# Patient Record
Sex: Female | Born: 1935
Health system: Southern US, Community
[De-identification: ages and names within clinical notes are randomized; demographics above are authoritative.]

## PROBLEM LIST (undated history)

## (undated) DIAGNOSIS — M8580 Other specified disorders of bone density and structure, unspecified site: Secondary | ICD-10-CM

## (undated) DIAGNOSIS — F329 Major depressive disorder, single episode, unspecified: Secondary | ICD-10-CM

## (undated) DIAGNOSIS — K56609 Unspecified intestinal obstruction, unspecified as to partial versus complete obstruction: Secondary | ICD-10-CM

## (undated) DIAGNOSIS — N281 Cyst of kidney, acquired: Secondary | ICD-10-CM

## (undated) DIAGNOSIS — R32 Unspecified urinary incontinence: Secondary | ICD-10-CM

## (undated) DIAGNOSIS — R413 Other amnesia: Secondary | ICD-10-CM

## (undated) DIAGNOSIS — K219 Gastro-esophageal reflux disease without esophagitis: Secondary | ICD-10-CM

## (undated) DIAGNOSIS — J449 Chronic obstructive pulmonary disease, unspecified: Secondary | ICD-10-CM

## (undated) DIAGNOSIS — F32A Depression, unspecified: Secondary | ICD-10-CM

## (undated) DIAGNOSIS — H3530 Unspecified macular degeneration: Secondary | ICD-10-CM

## (undated) DIAGNOSIS — G4700 Insomnia, unspecified: Secondary | ICD-10-CM

## (undated) DIAGNOSIS — G500 Trigeminal neuralgia: Secondary | ICD-10-CM

## (undated) DIAGNOSIS — H269 Unspecified cataract: Secondary | ICD-10-CM

## (undated) DIAGNOSIS — M1990 Unspecified osteoarthritis, unspecified site: Secondary | ICD-10-CM

## (undated) DIAGNOSIS — D126 Benign neoplasm of colon, unspecified: Secondary | ICD-10-CM

## (undated) DIAGNOSIS — M719 Bursopathy, unspecified: Secondary | ICD-10-CM

## (undated) DIAGNOSIS — N3090 Cystitis, unspecified without hematuria: Secondary | ICD-10-CM

## (undated) DIAGNOSIS — T7840XA Allergy, unspecified, initial encounter: Secondary | ICD-10-CM

## (undated) DIAGNOSIS — R001 Bradycardia, unspecified: Secondary | ICD-10-CM

## (undated) DIAGNOSIS — K2970 Gastritis, unspecified, without bleeding: Secondary | ICD-10-CM

## (undated) DIAGNOSIS — E162 Hypoglycemia, unspecified: Secondary | ICD-10-CM

## (undated) DIAGNOSIS — R0602 Shortness of breath: Secondary | ICD-10-CM

## (undated) DIAGNOSIS — Z972 Presence of dental prosthetic device (complete) (partial): Secondary | ICD-10-CM

## (undated) HISTORY — DX: Unspecified osteoarthritis, unspecified site: M19.90

## (undated) HISTORY — DX: Cyst of kidney, acquired: N28.1

## (undated) HISTORY — DX: Benign neoplasm of colon, unspecified: D12.6

## (undated) HISTORY — PX: CHOLECYSTECTOMY: SHX55

## (undated) HISTORY — DX: Gastro-esophageal reflux disease without esophagitis: K21.9

## (undated) HISTORY — DX: Trigeminal neuralgia: G50.0

## (undated) HISTORY — PX: TONSILLECTOMY: SHX5217

## (undated) HISTORY — DX: Depression, unspecified: F32.A

## (undated) HISTORY — DX: Unspecified intestinal obstruction, unspecified as to partial versus complete obstruction: K56.609

## (undated) HISTORY — DX: Major depressive disorder, single episode, unspecified: F32.9

## (undated) HISTORY — DX: Chronic obstructive pulmonary disease, unspecified: J44.9

## (undated) HISTORY — PX: CATARACT EXTRACTION: SUR2

## (undated) HISTORY — DX: Insomnia, unspecified: G47.00

## (undated) HISTORY — PX: ABDOMINAL HYSTERECTOMY: SHX81

## (undated) HISTORY — PX: BREAST SURGERY: SHX581

## (undated) HISTORY — PX: DILATION AND CURETTAGE OF UTERUS: SHX78

## (undated) HISTORY — DX: Allergy, unspecified, initial encounter: T78.40XA

## (undated) HISTORY — PX: JOINT REPLACEMENT: SHX530

## (undated) HISTORY — DX: Gastritis, unspecified, without bleeding: K29.70

## (undated) HISTORY — DX: Unspecified urinary incontinence: R32

## (undated) HISTORY — DX: Unspecified macular degeneration: H35.30

## (undated) HISTORY — PX: EYE SURGERY: SHX253

---

## 1990-04-25 HISTORY — PX: ABDOMINAL HYSTERECTOMY: SHX81

## 1992-04-25 HISTORY — PX: CHOLECYSTECTOMY: SHX55

## 1995-04-26 DIAGNOSIS — D126 Benign neoplasm of colon, unspecified: Secondary | ICD-10-CM

## 1995-04-26 HISTORY — DX: Benign neoplasm of colon, unspecified: D12.6

## 1995-04-26 HISTORY — PX: PARTIAL COLECTOMY: SHX5273

## 1995-05-18 ENCOUNTER — Encounter: Payer: Self-pay | Admitting: Internal Medicine

## 1998-05-28 ENCOUNTER — Other Ambulatory Visit: Admission: RE | Admit: 1998-05-28 | Discharge: 1998-05-28 | Payer: Self-pay | Admitting: Internal Medicine

## 1999-03-05 ENCOUNTER — Inpatient Hospital Stay (HOSPITAL_COMMUNITY): Admission: EM | Admit: 1999-03-05 | Discharge: 1999-03-08 | Payer: Self-pay | Admitting: Emergency Medicine

## 1999-03-05 ENCOUNTER — Encounter: Payer: Self-pay | Admitting: Emergency Medicine

## 1999-06-08 ENCOUNTER — Ambulatory Visit: Admission: RE | Admit: 1999-06-08 | Discharge: 1999-06-08 | Payer: Self-pay | Admitting: Family Medicine

## 2000-03-26 ENCOUNTER — Encounter: Payer: Self-pay | Admitting: Internal Medicine

## 2000-03-26 ENCOUNTER — Inpatient Hospital Stay (HOSPITAL_COMMUNITY): Admission: EM | Admit: 2000-03-26 | Discharge: 2000-03-28 | Payer: Self-pay | Admitting: Internal Medicine

## 2000-03-27 ENCOUNTER — Encounter: Payer: Self-pay | Admitting: Internal Medicine

## 2000-05-23 ENCOUNTER — Encounter: Payer: Self-pay | Admitting: Internal Medicine

## 2000-05-25 ENCOUNTER — Encounter: Payer: Self-pay | Admitting: Internal Medicine

## 2000-05-25 ENCOUNTER — Ambulatory Visit (HOSPITAL_COMMUNITY): Admission: RE | Admit: 2000-05-25 | Discharge: 2000-05-25 | Payer: Self-pay | Admitting: Internal Medicine

## 2002-07-02 ENCOUNTER — Encounter (INDEPENDENT_AMBULATORY_CARE_PROVIDER_SITE_OTHER): Payer: Self-pay | Admitting: *Deleted

## 2002-07-02 ENCOUNTER — Encounter: Admission: RE | Admit: 2002-07-02 | Discharge: 2002-07-02 | Payer: Self-pay | Admitting: General Surgery

## 2002-07-02 ENCOUNTER — Encounter: Payer: Self-pay | Admitting: General Surgery

## 2002-07-15 ENCOUNTER — Encounter: Payer: Self-pay | Admitting: General Surgery

## 2002-07-15 ENCOUNTER — Encounter (INDEPENDENT_AMBULATORY_CARE_PROVIDER_SITE_OTHER): Payer: Self-pay | Admitting: Specialist

## 2002-07-15 ENCOUNTER — Encounter: Admission: RE | Admit: 2002-07-15 | Discharge: 2002-07-15 | Payer: Self-pay | Admitting: General Surgery

## 2003-04-26 HISTORY — PX: JOINT REPLACEMENT: SHX530

## 2003-05-20 ENCOUNTER — Encounter: Payer: Self-pay | Admitting: Internal Medicine

## 2003-06-09 ENCOUNTER — Encounter: Admission: RE | Admit: 2003-06-09 | Discharge: 2003-06-09 | Payer: Self-pay | Admitting: Family Medicine

## 2003-07-23 ENCOUNTER — Encounter: Admission: RE | Admit: 2003-07-23 | Discharge: 2003-07-23 | Payer: Self-pay | Admitting: Family Medicine

## 2003-09-03 ENCOUNTER — Inpatient Hospital Stay (HOSPITAL_COMMUNITY): Admission: RE | Admit: 2003-09-03 | Discharge: 2003-09-08 | Payer: Self-pay | Admitting: Orthopedic Surgery

## 2003-09-08 ENCOUNTER — Inpatient Hospital Stay (HOSPITAL_COMMUNITY)
Admission: RE | Admit: 2003-09-08 | Discharge: 2003-09-12 | Payer: Self-pay | Admitting: Physical Medicine & Rehabilitation

## 2004-03-30 ENCOUNTER — Ambulatory Visit: Payer: Self-pay | Admitting: Family Medicine

## 2004-07-05 ENCOUNTER — Ambulatory Visit: Payer: Self-pay | Admitting: Family Medicine

## 2004-07-08 ENCOUNTER — Ambulatory Visit: Payer: Self-pay | Admitting: Family Medicine

## 2004-08-17 ENCOUNTER — Ambulatory Visit (HOSPITAL_COMMUNITY): Admission: RE | Admit: 2004-08-17 | Discharge: 2004-08-17 | Payer: Self-pay | Admitting: Family Medicine

## 2004-12-14 ENCOUNTER — Encounter: Admission: RE | Admit: 2004-12-14 | Discharge: 2004-12-14 | Payer: Self-pay | Admitting: Family Medicine

## 2004-12-14 ENCOUNTER — Ambulatory Visit: Payer: Self-pay | Admitting: Family Medicine

## 2005-05-16 ENCOUNTER — Ambulatory Visit: Payer: Self-pay | Admitting: Internal Medicine

## 2005-05-30 ENCOUNTER — Ambulatory Visit: Payer: Self-pay | Admitting: Internal Medicine

## 2005-05-30 ENCOUNTER — Encounter: Payer: Self-pay | Admitting: Family Medicine

## 2005-08-22 ENCOUNTER — Ambulatory Visit: Payer: Self-pay | Admitting: Family Medicine

## 2005-08-25 ENCOUNTER — Encounter: Admission: RE | Admit: 2005-08-25 | Discharge: 2005-08-25 | Payer: Self-pay | Admitting: Family Medicine

## 2005-08-26 ENCOUNTER — Ambulatory Visit: Payer: Self-pay | Admitting: Family Medicine

## 2005-09-02 ENCOUNTER — Ambulatory Visit: Payer: Self-pay | Admitting: Family Medicine

## 2005-10-11 ENCOUNTER — Encounter: Admission: RE | Admit: 2005-10-11 | Discharge: 2005-10-11 | Payer: Self-pay | Admitting: Family Medicine

## 2005-10-11 ENCOUNTER — Ambulatory Visit: Payer: Self-pay | Admitting: Family Medicine

## 2005-11-16 ENCOUNTER — Ambulatory Visit: Payer: Self-pay | Admitting: Family Medicine

## 2005-11-24 ENCOUNTER — Encounter: Admission: RE | Admit: 2005-11-24 | Discharge: 2005-11-24 | Payer: Self-pay | Admitting: Family Medicine

## 2006-01-12 ENCOUNTER — Ambulatory Visit: Payer: Self-pay | Admitting: Family Medicine

## 2006-01-13 ENCOUNTER — Ambulatory Visit: Payer: Self-pay | Admitting: Family Medicine

## 2006-01-16 ENCOUNTER — Ambulatory Visit: Payer: Self-pay | Admitting: Family Medicine

## 2006-02-16 ENCOUNTER — Ambulatory Visit: Payer: Self-pay | Admitting: Family Medicine

## 2006-04-12 ENCOUNTER — Ambulatory Visit: Payer: Self-pay | Admitting: Internal Medicine

## 2006-04-16 ENCOUNTER — Emergency Department (HOSPITAL_COMMUNITY): Admission: EM | Admit: 2006-04-16 | Discharge: 2006-04-16 | Payer: Self-pay | Admitting: Emergency Medicine

## 2006-09-04 ENCOUNTER — Ambulatory Visit: Payer: Self-pay | Admitting: Family Medicine

## 2006-09-04 LAB — CONVERTED CEMR LAB
ALT: 22 units/L (ref 0–40)
AST: 24 units/L (ref 0–37)
Albumin: 4 g/dL (ref 3.5–5.2)
Alkaline Phosphatase: 64 units/L (ref 39–117)
BUN: 19 mg/dL (ref 6–23)
Basophils Absolute: 0.1 10*3/uL (ref 0.0–0.1)
Basophils Relative: 1 % (ref 0.0–1.0)
Bilirubin, Direct: 0.1 mg/dL (ref 0.0–0.3)
CO2: 28 meq/L (ref 19–32)
Calcium: 9.8 mg/dL (ref 8.4–10.5)
Chloride: 111 meq/L (ref 96–112)
Cholesterol: 275 mg/dL (ref 0–200)
Creatinine, Ser: 1 mg/dL (ref 0.4–1.2)
Direct LDL: 129.2 mg/dL
Eosinophils Absolute: 0.2 10*3/uL (ref 0.0–0.6)
Eosinophils Relative: 2.1 % (ref 0.0–5.0)
GFR calc Af Amer: 69 mL/min
GFR calc non Af Amer: 57 mL/min
Glucose, Bld: 100 mg/dL — ABNORMAL HIGH (ref 70–99)
HCT: 37.4 % (ref 36.0–46.0)
HDL: 79 mg/dL (ref >39.0)
Hemoglobin: 12.8 g/dL (ref 12.0–15.0)
Lymphocytes Relative: 26 % (ref 12.0–46.0)
MCHC: 34.2 g/dL (ref 30.0–36.0)
MCV: 90.8 fL (ref 78.0–100.0)
Monocytes Absolute: 0.5 10*3/uL (ref 0.2–0.7)
Monocytes Relative: 6.5 % (ref 3.0–11.0)
Neutro Abs: 4.8 10*3/uL (ref 1.4–7.7)
Neutrophils Relative %: 64.4 % (ref 43.0–77.0)
Platelets: 396 10*3/uL (ref 150–400)
Potassium: 4 meq/L (ref 3.5–5.1)
RBC: 4.11 M/uL (ref 3.87–5.11)
RDW: 12.4 % (ref 11.5–14.6)
Sodium: 147 meq/L — ABNORMAL HIGH (ref 135–145)
TSH: 2.51 microintl units/mL (ref 0.35–5.50)
Total Bilirubin: 0.7 mg/dL (ref 0.3–1.2)
Total CHOL/HDL Ratio: 3.5
Total Protein: 7.5 g/dL (ref 6.0–8.3)
Triglycerides: 208 mg/dL (ref 0–149)
VLDL: 42 mg/dL — ABNORMAL HIGH (ref 0–40)
WBC: 7.5 10*3/uL (ref 4.5–10.5)

## 2006-10-03 ENCOUNTER — Encounter: Payer: Self-pay | Admitting: Family Medicine

## 2006-10-03 ENCOUNTER — Encounter: Admission: RE | Admit: 2006-10-03 | Discharge: 2006-10-03 | Payer: Self-pay | Admitting: Family Medicine

## 2006-10-09 ENCOUNTER — Ambulatory Visit: Payer: Self-pay | Admitting: Family Medicine

## 2006-12-21 ENCOUNTER — Ambulatory Visit: Payer: Self-pay | Admitting: Family Medicine

## 2007-06-18 ENCOUNTER — Telehealth: Payer: Self-pay | Admitting: Family Medicine

## 2007-06-19 ENCOUNTER — Telehealth: Payer: Self-pay | Admitting: Family Medicine

## 2007-09-03 ENCOUNTER — Ambulatory Visit: Payer: Self-pay | Admitting: Family Medicine

## 2007-09-03 DIAGNOSIS — J449 Chronic obstructive pulmonary disease, unspecified: Secondary | ICD-10-CM | POA: Insufficient documentation

## 2007-09-03 DIAGNOSIS — F329 Major depressive disorder, single episode, unspecified: Secondary | ICD-10-CM

## 2007-09-03 DIAGNOSIS — K219 Gastro-esophageal reflux disease without esophagitis: Secondary | ICD-10-CM | POA: Insufficient documentation

## 2007-09-03 DIAGNOSIS — M1990 Unspecified osteoarthritis, unspecified site: Secondary | ICD-10-CM

## 2007-09-03 DIAGNOSIS — J309 Allergic rhinitis, unspecified: Secondary | ICD-10-CM

## 2007-09-03 LAB — CONVERTED CEMR LAB
AST: 20 units/L (ref 0–37)
Alkaline Phosphatase: 60 units/L (ref 39–117)
Basophils Absolute: 0.1 10*3/uL (ref 0.0–0.1)
Chloride: 110 meq/L (ref 96–112)
Eosinophils Absolute: 0.2 10*3/uL (ref 0.0–0.7)
Eosinophils Relative: 2 % (ref 0.0–5.0)
GFR calc non Af Amer: 51 mL/min
HDL: 84 mg/dL (ref >39.0)
MCHC: 33.7 g/dL (ref 30.0–36.0)
MCV: 92.4 fL (ref 78.0–100.0)
Neutrophils Relative %: 62.7 % (ref 43.0–77.0)
Platelets: 397 10*3/uL (ref 150–400)
Potassium: 4.4 meq/L (ref 3.5–5.1)
RDW: 13.2 % (ref 11.5–14.6)
Sodium: 142 meq/L (ref 135–145)
Total Bilirubin: 0.9 mg/dL (ref 0.3–1.2)
Triglycerides: 138 mg/dL (ref 0–149)
WBC: 8.5 10*3/uL (ref 4.5–10.5)

## 2007-09-24 ENCOUNTER — Telehealth: Payer: Self-pay | Admitting: Family Medicine

## 2007-09-27 ENCOUNTER — Encounter: Payer: Self-pay | Admitting: *Deleted

## 2007-09-27 ENCOUNTER — Telehealth: Payer: Self-pay | Admitting: *Deleted

## 2007-10-31 ENCOUNTER — Encounter: Admission: RE | Admit: 2007-10-31 | Discharge: 2007-10-31 | Payer: Self-pay | Admitting: Orthopaedic Surgery

## 2007-11-12 ENCOUNTER — Telehealth: Payer: Self-pay | Admitting: Family Medicine

## 2007-11-21 ENCOUNTER — Telehealth: Payer: Self-pay | Admitting: Family Medicine

## 2008-01-07 DIAGNOSIS — G500 Trigeminal neuralgia: Secondary | ICD-10-CM | POA: Insufficient documentation

## 2008-01-10 ENCOUNTER — Ambulatory Visit: Payer: Self-pay | Admitting: Family Medicine

## 2008-02-23 ENCOUNTER — Ambulatory Visit: Payer: Self-pay | Admitting: Family Medicine

## 2008-02-23 ENCOUNTER — Telehealth: Payer: Self-pay | Admitting: Family Medicine

## 2008-02-23 DIAGNOSIS — N390 Urinary tract infection, site not specified: Secondary | ICD-10-CM

## 2008-02-23 LAB — CONVERTED CEMR LAB
Bilirubin Urine: NEGATIVE
Glucose, Urine, Semiquant: NEGATIVE
Ketones, urine, test strip: NEGATIVE
Protein, U semiquant: 100
Specific Gravity, Urine: 1.015
Urobilinogen, UA: 0.2

## 2008-02-27 ENCOUNTER — Telehealth: Payer: Self-pay | Admitting: Family Medicine

## 2008-03-10 ENCOUNTER — Telehealth: Payer: Self-pay | Admitting: Internal Medicine

## 2008-03-13 ENCOUNTER — Encounter: Admission: RE | Admit: 2008-03-13 | Discharge: 2008-03-13 | Payer: Self-pay | Admitting: Neurosurgery

## 2008-03-31 ENCOUNTER — Telehealth: Payer: Self-pay | Admitting: *Deleted

## 2008-04-01 ENCOUNTER — Telehealth: Payer: Self-pay | Admitting: Family Medicine

## 2008-05-20 ENCOUNTER — Inpatient Hospital Stay (HOSPITAL_COMMUNITY): Admission: RE | Admit: 2008-05-20 | Discharge: 2008-05-22 | Payer: Self-pay | Admitting: Neurosurgery

## 2008-05-20 HISTORY — PX: BACK SURGERY: SHX140

## 2008-05-29 ENCOUNTER — Emergency Department (HOSPITAL_COMMUNITY): Admission: EM | Admit: 2008-05-29 | Discharge: 2008-05-29 | Payer: Self-pay | Admitting: *Deleted

## 2008-07-09 ENCOUNTER — Encounter: Admission: RE | Admit: 2008-07-09 | Discharge: 2008-07-09 | Payer: Self-pay | Admitting: Neurosurgery

## 2008-07-14 ENCOUNTER — Telehealth: Payer: Self-pay | Admitting: Family Medicine

## 2008-07-17 ENCOUNTER — Encounter (INDEPENDENT_AMBULATORY_CARE_PROVIDER_SITE_OTHER): Payer: Self-pay | Admitting: *Deleted

## 2008-07-17 ENCOUNTER — Emergency Department (HOSPITAL_COMMUNITY): Admission: EM | Admit: 2008-07-17 | Discharge: 2008-07-17 | Payer: Self-pay | Admitting: Emergency Medicine

## 2008-07-20 ENCOUNTER — Emergency Department (HOSPITAL_COMMUNITY): Admission: EM | Admit: 2008-07-20 | Discharge: 2008-07-20 | Payer: Self-pay | Admitting: Emergency Medicine

## 2008-07-20 ENCOUNTER — Encounter (INDEPENDENT_AMBULATORY_CARE_PROVIDER_SITE_OTHER): Payer: Self-pay | Admitting: *Deleted

## 2008-07-21 ENCOUNTER — Telehealth: Payer: Self-pay | Admitting: Internal Medicine

## 2008-07-22 ENCOUNTER — Ambulatory Visit (HOSPITAL_COMMUNITY): Admission: RE | Admit: 2008-07-22 | Discharge: 2008-07-22 | Payer: Self-pay | Admitting: Internal Medicine

## 2008-07-22 DIAGNOSIS — K5900 Constipation, unspecified: Secondary | ICD-10-CM

## 2008-08-07 DIAGNOSIS — H3530 Unspecified macular degeneration: Secondary | ICD-10-CM | POA: Insufficient documentation

## 2008-08-07 DIAGNOSIS — N281 Cyst of kidney, acquired: Secondary | ICD-10-CM | POA: Insufficient documentation

## 2008-08-07 DIAGNOSIS — Z8719 Personal history of other diseases of the digestive system: Secondary | ICD-10-CM

## 2008-08-07 DIAGNOSIS — M129 Arthropathy, unspecified: Secondary | ICD-10-CM | POA: Insufficient documentation

## 2008-08-07 DIAGNOSIS — H409 Unspecified glaucoma: Secondary | ICD-10-CM

## 2008-08-07 DIAGNOSIS — Z85038 Personal history of other malignant neoplasm of large intestine: Secondary | ICD-10-CM | POA: Insufficient documentation

## 2008-08-07 DIAGNOSIS — G4700 Insomnia, unspecified: Secondary | ICD-10-CM | POA: Insufficient documentation

## 2008-08-11 ENCOUNTER — Ambulatory Visit: Payer: Self-pay | Admitting: Internal Medicine

## 2008-10-09 ENCOUNTER — Ambulatory Visit: Payer: Self-pay | Admitting: Family Medicine

## 2008-10-09 LAB — CONVERTED CEMR LAB
ALT: 14 units/L (ref 0–35)
AST: 22 units/L (ref 0–37)
Albumin: 3.8 g/dL (ref 3.5–5.2)
Alkaline Phosphatase: 64 units/L (ref 39–117)
Basophils Relative: 0.9 % (ref 0.0–3.0)
Bilirubin Urine: NEGATIVE
Bilirubin, Direct: 0.1 mg/dL (ref 0.0–0.3)
Blood in Urine, dipstick: NEGATIVE
CO2: 26 meq/L (ref 19–32)
Calcium: 9.6 mg/dL (ref 8.4–10.5)
Creatinine, Ser: 1 mg/dL (ref 0.4–1.2)
Eosinophils Absolute: 0.2 10*3/uL (ref 0.0–0.7)
Eosinophils Relative: 1.6 % (ref 0.0–5.0)
Glucose, Urine, Semiquant: NEGATIVE
Hemoglobin: 12.5 g/dL (ref 12.0–15.0)
Lymphocytes Relative: 23.4 % (ref 12.0–46.0)
MCHC: 34.1 g/dL (ref 30.0–36.0)
Monocytes Relative: 6.7 % (ref 3.0–12.0)
Neutro Abs: 6.7 10*3/uL (ref 1.4–7.7)
Neutrophils Relative %: 67.4 % (ref 43.0–77.0)
RBC: 4.08 M/uL (ref 3.87–5.11)
Sodium: 142 meq/L (ref 135–145)
Specific Gravity, Urine: 1.025
Total Protein: 7.3 g/dL (ref 6.0–8.3)
WBC: 10 10*3/uL (ref 4.5–10.5)
pH: 5.5

## 2008-12-24 HISTORY — PX: SHOULDER SURGERY: SHX246

## 2009-01-22 HISTORY — PX: SHOULDER SURGERY: SHX246

## 2009-01-23 ENCOUNTER — Inpatient Hospital Stay (HOSPITAL_COMMUNITY): Admission: RE | Admit: 2009-01-23 | Discharge: 2009-01-25 | Payer: Self-pay | Admitting: Orthopedic Surgery

## 2009-02-16 ENCOUNTER — Ambulatory Visit: Payer: Self-pay | Admitting: Internal Medicine

## 2009-03-05 ENCOUNTER — Ambulatory Visit: Payer: Self-pay | Admitting: Family Medicine

## 2009-03-06 ENCOUNTER — Telehealth: Payer: Self-pay | Admitting: Gastroenterology

## 2009-03-09 ENCOUNTER — Telehealth: Payer: Self-pay | Admitting: Family Medicine

## 2009-03-09 ENCOUNTER — Ambulatory Visit: Payer: Self-pay | Admitting: Gastroenterology

## 2009-03-09 ENCOUNTER — Encounter: Payer: Self-pay | Admitting: Internal Medicine

## 2009-03-09 DIAGNOSIS — Z860100 Personal history of colon polyps, unspecified: Secondary | ICD-10-CM | POA: Insufficient documentation

## 2009-03-09 DIAGNOSIS — R110 Nausea: Secondary | ICD-10-CM

## 2009-03-09 DIAGNOSIS — R634 Abnormal weight loss: Secondary | ICD-10-CM | POA: Insufficient documentation

## 2009-03-09 LAB — CONVERTED CEMR LAB
AST: 19 units/L (ref 0–37)
Amylase: 85 units/L (ref 27–131)
BUN: 17 mg/dL (ref 6–23)
Basophils Absolute: 0.1 10*3/uL (ref 0.0–0.1)
Bilirubin, Direct: 0 mg/dL (ref 0.0–0.3)
Calcium: 10.1 mg/dL (ref 8.4–10.5)
Creatinine, Ser: 1 mg/dL (ref 0.4–1.2)
Eosinophils Relative: 0.9 % (ref 0.0–5.0)
GFR calc non Af Amer: 56.54 mL/min (ref >60)
Glucose, Bld: 95 mg/dL (ref 70–99)
H Pylori IgG: NEGATIVE
HCT: 38.8 % (ref 36.0–46.0)
Lymphs Abs: 2.2 10*3/uL (ref 0.7–4.0)
Monocytes Absolute: 0.5 10*3/uL (ref 0.1–1.0)
Monocytes Relative: 5.1 % (ref 3.0–12.0)
Neutrophils Relative %: 72 % (ref 43.0–77.0)
Platelets: 364 10*3/uL (ref 150.0–400.0)
Potassium: 4.3 meq/L (ref 3.5–5.1)
RDW: 12.9 % (ref 11.5–14.6)
TSH: 2.3 microintl units/mL (ref 0.35–5.50)
Total Bilirubin: 0.5 mg/dL (ref 0.3–1.2)
WBC: 10.5 10*3/uL (ref 4.5–10.5)

## 2009-03-10 ENCOUNTER — Ambulatory Visit: Payer: Self-pay | Admitting: Internal Medicine

## 2009-03-24 ENCOUNTER — Ambulatory Visit: Payer: Self-pay | Admitting: Internal Medicine

## 2010-02-08 ENCOUNTER — Encounter: Admission: RE | Admit: 2010-02-08 | Discharge: 2010-02-08 | Payer: Self-pay | Admitting: Orthopedic Surgery

## 2010-02-19 ENCOUNTER — Ambulatory Visit: Payer: Self-pay | Admitting: Family Medicine

## 2010-05-25 NOTE — Assessment & Plan Note (Signed)
Summary: flu shot/cjr/PT RESCD//CCM   Allergies: 1)  ! Phenergan 2)  ! Codeine 3)  ! Sulfa 4)  ! * Tizandine 5)  ! * Lyrica  Review of Systems       Flu Vaccine Consent Questions     Do you have a history of severe allergic reactions to this vaccine? no    Any prior history of allergic reactions to egg and/or gelatin? no    Do you have a sensitivity to the preservative Thimersol? no    Do you have a past history of Guillan-Barre Syndrome? no    Do you currently have an acute febrile illness? no    Have you ever had a severe reaction to latex? no    Vaccine information given and explained to patient? yes    Are you currently pregnant? no    Lot Number:[id number]   Exp Date:10/23/2010   Site Given  Left Deltoid IM    Complete Medication List: 1)  Lovastatin 10 Mg Tabs (Lovastatin) .... Once daily 2)  Qvar 80 Mcg/act Aers (Beclomethasone dipropionate) .... Two times a day 3)  Premarin 0.625 Mg/gm Crea (Estrogens, conjugated) .... Apply once daily 4)  Retin-a 0.025 % Crea (Tretinoin) .... Use every 3rd day 5)  Travatan 0.004 % Soln (Travoprost) .... One drop once daily 6)  Azopt 1 % Susp (Brinzolamide) .... One two times a day 7)  Tretinoin 0.05 % Crea (Tretinoin) .... Apply a small amount to affected area at bedtime 8)  Prilosec 20 Mg Cpdr (Omeprazole) .Marland Kitchen.. 1 by mouth once daily 9)  Oxytrol 3.9 Mg/24hr Pttw (Oxybutynin) .Marland Kitchen.. 1 patch every 3rd day 10)  Neurontin 100 Mg Caps (Gabapentin) .Marland Kitchen.. 1 once daily 11)  Lexapro 10 Mg Tabs (Escitalopram oxalate) .... One daily 12)  Ambien 10 Mg Tabs (Zolpidem tartrate) .... Take one tab at bedtime 13)  Gaviscon 80-14.2 Mg Chew (Alum hydroxide-mag trisilicate) .... Chew one by mouth as needed 14)  Megace Oral 40 Mg/ml Susp (Megestrol acetate) .... Take 5 cc (1 teaspoon) by mouth once daily  Other Orders: Flu Vaccine 52yrs + MEDICARE PATIENTS [id number]) Administration Flu vaccine - MCR (G0008)   Orders Added: 1)  Flu Vaccine 60yrs + MEDICARE  PATIENTS [Q2039] 2)  Administration Flu vaccine - MCR [id number]

## 2010-06-09 ENCOUNTER — Encounter: Payer: Self-pay | Admitting: Internal Medicine

## 2010-06-16 NOTE — Letter (Signed)
Summary: Colonoscopy Letter  [facility]  [place], [place] [address]   Phone: [phone]  Fax: [phone]      June 09, 2010 MRN: [id number]   [name], [place]  [id number]   Dear [name],   According to your medical record, it is time for you to schedule a Colonoscopy. The American Cancer Society recommends this procedure as a method to detect early colon cancer. Patients with a family history of colon cancer, or a personal history of colon polyps or inflammatory bowel disease are at increased risk.  This letter has been generated based on the recommendations made at the time of your procedure. If you feel that in your particular situation this may no longer apply, please contact our office.  Please call our office at [phone] to schedule this appointment or to update your records at your earliest convenience.  Thank you for cooperating with [place] to provide you with the very best care possible.   Sincerely,  Lowella Bandy. Olevia Perches, M.D.  [facility] [phone]

## 2010-07-26 ENCOUNTER — Other Ambulatory Visit: Payer: Self-pay | Admitting: Family Medicine

## 2010-07-30 LAB — URINE MICROSCOPIC-ADD ON

## 2010-07-30 LAB — COMPREHENSIVE METABOLIC PANEL
ALT: 18 U/L (ref 0–35)
AST: 24 U/L (ref 0–37)
Albumin: 4 g/dL (ref 3.5–5.2)
Alkaline Phosphatase: 67 U/L (ref 39–117)
CO2: 26 mEq/L (ref 19–32)
Chloride: 105 mEq/L (ref 96–112)
GFR calc Af Amer: >60 mL/min (ref >60)
GFR calc non Af Amer: 54 mL/min — ABNORMAL LOW (ref >60)
Potassium: 4.5 mEq/L (ref 3.5–5.1)
Total Bilirubin: 0.7 mg/dL (ref 0.3–1.2)

## 2010-07-30 LAB — URINALYSIS, ROUTINE W REFLEX MICROSCOPIC
Bilirubin Urine: NEGATIVE
Glucose, UA: NEGATIVE mg/dL
Hgb urine dipstick: NEGATIVE
Nitrite: NEGATIVE
Specific Gravity, Urine: 1.021 (ref 1.005–1.030)
pH: 6 (ref 5.0–8.0)

## 2010-07-30 LAB — CBC
Platelets: 347 10*3/uL (ref 150–400)
RBC: 4.25 MIL/uL (ref 3.87–5.11)
WBC: 8.5 10*3/uL (ref 4.0–10.5)

## 2010-08-05 LAB — CBC
HCT: 40.5 % (ref 36.0–46.0)
HCT: 42.1 % (ref 36.0–46.0)
Hemoglobin: 13.2 g/dL (ref 12.0–15.0)
MCV: 89.8 fL (ref 78.0–100.0)
Platelets: 270 10*3/uL (ref 150–400)
RDW: 14.8 % (ref 11.5–15.5)
RDW: 15.2 % (ref 11.5–15.5)

## 2010-08-05 LAB — DIFFERENTIAL
Basophils Absolute: 0.1 10*3/uL (ref 0.0–0.1)
Basophils Absolute: 0.1 10*3/uL (ref 0.0–0.1)
Basophils Relative: 1 % (ref 0–1)
Basophils Relative: 1 % (ref 0–1)
Eosinophils Absolute: 0 10*3/uL (ref 0.0–0.7)
Eosinophils Relative: 0 % (ref 0–5)
Lymphocytes Relative: 22 % (ref 12–46)
Neutro Abs: 10.2 10*3/uL — ABNORMAL HIGH (ref 1.7–7.7)
Neutrophils Relative %: 74 % (ref 43–77)

## 2010-08-05 LAB — URINALYSIS, ROUTINE W REFLEX MICROSCOPIC
Hgb urine dipstick: NEGATIVE
Nitrite: NEGATIVE
Protein, ur: NEGATIVE mg/dL
Protein, ur: NEGATIVE mg/dL
Urobilinogen, UA: 0.2 mg/dL (ref 0.0–1.0)
Urobilinogen, UA: 0.2 mg/dL (ref 0.0–1.0)

## 2010-08-05 LAB — COMPREHENSIVE METABOLIC PANEL
Alkaline Phosphatase: 58 U/L (ref 39–117)
BUN: 22 mg/dL (ref 6–23)
Chloride: 104 mEq/L (ref 96–112)
Glucose, Bld: 102 mg/dL — ABNORMAL HIGH (ref 70–99)
Potassium: 3.8 mEq/L (ref 3.5–5.1)
Total Bilirubin: 0.7 mg/dL (ref 0.3–1.2)

## 2010-08-05 LAB — BASIC METABOLIC PANEL
BUN: 18 mg/dL (ref 6–23)
GFR calc non Af Amer: 54 mL/min — ABNORMAL LOW (ref >60)
Glucose, Bld: 94 mg/dL (ref 70–99)
Potassium: 4.4 mEq/L (ref 3.5–5.1)

## 2010-08-05 LAB — URINE CULTURE

## 2010-08-05 LAB — URINE MICROSCOPIC-ADD ON

## 2010-08-09 LAB — CBC
Platelets: 374 10*3/uL (ref 150–400)
WBC: 8.3 10*3/uL (ref 4.0–10.5)

## 2010-08-09 LAB — TYPE AND SCREEN: ABO/RH(D): A POS

## 2010-08-09 LAB — ABO/RH: ABO/RH(D): A POS

## 2010-08-10 LAB — URINALYSIS, ROUTINE W REFLEX MICROSCOPIC
Bilirubin Urine: NEGATIVE
Glucose, UA: NEGATIVE mg/dL
Hgb urine dipstick: NEGATIVE
Ketones, ur: 15 mg/dL — AB
Nitrite: NEGATIVE
Protein, ur: NEGATIVE mg/dL
Specific Gravity, Urine: 1.02 (ref 1.005–1.030)
Urobilinogen, UA: 0.2 mg/dL (ref 0.0–1.0)
pH: 7.5 (ref 5.0–8.0)

## 2010-08-10 LAB — DIFFERENTIAL
Basophils Absolute: 0.1 10*3/uL (ref 0.0–0.1)
Basophils Relative: 1 % (ref 0–1)
Eosinophils Relative: 1 % (ref 0–5)
Lymphocytes Relative: 14 % (ref 12–46)
Neutro Abs: 6.8 10*3/uL (ref 1.7–7.7)

## 2010-08-10 LAB — URINE MICROSCOPIC-ADD ON

## 2010-08-10 LAB — POCT I-STAT, CHEM 8
BUN: 15 mg/dL (ref 6–23)
Calcium, Ion: 1.11 mmol/L — ABNORMAL LOW (ref 1.12–1.32)
Chloride: 105 meq/L (ref 96–112)
Creatinine, Ser: 0.9 mg/dL (ref 0.4–1.2)
Glucose, Bld: 115 mg/dL — ABNORMAL HIGH (ref 70–99)
HCT: 36 % (ref 36.0–46.0)
Hemoglobin: 12.2 g/dL (ref 12.0–15.0)
Potassium: 3.8 meq/L (ref 3.5–5.1)
Sodium: 139 meq/L (ref 135–145)
TCO2: 24 mmol/L (ref 0–100)

## 2010-08-10 LAB — URINE CULTURE: Colony Count: NO GROWTH

## 2010-08-10 LAB — CBC
Platelets: 475 10*3/uL — ABNORMAL HIGH (ref 150–400)
RDW: 12.4 % (ref 11.5–15.5)

## 2010-08-18 ENCOUNTER — Encounter: Payer: Self-pay | Admitting: Family Medicine

## 2010-08-18 ENCOUNTER — Ambulatory Visit (INDEPENDENT_AMBULATORY_CARE_PROVIDER_SITE_OTHER): Payer: Medicare Other | Admitting: Family Medicine

## 2010-08-18 VITALS — BP 120/70 | Ht 61.0 in | Wt 122.0 lb

## 2010-08-18 DIAGNOSIS — N390 Urinary tract infection, site not specified: Secondary | ICD-10-CM

## 2010-08-18 DIAGNOSIS — R300 Dysuria: Secondary | ICD-10-CM

## 2010-08-18 DIAGNOSIS — N952 Postmenopausal atrophic vaginitis: Secondary | ICD-10-CM

## 2010-08-18 DIAGNOSIS — F329 Major depressive disorder, single episode, unspecified: Secondary | ICD-10-CM

## 2010-08-18 DIAGNOSIS — N3090 Cystitis, unspecified without hematuria: Secondary | ICD-10-CM

## 2010-08-18 DIAGNOSIS — R52 Pain, unspecified: Secondary | ICD-10-CM

## 2010-08-18 LAB — POCT URINALYSIS DIPSTICK
Bilirubin, UA: NEGATIVE
Glucose, UA: NEGATIVE
Spec Grav, UA: 1.015

## 2010-08-18 MED ORDER — CIPROFLOXACIN HCL 500 MG PO TABS: 500.0000 mg | ORAL_TABLET | Freq: Two times a day (BID) | ORAL | Status: AC

## 2010-08-18 MED ORDER — ESTROGENS CONJUGATED 0.625 MG PO TABS: ORAL_TABLET | ORAL | Status: DC

## 2010-08-18 NOTE — Patient Instructions (Signed)
Begin the Cipro, take one twice daily until the bottle is empty.  Start the Premarin one daily at bedtime tonight.  Call your orthopedist, Dr. Onnie Graham, and have them refer you to the pain clinic.  Return p.r.n..  In the meantime, I would take a half of a Vicodin 3 times daily to try to help control your pain

## 2010-08-18 NOTE — Progress Notes (Signed)
  Subjective:    Patient ID: [name], female    DOB: July 11, 1925, 77 y.o.   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  Four days ago she began having urinary symptoms of dysuria and frequency.  No fever, chills, or back pain.  She's tried drinking lots of water however, the symptoms persist.  Her last UTI was 3 years ago.  She, states she's been seeing Dr. Serafina Royals who has reconstructed.  Her left shoulder, and she needs surgery on her right shoulder.  She's had chronic pain in her shoulder.  She's also been seeing Dr. Vertell Limber for evaluation of her back.  He gave her tramadol, however, she had side effects from that medication.  She's taken Vicodin, one tablet twice daily as needed.  However, her pains, not under good control.  Because of poor pain control.  She is anxious and wants some medicine for anxiety.  Unexplained I would get her pain under good control first before taking any other medication.  Advised her to call Dr. Onnie Graham and have them refer her to the pain clinic.  She also continues to have hot flashes nightly, and can't sleep she wants to go back on her Premarin knowing the potential side effects.  She states she is 77 years old and does not worry about the down side of chronic hormone replacement therapy   Review of Systems    General and neurologic review of systems otherwise negative Objective:   Physical Exam    Well-developed thin, female, in no acute distress.  Examination the abdomen is negative.  UA shows large blood, white cells, and nitrate    Assessment & Plan:  ,Cystitis,,,,,,,, Septra one twice daily for one week, then one at bedtime for two weeks.  Chronic pain,,,,,,,,, asked her to call her orthopedist, and get her set up at the pain clinic.  Hot flashes restart Premarin .625 nightly

## 2010-08-30 ENCOUNTER — Ambulatory Visit (HOSPITAL_COMMUNITY)
Admission: RE | Admit: 2010-08-30 | Discharge: 2010-08-30 | Disposition: A | Discharge status: Home / Self Care | Payer: Medicare Other | Source: Ambulatory Visit | Attending: Ophthalmology | Admitting: Ophthalmology

## 2010-08-30 ENCOUNTER — Ambulatory Visit (HOSPITAL_COMMUNITY): Payer: Medicare Other

## 2010-08-30 DIAGNOSIS — M549 Dorsalgia, unspecified: Secondary | ICD-10-CM | POA: Insufficient documentation

## 2010-08-30 DIAGNOSIS — J449 Chronic obstructive pulmonary disease, unspecified: Secondary | ICD-10-CM | POA: Insufficient documentation

## 2010-08-30 DIAGNOSIS — Z79899 Other long term (current) drug therapy: Secondary | ICD-10-CM | POA: Insufficient documentation

## 2010-08-30 DIAGNOSIS — Z01811 Encounter for preprocedural respiratory examination: Secondary | ICD-10-CM

## 2010-08-30 DIAGNOSIS — Z87891 Personal history of nicotine dependence: Secondary | ICD-10-CM | POA: Insufficient documentation

## 2010-08-30 DIAGNOSIS — H3560 Retinal hemorrhage, unspecified eye: Secondary | ICD-10-CM | POA: Insufficient documentation

## 2010-08-30 DIAGNOSIS — Z01818 Encounter for other preprocedural examination: Secondary | ICD-10-CM | POA: Insufficient documentation

## 2010-08-30 DIAGNOSIS — Z01810 Encounter for preprocedural cardiovascular examination: Secondary | ICD-10-CM | POA: Insufficient documentation

## 2010-08-30 DIAGNOSIS — Z01812 Encounter for preprocedural laboratory examination: Secondary | ICD-10-CM | POA: Insufficient documentation

## 2010-08-30 DIAGNOSIS — H3530 Unspecified macular degeneration: Secondary | ICD-10-CM | POA: Insufficient documentation

## 2010-08-30 DIAGNOSIS — J4489 Other specified chronic obstructive pulmonary disease: Secondary | ICD-10-CM | POA: Insufficient documentation

## 2010-08-30 DIAGNOSIS — G8929 Other chronic pain: Secondary | ICD-10-CM | POA: Insufficient documentation

## 2010-08-30 DIAGNOSIS — K219 Gastro-esophageal reflux disease without esophagitis: Secondary | ICD-10-CM | POA: Insufficient documentation

## 2010-08-30 LAB — CBC
HCT: 35.3 % — ABNORMAL LOW (ref 36.0–46.0)
Hemoglobin: 11.8 g/dL — ABNORMAL LOW (ref 12.0–15.0)
MCV: 88 fL (ref 78.0–100.0)
RBC: 4.01 MIL/uL (ref 3.87–5.11)
WBC: 10.3 10*3/uL (ref 4.0–10.5)

## 2010-08-30 LAB — BASIC METABOLIC PANEL
BUN: 15 mg/dL (ref 6–23)
Chloride: 104 mEq/L (ref 96–112)
Glucose, Bld: 93 mg/dL (ref 70–99)
Potassium: 3.9 mEq/L (ref 3.5–5.1)

## 2010-08-30 LAB — SURGICAL PCR SCREEN: MRSA, PCR: NEGATIVE

## 2010-09-07 NOTE — Op Note (Signed)
[name], [name]              ACCOUNT NO.:  [id number]   MEDICAL RECORD NO.:  [id number]          PATIENT TYPE:  INP   LOCATION:  3041                         FACILITY:  [facility]   PHYSICIAN:  Marchia Meiers. Vertell Limber, M.D.  DATE OF BIRTH:  [birth year]   DATE OF PROCEDURE:  05/20/2008  DATE OF DISCHARGE:                               OPERATIVE REPORT   PREOPERATIVE DIAGNOSES:  1. Lumbar scoliosis, L3-4 with spondylosis.  2. Degenerative disk disease and radiculopathy L3-4 and L4-5 levels.   POSTOPERATIVE DIAGNOSES:  1. Lumbar scoliosis, L3-4 with spondylosis.  2. Degenerative disk disease and radiculopathy L3-4 and L4-5 levels.   PROCEDURES:  Anterolateral decompression and fusion, L3-4 with  diskectomy.  Interbody grafting with Osteocel and PEEK interbody cage with lateral  plating L3 through L4 level with attempted decompression at L4-5.   SURGEON:  Marchia Meiers. Vertell Limber, MD   ASSISTANTS:  1. Verdis Prime, RN  2. Ophelia Charter, MD   ANESTHESIA:  General endotracheal anesthesia.   ESTIMATED BLOOD LOSS:  Minimal.   COMPLICATIONS:  None.   DISPOSITION:  Recovery.   INDICATIONS:  [name] is an 77 year old woman with severe  scoliosis focally at L3-4 with significant spondylosis and disk  degeneration at L3-4 and L4-5 levels.  It was elected to take her to  surgery for anterolateral decompression and fusion at the L3-4 level and  L4-5 levels.   PROCEDURE IN DETAIL:  [name] was brought to the operating room.  Following a satisfactory and uncomplicated induction of general  endotracheal anesthesia plus intravenous lines and Foley catheter, the  patient was placed in a right lateral decubitus position.  An axillary  roll was placed and AP and lateral fluoroscopy were then utilized to  localize on the L3-4 and L4-5 levels.  A tape was placed over her hip  with a padding and also over a chest.  After the patient was positioned  properly, her hip and leg were then bent and  padded and taped  appropriately.  Electrodes were placed in the lower extremity myotomes  bilaterally.  The planned incisions were then marked overlying the L3-4  and L4-5 interspace.  Flank and back were then prepped and draped in  usual sterile fashion.  An incision was made laterally overlying L3-4  level and a posterior finger dissection incision was made between the L3-  4 and L4-5 levels.  Using finger dissection, the retroperitoneal space  was entered.  This was then connected to the L3-4 incision.  The XLIF  retractor system was then deployed after careful neuro monitoring was  performed without evidence of any nerve stimulation.  After sequential  dilators were used, a K-wire was inserted in the interspace and position  was confirmed by AP and lateral fluoroscopy.  It was placed just  slightly posterior to the midline.  The retractor was then deployed and  opened and the neural stimulation was performed along the psoas muscle  posteriorly without evidence of any neural stimulation.  The shim was  then deployed and the thorough diskectomy was performed.  The interspace  was very collapsed  on the right-sided L3-4 level and this was opened up  and endplates were stripped of residual disk material and using  fluoroscopy, the thorough decompression was confirmed and decompression  across through the lateral annulus at L3-4.  After a trial sizing, an 8-  mm PEEK cage was selected, packed with Osteocel inserted and countersunk  appropriately with good restoration vertebral disk height.  The initial  cage fracture disk was then replaced and new cage was placed.  A lateral  plate was then placed with 45 mm x 5.5 mm screws at L3 and L4 and the  locking caps were placed.  Final x-ray demonstrated well-positioned  interbody graft and screws.  Attention was then turned to the L4-5 level  where the dilator was inserted overlying the psoas and on initial neural  mapping, it was found that there  was high stimulation with very low  voltage stimulation posteriorly.  The dilator was then moved anteriorly  to just anterior to the midline and even with this positioning, there  was fairly high stimulation along the nerve indicating proximity of the  neural elements.  After additional efforts were made to reposition the  dilating probe, it was elected to not to perform diskectomy and  interbody graft at the L4-5 level because of unfavorable neural anatomy.  It was therefore elected not to perform that portion of the procedure.  The wounds were irrigated and closed with 0 and 2-0 Vicryls and 3-0  Vicryl subcuticular stitch reapproximated the skin edges.  Wounds were  dressed with Dermabond.  The patient was extubated in the operating room  and taken to the recovery room in stable and satisfactory condition  having tolerated the operation well.  Counts were correct at the end of  the case.      Marchia Meiers. Vertell Limber, M.D.  Electronically Signed     JDS/MEDQ  D:  05/20/2008  T:  05/21/2008  Job:  [id number]

## 2010-09-10 NOTE — H&P (Signed)
[name], [name]                        ACCOUNT NO.:  [id number]   MEDICAL RECORD NO.:  [id number]                   PATIENT TYPE:  INP   LOCATION:  2899                                 FACILITY:  [facility]   PHYSICIAN:  Newt Minion, M.D.                DATE OF BIRTH:  Dec 20, 1925   DATE OF ADMISSION:  09/03/2003  DATE OF DISCHARGE:                                HISTORY & PHYSICAL   HISTORY OF PRESENT ILLNESS:  The patient is a 77 year old woman with  osteoarthritis of her left hip.  The patient has failed conservative care  and wishes to proceed with total hip arthroplasty due to inability to  perform her activities of daily living.   ALLERGIES:  1. PHENERGAN, which causes muscle spasm.  2. She states that she is allergic to TAPE.   MEDICATIONS:  1. Elavil 10 mg q.h.s.  2. Aspirin 81 mg q.h.s.  3. Lovastatin 10 mg q. day.  4. Lexapro 10 mg q. day.  5. Ranitidine 150 mg b.i.d.  6. Advair inhaler disc twice a day.   PAST MEDICAL HISTORY:  1. Significant for hysterectomy in 1992.  2. Cholecystectomy in 1994.  3. Colectomy in 1997.   SOCIAL HISTORY:  Negative for tobacco.  Negative for alcohol.  She is  widowed.   FAMILY HISTORY:  Positive for colon cancer.   REVIEW OF SYSTEMS:  Positive for arthritis, history of GI bleed and cancer.   PHYSICAL EXAMINATION:  VITAL SIGNS:  Temperature 97; pulse 68; respiratory  rate 12; blood pressure 122/60; height 5 foot 1 inch; weight 116 pounds.  GENERAL:  She is in no acute distress.  NECK:  Supple.  No bruits.  LUNGS:  Clear to auscultation.  CARDIOVASCULAR:  Regular rate and rhythm.  EXTREMITIES:  Examination of her left lower extremity shows she does have an  abductor lurch with ambulation.  She has internal rotation to 0 degrees;  external rotation to 45 degrees.  She does have full extension.   Radiographs show osteoarthritis of the left hip.   ASSESSMENT:  Osteoarthritis of the left hip.   PLAN:  The patient is  scheduled for a left total hip arthroplasty at this  time.  The risks and benefits were discussed including infection,  neurovascular injury, persistent pain, dislocation of the hip, DVT,  pulmonary embolus.  The patient states that she understands and wishes to  proceed at this time.  We anticipate that she will need rehabilitation, due  to the fact that she lives alone at home.                                                Newt Minion, M.D.    MVD/MEDQ  D:  09/03/2003  T:  09/03/2003  Job:  [id number] 

## 2010-09-10 NOTE — Discharge Summary (Signed)
NAMEENAYA, WEYLAND                        ACCOUNT NO.:  [id number]   MEDICAL RECORD NO.:  [id number]                   PATIENT TYPE:  IPS   LOCATION:  [id number]                                 FACILITY:  [facility]   PHYSICIAN:  Charlett Blake, M.D.           DATE OF BIRTH:  November 29, 1925   DATE OF ADMISSION:  09/08/2003  DATE OF DISCHARGE:  09/12/2003                                 DISCHARGE SUMMARY   DISCHARGE DIAGNOSES:  1. Left total hip arthroplasty secondary to osteoarthritis.  2. History of chronic obstructive pulmonary disease.  3. History of depression.  4. History of colon cancer.   HISTORY OF PRESENT ILLNESS:  The patient is a 77 year old white female  admitted on 09/10/2003 with chronic longstanding left hip pain.  No  significant improvement with conservative care.  The patient underwent a  left total hip arthroplasty on 09/03/2003 secondary to osteoarthritis by Dr.  Meridee Score.  Coumadin for DVT prophylaxis.  PT reports at this time  indicates the patient is weightbearing as tolerated, ambulating 80 feet with  supervision level with rolling walker, transfers bed mobility supervision  level.  Postoperative course was sedation secondary to narcotics and burning  sensation with urination.  The patient was started on Cipro empirically for  presumed UTI for three days.  The patient was transferred to the [facility] Department for [facility] level therapies on 09/08/2003.   PAST MEDICAL HISTORY:  Past medical history significant for elevated  cholesterol, COPD, depression, colon cancer.  Past surgery history includes  hysterectomy, colectomy, gallbladder.  Primary care Adalynd Donahoe is Dr. Stevie Kern.   REVIEW OF SYSTEMS:  Constipation, stress incontinence, reflux, and joint  pain.   FAMILY HISTORY:  Family history noncontributory.   SOCIAL HISTORY:  The patient lives alone in a two-level home.  Bedroom is on  the first floor.  Local assistance, family can check in as  needed.  She was  independent prior to admission.   HOSPITAL COURSE:  Mrs. Jeraline Doolin was admitted to [place]  Department on 09/08/2003 for comprehensive rehabilitation.  She received  more than three hours of therapy daily.  Overall Mrs. Elizondo progressed  fairly quickly during her short four-day stay in rehab.  She was discharged  on modified independent level.  Hospital course was significant for anemia.  The patient was able to ambulate so well she did not require any assistive  device at the time of discharge.  The patient remained on Coumadin for DVT  prophylaxis without any bleeding complications noted.  Pain has been  controlled fairly well with Darvocet.  The patient complained of  constipation problems.  She requested to have MiraLax as needed and at  specific times.  The patient on 09/11/2003 requested to resume her Senokot  S.  The patient remained on Lexapro 10 mg daily for history of depression as  well as Zocor 40 mg q.h.s. for history  of cholesterolemia.  The patient from  a pulmonary standpoint was stable throughout the stay in rehab.  She  continued to take Advair one puff b.i.d.  There were no other major medical  issues around rehab.  The surgical incision was healing very well  demonstrating no signs of infection.   Latest hemoglobin 9, hematocrit 26.4, white blood cell count 8.8, platelets  496,000, INR 2.4.  Sodium 139, potassium 3.7, chloride 104, CO2 26, glucose  117, BUN 17, creatinine 1, AST 23, ALT 21.  Urine culture:  2000 colonies  _________ growth.  At the time of discharge all vital signs were stable.  PT  report indicated patient able to ambulate greater than 1000 feet, no  assistive device.  Transfers modified independent, bed mobility moderate  independent.  Able to observe for hip precautions very well.  Able to  perform all ADLs modified independent.  The patient was discharged home with  her family.   DISCHARGE MEDICATIONS:  Discharge  medications include Trinsicon one tablet  twice daily, Advair resume home dose, Oxytrol patch resume home dose,  Darvocet 1 to 2 tablets as needed for pain, Zantac 300 mg daily, aspirin do  not take while on Coumadin, Coumadin 1 mg one half tablet until 10/04/2003  then stop.  Resume cholesterol medicines, resume Lexapro.  She also received  Desyrel 1 to 2 tablets as needed for sleep.  Pain managed with Darvocet and  Tylenol.  No driving.  Observe her hip precautions.  No drinking alcohol.  No smoking.  Staples removed next week at Dr. Jess Barters office.  Follow up with  Dr. Stevie Kern to follow her Coumadin on 09/16/2003.  No outpatient  therapy was recommended at this time.  The patient to follow up with Dr.  Meridee Score in two weeks.  The patient is to go to Dr. Dellis Filbert Todd's office  on 09/16/2003 to have Coumadin and INR checked.  She is to follow up with  Dr. Alysia Penna as needed.      Pamella Pert, P.A.                         Charlett Blake, M.D.    LB/MEDQ  D:  09/25/2003  T:  09/26/2003  Job:  [id number]   cc:   Dellis Filbert A. Sherren Mocha, M.D. [facility]   Newt Minion, M.D.  Fax: [phone]

## 2010-09-10 NOTE — H&P (Signed)
[facility]  Patient:    BELLALUNA, ROELLE                       MRN: [id number] Adm. Date:  03/26/00 Attending:  Biagio Borg, M.D. [facility] CC:         Joycelyn Man, M.D. [facility]   History and Physical  CHIEF COMPLAINT:  Abdominal pain.  HISTORY OF PRESENT ILLNESS:  Ms. Sugalski is a 77 year old white female who awoke "feeling off," with a decreased appetite this morning, December 2nd.  Started with nausea and vomiting at 3 p.m., associated with mid-lower abdominal pain, somewhat radiating to the left.  She had a BM with a glycerin suppository because she felt she might be constipated.  The BM was otherwise within normal limits, no bright red blood; however, the pain persisted.  She had six total episodes of vomiting.  She has not tried any food or water because she knows it will just come up.  She denies any GU symptoms, back pain and fever, but did have some chills prior to coming to the ER.  She is status post TAH/BSO in the past.  PAST MEDICAL HISTORY 1. History of small-bowel obstruction, 2000, from either a kink or adhesions. 2. Colon polyp.  Multiple colonoscopies since 1985 per Dr. Lowella Bandy. [place]. 3. History of partial colectomy for suspicious lesion, not clearly colon    cancer, per patient. 4. Anxiety/depression.  PAST SURGICAL HISTORY 1. Status post cholecystectomy. 2. Status post TAH/BSO. 3. Status post T&A.  ALLERGIES:  No known drug allergies.  MEDICATIONS 1. Baby aspirin 81 mg p.o. q.d. 2. Zantac 150 mg p.o. b.i.d. 3. Elavil 10 mg q.h.s. 4. Celexa 10 mg p.o. q.d.  SOCIAL HISTORY:  No tobacco.  No alcohol.  Widowed.  Retired Programmer, applications.  Two children.  FAMILY HISTORY:  Father deceased with colon cancer.  Two brothers and one sister all deceased with strokes.  REVIEW OF SYSTEMS:  Otherwise noncontributory.  PHYSICAL EXAMINATION  GENERAL:  Ms. Riach is a 77 year old white female, pleasant, alert and appropriate.  VITAL SIGNS:   Blood pressure 133/52, pulse 85, respirations 20, temperature 97.7.  EENT:  Sclerae clear.  TMs clear.  Pharynx benign.  NECK:  No lymphadenopathy, JVD or thyromegaly.  CHEST:  No rales or wheezing.  CARDIAC:  Regular rate and rhythm.  ABDOMEN:  Soft.  Positive bowel sounds.  There is moderate left lower quadrant tenderness.  No guarding or rebound.  EXTREMITIES:  No edema.  NEUROLOGIC:  Cranial nerves II-XII are intact; otherwise, nonfocal.  RECTAL:  Deferred.  LABORATORY AND X-RAY FINDINGS:  CMET with potassium of 3.4, glucose 150, LFTs normal, electrolytes otherwise within normal limits.  White blood cell count 14.9, hemoglobin 12.1.  Chest x-ray:  No active disease.  Abdominal series with nonspecific bowel gas pattern.  No obstruction or free air.  ASSESSMENT AND PLAN 1. Moderate left lower quadrant pain with chills, nausea, vomiting and    elevated white blood cell count:  She does not appear toxic or    hyperdynamic, but given the above, she will be admitted with presumed    diverticulitis.  Will check urinalysis, culture and sensitivity, blood    culture x 2, and given pain medications, antiemetics, intravenous    antibiotics with intravenous Cipro, intravenous Flagyl, intravenous    fluids, clear liquids, advance as tolerated, as well as abdominopelvic CT    to confirm the diagnosis. 2. Hyperglycemia, mild:  Check capillary blood  glucoses q.i.d. for 24 hours.    Probably elevated secondary to stress.  Check hemoglobin A1c to assess    overall long-term control. 3. Hypokalemia likely secondary to nausea and vomiting:  Will replace    intravenously.  Recheck labs in the a.m. DD:  03/26/00 TD:  03/27/00 Job: [id number]

## 2010-09-10 NOTE — Discharge Summary (Signed)
[facility]  Patient:    [name], [name]                     MRN: [id number] Adm. Date:  [id number] Disc. Date: 03/28/00 Attending:  Biagio Borg CC:         Joycelyn Man, M.D. [facility]   Discharge Summary  DISCHARGE DIAGNOSES: 1. Abdominal pain, left lower quadrant, presumed diverticulitis. 2. Anemia. 3. Mild glucose intolerance. 4. Mild hypokalemia. 5. History of small bowel obstruction in 2000. 6. History of colon polyp with multiple colonoscopies. 7. History of partial colectomy. 8. History of anxiety/depression. 9. Status post cholecystectomy, total abdominal hysterectomy, bilateral    salpingo-oophorectomy, and tonsillectomy and adenoidectomy.  PROCEDURE:  Abdominopelvic CT, negative.  CONSULTATIONS:  None.  HISTORY AND PHYSICAL:  Please see dictation March 26, 2000.  HOSPITAL COURSE:  [name] is a 77 year old white female who presented with moderate left lower quadrant pain, elevated white blood cell count, low grade temperature, presume diverticulitis.  She was treated with IV Cipro and Flagyl with a very rapid improvement in signs and symptoms, including low grade temperature, decreased white blood cell count to 10, resolution of nausea and vomiting, and complete resolution of discomfort by the second day of hospitalization.  She was noted on admission to have slightly low potassium, resolved with replacement IV.  There was a mildly high glucose related to stress of the illness.  Hemoglobin A1C was 5.5.  Overall, has normal glucose tolerance at home it appears.  There was some mild anemia with hemoglobin initially 12.1, the next day 10.4.  It is not sure if this is lab or delusional.  Guaiac stool negative.  B12, folate, iron studies within normal limits.  On the third day of hospitalization she had no discomfort, no nausea or vomiting, had slightly low appetite, the pain resolved, and temperature max was 99.2.  She was felt to  have gained maximum benefit from this hospitalization, and is to be discharged to home.  DISPOSITION:  Discharged to home in good condition.  DIET:  Regular as per previous.  ACTIVITY:  No restrictions except I did advise her not to leave town for the next week until it is certain that she will do well outside the hospital, as she had previously planned to go to the [place]: 1. Cipro 500 mg p.o. b.i.d. x 8 days. 2. Flagyl 250 mg p.o. q.i.d. x 8 days. 3. Compazine 10 mg q.i.d. p.r.n. 4. Baby aspirin 81 mg p.o. q.d. 5. Pepcid 20 mg b.i.d. 6. Celexa 10 mg p.o. q.d.  FOLLOWUP:  Dr. Stevie Kern in 1 to 2 weeks, or sooner if redevelops any abdominal discomfort, increasing fever, nausea or vomiting. DD:  03/28/00 TD:  03/28/00 Job: [id number]

## 2010-09-10 NOTE — Discharge Summary (Signed)
NAMESHAHINA, CAVALLO                        ACCOUNT NO.:  [id number]   MEDICAL RECORD NO.:  [id number]                   PATIENT TYPE:  INP   LOCATION:  [id number]                                 FACILITY:  [facility]   PHYSICIAN:  Newt Minion, M.D.                DATE OF BIRTH:  1925-12-17   DATE OF ADMISSION:  09/03/2003  DATE OF DISCHARGE:  09/08/2003                                 DISCHARGE SUMMARY   DISCHARGE DIAGNOSIS:  Osteoarthritis left hip.   PROCEDURE:  Left total hip arthroplasty.   CONDITION ON DISCHARGE:  Discharged to rehab in stable condition.  Plan to  follow up in the office in two weeks.   HISTORY OF PRESENT ILLNESS:  The patient is a 77 year old woman with  osteoarthritis of her left hip.  She has failed conservative care and was  unable to perform activities of daily living due to left hip pain and  presents at this time for a left total hip arthroplasty.   HOSPITAL COURSE:  The patient underwent left total hip arthroplasty on Sep 03, 2003, with [employer] components with a 50 mm acetabulum, a #1  Accolade femur, a +0 neck, and a 36 mm head.  She was discharged to PACU in  stable condition.  Postoperatively, her course was unremarkable.  She  received Kefzol for infection prophylaxis for 24 hours and she was started  on Coumadin for DVT prophylaxis.  She was seen by physical therapy,  occupational therapy, and rehab was consulted due to slowness in her rehab.  The patient states she prefers to go to rehab and she states she felt unsafe  going home.  The patient was discharged to rehab on postoperative day #7.  She will follow up in the office in two weeks after discharge.                                                Newt Minion, M.D.    MVD/MEDQ  D:  09/25/2003  T:  09/26/2003  Job:  [id number]

## 2010-09-10 NOTE — Op Note (Signed)
[name], [name]                        ACCOUNT NO.:  [id number]   MEDICAL RECORD NO.:  [id number]                   PATIENT TYPE:  INP   LOCATION:  2899                                 FACILITY:  [facility]   PHYSICIAN:  Newt Minion, M.D.                DATE OF BIRTH:  [birth year]   DATE OF PROCEDURE:  09/03/2003  DATE OF DISCHARGE:                                 OPERATIVE REPORT   PREOPERATIVE DIAGNOSIS:  Osteoarthritis of the left hip.   POSTOPERATIVE DIAGNOSIS:  Osteoarthritis of the left hip.   PROCEDURE:  Left total hip arthroplasty.   SURGEON:  Newt Minion, M.D.   ANESTHESIA:  General.   ESTIMATED BLOOD LOSS:  200 mL.   ANTIBIOTICS:  1 gram of Kefzol.   COMPONENTS:  Acetabulum 50 mm, Osteonics femur #1 Accolade stem; neck +0  head, 36 mm.   DISPOSITION:  To post-anesthesia care unit in stable condition.   INDICATIONS FOR PROCEDURE:  The patient is a 77 year old woman with chronic  osteoarthritis of her left hip.  The patient states that she is unable to  perform activities of daily living due to left hip pain and presents at this  time for left total hip arthroplasty.  The risks and benefits were discussed  including infection, neurovascular injury, persistent pain, the need for  additional surgery, deep venous thrombosis, pulmonary embolus and  dislocation of the hip.  The patient states she understands and wishes to  proceed at this time.   DESCRIPTION OF PROCEDURE:  The patient was brought to OR room five and  underwent a general anesthetic. After an adequate level of anesthesia was  obtained the patient was placed in the right lateral decubitus position with  the left side up and her left lower extremity was prepped using Duraprep and  draped into a sterile field. An Charlie Pitter was used to cover all exposed skin.  A  posterolateral incision was made and this was carried down through the  tensor fascia lata which was split.  A Charnley retractor was placed.   The  sciatic nerve was protected throughout the case.  The pyriformis and the  short external rotators were cut and retracted.  The capsule was T'd, cut  and retracted.  The head was dislocated and the femoral neck cut was made 1  cm proximal to the calcar.  Attention was first focused on the acetabulum.  The acetabulum was reamed to a 50 mm acetabulum. This was tried and a 50 mm  acetabulum was inserted.  The trial poly liner was placed. The hand awl was  then advanced through the femur and the femur was sequentially broached to a  #1 femoral stem.  This was then trialed with a +0 neck, 36 mm head.  This  had full range of motion.  She had flexion of 120 degrees, full adduction  with internal rotation of 70 degrees and  was fully stable.  She had full  extension and external rotation without instability.  The hip was then re-  dislocated.  The centralizer plug was placed in the acetabular component and  the 10 degree polyethylene liner was placed.  Attention was then focused on  the femur.  The femoral Accolade stem was then inserted with the 36 mm head.  The wound was irrigated continuously throughout the case.  The hip was  reduced and again placed through a full range of motion with no instability.  The capsule and the short external rotators were reapproximated.  The tensor  fascia lata  was closed using a running #1 Vicryl subcutaneous and deep fascia were  closed using 2-0 Vicryl.  The skin was closed using approximated staples.  The wound was covered with Adaptic orthopedic sponges and Ioban drape.  The  patient was transferred to the bed, extubated and taken to post-anesthesia  care unit in stable condition.                                               Newt Minion, M.D.    MVD/MEDQ  D:  09/03/2003  T:  09/03/2003  Job:  [id number]

## 2010-09-24 ENCOUNTER — Ambulatory Visit: Payer: Medicare Other | Admitting: Physical Medicine & Rehabilitation

## 2010-09-24 ENCOUNTER — Encounter: Payer: Medicare Other | Attending: Physical Medicine & Rehabilitation

## 2010-09-24 DIAGNOSIS — M19029 Primary osteoarthritis, unspecified elbow: Secondary | ICD-10-CM

## 2010-09-24 DIAGNOSIS — M51379 Other intervertebral disc degeneration, lumbosacral region without mention of lumbar back pain or lower extremity pain: Secondary | ICD-10-CM | POA: Insufficient documentation

## 2010-09-24 DIAGNOSIS — Z96619 Presence of unspecified artificial shoulder joint: Secondary | ICD-10-CM | POA: Insufficient documentation

## 2010-09-24 DIAGNOSIS — M19019 Primary osteoarthritis, unspecified shoulder: Secondary | ICD-10-CM | POA: Insufficient documentation

## 2010-09-24 DIAGNOSIS — M47817 Spondylosis without myelopathy or radiculopathy, lumbosacral region: Secondary | ICD-10-CM | POA: Insufficient documentation

## 2010-09-24 DIAGNOSIS — M961 Postlaminectomy syndrome, not elsewhere classified: Secondary | ICD-10-CM

## 2010-09-24 DIAGNOSIS — Z7982 Long term (current) use of aspirin: Secondary | ICD-10-CM | POA: Insufficient documentation

## 2010-09-24 DIAGNOSIS — M5137 Other intervertebral disc degeneration, lumbosacral region: Secondary | ICD-10-CM | POA: Insufficient documentation

## 2010-09-24 DIAGNOSIS — Z79899 Other long term (current) drug therapy: Secondary | ICD-10-CM | POA: Insufficient documentation

## 2010-09-24 NOTE — Group Therapy Note (Signed)
HISTORY:  This is an 77 year old female with multiple pain complaints. Her chief complaints include her low back as well as her shoulder on the right side.  She has had left shoulder replacement which was helpful for pain and end-stage osteoarthritis.  She has end-stage osteoarthritis on her right shoulder.  Has had ultrasound-guided injection which was helpful for a month or 2, but her pain has come back.  She is reluctant to undergo repeat right-sided total shoulder arthroplasty because she had such problems with recovery from the anesthetic standpoint.  In addition, she has had back pain, has had some type of epidural injections per Dr. Ernestina Patches, she has been evaluated by Dr. Sharol Given, from Orthopedics.  MRI of last performed on February 08, 2010, this was reviewed.  She does have some bilateral facet hypertrophy at L3-4, this is a few segment.  At L4-5 disk degeneration, bilateral facet hypertrophy at L5-S1 on normal disk, mild facet hypertrophy.  Her pain is 4/10 increases with activity to 5.  She is fully functional. She drives.  She is independent, although she now has somebody do her cooking and cleaning.  Pain as well as yard work are secondary pain complaints include hand pain and wrist pain bilaterally.  She has tried multiple medications and she has multiple drug intolerances.  She has tried CODEINE and HYDROCODONE causing nausea. She could not tolerate SULFA causes nausea, PHENERGAN causes muscle spasm, LYRICA causes hallucination, ZANAFLEX causes her mind to be gone, and TRAMADOL caused her to be very angry.  She does note that she has tried some Dilaudid in the hospital which she thought helped her postoperatively.  Current meds include Ambien 10 mg day, MiraLax, Prilosec, and the aspirin was only 81 mg.  Exam, this is a frail elderly female in no acute distress.  Eyes anicteric, noninjected.  Vitals, blood pressure 126/59, pulse 80, respirations 18, and O2 sat 96% on room  air.  Her gait is normal.  No evidence of toe drag or knee instability.  She has limited range of motion in the right upper extremity, gets to about 90 degrees of abduction and forward flexion of the shoulder.  She has normal elbow, wrist, and hand range of motion.  She has squared off CMC joints bilaterally with pain during carpal and metacarpal stress.  She has no pain in the MCPs or PIP or DIPs, no joint deformity.  Her shoulder has no tenderness to palpation.  No redness.  No pain over the [facility] joint area.  Left shoulder has good range of motion, upper extremity strength is normal except for the right deltoid, which is 3- due to pain in range of motion and limitations.  Lower extremity strength is normal in hip flexion, knee extension, ankle dorsiflexors.  Her back has some tenderness to palpation lumbar paraspinals, pain with extension greater than with flexion, hips have no pain with range of motion or to palpation of the greater trochanters.  IMPRESSION: 1. Lumbar pain.  This is likely multifactorial.  She has degenerative     disk L4-5 but also has multilevel facet arthrosis since this is one     of her primary complaints.  We will set her up for lumbar medial     branch blocks, bilateral L5 dorsal ramus in L4 medial branch. 2. Right shoulder pain.  We will need to repeat shoulder ultrasound-     guided injection. 3. Trial Voltaren gel to the shoulder q.i.d. 4. Trial Limbrel 250 b.i.d., she should be able  to tolerate this. 5. Avoid narcotic analgesics due to poor tolerance as well as oral     nonsteroidals.  I discussed with the patient and agrees with plan and did indicate that some her pain will be chronic, I really do not think she will get full relief.     [name], M.D. Electronically Signed    AEK/MedQ D:  09/24/2010 15:20:08  T:  09/24/2010 23:30:24  Job #:  [id number]  cc:   Dellis Filbert [address], [place] [address]  Kevin M.  Supple, M.D. Fax: [phone]

## 2010-09-27 ENCOUNTER — Other Ambulatory Visit: Payer: Self-pay | Admitting: Family Medicine

## 2010-10-18 ENCOUNTER — Ambulatory Visit: Payer: Medicare Other | Admitting: Physical Medicine & Rehabilitation

## 2010-10-18 ENCOUNTER — Encounter: Payer: Medicare Other | Admitting: Physical Medicine & Rehabilitation

## 2010-12-01 NOTE — Op Note (Signed)
[name], [name]              ACCOUNT NO.:  [id number]  MEDICAL RECORD NO.:  [id number]           PATIENT TYPE:  O  LOCATION:  SDSC                         FACILITY:  [facility]  PHYSICIAN:  Dominica Severin A. Emmanuelle Coxe, M.D.   DATE OF BIRTH:  1925/11/07  DATE OF PROCEDURE:  08/30/2010 DATE OF DISCHARGE:  08/30/2010                              OPERATIVE REPORT   PREOPERATIVE DIAGNOSES: 1. Massive subretinal hemorrhage macular region, left eye. 2. Age-related macular degeneration, left eye. 3. History of choroidal neovascular membrane, left eye.  POSTOPERATIVE DIAGNOSES: 1. Massive subretinal hemorrhage macular region, left eye. 2. Age-related macular degeneration, left eye. 3. History of choroidal neovascular membrane, left eye.  PROCEDURES: 1. Posterior vitrectomy with left eye - 25 gauge. 2. Injection of pharmacological agent - TPA - 25 micro liters     subretinal injection with 41 gauge.  SURGEON:  Clent Demark. Viktoria Gruetzmacher, MD.  ANESTHESIA:  Local retrobulbar, monitored anesthesia control.  INDICATIONS FOR PROCEDURE:  The patient is an 77 year old woman who has age-related macular degeneration, undergoing treatments with Avastin for __________ growth, who actually is some 2 weeks post recent injection and suffered massive subretinal hemorrhage approximately 20 days ago in size and covering the entire macular region.  She was given injection of Lucentis last week to hasten the __________ effect, but also today is __________ to go injection of subretinal TPA to facilitate clot lysis and subsequently pneumatic displacement using intravitreal gas injection.  The patient understands the risks of anesthesia including recurrence, death, loss of the eye, including but not limited to hemorrhage, infection, scarring, need for another surgery, no change in vision, loss of vision, and understands progressive disease likely in this case, to try and limit the ULTIMATE size of the  scotoma. __________  PROCEDURE IN DETAIL:  After appropriate signed consent was obtained, the patient was taken to the operating room.  In the operating room, appropriate monitors followed by mild sedation.  Site selection was confirmed with the entire operative room staff of the left eye and then subsequently under mild sedation 2% Xylocaine 5 mL injected retrobulbar with additional 5 mL laterally in fashion of modified Kirk Ruths.  The right periocular region was sterilely prepped and draped in the usual ophthalmic fashion.  Lid speculum applied.  A 25-gauge trocar placed in the infratemporal quadrant.  Superior trocar was applied.  Core vitrectomy was then began.  Posterior hyaloid was removed and then spontaneously detached and vitreous skirt trimmed 360 degrees.  Anterior hyaloid was confirmed to be removed.  Under fluid, a 41-gauge __________ subretinal needle was used to inject TPA 25 microliters to __________ into the subretinal space.  This created nice ballooning effect and localization of the TPA in the subretinal space.  No dramatic holes were formed.  In this fashion, fluid exchange completed.  Air - SF6 15% solution was then exchanged.  Superior trocars were removed.  The infusion was removed.  Intraocular pressure was assessed and found to be adequate.  Subretinal  __________steroids had been applied.  Sterile patch and fox-shield applied.  The patient tolerated the procedure well without complications.  The patient was taken  to the PACU in good and stable condition.     Clent Demark Waylen Depaolo, M.D.     GAR/MEDQ  D:  08/30/2010  T:  08/31/2010  Job:  [id number]  Electronically Signed by Deloria Lair M.D. on 12/01/2010 02:32:07 PM

## 2011-03-28 ENCOUNTER — Encounter: Payer: Self-pay | Admitting: Family Medicine

## 2011-03-28 ENCOUNTER — Ambulatory Visit (INDEPENDENT_AMBULATORY_CARE_PROVIDER_SITE_OTHER): Payer: Medicare Other | Admitting: Family Medicine

## 2011-03-28 VITALS — BP 110/72 | Temp 98.2°F | Wt 124.0 lb

## 2011-03-28 DIAGNOSIS — F329 Major depressive disorder, single episode, unspecified: Secondary | ICD-10-CM

## 2011-03-28 DIAGNOSIS — Z23 Encounter for immunization: Secondary | ICD-10-CM

## 2011-03-28 MED ORDER — ESCITALOPRAM OXALATE 20 MG PO TABS: 20.0000 mg | ORAL_TABLET | Freq: Every day | ORAL | Status: DC

## 2011-03-28 NOTE — Progress Notes (Signed)
  Subjective:    Patient ID: [name], female    DOB: 1925/11/18, 77 y.o.   MRN: [id number]  HPIM. 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  In the, past.  She's been on Lexapro 20 mg daily with good relief from her symptoms.  She would like to restart it.  She is gradually losing her vision despite the best efforts of Dr. Talbert Forest and Dr. Zadie Rhine.  She also has difficulty ambulating, and would like a handicap sticker.  She's had back surgery and now has chronic back pain.  She is able to care for herself with help at home.    Review of Systems In general, and psychiatric review of systems otherwise negative    Objective:   Physical Exam  Thin female, in no acute distress.  Cardiopulmonary exam normal.  Breast exam normal mental status exam normal except for slight decrease in mood, consistent with depression      Assessment & Plan:  Recurrent depression.  Plan restart Lexapro return in two months for follow-up

## 2011-03-28 NOTE — Patient Instructions (Signed)
Restart the Lexapro 20 mg a day at bedtime.  Return in two months for follow-up sooner  if any problems

## 2011-05-04 ENCOUNTER — Other Ambulatory Visit: Payer: Self-pay | Admitting: Family Medicine

## 2011-05-05 ENCOUNTER — Other Ambulatory Visit: Payer: Self-pay | Admitting: *Deleted

## 2011-05-05 MED ORDER — ZOLPIDEM TARTRATE 10 MG PO TABS: 10.0000 mg | ORAL_TABLET | Freq: Every evening | ORAL | Status: DC | PRN

## 2011-05-30 ENCOUNTER — Encounter: Payer: Self-pay | Admitting: Family Medicine

## 2011-05-30 ENCOUNTER — Ambulatory Visit (INDEPENDENT_AMBULATORY_CARE_PROVIDER_SITE_OTHER): Payer: Medicare Other | Admitting: Family Medicine

## 2011-05-30 DIAGNOSIS — G4700 Insomnia, unspecified: Secondary | ICD-10-CM

## 2011-05-30 DIAGNOSIS — R52 Pain, unspecified: Secondary | ICD-10-CM

## 2011-05-30 DIAGNOSIS — F329 Major depressive disorder, single episode, unspecified: Secondary | ICD-10-CM

## 2011-05-30 MED ORDER — ESCITALOPRAM OXALATE 20 MG PO TABS: 20.0000 mg | ORAL_TABLET | Freq: Every day | ORAL | Status: DC

## 2011-05-30 MED ORDER — ZOLPIDEM TARTRATE 5 MG PO TABS: ORAL_TABLET | ORAL | Status: DC

## 2011-05-30 NOTE — Patient Instructions (Signed)
Continue the Lexapro 20 mg daily  Decrease the Ambien only take a half of a 5 mg tablet at bedtime  Return in one year or sooner if any problems

## 2011-05-30 NOTE — Progress Notes (Signed)
  Subjective:    Patient ID: [name], female    DOB: 09/23/1925, 78 y.o.   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  She lives alone but she has hired help who comes in and helps her with cooking cleaning etc. She does have a health care power of attorney and a sister in town in case she needs further help.    Review of Systems General and psychiatric review of systems otherwise negative    Objective:   Physical Exam Thin female in no acute distress       Assessment & Plan:  History of depression continue Lexapro 2020 mg daily  Reflux esophagitis continue Prilosec 20 daily  Insomnia decrease Ambien to one half tablet of the 5 mg  Chronic pain syndrome followup in the pain clinic

## 2011-12-27 ENCOUNTER — Other Ambulatory Visit: Payer: Self-pay | Admitting: *Deleted

## 2011-12-27 DIAGNOSIS — G4700 Insomnia, unspecified: Secondary | ICD-10-CM

## 2011-12-27 MED ORDER — ZOLPIDEM TARTRATE 5 MG PO TABS: ORAL_TABLET | ORAL | Status: DC

## 2012-01-10 ENCOUNTER — Encounter: Payer: Self-pay | Admitting: Internal Medicine

## 2012-02-22 ENCOUNTER — Telehealth: Payer: Self-pay | Admitting: Family Medicine

## 2012-02-22 DIAGNOSIS — G4700 Insomnia, unspecified: Secondary | ICD-10-CM

## 2012-02-22 NOTE — Telephone Encounter (Signed)
Caller: [name]/Patient; Patient Name: [name]; PCP: Stevie Kern [facility]); Best Callback Phone Number: [phone].  Patient calling about change in ambien from 5mg  to 2.5mg  at night.  States it is not working well for her, so she has continued to take 5mg , and her Rx has run out.  Pharmacy tells her it is far to early to fill, and will not refill for 50 more days.  Patient would like new Rx for ambien 5mg .  States has been on this medication for many years with no ill effects.  Declines new triage; info to office for provider review/Rx/callback.  Patient also wants to schedule flu shot; transferred to office for assistance scheduling.  MAY REACH PATIENT AT [phone].

## 2012-02-23 NOTE — Telephone Encounter (Signed)
Refill Ambien 5 mg #100 directions 1 tab each bedtime refills x2

## 2012-02-24 MED ORDER — ZOLPIDEM TARTRATE 5 MG PO TABS: 5.0000 mg | ORAL_TABLET | Freq: Every evening | ORAL | Status: DC | PRN

## 2012-02-24 NOTE — Telephone Encounter (Signed)
Rx called in and Left message on machine for patient. 

## 2012-02-27 ENCOUNTER — Ambulatory Visit (INDEPENDENT_AMBULATORY_CARE_PROVIDER_SITE_OTHER): Payer: Medicare Other | Admitting: Family Medicine

## 2012-02-27 DIAGNOSIS — Z23 Encounter for immunization: Secondary | ICD-10-CM

## 2012-06-18 ENCOUNTER — Other Ambulatory Visit: Payer: Self-pay | Admitting: Physical Medicine and Rehabilitation

## 2012-06-18 DIAGNOSIS — IMO0002 Reserved for concepts with insufficient information to code with codable children: Secondary | ICD-10-CM

## 2012-06-22 ENCOUNTER — Encounter (HOSPITAL_COMMUNITY): Payer: Self-pay

## 2012-06-25 ENCOUNTER — Ambulatory Visit
Admission: RE | Admit: 2012-06-25 | Discharge: 2012-06-25 | Disposition: A | Discharge status: Home / Self Care | Payer: Medicare Other | Source: Ambulatory Visit | Attending: Physical Medicine and Rehabilitation | Admitting: Physical Medicine and Rehabilitation

## 2012-06-25 DIAGNOSIS — IMO0002 Reserved for concepts with insufficient information to code with codable children: Secondary | ICD-10-CM

## 2012-06-25 MED ORDER — GADOBENATE DIMEGLUMINE 529 MG/ML IV SOLN
10.0000 mL | Freq: Once | INTRAVENOUS | Status: AC | PRN
  Administered 2012-06-25: 10 mL via INTRAVENOUS

## 2012-06-28 ENCOUNTER — Encounter (HOSPITAL_COMMUNITY)
Admission: RE | Admit: 2012-06-28 | Discharge: 2012-06-28 | Disposition: A | Discharge status: Home / Self Care | Payer: Medicare Other | Source: Ambulatory Visit | Attending: Orthopedic Surgery | Admitting: Orthopedic Surgery

## 2012-06-28 ENCOUNTER — Encounter (HOSPITAL_COMMUNITY): Payer: Self-pay

## 2012-06-28 HISTORY — DX: Shortness of breath: R06.02

## 2012-06-28 LAB — SURGICAL PCR SCREEN
MRSA, PCR: NEGATIVE
Staphylococcus aureus: NEGATIVE

## 2012-06-28 LAB — BASIC METABOLIC PANEL
GFR calc Af Amer: 67 mL/min — ABNORMAL LOW (ref >90)
GFR calc non Af Amer: 58 mL/min — ABNORMAL LOW (ref >90)
Potassium: 4.2 mEq/L (ref 3.5–5.1)
Sodium: 140 mEq/L (ref 135–145)

## 2012-06-28 LAB — PROTIME-INR
INR: 0.92 (ref 0.00–1.49)
Prothrombin Time: 12.3 seconds (ref 11.6–15.2)

## 2012-06-28 LAB — CBC
Hemoglobin: 12.4 g/dL (ref 12.0–15.0)
MCHC: 33.8 g/dL (ref 30.0–36.0)
RBC: 4.18 MIL/uL (ref 3.87–5.11)

## 2012-06-28 LAB — TYPE AND SCREEN
ABO/RH(D): A POS
Antibody Screen: NEGATIVE

## 2012-06-28 MED ORDER — CHLORHEXIDINE GLUCONATE 4 % EX LIQD: 60.0000 mL | Freq: Once | CUTANEOUS | Status: DC

## 2012-06-28 NOTE — Pre-Procedure Instructions (Signed)
MARIDEL DAVIN  06/28/2012   Your procedure is scheduled on:  Thursday July 05, 2012  Report to [facility] at [place] AM.  Call this number if you have problems the morning of surgery: [phone]   Remember:   Do not eat food or drink liquids after midnight.   Take these medicines the morning of surgery with A SIP OF WATER: Lexapro, Hydrocodone-Acetaminophen if needed, and Prilosec   Do not wear jewelry, make-up or nail polish.  Do not wear lotions, powders, or perfumes. You may wear deodorant.  Do not shave 48 hours prior to surgery.   Do not bring valuables to the hospital.  Contacts, dentures or bridgework may not be worn into surgery.  Leave suitcase in the car. After surgery it may be brought to your room.  For patients admitted to the hospital, checkout time is 11:00 AM the day of  discharge.   Patients discharged the day of surgery will not be allowed to drive  home.    Special Instructions: Incentive Spirometry - Practice and bring it with you on the day of surgery. Shower using CHG 2 nights before surgery and the night before surgery.  If you shower the day of surgery use CHG.  Use special wash - you have one bottle of CHG for all showers.  You should use approximately 1/3 of the bottle for each shower. N/A   Please read over the following fact sheets that you were given: Pain Booklet, Coughing and Deep Breathing, Blood Transfusion Information, MRSA Information and Surgical Site Infection Prevention

## 2012-06-28 NOTE — Progress Notes (Signed)
Patient stated she would like to wear her own Ted hose,not measured.

## 2012-07-04 MED ORDER — CEFAZOLIN SODIUM-DEXTROSE 2-3 GM-% IV SOLR
2.0000 g | INTRAVENOUS | Status: AC
  Administered 2012-07-05: 2 g via INTRAVENOUS
  Filled 2012-07-04: qty 50

## 2012-07-05 ENCOUNTER — Inpatient Hospital Stay (HOSPITAL_COMMUNITY)
Admission: RE | Admit: 2012-07-05 | Discharge: 2012-07-06 | DRG: 484 | Disposition: A | Discharge status: Home Health | Payer: Medicare Other | Source: Ambulatory Visit | Attending: Orthopedic Surgery | Admitting: Orthopedic Surgery

## 2012-07-05 ENCOUNTER — Encounter (HOSPITAL_COMMUNITY): Payer: Self-pay | Admitting: *Deleted

## 2012-07-05 ENCOUNTER — Encounter (HOSPITAL_COMMUNITY): Payer: Self-pay | Admitting: Anesthesiology

## 2012-07-05 ENCOUNTER — Encounter (HOSPITAL_COMMUNITY)
Admission: RE | Disposition: A | Discharge status: Home Health | Payer: Self-pay | Source: Ambulatory Visit | Attending: Orthopedic Surgery

## 2012-07-05 ENCOUNTER — Inpatient Hospital Stay (HOSPITAL_COMMUNITY): Payer: Medicare Other | Admitting: Anesthesiology

## 2012-07-05 DIAGNOSIS — F329 Major depressive disorder, single episode, unspecified: Secondary | ICD-10-CM | POA: Diagnosis present

## 2012-07-05 DIAGNOSIS — H409 Unspecified glaucoma: Secondary | ICD-10-CM | POA: Diagnosis present

## 2012-07-05 DIAGNOSIS — M19019 Primary osteoarthritis, unspecified shoulder: Principal | ICD-10-CM | POA: Diagnosis present

## 2012-07-05 DIAGNOSIS — Z888 Allergy status to other drugs, medicaments and biological substances status: Secondary | ICD-10-CM

## 2012-07-05 DIAGNOSIS — Z87891 Personal history of nicotine dependence: Secondary | ICD-10-CM

## 2012-07-05 DIAGNOSIS — Z8249 Family history of ischemic heart disease and other diseases of the circulatory system: Secondary | ICD-10-CM

## 2012-07-05 DIAGNOSIS — Z833 Family history of diabetes mellitus: Secondary | ICD-10-CM

## 2012-07-05 DIAGNOSIS — F3289 Other specified depressive episodes: Secondary | ICD-10-CM | POA: Diagnosis present

## 2012-07-05 DIAGNOSIS — Z7982 Long term (current) use of aspirin: Secondary | ICD-10-CM

## 2012-07-05 DIAGNOSIS — Z01812 Encounter for preprocedural laboratory examination: Secondary | ICD-10-CM

## 2012-07-05 DIAGNOSIS — J4489 Other specified chronic obstructive pulmonary disease: Secondary | ICD-10-CM | POA: Diagnosis present

## 2012-07-05 DIAGNOSIS — Z79899 Other long term (current) drug therapy: Secondary | ICD-10-CM

## 2012-07-05 DIAGNOSIS — G4700 Insomnia, unspecified: Secondary | ICD-10-CM | POA: Diagnosis present

## 2012-07-05 DIAGNOSIS — Z882 Allergy status to sulfonamides status: Secondary | ICD-10-CM

## 2012-07-05 DIAGNOSIS — H3530 Unspecified macular degeneration: Secondary | ICD-10-CM | POA: Diagnosis present

## 2012-07-05 DIAGNOSIS — J449 Chronic obstructive pulmonary disease, unspecified: Secondary | ICD-10-CM | POA: Diagnosis present

## 2012-07-05 DIAGNOSIS — K219 Gastro-esophageal reflux disease without esophagitis: Secondary | ICD-10-CM | POA: Diagnosis present

## 2012-07-05 HISTORY — PX: TOTAL SHOULDER ARTHROPLASTY: SHX126

## 2012-07-05 SURGERY — ARTHROPLASTY, SHOULDER, TOTAL
Anesthesia: Regional | Site: Shoulder | Laterality: Right | Wound class: Clean

## 2012-07-05 MED ORDER — ONDANSETRON HCL 4 MG PO TABS: 4.0000 mg | ORAL_TABLET | Freq: Four times a day (QID) | ORAL | Status: DC | PRN

## 2012-07-05 MED ORDER — METHOCARBAMOL 100 MG/ML IJ SOLN
500.0000 mg | Freq: Four times a day (QID) | INTRAVENOUS | Status: DC | PRN
  Filled 2012-07-05: qty 5

## 2012-07-05 MED ORDER — NEOSTIGMINE METHYLSULFATE 1 MG/ML IJ SOLN
INTRAMUSCULAR | Status: DC | PRN
  Administered 2012-07-05: 3 mg via INTRAVENOUS

## 2012-07-05 MED ORDER — ACETAMINOPHEN 10 MG/ML IV SOLN
1000.0000 mg | Freq: Once | INTRAVENOUS | Status: AC
  Administered 2012-07-05: 1000 mg via INTRAVENOUS
  Filled 2012-07-05: qty 100

## 2012-07-05 MED ORDER — ESCITALOPRAM OXALATE 20 MG PO TABS
20.0000 mg | ORAL_TABLET | Freq: Every day | ORAL | Status: DC
  Administered 2012-07-05: 20 mg via ORAL
  Filled 2012-07-05 (×2): qty 1

## 2012-07-05 MED ORDER — MENTHOL 3 MG MT LOZG: 1.0000 | LOZENGE | OROMUCOSAL | Status: DC | PRN

## 2012-07-05 MED ORDER — POLYETHYLENE GLYCOL 3350 17 G PO PACK: 17.0000 g | PACK | Freq: Every day | ORAL | Status: DC | PRN

## 2012-07-05 MED ORDER — METOCLOPRAMIDE HCL 10 MG PO TABS: 5.0000 mg | ORAL_TABLET | Freq: Three times a day (TID) | ORAL | Status: DC | PRN

## 2012-07-05 MED ORDER — ACETAMINOPHEN 650 MG RE SUPP: 650.0000 mg | Freq: Four times a day (QID) | RECTAL | Status: DC | PRN

## 2012-07-05 MED ORDER — DOCUSATE SODIUM 100 MG PO CAPS
100.0000 mg | ORAL_CAPSULE | Freq: Two times a day (BID) | ORAL | Status: DC
  Administered 2012-07-05 – 2012-07-06 (×3): 100 mg via ORAL
  Filled 2012-07-05 (×3): qty 1

## 2012-07-05 MED ORDER — HYDROMORPHONE HCL PF 1 MG/ML IJ SOLN: 0.2500 mg | INTRAMUSCULAR | Status: DC | PRN

## 2012-07-05 MED ORDER — HYDROMORPHONE HCL PF 1 MG/ML IJ SOLN
0.2500 mg | INTRAMUSCULAR | Status: DC | PRN
  Administered 2012-07-05: 0.5 mg via INTRAVENOUS
  Administered 2012-07-05: 1 mg via INTRAVENOUS
  Filled 2012-07-05 (×2): qty 1

## 2012-07-05 MED ORDER — LACTATED RINGERS IV SOLN
INTRAVENOUS | Status: DC
Start: 2012-07-05 — End: 2012-07-05

## 2012-07-05 MED ORDER — KETOROLAC TROMETHAMINE 15 MG/ML IJ SOLN
15.0000 mg | Freq: Four times a day (QID) | INTRAMUSCULAR | Status: DC
  Administered 2012-07-05 – 2012-07-06 (×4): 15 mg via INTRAVENOUS
  Filled 2012-07-05 (×8): qty 1

## 2012-07-05 MED ORDER — KETOROLAC TROMETHAMINE 30 MG/ML IJ SOLN
INTRAMUSCULAR | Status: AC
  Filled 2012-07-05: qty 1

## 2012-07-05 MED ORDER — MIDAZOLAM HCL 5 MG/5ML IJ SOLN
INTRAMUSCULAR | Status: DC | PRN
  Administered 2012-07-05: 1 mg via INTRAVENOUS

## 2012-07-05 MED ORDER — METOCLOPRAMIDE HCL 5 MG/ML IJ SOLN: 5.0000 mg | Freq: Three times a day (TID) | INTRAMUSCULAR | Status: DC | PRN

## 2012-07-05 MED ORDER — LACTATED RINGERS IV SOLN
INTRAVENOUS | Status: DC | PRN
  Administered 2012-07-05 (×2): via INTRAVENOUS

## 2012-07-05 MED ORDER — ROPIVACAINE HCL 5 MG/ML IJ SOLN
INTRAMUSCULAR | Status: DC | PRN
  Administered 2012-07-05: 125 mg

## 2012-07-05 MED ORDER — OXYCODONE-ACETAMINOPHEN 5-325 MG PO TABS
1.0000 | ORAL_TABLET | ORAL | Status: DC | PRN
  Administered 2012-07-06: 2 via ORAL
  Filled 2012-07-05: qty 2

## 2012-07-05 MED ORDER — ACETAMINOPHEN 325 MG PO TABS: 650.0000 mg | ORAL_TABLET | Freq: Four times a day (QID) | ORAL | Status: DC | PRN

## 2012-07-05 MED ORDER — ROCURONIUM BROMIDE 100 MG/10ML IV SOLN
INTRAVENOUS | Status: DC | PRN
  Administered 2012-07-05: 40 mg via INTRAVENOUS

## 2012-07-05 MED ORDER — ACETAMINOPHEN 10 MG/ML IV SOLN
INTRAVENOUS | Status: AC
  Filled 2012-07-05: qty 100

## 2012-07-05 MED ORDER — LACTATED RINGERS IV SOLN
INTRAVENOUS | Status: DC
  Administered 2012-07-05: 50 mL/h via INTRAVENOUS

## 2012-07-05 MED ORDER — ESMOLOL HCL 10 MG/ML IV SOLN
INTRAVENOUS | Status: DC | PRN
  Administered 2012-07-05 (×2): 20 mg via INTRAVENOUS

## 2012-07-05 MED ORDER — LACTATED RINGERS IV SOLN
INTRAVENOUS | Status: DC
  Administered 2012-07-06: 06:00:00 via INTRAVENOUS

## 2012-07-05 MED ORDER — PHENOL 1.4 % MT LIQD: 1.0000 | OROMUCOSAL | Status: DC | PRN

## 2012-07-05 MED ORDER — SODIUM CHLORIDE 0.9 % IR SOLN
Status: DC | PRN
  Administered 2012-07-05: 1000 mL

## 2012-07-05 MED ORDER — ASPIRIN EC 81 MG PO TBEC
81.0000 mg | DELAYED_RELEASE_TABLET | Freq: Every day | ORAL | Status: DC
  Administered 2012-07-05 – 2012-07-06 (×2): 81 mg via ORAL
  Filled 2012-07-05 (×3): qty 1

## 2012-07-05 MED ORDER — ONDANSETRON HCL 4 MG/2ML IJ SOLN: 4.0000 mg | Freq: Four times a day (QID) | INTRAMUSCULAR | Status: DC | PRN

## 2012-07-05 MED ORDER — ALUM & MAG HYDROXIDE-SIMETH 200-200-20 MG/5ML PO SUSP: 30.0000 mL | ORAL | Status: DC | PRN

## 2012-07-05 MED ORDER — PROMETHAZINE HCL 25 MG/ML IJ SOLN: 6.2500 mg | INTRAMUSCULAR | Status: DC | PRN

## 2012-07-05 MED ORDER — METHOCARBAMOL 500 MG PO TABS: 500.0000 mg | ORAL_TABLET | Freq: Four times a day (QID) | ORAL | Status: DC | PRN

## 2012-07-05 MED ORDER — ONDANSETRON HCL 4 MG/2ML IJ SOLN
INTRAMUSCULAR | Status: DC | PRN
  Administered 2012-07-05: 4 mg via INTRAVENOUS

## 2012-07-05 MED ORDER — DIPHENHYDRAMINE HCL 12.5 MG/5ML PO ELIX: 12.5000 mg | ORAL_SOLUTION | ORAL | Status: DC | PRN

## 2012-07-05 MED ORDER — DEXAMETHASONE SODIUM PHOSPHATE 4 MG/ML IJ SOLN
INTRAMUSCULAR | Status: DC | PRN
  Administered 2012-07-05: 8 mg

## 2012-07-05 MED ORDER — TRAVOPROST (BAK FREE) 0.004 % OP SOLN
1.0000 [drp] | Freq: Every day | OPHTHALMIC | Status: DC
  Administered 2012-07-05: 1 [drp] via OPHTHALMIC
  Filled 2012-07-05: qty 2.5

## 2012-07-05 MED ORDER — CEFAZOLIN SODIUM 1-5 GM-% IV SOLN
1.0000 g | Freq: Four times a day (QID) | INTRAVENOUS | Status: AC
  Administered 2012-07-05 – 2012-07-06 (×3): 1 g via INTRAVENOUS
  Filled 2012-07-05 (×3): qty 50

## 2012-07-05 MED ORDER — TRAVOPROST 0.004 % OP SOLN
1.0000 [drp] | Freq: Every day | OPHTHALMIC | Status: DC
  Filled 2012-07-05 (×9): qty 0.1

## 2012-07-05 MED ORDER — FLEET ENEMA 7-19 GM/118ML RE ENEM: 1.0000 | ENEMA | Freq: Once | RECTAL | Status: AC | PRN

## 2012-07-05 MED ORDER — PROPOFOL 10 MG/ML IV BOLUS
INTRAVENOUS | Status: DC | PRN
  Administered 2012-07-05: 150 mg via INTRAVENOUS

## 2012-07-05 MED ORDER — ZOLPIDEM TARTRATE 5 MG PO TABS
5.0000 mg | ORAL_TABLET | Freq: Every evening | ORAL | Status: DC | PRN
  Administered 2012-07-05: 5 mg via ORAL
  Filled 2012-07-05: qty 1

## 2012-07-05 MED ORDER — EPHEDRINE SULFATE 50 MG/ML IJ SOLN
INTRAMUSCULAR | Status: DC | PRN
  Administered 2012-07-05: 10 mg via INTRAVENOUS

## 2012-07-05 MED ORDER — GLYCOPYRROLATE 0.2 MG/ML IJ SOLN
INTRAMUSCULAR | Status: DC | PRN
  Administered 2012-07-05: .4 mg via INTRAVENOUS

## 2012-07-05 MED ORDER — BISACODYL 5 MG PO TBEC: 5.0000 mg | DELAYED_RELEASE_TABLET | Freq: Every day | ORAL | Status: DC | PRN

## 2012-07-05 MED ORDER — FENTANYL CITRATE 0.05 MG/ML IJ SOLN
INTRAMUSCULAR | Status: DC | PRN
  Administered 2012-07-05: 50 ug via INTRAVENOUS

## 2012-07-05 MED ORDER — PANTOPRAZOLE SODIUM 40 MG PO TBEC
40.0000 mg | DELAYED_RELEASE_TABLET | Freq: Every day | ORAL | Status: DC
  Administered 2012-07-05: 40 mg via ORAL
  Filled 2012-07-05: qty 1

## 2012-07-05 SURGICAL SUPPLY — 68 items
BLADE SAW SGTL 83.5X18.5 (BLADE) ×2 IMPLANT
CEMENT BONE DEPUY (Cement) ×1 IMPLANT
CLOTH BEACON ORANGE TIMEOUT ST (SAFETY) ×2 IMPLANT
CLSR STERI-STRIP ANTIMIC 1/2X4 (GAUZE/BANDAGES/DRESSINGS) ×1 IMPLANT
COVER SURGICAL LIGHT HANDLE (MISCELLANEOUS) ×2 IMPLANT
DRAPE INCISE IOBAN 66X45 STRL (DRAPES) ×2 IMPLANT
DRAPE SURG 17X11 SM STRL (DRAPES) ×2 IMPLANT
DRAPE SURG 17X23 STRL (DRAPES) ×2 IMPLANT
DRAPE U-SHAPE 47X51 STRL (DRAPES) ×2 IMPLANT
DRILL BIT 7/64X5 (BIT) IMPLANT
DRSG AQUACEL AG ADV 3.5X10 (GAUZE/BANDAGES/DRESSINGS) ×2 IMPLANT
DRSG MEPILEX BORDER 4X8 (GAUZE/BANDAGES/DRESSINGS) ×1 IMPLANT
DURAPREP 26ML APPLICATOR (WOUND CARE) ×4 IMPLANT
ELECT BLADE 4.0 EZ CLEAN MEGAD (MISCELLANEOUS) ×2
ELECT CAUTERY BLADE 6.4 (BLADE) ×2 IMPLANT
ELECT REM PT RETURN 9FT ADLT (ELECTROSURGICAL) ×2
ELECTRODE BLDE 4.0 EZ CLN MEGD (MISCELLANEOUS) ×1 IMPLANT
ELECTRODE REM PT RTRN 9FT ADLT (ELECTROSURGICAL) ×1 IMPLANT
FACESHIELD LNG OPTICON STERILE (SAFETY) ×6 IMPLANT
GLENOID ANCHOR PEG CROSSLK 40 (Orthopedic Implant) ×1 IMPLANT
GLOVE BIO SURGEON STRL SZ7.5 (GLOVE) ×2 IMPLANT
GLOVE BIO SURGEON STRL SZ8 (GLOVE) ×2 IMPLANT
GLOVE BIO SURGEON STRL SZ8.5 (GLOVE) ×1 IMPLANT
GLOVE BIOGEL PI IND STRL 7.0 (GLOVE) IMPLANT
GLOVE BIOGEL PI INDICATOR 7.0 (GLOVE) ×3
GLOVE EUDERMIC 7 POWDERFREE (GLOVE) ×2 IMPLANT
GLOVE SS BIOGEL STRL SZ 7.5 (GLOVE) ×1 IMPLANT
GLOVE SUPERSENSE BIOGEL SZ 7.5 (GLOVE) ×2
GLOVE SURG SS PI 8.5 STRL IVOR (GLOVE) ×1
GLOVE SURG SS PI 8.5 STRL STRW (GLOVE) IMPLANT
GOWN STRL NON-REIN LRG LVL3 (GOWN DISPOSABLE) ×2 IMPLANT
GOWN STRL REIN XL XLG (GOWN DISPOSABLE) ×5 IMPLANT
HEAD HUM ECCENTRIC 44X18 STRL (Trauma) ×1 IMPLANT
HUMERAL STEM 10MM (Trauma) ×2 IMPLANT
KIT BASIN OR (CUSTOM PROCEDURE TRAY) ×2 IMPLANT
KIT ROOM TURNOVER OR (KITS) ×2 IMPLANT
MANIFOLD NEPTUNE II (INSTRUMENTS) ×2 IMPLANT
NDL HYPO 25GX1X1/2 BEV (NEEDLE) IMPLANT
NDL SUT 6 .5 CRC .975X.05 MAYO (NEEDLE) ×1 IMPLANT
NEEDLE HYPO 25GX1X1/2 BEV (NEEDLE) IMPLANT
NEEDLE MAYO TAPER (NEEDLE)
NS IRRIG 1000ML POUR BTL (IV SOLUTION) ×2 IMPLANT
PACK SHOULDER (CUSTOM PROCEDURE TRAY) ×2 IMPLANT
PAD ARMBOARD 7.5X6 YLW CONV (MISCELLANEOUS) ×4 IMPLANT
PASSER SUT SWANSON 36MM LOOP (INSTRUMENTS) IMPLANT
PIN METAGLENE 2.5 (PIN) ×2 IMPLANT
SLING ARM FOAM STRAP LRG (SOFTGOODS) IMPLANT
SLING ARM FOAM STRAP MED (SOFTGOODS) ×1 IMPLANT
SLING ARM FOAM STRAP XLG (SOFTGOODS) ×1 IMPLANT
SMARTMIX MINI TOWER (MISCELLANEOUS) ×2
SPONGE LAP 18X18 X RAY DECT (DISPOSABLE) ×2 IMPLANT
SPONGE LAP 4X18 X RAY DECT (DISPOSABLE) ×2 IMPLANT
STEM HUMERAL 10MM (Trauma) IMPLANT
STRIP CLOSURE SKIN 1/2X4 (GAUZE/BANDAGES/DRESSINGS) ×1 IMPLANT
SUCTION FRAZIER TIP 10 FR DISP (SUCTIONS) ×2 IMPLANT
SUT BONE WAX W31G (SUTURE) IMPLANT
SUT FIBERWIRE #2 38 T-5 BLUE (SUTURE) ×6
SUT MNCRL AB 3-0 PS2 18 (SUTURE) ×2 IMPLANT
SUT VIC AB 1 CT1 27 (SUTURE) ×4
SUT VIC AB 1 CT1 27XBRD ANBCTR (SUTURE) ×3 IMPLANT
SUT VIC AB 2-0 CT1 27 (SUTURE) ×2
SUT VIC AB 2-0 CT1 TAPERPNT 27 (SUTURE) ×2 IMPLANT
SUTURE FIBERWR #2 38 T-5 BLUE (SUTURE) ×2 IMPLANT
SYR CONTROL 10ML LL (SYRINGE) IMPLANT
TOWEL OR 17X24 6PK STRL BLUE (TOWEL DISPOSABLE) ×2 IMPLANT
TOWEL OR 17X26 10 PK STRL BLUE (TOWEL DISPOSABLE) ×2 IMPLANT
TOWER SMARTMIX MINI (MISCELLANEOUS) ×1 IMPLANT
WATER STERILE IRR 1000ML POUR (IV SOLUTION) ×2 IMPLANT

## 2012-07-05 NOTE — Anesthesia Procedure Notes (Addendum)
Anesthesia Regional Block:  Interscalene brachial plexus block  Pre-Anesthetic Checklist: ,, timeout performed, Correct Patient, Correct Site, Correct Laterality, Correct Procedure, Correct Position, site marked, Risks and benefits discussed,  Surgical consent,  Pre-op evaluation,  At surgeon's request and post-op pain management  Laterality: Right  Prep: chloraprep       Needles:  Injection technique: Single-shot  Needle Type: Echogenic Stimulator Needle     Needle Length: 5cm 5 cm Needle Gauge: 22 and 22 G    Additional Needles:  Procedures: ultrasound guided (picture in chart) and nerve stimulator Interscalene brachial plexus block  Nerve Stimulator or Paresthesia:  Response: bicep contraction, 0.45 mA,   Additional Responses:   Narrative:  Start time: 07/05/2012 9:10 AM End time: 07/05/2012 9:20 AM Injection made incrementally with aspirations every 5 mL.  Performed by: Personally  Anesthesiologist: J. Tamela Gammon, MD  Additional Notes: Functioning IV was confirmed and monitors applied.  A 31mm 22ga echogenic arrow stimulator was used. Sterile prep and drape,hand hygiene and sterile gloves were used.Ultrasound guidance: relevant anatomy identified, needle position confirmed, local anesthetic spread visualized around nerve(s)., vascular puncture avoided.  Image printed for medical record.  Negative aspiration and negative test dose prior to incremental administration of local anesthetic. The patient tolerated the procedure well.  Interscalene brachial plexus block Procedure Name: Intubation Date/Time: 07/05/2012 10:38 AM Performed by: Carney Living Pre-anesthesia Checklist: Patient identified, Emergency Drugs available, Suction available, Patient being monitored and Timeout performed Patient Re-evaluated:Patient Re-evaluated prior to inductionOxygen Delivery Method: Circle system utilized Preoxygenation: Pre-oxygenation with 100% oxygen Intubation Type: IV  induction Ventilation: Mask ventilation without difficulty Laryngoscope Size: Mac and 4 Grade View: Grade III Tube type: Oral Tube size: 7.0 mm Number of attempts: 2 Airway Equipment and Method: Stylet Placement Confirmation: positive ETCO2 and breath sounds checked- equal and bilateral Secured at: 20 cm Tube secured with: Tape Dental Injury: Teeth and Oropharynx as per pre-operative assessment

## 2012-07-05 NOTE — Preoperative (Signed)
Beta Blockers   Reason not to administer Beta Blockers:Not Applicable 

## 2012-07-05 NOTE — Anesthesia Preprocedure Evaluation (Addendum)
Anesthesia Evaluation  Patient identified by MRN, date of birth, ID band Patient awake    Reviewed: Allergy & Precautions, H&P , NPO status , Patient's Chart, lab work & pertinent test results  History of Anesthesia Complications Negative for: history of anesthetic complications  Airway Mallampati: II TM Distance: >3 FB Neck ROM: Full    Dental  (+) Teeth Intact and Dental Advisory Given   Pulmonary shortness of breath and with exertion, COPDformer smoker,    Pulmonary exam normal       Cardiovascular negative cardio ROS      Neuro/Psych PSYCHIATRIC DISORDERS Depression Trigeminal neuralgia  Neuromuscular disease    GI/Hepatic Neg liver ROS, GERD-  Medicated and Controlled,  Endo/Other  negative endocrine ROS  Renal/GU      Musculoskeletal  (+) Arthritis -, Osteoarthritis,    Abdominal   Peds  Hematology   Anesthesia Other Findings   Reproductive/Obstetrics                         Anesthesia Physical Anesthesia Plan  ASA: III  Anesthesia Plan: General   Post-op Pain Management:    Induction: Intravenous  Airway Management Planned: Oral ETT  Additional Equipment:   Intra-op Plan:   Post-operative Plan: Extubation in OR  Informed Consent: I have reviewed the patients History and Physical, chart, labs and discussed the procedure including the risks, benefits and alternatives for the proposed anesthesia with the patient or authorized representative who has indicated his/her understanding and acceptance.   Dental advisory given  Plan Discussed with: CRNA, Anesthesiologist and Surgeon  Anesthesia Plan Comments:        Anesthesia Quick Evaluation

## 2012-07-05 NOTE — Progress Notes (Signed)
UR COMPLETED  

## 2012-07-05 NOTE — Op Note (Signed)
07/05/2012  12:12 PM  PATIENT:   [name]  79 y.o. female  PRE-OPERATIVE DIAGNOSIS:  OA RIGHT SHOULDER   POST-OPERATIVE DIAGNOSIS:  same  PROCEDURE:  R TSA 10 stem, 44X18 eccentric head, 44 glenoid  SURGEON:  Supple, Metta Clines M.D.  ASSISTANTS: Shuford pac   ANESTHESIA:   GET + ISB  EBL: 200  SPECIMEN:  none  Drains: none   PATIENT DISPOSITION:  PACU - hemodynamically stable.    PLAN OF CARE: Admit to inpatient   Dictation# [id number]

## 2012-07-05 NOTE — Transfer of Care (Signed)
Immediate Anesthesia Transfer of Care Note  Patient: [name]  Procedure(s) Performed: Procedure(s) with comments: RIGHT TOTAL SHOULDER ARTHROPLASTY (Right) - Right total shoulder arthroplasty  Patient Location: PACU  Anesthesia Type:GA combined with regional for post-op pain  Level of Consciousness: awake, alert , oriented and patient cooperative  Airway & Oxygen Therapy: Patient Spontanous Breathing and Patient connected to nasal cannula oxygen  Post-op Assessment: Report given to PACU RN, Post -op Vital signs reviewed and stable and Patient moving all extremities X 4  Post vital signs: Reviewed and stable  Complications: No apparent anesthesia complications

## 2012-07-05 NOTE — H&P (Signed)
[name]    Chief Complaint: OA RIGHT SHOULDER  HPI: The patient is a 79 y.o. female with end stage right shoulder OA  Past Medical History  Diagnosis Date  . Lack of bladder control   . Renal cyst     right  . Macular degeneration   . Glaucoma(365)   . Arthritis   . Insomnia   . Small bowel obstruction   . Gastritis   . Degenerative joint disease   . Constipation   . Trigeminal neuralgia   . OA (osteoarthritis)   . GERD (gastroesophageal reflux disease)   . Depression   . COPD, moderate   . Allergy   . Adenomatous colon polyp 04/26/95    tubulovillous  . Shortness of breath     Past Surgical History  Procedure Laterality Date  . Tonsillectomy    . Dilation and curettage of uterus    . Abdominal hysterectomy      nonmalignant reasons  . Joint replacement      right hip  . Cholecystectomy    . Partial colectomy  04/26/95    tubulovillous adenoma  . Back surgery  05/20/08  . Cataract extraction      left  . Shoulder surgery  12/24/08    left    Family History  Problem Relation Age of Onset  . Cancer Father     colon  . Heart disease Sister   . Colon polyps Brother   . Diabetes Brother     Social History:  reports that she quit smoking about 7 years ago. Her smoking use included Cigarettes. She has a 50 pack-year smoking history. She does not have any smokeless tobacco history on file. She reports that she does not drink alcohol or use illicit drugs.  Allergies:  Allergies  Allergen Reactions  . Codeine Itching and Nausea Only    Small amounts okay  . Pregabalin Other (See Comments)    Confusion and hallucination  . Promethazine Hcl Other (See Comments)    Muscle cramps  . Sulfonamide Derivatives Nausea Only    Medications Prior to Admission  Medication Sig Dispense Refill  . aspirin EC 81 MG tablet Take 81 mg by mouth daily.      Marland Kitchen BIOTIN PO Take 1 tablet by mouth daily.      Marland Kitchen escitalopram (LEXAPRO) 20 MG tablet Take 20 mg by mouth at bedtime.       Marland Kitchen HYDROcodone-acetaminophen (NORCO) 10-325 MG per tablet Take 0.5-1 tablets by mouth 2 (two) times daily as needed for pain.      Marland Kitchen omeprazole (PRILOSEC) 20 MG capsule Take 20 mg by mouth daily.      Marland Kitchen OVER THE COUNTER MEDICATION Take 1 tablet by mouth 2 (two) times daily. preservision vitamin for eye care      . travoprost, benzalkonium, (TRAVATAN) 0.004 % ophthalmic solution Place 1 drop into both eyes at bedtime.       Marland Kitchen zolpidem (AMBIEN) 5 MG tablet Take 1 tablet (5 mg total) by mouth at bedtime as needed for sleep.  90 tablet  1     Physical Exam: right shoulder with painful and restricted motion as noted at recent office visit  Vitals  Temp:  [98.1 F (36.7 C)] 98.1 F (36.7 C) (03/13 0817) Pulse Rate:  [59-69] 69 (03/13 0923) Resp:  [17-19] 19 (03/13 0923) BP: (149)/(81) 149/81 mmHg (03/13 0817) SpO2:  [96 %-98 %] 98 % (03/13 0923)  Assessment/Plan  Impression: OA RIGHT  SHOULDER   Plan of Action: Procedure(s): RIGHT TOTAL SHOULDER ARTHROPLASTY  Migel Hannis M 07/05/2012, 9:50 AM

## 2012-07-05 NOTE — Anesthesia Postprocedure Evaluation (Signed)
Anesthesia Post Note  Patient: [name]  Procedure(s) Performed: Procedure(s) (LRB): RIGHT TOTAL SHOULDER ARTHROPLASTY (Right)  Anesthesia type: general  Patient location: PACU  Post pain: Pain level controlled  Post assessment: Patient's Cardiovascular Status Stable  Last Vitals:  Filed Vitals:   07/05/12 1347  BP:   Pulse:   Temp: 36.4 C  Resp:     Post vital signs: Reviewed and stable  Level of consciousness: sedated  Complications: No apparent anesthesia complications

## 2012-07-06 MED ORDER — ZOLPIDEM TARTRATE 5 MG PO TABS: 5.0000 mg | ORAL_TABLET | Freq: Every evening | ORAL | Status: DC | PRN

## 2012-07-06 MED ORDER — HYDROCODONE-ACETAMINOPHEN 10-325 MG PO TABS: 0.5000 | ORAL_TABLET | ORAL | Status: DC | PRN

## 2012-07-06 NOTE — Progress Notes (Signed)
CARE MANAGEMENT NOTE 07/06/2012  Patient:  [name], [name]   Account Number:  [id number]  Date Initiated:  07/06/2012  Documentation initiated by:  Ricki Miller  Subjective/Objective Assessment:   79 yr old female s/p right total shoulder arthroplasty.     Action/Plan:   CM spoke with patient concerning home health and DME needs at discharge. Patient states her daughter will help and she has friends. Preoperatively setup with [facility], no changes.   Anticipated DC Date:  07/06/2012   Anticipated DC Plan:  [place]  CM consult      [facility] Choice  HOME HEALTH   Choice offered to / List presented to:  C-1 Patient        [place] arranged  [place]   Status of service:  Completed, signed off Medicare Important Message given?   (If response is "NO", the following Medicare IM given date fields will be blank) Date Medicare IM given:   Date Additional Medicare IM given:    Discharge Disposition:  [place]  Per UR Regulation:    If discussed at Long Length of Stay Meetings, dates discussed:    Comments:

## 2012-07-06 NOTE — Progress Notes (Signed)
Occupational Therapy Evaluation Patient Details Name: [name] MRN: [id number] DOB: November 03, 1925 Today's Date: 07/06/2012 Time: [id number] OT Time Calculation (min): 78 min  OT Assessment / Plan / Recommendation Clinical Impression  Pt. ed. to use cane for all mobility initially. Pt. to have 24 hours S initially until balance improves. Pt. ed. on SHLD protocol and given handout. Pt. has had previous SHLD sx and understands protocol. Pt. having diffuculty performing pendulums secondary to hip and back pain.  (Pt. with unsteady gait and instructed pt. to use cane.)    OT Assessment  Progress rehab of shoulder as ordered by MD at follow-up appointment    Follow Up Recommendations  Home health OT    Barriers to Discharge      Equipment Recommendations       Recommendations for Other Services    Frequency       Precautions / Restrictions Precautions Precautions: Shoulder Type of Shoulder Precautions:  (Dr. supple ) Precaution Comments:  (SHLD protocol Dr. supple) Restrictions Other Position/Activity Restrictions:  (R SHLD NWB)   Pertinent Vitals/Pain     ADL  Eating/Feeding: Simulated;Modified independent Where Assessed - Eating/Feeding: Edge of bed Grooming: Performed;Wash/dry hands;Wash/dry face;Supervision/safety Where Assessed - Grooming: Unsupported standing Upper Body Bathing: Simulated;Supervision/safety Where Assessed - Upper Body Bathing: Unsupported sitting Lower Body Bathing: Simulated;Set up Where Assessed - Lower Body Bathing: Unsupported sitting;Unsupported standing Upper Body Dressing: Performed;Minimal assistance Where Assessed - Upper Body Dressing: Unsupported sitting Lower Body Dressing: Performed;Modified independent;Set up Where Assessed - Lower Body Dressing: Unsupported sitting;Unsupported standing Toilet Transfer: Chartered loss adjuster Method: Sit to Loss adjuster, chartered: Comfort height toilet Toileting -  Water quality scientist and Hygiene: Performed;Supervision/safety Where Assessed - Best boy and Hygiene: Sit to stand from 3-in-1 or toilet Transfers/Ambulation Related to ADLs:  (Pt. Min  Guard assist with AMB in room with unsteady gait.) ADL Comments:  (Instructed pt. to use cane with all AMB in home.)    OT Diagnosis:    OT Problem List:   OT Treatment Interventions:     OT Goals    Visit Information  Last OT Received On: 07/06/12 Assistance Needed: +1    Subjective Data  Subjective:  (Pt. agreeable to OT) Patient Stated Goal:  (go home)   Prior [place] Lives With: Alone Available Help at Discharge: Family Type of Home: House Home Access: Stairs to enter [employer] of Steps:  (2 steps) Home Layout: Two level;Able to live on main level with bedroom/bathroom Bathroom Shower/Tub: Chiropodist: Standard Bathroom Accessibility: Yes Home Adaptive Equipment: Grab bars in shower;Shower chair without back;Straight cane Prior Function Level of Independence: Independent with assistive device(s) Able to Take Stairs?: Yes Driving: Yes Vocation: Retired Corporate investment banker: No difficulties Dominant Hand: Right         Vision/Perception Vision - History Baseline Vision: Wears glasses all the time Visual History: Glaucoma;Macular degeneration;Other (comment) Patient Visual Report:  (Pt. has had eye sx secondary to hemmorage in eye.)   Cognition  Cognition Overall Cognitive Status: Appears within functional limits for tasks assessed/performed Arousal/Alertness: Awake/alert Orientation Level: Appears intact for tasks assessed Behavior During Session: [facility] for tasks performed    Extremity/Trunk Assessment Right Upper Extremity Assessment RUE ROM/Strength/Tone: Deficits RUE ROM/Strength/Tone Deficits:  (Pt. not allowed AROM. Pt. performed pendulums) Left Upper Extremity Assessment LUE  ROM/Strength/Tone: Within functional levels     Mobility Bed Mobility Bed Mobility: Left Sidelying to Sit;Sit to Sidelying Left Transfers Transfers: Sit to  Stand;Stand to Sit Sit to Stand: 6: Modified independent (Device/Increase time) Stand to Sit: 6: Modified independent (Device/Increase time)     Exercise Other Exercises Other Exercises:  (elbow, wrist, hand AROM, Pendulum for SHLD)   Balance     End of Session OT - End of Session Equipment Utilized During Treatment: Gait belt Activity Tolerance: Patient tolerated treatment well Patient left: in bed Nurse Communication: Mobility status  GO     [name] 07/06/2012, 9:21 AM

## 2012-07-06 NOTE — Discharge Summary (Signed)
PATIENT ID:      [name]  MRN:     [id number] DOB/AGE:    [birth year] / 79 y.o.     DISCHARGE SUMMARY  ADMISSION DATE:    07/05/2012 DISCHARGE DATE:  07/06/12  ADMISSION DIAGNOSIS: OA RIGHT SHOULDER  Past Medical History  Diagnosis Date  . Lack of bladder control   . Renal cyst     right  . Macular degeneration   . Glaucoma(365)   . Arthritis   . Insomnia   . Small bowel obstruction   . Gastritis   . Degenerative joint disease   . Constipation   . Trigeminal neuralgia   . OA (osteoarthritis)   . GERD (gastroesophageal reflux disease)   . Depression   . COPD, moderate   . Allergy   . Adenomatous colon polyp 04/26/95    tubulovillous  . Shortness of breath     DISCHARGE DIAGNOSIS:   Active Problems:   * No active hospital problems. *   PROCEDURE: Procedure(s): RIGHT TOTAL SHOULDER ARTHROPLASTY on 07/05/2012  CONSULTS:   none  HISTORY:  See H&P in chart.  HOSPITAL COURSE:  [name] is a 79 y.o. admitted on 07/05/2012 with a chief complaint of Right shoulder pain, and found to have a diagnosis of OA RIGHT SHOULDER .  They were brought to the operating room on 07/05/2012 and underwent Procedure(s): RIGHT TOTAL SHOULDER ARTHROPLASTY.    They were given perioperative antibiotics: Anti-infectives   Start     Dose/Rate Route Frequency Ordered Stop   07/05/12 1630  ceFAZolin (ANCEF) IVPB 1 g/50 mL premix     1 g 100 mL/hr over 30 Minutes Intravenous Every 6 hours 07/05/12 1429 07/06/12 0602   07/05/12 0600  ceFAZolin (ANCEF) IVPB 2 g/50 mL premix     2 g 100 mL/hr over 30 Minutes Intravenous On call to O.R. 07/04/12 1419 07/05/12 1040    .  Patient underwent the above named procedure and tolerated it well. The following day they were hemodynamically stable and pain was controlled on oral analgesics. They were neurovascularly intact to the operative extremity. OT was ordered and worked with patient per protocol. They were medically and orthopaedically stable  for discharge on .   Home health arrangements were made  DIAGNOSTIC STUDIES:  RECENT RADIOGRAPHIC STUDIES :  Dg Chest 2 View  06/28/2012  *RADIOLOGY REPORT*  Clinical Data: Right shoulder arthroplasty.  CHEST - 2 VIEW  Comparison: 08/30/2010.  Findings: Trachea is midline.  Heart size normal.  Biapical pleural thickening.  Lungs are hyperinflated.  Minimal linear scarring at the lung bases.  Lungs are otherwise clear.  No pleural fluid. Left shoulder arthroplasties incidentally noted.  IMPRESSION: COPD without acute finding.   Original Report Authenticated By: Lorin Picket, M.D.    Mr Lumbar Spine W Wo Contrast  06/25/2012  **ADDENDUM** CREATED: 06/25/2012 13:32:54  BUN and creatinine were obtained on site at [facility] at [address] Results:  BUN 18 mg/dL,  Creatinine 1.0 mg/dL.  **END ADDENDUM** SIGNED BY: Elta Guadeloupe E. Maree Erie, M.D.   06/25/2012  *RADIOLOGY REPORT*  Clinical Data: Thoracic and lumbosacral neuritis.  Left flank and leg pain and numbness.  MRI LUMBAR SPINE WITHOUT AND WITH CONTRAST  Technique:  Multiplanar and multiecho pulse sequences of the lumbar spine were obtained without and with intravenous contrast.  Contrast: 59mL MULTIHANCE GADOBENATE DIMEGLUMINE 529 MG/ML IV SOLN  Comparison: 02/08/2010  Findings: There is curvature convex to the left with the apex  at L3.  There is no significant finding at L1-2 or above.  L2-3:  Desiccation and mild bulging of the disc.  No canal or foraminal stenosis.  No facet edema.  L3-4:  Previous discectomy and fusion from a lateral approach. Wide patency of the canal and foramina.  L4-5:  Facet arthropathy with 2 mm of anterolisthesis.  Disc degeneration with loss of height.  Endplate osteophytes and circumferential protrusion of disc material, most prominent in the right extraforaminal region.  Mild stenosis of the lateral recesses, left more than right, without definite neural compression in that location.  There be potential to focally  irritate the right L4 nerve root.  L5-S1:  Mild bulging of the disc.  Mild facet degeneration.  No stenosis.  Since the previous exam, there has been progressive degeneration of the disc at the L4-5 level.  There would be slightly more potential for nerve root compression in the right foraminal to extraforaminal region and in the left lateral recess.  IMPRESSION: Continued good appearance at the fusion level of L3-4.  No change in a mild adjacent segment disc bulge at L2-3.  Further disc degeneration at L4-5.  Facet arthropathy with 2 mm of anterolisthesis.  Slight worsening of the left lateral recess stenosis with potential for neural compression in this location. Right foraminal to extra foraminal osteophyte and disc material could effect the right L4 nerve root.   Original Report Authenticated By: Nelson Chimes, M.D.     RECENT VITAL SIGNS:  Patient Vitals for the past 24 hrs:  BP Temp Temp src Pulse Resp SpO2  07/06/12 0613 110/51 mmHg 98.1 F (36.7 C) Oral 68 19 97 %  07/05/12 2155 104/44 mmHg 98.6 F (37 C) Oral 74 20 96 %  07/05/12 1828 - - - - - 95 %  07/05/12 1425 121/58 mmHg 98.3 F (36.8 C) - 66 - 99 %  07/05/12 1347 - 97.6 F (36.4 C) - - - -  07/05/12 1340 - - - 66 23 98 %  07/05/12 1338 125/51 mmHg - - 63 24 98 %  07/05/12 1330 - - - 59 18 98 %  07/05/12 1323 136/49 mmHg - - 59 18 98 %  07/05/12 1315 - - - 98 25 98 %  07/05/12 1307 137/54 mmHg - - 67 18 97 %  07/05/12 1300 - - - 68 20 97 %  07/05/12 1255 136/51 mmHg - - 75 26 96 %  07/05/12 1245 - - - 78 21 96 %  07/05/12 1238 115/40 mmHg - - 81 19 95 %  07/05/12 1236 - 98 F (36.7 C) - 76 - 97 %  07/05/12 0923 - - - 69 19 98 %  07/05/12 0900 - - - 60 17 98 %  07/05/12 0817 149/81 mmHg 98.1 F (36.7 C) - 59 18 96 %  .  RECENT EKG RESULTS:    Orders placed during the hospital encounter of 06/28/12  . EKG 12-LEAD  . EKG 12-LEAD    DISCHARGE INSTRUCTIONS:    DISCHARGE MEDICATIONS:     Medication List    TAKE  these medications       aspirin EC 81 MG tablet  Take 81 mg by mouth daily.     BIOTIN PO  Take 1 tablet by mouth daily.     escitalopram 20 MG tablet  Commonly known as:  LEXAPRO  Take 20 mg by mouth at bedtime.     HYDROcodone-acetaminophen 10-325 MG per tablet  Commonly  known as:  NORCO  Take 0.5-1 tablets by mouth every 4 (four) hours as needed for pain.     omeprazole 20 MG capsule  Commonly known as:  PRILOSEC  Take 20 mg by mouth daily.     OVER THE COUNTER MEDICATION  Take 1 tablet by mouth 2 (two) times daily. preservision vitamin for eye care     travoprost (benzalkonium) 0.004 % ophthalmic solution  Commonly known as:  TRAVATAN  Place 1 drop into both eyes at bedtime.     zolpidem 5 MG tablet  Commonly known as:  AMBIEN  Take 1 tablet (5 mg total) by mouth at bedtime as needed for sleep.     zolpidem 5 MG tablet  Commonly known as:  AMBIEN  Take 1 tablet (5 mg total) by mouth at bedtime as needed for sleep.        FOLLOW UP VISIT:       Follow-up Information   Follow up with SUPPLE,KEVIN M, MD. (call to be seen in 10-14 days)    Contact information:   [address], [address] [place], [place] [address] [place] [address] [phone]       DISCHARGE TO: home  DISPOSITION: good  DISCHARGE CONDITION:  Good   SHUFORD,TRACY for Dr. Lennette Bihari Supple 07/06/2012, 8:16 AM

## 2012-07-06 NOTE — Progress Notes (Signed)
D/C instructions and scripts given. Pts daughter at bedside to take pt home.

## 2012-07-06 NOTE — Op Note (Signed)
[name], [name]              ACCOUNT NO.:  [id number]  MEDICAL RECORD NO.:  [id number]  LOCATION:  5N01C                        FACILITY:  [facility]  PHYSICIAN:  Metta Clines. Supple, M.D.  DATE OF BIRTH:  Dec 09, 1925  DATE OF PROCEDURE:  07/05/2012 DATE OF DISCHARGE:                              OPERATIVE REPORT   PREOPERATIVE DIAGNOSIS:  End-stage right shoulder osteoarthritis.  POSTOPERATIVE DIAGNOSIS:  End-stage right shoulder osteoarthritis.  PROCEDURE:  Right total shoulder arthroplasty utilizing a press-fit size 10 DePuy global stem, 44 x 18 eccentric humeral head, and a cemented pegged 40 glenoid.  SURGEON:  Metta Clines. Supple, M.D.  Terrence DupontOlivia Mackie A. Shuford, PA-C.  ANESTHESIA:  General endotracheal as well as an interscalene block.  ESTIMATED BLOOD LOSS:  200 mL.  DRAINS:  None.  HISTORY:  [name] is an 79 year old female with chronic and progressive increasing right shoulder pain.  She has had a previous left shoulder arthroplasty for end-stage arthrosis and done very well from a clinical standpoint.  She is brought now to the operating room for planned right total shoulder arthroplasty.  Preoperatively counseled [name] on treatment options as well as risks versus benefits thereof.  Possible surgical complications were reviewed including the potential for bleeding, infection, neurovascular injury, persistent pain, loss of motion, anesthetic complication, failure of the implant and possible need for additional surgery.  She understands and accepts and agrees with our planned procedure.  PROCEDURE IN DETAIL:  After undergoing routine preop evaluation, the patient received prophylactic antibiotics and an interscalene block was established in the holding area by the Anesthesia Department.  Placed supine on the operating table, underwent smooth induction of general endotracheal anesthesia.  Placed in beach-chair position and appropriately padded and protected.   The right shoulder girdle region was then sterilely prepped and draped in standard fashion.  Time-out was called.  An anterior deltopectoral approach to the right shoulder was made through a approximate 15 cm incision.  Skin flaps were elevated and electrocautery was used for hemostasis.  The cephalic vein and deltopectoral interval were identified.  An interval was developed bluntly with the cephalic vein taken laterally.  The upper centimeter of the pec major was tenotomized to enhance visualization.  Adhesions were divided in the subacromial/subdeltoid bursal region and the conjoined tendon was identified, mobilized, and retracted medially.  Self- retaining retractors were placed.  Biceps tendon was then unroofed tenotomized for later tenodesis.  The rotator interval was then split to the base of the coracoid and then the subscapular was divided from the lesser tuberosity leaving a lateral 1.5 cm cuff of tissue for later repair.  The free margin tagged with #2 FiberWires.  We confirmed the subscap had good mobility for which she indeed did.  We then divided the capsule from the anterior and inferior aspects of the humeral neck and deliver the humeral head through the wound showing marked eburnation of bone with complete loss of cartilage.  Extramedullary guide was then used to outline our proposed humeral head resection, which was completed with an oscillating saw.  We then used hand reamers to open the medullary canal with size 10, and then a size 10 broach was used  maintaining 30-degrees retroversion and then we broached up to size 10 with good fit.  We used a rongeur to remove osteophytes on the anterior and inferior aspects of the humeral neck.  At this point, we then placed a metal cap over the cut proximal humeral surface and then exposed the glenoid with combination of Fukuda, pitchfork, and snake tongue retractors.  Performed a circumferential labral resection,  gaining visualization the entirety of the glenoid.  A guidepin was placed to the center of the glenoid and then reamed with the 40 mm reamer to a smooth subchondral bone base.  We then placed a central drill hole in the femoral PEG holes and the trial glenoid showed excellent fit.  The wound was then irrigated, dried, and cement was mixed and the cement was then introduced into the peripheral peg holes and then final 40 glenoid was introduced and impacted with good fit and fixation.  Once the cement had hardened, we then returned our attention to the proximal humerus where we impacted the size 10 stem to the appropriate depth using bone graft from the resected humeral head.  The overall fit and fixation was much to our satisfaction.  We then performed a series of trial reductions and ultimately the 48 x 18 eccentric had the best soft tissue balance with coverage of the proximal humerus.  The final implant was impacted after the [facility] taper was meticulously cleaned and dried.  We then repaired the subscapularis back to the lesser tuberosity with #2 FiberWire.  The interval was also closed with repair of figure-of-eight and #2 FiberWire sutures.  The biceps tendon was then tenodesed at suprapectoral level with #2 FiberWire.  The wound was then irrigated.  Hemostasis was obtained.  The deltopectoral interval was then reapproximated with a figure-of-eight 0 Vicryl sutures, 2-0 Vicryl used for the subcu layer and intracuticular, 3-0 Monocryl for the skin followed by Steri-Strips. Dry dressing applied.  Jenetta Loges, PA-C was used as an Environmental consultant throughout this case essential for help with positioning extremity, management of the retractors, tissue manipulation, exposure, wound closure, and intraoperative decision making.  Right arm was placed in a sling.  The patient was awakened, extubated, and taken to recovery room in stable condition.     Metta Clines. Supple, M.D.     KMS/MEDQ  D:   07/05/2012  T:  07/06/2012  Job:  [id number]

## 2012-07-07 ENCOUNTER — Encounter (HOSPITAL_COMMUNITY): Payer: Self-pay | Admitting: Orthopedic Surgery

## 2012-08-06 ENCOUNTER — Other Ambulatory Visit: Payer: Self-pay | Admitting: Family Medicine

## 2013-01-02 ENCOUNTER — Encounter: Payer: Self-pay | Admitting: Family

## 2013-01-02 ENCOUNTER — Ambulatory Visit (INDEPENDENT_AMBULATORY_CARE_PROVIDER_SITE_OTHER): Payer: Medicare Other | Admitting: Family

## 2013-01-02 VITALS — BP 130/66 | HR 66 | Temp 97.7°F | Wt 131.0 lb

## 2013-01-02 DIAGNOSIS — G4700 Insomnia, unspecified: Secondary | ICD-10-CM

## 2013-01-02 DIAGNOSIS — N390 Urinary tract infection, site not specified: Secondary | ICD-10-CM

## 2013-01-02 DIAGNOSIS — F329 Major depressive disorder, single episode, unspecified: Secondary | ICD-10-CM

## 2013-01-02 LAB — POCT URINALYSIS DIPSTICK
Bilirubin, UA: NEGATIVE
Glucose, UA: NEGATIVE
Ketones, UA: NEGATIVE
Spec Grav, UA: 1.02

## 2013-01-02 MED ORDER — ZOLPIDEM TARTRATE 10 MG PO TABS: 5.0000 mg | ORAL_TABLET | Freq: Every evening | ORAL | Status: DC | PRN

## 2013-01-02 MED ORDER — ESCITALOPRAM OXALATE 20 MG PO TABS: 20.0000 mg | ORAL_TABLET | Freq: Every day | ORAL | Status: DC

## 2013-01-02 MED ORDER — CIPROFLOXACIN HCL 250 MG PO TABS: 250.0000 mg | ORAL_TABLET | Freq: Two times a day (BID) | ORAL | Status: DC

## 2013-01-02 NOTE — Patient Instructions (Signed)
Urinary Tract Infection  Urinary tract infections (UTIs) can develop anywhere along your urinary tract. Your urinary tract is your body's drainage system for removing wastes and extra water. Your urinary tract includes two kidneys, two ureters, a bladder, and a urethra. Your kidneys are a pair of bean-shaped organs. Each kidney is about the size of your fist. They are located below your ribs, one on each side of your spine.  CAUSES  Infections are caused by microbes, which are microscopic organisms, including fungi, viruses, and bacteria. These organisms are so small that they can only be seen through a microscope. Bacteria are the microbes that most commonly cause UTIs.  SYMPTOMS   Symptoms of UTIs may vary by age and gender of the patient and by the location of the infection. Symptoms in young women typically include a frequent and intense urge to urinate and a painful, burning feeling in the bladder or urethra during urination. Older women and men are more likely to be tired, shaky, and weak and have muscle aches and abdominal pain. A fever may mean the infection is in your kidneys. Other symptoms of a kidney infection include pain in your back or sides below the ribs, nausea, and vomiting.  DIAGNOSIS  To diagnose a UTI, your caregiver will ask you about your symptoms. Your caregiver also will ask to provide a urine sample. The urine sample will be tested for bacteria and white blood cells. White blood cells are made by your body to help fight infection.  TREATMENT   Typically, UTIs can be treated with medication. Because most UTIs are caused by a bacterial infection, they usually can be treated with the use of antibiotics. The choice of antibiotic and length of treatment depend on your symptoms and the type of bacteria causing your infection.  HOME CARE INSTRUCTIONS   If you were prescribed antibiotics, take them exactly as your caregiver instructs you. Finish the medication even if you feel better after you  have only taken some of the medication.   Drink enough water and fluids to keep your urine clear or pale yellow.   Avoid caffeine, tea, and carbonated beverages. They tend to irritate your bladder.   Empty your bladder often. Avoid holding urine for long periods of time.   Empty your bladder before and after sexual intercourse.   After a bowel movement, women should cleanse from front to back. Use each tissue only once.  SEEK MEDICAL CARE IF:    You have back pain.   You develop a fever.   Your symptoms do not begin to resolve within 3 days.  SEEK IMMEDIATE MEDICAL CARE IF:    You have severe back pain or lower abdominal pain.   You develop chills.   You have nausea or vomiting.   You have continued burning or discomfort with urination.  MAKE SURE YOU:    Understand these instructions.   Will watch your condition.   Will get help right away if you are not doing well or get worse.  Document Released: 01/19/2005 Document Revised: 10/11/2011 Document Reviewed: 05/20/2011  [employer] Patient Information 2014 [employer].

## 2013-01-02 NOTE — Progress Notes (Signed)
Subjective:    Patient ID: [name], female    DOB: Mar 05, 1926, 79 y.o.   MRN: [id number]  HPI 79 year old female nonsmoker, patient of Dr. Sherren Mocha, is in today with complaints of urinary frequency, urgency and burning, x 1 week. She has a history of UTIs in the past. Also reports going off of her Lexapro 20 mg and feels like she needs to be back on the medication. Has feelings of helplessness and hopelessness. Denies any thoughts of death or dying grandson to commit suicide. She is also having difficulty sleeping and would like maybe a prescription renewed.   Review of Systems  Constitutional: Negative.   Respiratory: Negative.   Cardiovascular: Negative.   Gastrointestinal: Negative.   Genitourinary: Positive for urgency and frequency.  Musculoskeletal: Negative.   Neurological: Negative.   Hematological: Negative.   Psychiatric/Behavioral: Positive for sleep disturbance. The patient is nervous/anxious.    Past Medical History  Diagnosis Date  . Lack of bladder control   . Renal cyst     right  . Macular degeneration   . Glaucoma   . Arthritis   . Insomnia   . Small bowel obstruction   . Gastritis   . Degenerative joint disease   . Constipation   . Trigeminal neuralgia   . OA (osteoarthritis)   . GERD (gastroesophageal reflux disease)   . Depression   . COPD, moderate   . Allergy   . Adenomatous colon polyp 04/26/95    tubulovillous  . Shortness of breath     History   Social History  . Marital Status: Widowed    Spouse Name: N/A    Number of Children: N/A  . Years of Education: N/A   Occupational History  . Not on file.   Social History Main Topics  . Smoking status: Former Smoker -- 1.00 packs/day for 50 years    Types: Cigarettes    Quit date: 06/28/2005  . Smokeless tobacco: Not on file  . Alcohol Use: No  . Drug Use: No  . Sexual Activity: No   Other Topics Concern  . Not on file   Social History Narrative   Childbirth x 2   Retired Therapist, sports         Past Surgical History  Procedure Laterality Date  . Tonsillectomy    . Dilation and curettage of uterus    . Abdominal hysterectomy      nonmalignant reasons  . Joint replacement      right hip  . Cholecystectomy    . Partial colectomy  04/26/95    tubulovillous adenoma  . Back surgery  05/20/08  . Cataract extraction      left  . Shoulder surgery  12/24/08    left  . Total shoulder arthroplasty Right 07/05/2012    Procedure: RIGHT TOTAL SHOULDER ARTHROPLASTY;  Surgeon: Marin Shutter, MD;  Location: [facility];  Service: Orthopedics;  Laterality: Right;  Right total shoulder arthroplasty    Family History  Problem Relation Age of Onset  . Cancer Father     colon  . Heart disease Sister   . Colon polyps Brother   . Diabetes Brother     Allergies  Allergen Reactions  . Codeine Itching and Nausea Only    Small amounts okay  . Pregabalin Other (See Comments)    Confusion and hallucination  . Promethazine Hcl Other (See Comments)    Muscle cramps  . Sulfonamide Derivatives Nausea Only    Current Outpatient Prescriptions on  File Prior to Visit  Medication Sig Dispense Refill  . aspirin EC 81 MG tablet Take 81 mg by mouth daily.      Marland Kitchen BIOTIN PO Take 1 tablet by mouth daily.      Marland Kitchen HYDROcodone-acetaminophen (NORCO) 10-325 MG per tablet Take 0.5-1 tablets by mouth every 4 (four) hours as needed for pain.  60 tablet  1  . omeprazole (PRILOSEC) 20 MG capsule Take 20 mg by mouth daily.      Marland Kitchen OVER THE COUNTER MEDICATION Take 1 tablet by mouth 2 (two) times daily. preservision vitamin for eye care      . travoprost, benzalkonium, (TRAVATAN) 0.004 % ophthalmic solution Place 1 drop into both eyes at bedtime.        No current facility-administered medications on file prior to visit.    BP 130/66  Pulse 66  Temp(Src) 97.7 F (36.5 C) (Oral)  Wt 131 lb (59.421 kg)  BMI 23.95 kg/m2chart     Objective:   Physical Exam  Constitutional: She is oriented to person, place, and  time. She appears well-developed and well-nourished.  Neck: Normal range of motion. Neck supple.  Cardiovascular: Normal rate, regular rhythm and normal heart sounds.   Pulmonary/Chest: Effort normal and breath sounds normal.  Musculoskeletal: Normal range of motion.  Neurological: She is alert and oriented to person, place, and time.  Skin: Skin is warm and dry.  Psychiatric: She has a normal mood and affect.          Assessment & Plan:  Assessment: 1. urinary tract infection 2. Dysuria 3. Insomnia 4. Depression  Plan: Cipro 250 mg one tablet twice a day x5 days. Increased intake of water. Avoid caffeine. Resume Lexapro at 10 mg x1 week then increase to 20 mg daily. Ambien 10 mg one half tablet as needed, not to be used nightly. Patient request dispense as written prescriptions therefore brand-name only was written. Recheck with Dr. Sherren Mocha in 4 weeks and sooner as needed.

## 2013-02-08 ENCOUNTER — Ambulatory Visit (INDEPENDENT_AMBULATORY_CARE_PROVIDER_SITE_OTHER): Payer: Medicare Other

## 2013-02-08 DIAGNOSIS — Z23 Encounter for immunization: Secondary | ICD-10-CM

## 2013-04-15 ENCOUNTER — Encounter: Payer: Self-pay | Admitting: Family

## 2013-04-15 ENCOUNTER — Ambulatory Visit (INDEPENDENT_AMBULATORY_CARE_PROVIDER_SITE_OTHER): Payer: Medicare Other | Admitting: Family

## 2013-04-15 VITALS — BP 118/62 | HR 70 | Wt 130.0 lb

## 2013-04-15 DIAGNOSIS — R300 Dysuria: Secondary | ICD-10-CM

## 2013-04-15 DIAGNOSIS — N390 Urinary tract infection, site not specified: Secondary | ICD-10-CM

## 2013-04-15 LAB — POCT URINALYSIS DIPSTICK
Bilirubin, UA: NEGATIVE
Glucose, UA: NEGATIVE
Nitrite, UA: POSITIVE

## 2013-04-15 MED ORDER — CIPROFLOXACIN HCL 250 MG PO TABS: 250.0000 mg | ORAL_TABLET | Freq: Two times a day (BID) | ORAL | Status: DC

## 2013-04-15 NOTE — Progress Notes (Signed)
Subjective:    Patient ID: [name], female    DOB: Apr 22, 1926, 79 y.o.   MRN: [id number]  HPI 79 year old white female, patient of Dr. Sherren Mocha is in today with complaints of urinary frequency, urgency, burning with urination x2 days and worsening. She has not taken any medication over-the-counter. She has a history of UTIs in the past. Reports occasionally when came in on its due to bladder leakage.  Review of Systems  Constitutional: Negative.   Respiratory: Negative.   Cardiovascular: Negative.   Gastrointestinal: Negative.   Endocrine: Negative.   Genitourinary: Positive for dysuria and urgency.  Musculoskeletal: Negative.   Skin: Negative.   Neurological: Negative.   Psychiatric/Behavioral: Negative.    Past Medical History  Diagnosis Date  . Lack of bladder control   . Renal cyst     right  . Macular degeneration   . Glaucoma   . Arthritis   . Insomnia   . Small bowel obstruction   . Gastritis   . Degenerative joint disease   . Constipation   . Trigeminal neuralgia   . OA (osteoarthritis)   . GERD (gastroesophageal reflux disease)   . Depression   . COPD, moderate   . Allergy   . Adenomatous colon polyp 04/26/95    tubulovillous  . Shortness of breath     History   Social History  . Marital Status: Widowed    Spouse Name: N/A    Number of Children: N/A  . Years of Education: N/A   Occupational History  . Not on file.   Social History Main Topics  . Smoking status: Former Smoker -- 1.00 packs/day for 50 years    Types: Cigarettes    Quit date: 06/28/2005  . Smokeless tobacco: Not on file  . Alcohol Use: No  . Drug Use: No  . Sexual Activity: No   Other Topics Concern  . Not on file   Social History Narrative   Childbirth x 2   Retired Therapist, sports          Past Surgical History  Procedure Laterality Date  . Tonsillectomy    . Dilation and curettage of uterus    . Abdominal hysterectomy      nonmalignant reasons  . Joint replacement     right hip  . Cholecystectomy    . Partial colectomy  04/26/95    tubulovillous adenoma  . Back surgery  05/20/08  . Cataract extraction      left  . Shoulder surgery  12/24/08    left  . Total shoulder arthroplasty Right 07/05/2012    Procedure: RIGHT TOTAL SHOULDER ARTHROPLASTY;  Surgeon: Marin Shutter, MD;  Location: [facility];  Service: Orthopedics;  Laterality: Right;  Right total shoulder arthroplasty    Family History  Problem Relation Age of Onset  . Cancer Father     colon  . Heart disease Sister   . Colon polyps Brother   . Diabetes Brother     Allergies  Allergen Reactions  . Codeine Itching and Nausea Only    Small amounts okay  . Pregabalin Other (See Comments)    Confusion and hallucination  . Promethazine Hcl Other (See Comments)    Muscle cramps  . Sulfonamide Derivatives Nausea Only    Current Outpatient Prescriptions on File Prior to Visit  Medication Sig Dispense Refill  . aspirin EC 81 MG tablet Take 81 mg by mouth daily.      Marland Kitchen BIOTIN PO Take  1 tablet by mouth daily.      Marland Kitchen escitalopram (LEXAPRO) 20 MG tablet Take 1 tablet (20 mg total) by mouth daily. DAW  30 tablet  1  . HYDROcodone-acetaminophen (NORCO) 10-325 MG per tablet Take 0.5-1 tablets by mouth every 4 (four) hours as needed for pain.  60 tablet  1  . omeprazole (PRILOSEC) 20 MG capsule Take 20 mg by mouth daily.      Marland Kitchen OVER THE COUNTER MEDICATION Take 1 tablet by mouth 2 (two) times daily. preservision vitamin for eye care      . travoprost, benzalkonium, (TRAVATAN) 0.004 % ophthalmic solution Place 1 drop into both eyes at bedtime.        No current facility-administered medications on file prior to visit.    BP 118/62  Pulse 70  Wt 130 lb (58.968 kg)chart     Objective:   Physical Exam  Constitutional: She is oriented to person, place, and time. She appears well-developed and well-nourished.  Neck: Normal range of motion. Neck supple.  Cardiovascular: Normal rate, regular rhythm and  normal heart sounds.   Pulmonary/Chest: Effort normal and breath sounds normal.  Abdominal: Soft. Bowel sounds are normal. There is no tenderness. There is no rebound.  Musculoskeletal: Normal range of motion.  Neurological: She is alert and oriented to person, place, and time.  Skin: Skin is warm and dry.  Psychiatric: She has a normal mood and affect.          Assessment & Plan:  Assessment: 1. Urinary tract infection 2. Dysuria  Plan: Cipro 250 mg one tablet twice a day x7 days. Drink plenty of water. Avoid caffeine. Patient to call the office if symptoms worsen or persist. Recheck as scheduled, and as needed.

## 2013-04-15 NOTE — Patient Instructions (Signed)
Urinary Tract Infection  Urinary tract infections (UTIs) can develop anywhere along your urinary tract. Your urinary tract is your body's drainage system for removing wastes and extra water. Your urinary tract includes two kidneys, two ureters, a bladder, and a urethra. Your kidneys are a pair of bean-shaped organs. Each kidney is about the size of your fist. They are located below your ribs, one on each side of your spine.  CAUSES  Infections are caused by microbes, which are microscopic organisms, including fungi, viruses, and bacteria. These organisms are so small that they can only be seen through a microscope. Bacteria are the microbes that most commonly cause UTIs.  SYMPTOMS   Symptoms of UTIs may vary by age and gender of the patient and by the location of the infection. Symptoms in young women typically include a frequent and intense urge to urinate and a painful, burning feeling in the bladder or urethra during urination. Older women and men are more likely to be tired, shaky, and weak and have muscle aches and abdominal pain. A fever may mean the infection is in your kidneys. Other symptoms of a kidney infection include pain in your back or sides below the ribs, nausea, and vomiting.  DIAGNOSIS  To diagnose a UTI, your caregiver will ask you about your symptoms. Your caregiver also will ask to provide a urine sample. The urine sample will be tested for bacteria and white blood cells. White blood cells are made by your body to help fight infection.  TREATMENT   Typically, UTIs can be treated with medication. Because most UTIs are caused by a bacterial infection, they usually can be treated with the use of antibiotics. The choice of antibiotic and length of treatment depend on your symptoms and the type of bacteria causing your infection.  HOME CARE INSTRUCTIONS   If you were prescribed antibiotics, take them exactly as your caregiver instructs you. Finish the medication even if you feel better after you  have only taken some of the medication.   Drink enough water and fluids to keep your urine clear or pale yellow.   Avoid caffeine, tea, and carbonated beverages. They tend to irritate your bladder.   Empty your bladder often. Avoid holding urine for long periods of time.   Empty your bladder before and after sexual intercourse.   After a bowel movement, women should cleanse from front to back. Use each tissue only once.  SEEK MEDICAL CARE IF:    You have back pain.   You develop a fever.   Your symptoms do not begin to resolve within 3 days.  SEEK IMMEDIATE MEDICAL CARE IF:    You have severe back pain or lower abdominal pain.   You develop chills.   You have nausea or vomiting.   You have continued burning or discomfort with urination.  MAKE SURE YOU:    Understand these instructions.   Will watch your condition.   Will get help right away if you are not doing well or get worse.  Document Released: 01/19/2005 Document Revised: 10/11/2011 Document Reviewed: 05/20/2011  [employer] Patient Information 2014 [employer].

## 2013-04-29 DIAGNOSIS — M545 Low back pain, unspecified: Secondary | ICD-10-CM | POA: Diagnosis not present

## 2013-05-02 DIAGNOSIS — H353290 Exudative age-related macular degeneration, unspecified eye, stage unspecified: Secondary | ICD-10-CM | POA: Diagnosis not present

## 2013-05-02 DIAGNOSIS — H35059 Retinal neovascularization, unspecified, unspecified eye: Secondary | ICD-10-CM | POA: Diagnosis not present

## 2013-05-02 DIAGNOSIS — H35359 Cystoid macular degeneration, unspecified eye: Secondary | ICD-10-CM | POA: Diagnosis not present

## 2013-05-06 DIAGNOSIS — M545 Low back pain, unspecified: Secondary | ICD-10-CM | POA: Diagnosis not present

## 2013-05-13 DIAGNOSIS — M545 Low back pain, unspecified: Secondary | ICD-10-CM | POA: Diagnosis not present

## 2013-06-10 DIAGNOSIS — Z79899 Other long term (current) drug therapy: Secondary | ICD-10-CM | POA: Diagnosis not present

## 2013-06-10 DIAGNOSIS — M533 Sacrococcygeal disorders, not elsewhere classified: Secondary | ICD-10-CM | POA: Diagnosis not present

## 2013-06-10 DIAGNOSIS — G894 Chronic pain syndrome: Secondary | ICD-10-CM | POA: Diagnosis not present

## 2013-06-10 DIAGNOSIS — M47817 Spondylosis without myelopathy or radiculopathy, lumbosacral region: Secondary | ICD-10-CM | POA: Diagnosis not present

## 2013-06-10 DIAGNOSIS — M5137 Other intervertebral disc degeneration, lumbosacral region: Secondary | ICD-10-CM | POA: Diagnosis not present

## 2013-06-27 DIAGNOSIS — H35059 Retinal neovascularization, unspecified, unspecified eye: Secondary | ICD-10-CM | POA: Diagnosis not present

## 2013-06-27 DIAGNOSIS — H353290 Exudative age-related macular degeneration, unspecified eye, stage unspecified: Secondary | ICD-10-CM | POA: Diagnosis not present

## 2013-08-05 DIAGNOSIS — M5137 Other intervertebral disc degeneration, lumbosacral region: Secondary | ICD-10-CM | POA: Diagnosis not present

## 2013-08-05 DIAGNOSIS — M47817 Spondylosis without myelopathy or radiculopathy, lumbosacral region: Secondary | ICD-10-CM | POA: Diagnosis not present

## 2013-08-05 DIAGNOSIS — G894 Chronic pain syndrome: Secondary | ICD-10-CM | POA: Diagnosis not present

## 2013-08-12 DIAGNOSIS — M47817 Spondylosis without myelopathy or radiculopathy, lumbosacral region: Secondary | ICD-10-CM | POA: Diagnosis not present

## 2013-08-20 DIAGNOSIS — H409 Unspecified glaucoma: Secondary | ICD-10-CM | POA: Diagnosis not present

## 2013-08-20 DIAGNOSIS — H4010X0 Unspecified open-angle glaucoma, stage unspecified: Secondary | ICD-10-CM | POA: Diagnosis not present

## 2013-08-22 DIAGNOSIS — H35059 Retinal neovascularization, unspecified, unspecified eye: Secondary | ICD-10-CM | POA: Diagnosis not present

## 2013-08-22 DIAGNOSIS — H353290 Exudative age-related macular degeneration, unspecified eye, stage unspecified: Secondary | ICD-10-CM | POA: Diagnosis not present

## 2013-08-26 DIAGNOSIS — M47817 Spondylosis without myelopathy or radiculopathy, lumbosacral region: Secondary | ICD-10-CM | POA: Diagnosis not present

## 2013-08-26 DIAGNOSIS — M5137 Other intervertebral disc degeneration, lumbosacral region: Secondary | ICD-10-CM | POA: Diagnosis not present

## 2013-08-26 DIAGNOSIS — IMO0002 Reserved for concepts with insufficient information to code with codable children: Secondary | ICD-10-CM | POA: Diagnosis not present

## 2013-08-26 DIAGNOSIS — G894 Chronic pain syndrome: Secondary | ICD-10-CM | POA: Diagnosis not present

## 2013-09-02 ENCOUNTER — Telehealth: Payer: Self-pay | Admitting: Family Medicine

## 2013-09-02 NOTE — Telephone Encounter (Signed)
[facility], [address] Is requesting re-fill on        AMBIEN 10 MG tablet

## 2013-09-03 NOTE — Telephone Encounter (Signed)
Rx denied

## 2013-10-14 DIAGNOSIS — G894 Chronic pain syndrome: Secondary | ICD-10-CM | POA: Diagnosis not present

## 2013-10-14 DIAGNOSIS — IMO0002 Reserved for concepts with insufficient information to code with codable children: Secondary | ICD-10-CM | POA: Diagnosis not present

## 2013-10-14 DIAGNOSIS — M76899 Other specified enthesopathies of unspecified lower limb, excluding foot: Secondary | ICD-10-CM | POA: Diagnosis not present

## 2013-10-14 DIAGNOSIS — M5137 Other intervertebral disc degeneration, lumbosacral region: Secondary | ICD-10-CM | POA: Diagnosis not present

## 2013-10-14 DIAGNOSIS — M47817 Spondylosis without myelopathy or radiculopathy, lumbosacral region: Secondary | ICD-10-CM | POA: Diagnosis not present

## 2013-11-11 DIAGNOSIS — M533 Sacrococcygeal disorders, not elsewhere classified: Secondary | ICD-10-CM | POA: Diagnosis not present

## 2013-11-11 DIAGNOSIS — M25559 Pain in unspecified hip: Secondary | ICD-10-CM | POA: Diagnosis not present

## 2013-11-11 DIAGNOSIS — M47817 Spondylosis without myelopathy or radiculopathy, lumbosacral region: Secondary | ICD-10-CM | POA: Diagnosis not present

## 2013-11-11 DIAGNOSIS — M169 Osteoarthritis of hip, unspecified: Secondary | ICD-10-CM | POA: Diagnosis not present

## 2013-11-11 DIAGNOSIS — G894 Chronic pain syndrome: Secondary | ICD-10-CM | POA: Diagnosis not present

## 2013-11-11 DIAGNOSIS — M1610 Unilateral primary osteoarthritis, unspecified hip: Secondary | ICD-10-CM | POA: Diagnosis not present

## 2013-11-11 DIAGNOSIS — M25859 Other specified joint disorders, unspecified hip: Secondary | ICD-10-CM | POA: Diagnosis not present

## 2013-11-11 DIAGNOSIS — M5137 Other intervertebral disc degeneration, lumbosacral region: Secondary | ICD-10-CM | POA: Diagnosis not present

## 2013-11-21 DIAGNOSIS — H35059 Retinal neovascularization, unspecified, unspecified eye: Secondary | ICD-10-CM | POA: Diagnosis not present

## 2013-11-21 DIAGNOSIS — H353290 Exudative age-related macular degeneration, unspecified eye, stage unspecified: Secondary | ICD-10-CM | POA: Diagnosis not present

## 2013-11-21 DIAGNOSIS — H35359 Cystoid macular degeneration, unspecified eye: Secondary | ICD-10-CM | POA: Diagnosis not present

## 2013-12-25 DIAGNOSIS — L570 Actinic keratosis: Secondary | ICD-10-CM | POA: Diagnosis not present

## 2014-02-11 ENCOUNTER — Ambulatory Visit (INDEPENDENT_AMBULATORY_CARE_PROVIDER_SITE_OTHER): Payer: Medicare Other

## 2014-02-11 DIAGNOSIS — Z23 Encounter for immunization: Secondary | ICD-10-CM | POA: Diagnosis not present

## 2014-02-18 DIAGNOSIS — H3532 Exudative age-related macular degeneration: Secondary | ICD-10-CM | POA: Diagnosis not present

## 2014-02-18 DIAGNOSIS — H35051 Retinal neovascularization, unspecified, right eye: Secondary | ICD-10-CM | POA: Diagnosis not present

## 2014-02-21 DIAGNOSIS — G894 Chronic pain syndrome: Secondary | ICD-10-CM | POA: Diagnosis not present

## 2014-02-21 DIAGNOSIS — Z79891 Long term (current) use of opiate analgesic: Secondary | ICD-10-CM | POA: Diagnosis not present

## 2014-02-21 DIAGNOSIS — M47816 Spondylosis without myelopathy or radiculopathy, lumbar region: Secondary | ICD-10-CM | POA: Diagnosis not present

## 2014-02-21 DIAGNOSIS — M7062 Trochanteric bursitis, left hip: Secondary | ICD-10-CM | POA: Diagnosis not present

## 2014-02-21 DIAGNOSIS — M545 Low back pain: Secondary | ICD-10-CM | POA: Diagnosis not present

## 2014-02-21 DIAGNOSIS — M5136 Other intervertebral disc degeneration, lumbar region: Secondary | ICD-10-CM | POA: Diagnosis not present

## 2014-03-05 DIAGNOSIS — H3531 Nonexudative age-related macular degeneration: Secondary | ICD-10-CM | POA: Diagnosis not present

## 2014-03-05 DIAGNOSIS — H409 Unspecified glaucoma: Secondary | ICD-10-CM | POA: Diagnosis not present

## 2014-05-09 DIAGNOSIS — G4700 Insomnia, unspecified: Secondary | ICD-10-CM | POA: Diagnosis not present

## 2014-05-09 DIAGNOSIS — M47816 Spondylosis without myelopathy or radiculopathy, lumbar region: Secondary | ICD-10-CM | POA: Diagnosis not present

## 2014-05-09 DIAGNOSIS — M461 Sacroiliitis, not elsewhere classified: Secondary | ICD-10-CM | POA: Diagnosis not present

## 2014-05-09 DIAGNOSIS — Z79891 Long term (current) use of opiate analgesic: Secondary | ICD-10-CM | POA: Diagnosis not present

## 2014-05-09 DIAGNOSIS — M5136 Other intervertebral disc degeneration, lumbar region: Secondary | ICD-10-CM | POA: Diagnosis not present

## 2014-05-09 DIAGNOSIS — G894 Chronic pain syndrome: Secondary | ICD-10-CM | POA: Diagnosis not present

## 2014-05-14 DIAGNOSIS — M461 Sacroiliitis, not elsewhere classified: Secondary | ICD-10-CM | POA: Diagnosis not present

## 2014-05-14 DIAGNOSIS — M545 Low back pain: Secondary | ICD-10-CM | POA: Diagnosis not present

## 2014-05-20 DIAGNOSIS — H3532 Exudative age-related macular degeneration: Secondary | ICD-10-CM | POA: Diagnosis not present

## 2014-05-20 DIAGNOSIS — H3551 Vitreoretinal dystrophy: Secondary | ICD-10-CM | POA: Diagnosis not present

## 2014-06-04 DIAGNOSIS — M7062 Trochanteric bursitis, left hip: Secondary | ICD-10-CM | POA: Diagnosis not present

## 2014-06-04 DIAGNOSIS — M5116 Intervertebral disc disorders with radiculopathy, lumbar region: Secondary | ICD-10-CM | POA: Diagnosis not present

## 2014-06-04 DIAGNOSIS — G894 Chronic pain syndrome: Secondary | ICD-10-CM | POA: Diagnosis not present

## 2014-06-04 DIAGNOSIS — M461 Sacroiliitis, not elsewhere classified: Secondary | ICD-10-CM | POA: Diagnosis not present

## 2014-06-30 DIAGNOSIS — H04123 Dry eye syndrome of bilateral lacrimal glands: Secondary | ICD-10-CM | POA: Diagnosis not present

## 2014-06-30 DIAGNOSIS — Z961 Presence of intraocular lens: Secondary | ICD-10-CM | POA: Diagnosis not present

## 2014-06-30 DIAGNOSIS — H3531 Nonexudative age-related macular degeneration: Secondary | ICD-10-CM | POA: Diagnosis not present

## 2014-06-30 DIAGNOSIS — H43811 Vitreous degeneration, right eye: Secondary | ICD-10-CM | POA: Diagnosis not present

## 2014-07-02 DIAGNOSIS — M5116 Intervertebral disc disorders with radiculopathy, lumbar region: Secondary | ICD-10-CM | POA: Diagnosis not present

## 2014-07-02 DIAGNOSIS — M461 Sacroiliitis, not elsewhere classified: Secondary | ICD-10-CM | POA: Diagnosis not present

## 2014-07-02 DIAGNOSIS — M7062 Trochanteric bursitis, left hip: Secondary | ICD-10-CM | POA: Diagnosis not present

## 2014-07-02 DIAGNOSIS — G894 Chronic pain syndrome: Secondary | ICD-10-CM | POA: Diagnosis not present

## 2014-07-07 DIAGNOSIS — M545 Low back pain: Secondary | ICD-10-CM | POA: Diagnosis not present

## 2014-07-15 DIAGNOSIS — M545 Low back pain: Secondary | ICD-10-CM | POA: Diagnosis not present

## 2014-08-20 DIAGNOSIS — M5136 Other intervertebral disc degeneration, lumbar region: Secondary | ICD-10-CM | POA: Diagnosis not present

## 2014-08-20 DIAGNOSIS — I70209 Unspecified atherosclerosis of native arteries of extremities, unspecified extremity: Secondary | ICD-10-CM | POA: Diagnosis not present

## 2014-08-20 DIAGNOSIS — M961 Postlaminectomy syndrome, not elsewhere classified: Secondary | ICD-10-CM | POA: Diagnosis not present

## 2014-08-25 DIAGNOSIS — H3532 Exudative age-related macular degeneration: Secondary | ICD-10-CM | POA: Diagnosis not present

## 2014-09-03 ENCOUNTER — Telehealth: Payer: Self-pay | Admitting: Internal Medicine

## 2014-09-03 NOTE — Telephone Encounter (Signed)
Dr. Sherren Mocha patient requesting to transfer to Dr. Ronnald Ramp.

## 2014-09-07 NOTE — Telephone Encounter (Signed)
yes

## 2014-09-08 NOTE — Telephone Encounter (Signed)
Got scheduled  °

## 2014-09-17 DIAGNOSIS — M545 Low back pain: Secondary | ICD-10-CM | POA: Diagnosis not present

## 2014-09-17 DIAGNOSIS — M961 Postlaminectomy syndrome, not elsewhere classified: Secondary | ICD-10-CM | POA: Diagnosis not present

## 2014-09-17 DIAGNOSIS — G894 Chronic pain syndrome: Secondary | ICD-10-CM | POA: Diagnosis not present

## 2014-09-17 DIAGNOSIS — Z01812 Encounter for preprocedural laboratory examination: Secondary | ICD-10-CM | POA: Diagnosis not present

## 2014-09-24 ENCOUNTER — Other Ambulatory Visit (INDEPENDENT_AMBULATORY_CARE_PROVIDER_SITE_OTHER): Payer: Medicare Other

## 2014-09-24 ENCOUNTER — Encounter: Payer: Self-pay | Admitting: Internal Medicine

## 2014-09-24 ENCOUNTER — Ambulatory Visit (INDEPENDENT_AMBULATORY_CARE_PROVIDER_SITE_OTHER): Payer: Medicare Other | Admitting: Internal Medicine

## 2014-09-24 VITALS — BP 120/64 | HR 60 | Temp 98.6°F | Resp 16 | Ht 62.0 in | Wt 127.0 lb

## 2014-09-24 DIAGNOSIS — K5909 Other constipation: Secondary | ICD-10-CM

## 2014-09-24 DIAGNOSIS — M150 Primary generalized (osteo)arthritis: Secondary | ICD-10-CM | POA: Diagnosis not present

## 2014-09-24 DIAGNOSIS — M159 Polyosteoarthritis, unspecified: Secondary | ICD-10-CM

## 2014-09-24 DIAGNOSIS — Z0000 Encounter for general adult medical examination without abnormal findings: Secondary | ICD-10-CM | POA: Diagnosis not present

## 2014-09-24 DIAGNOSIS — K219 Gastro-esophageal reflux disease without esophagitis: Secondary | ICD-10-CM

## 2014-09-24 DIAGNOSIS — E785 Hyperlipidemia, unspecified: Secondary | ICD-10-CM

## 2014-09-24 DIAGNOSIS — Z23 Encounter for immunization: Secondary | ICD-10-CM

## 2014-09-24 LAB — CBC WITH DIFFERENTIAL/PLATELET
BASOS ABS: 0.1 10*3/uL (ref 0.0–0.1)
BASOS PCT: 1 % (ref 0.0–3.0)
Eosinophils Absolute: 0.2 10*3/uL (ref 0.0–0.7)
Eosinophils Relative: 2.7 % (ref 0.0–5.0)
HCT: 38.4 % (ref 36.0–46.0)
HEMOGLOBIN: 12.5 g/dL (ref 12.0–15.0)
LYMPHS ABS: 2.7 10*3/uL (ref 0.7–4.0)
LYMPHS PCT: 32.7 % (ref 12.0–46.0)
MCHC: 32.7 g/dL (ref 30.0–36.0)
MCV: 90.2 fl (ref 78.0–100.0)
MONO ABS: 0.5 10*3/uL (ref 0.1–1.0)
Monocytes Relative: 6.4 % (ref 3.0–12.0)
NEUTROS PCT: 57.2 % (ref 43.0–77.0)
Neutro Abs: 4.7 10*3/uL (ref 1.4–7.7)
PLATELETS: 360 10*3/uL (ref 150.0–400.0)
RBC: 4.25 Mil/uL (ref 3.87–5.11)
RDW: 13.6 % (ref 11.5–15.5)
WBC: 8.3 10*3/uL (ref 4.0–10.5)

## 2014-09-24 LAB — URINALYSIS, ROUTINE W REFLEX MICROSCOPIC
Bilirubin Urine: NEGATIVE
Hgb urine dipstick: NEGATIVE
Ketones, ur: NEGATIVE
Nitrite: POSITIVE — AB
Specific Gravity, Urine: 1.025 (ref 1.000–1.030)
Total Protein, Urine: NEGATIVE
URINE GLUCOSE: NEGATIVE
Urobilinogen, UA: 0.2 (ref 0.0–1.0)
pH: 5.5 (ref 5.0–8.0)

## 2014-09-24 LAB — COMPREHENSIVE METABOLIC PANEL
ALBUMIN: 4.2 g/dL (ref 3.5–5.2)
ALT: 19 U/L (ref 0–35)
AST: 20 U/L (ref 0–37)
Alkaline Phosphatase: 51 U/L (ref 39–117)
BILIRUBIN TOTAL: 0.4 mg/dL (ref 0.2–1.2)
BUN: 14 mg/dL (ref 6–23)
CALCIUM: 9.7 mg/dL (ref 8.4–10.5)
CO2: 27 mEq/L (ref 19–32)
Chloride: 106 mEq/L (ref 96–112)
Creatinine, Ser: 0.99 mg/dL (ref 0.40–1.20)
GFR: 56.43 mL/min — AB (ref >60.00)
GLUCOSE: 91 mg/dL (ref 70–99)
POTASSIUM: 4.3 meq/L (ref 3.5–5.1)
SODIUM: 138 meq/L (ref 135–145)
TOTAL PROTEIN: 7.6 g/dL (ref 6.0–8.3)

## 2014-09-24 LAB — LIPID PANEL
CHOL/HDL RATIO: 4
Cholesterol: 258 mg/dL — ABNORMAL HIGH (ref 0–200)
HDL: 73.3 mg/dL (ref >39.00)
LDL CALC: 150 mg/dL — AB (ref 0–99)
NONHDL: 184.7
Triglycerides: 176 mg/dL — ABNORMAL HIGH (ref 0.0–149.0)
VLDL: 35.2 mg/dL (ref 0.0–40.0)

## 2014-09-24 LAB — TSH: TSH: 3.65 u[IU]/mL (ref 0.35–4.50)

## 2014-09-24 NOTE — Patient Instructions (Signed)
Preventive Care for Adults A healthy lifestyle and preventive care can promote health and wellness. Preventive health guidelines for women include the following key practices.  A routine yearly physical is a good way to check with your health care provider about your health and preventive screening. It is a chance to share any concerns and updates on your health and to receive a thorough exam.  Visit your dentist for a routine exam and preventive care every 6 months. Brush your teeth twice a day and floss once a day. Good oral hygiene prevents tooth decay and gum disease.  The frequency of eye exams is based on your age, health, family medical history, use of contact lenses, and other factors. Follow your health care provider's recommendations for frequency of eye exams.  Eat a healthy diet. Foods like vegetables, fruits, whole grains, low-fat dairy products, and lean protein foods contain the nutrients you need without too many calories. Decrease your intake of foods high in solid fats, added sugars, and salt. Eat the right amount of calories for you.Get information about a proper diet from your health care provider, if necessary.  Regular physical exercise is one of the most important things you can do for your health. Most adults should get at least 150 minutes of moderate-intensity exercise (any activity that increases your heart rate and causes you to sweat) each week. In addition, most adults need muscle-strengthening exercises on 2 or more days a week.  Maintain a healthy weight. The body mass index (BMI) is a screening tool to identify possible weight problems. It provides an estimate of body fat based on height and weight. Your health care provider can find your BMI and can help you achieve or maintain a healthy weight.For adults 20 years and older:  A BMI below 18.5 is considered underweight.  A BMI of 18.5 to 24.9 is normal.  A BMI of 25 to 29.9 is considered overweight.  A BMI of  30 and above is considered obese.  Maintain normal blood lipids and cholesterol levels by exercising and minimizing your intake of saturated fat. Eat a balanced diet with plenty of fruit and vegetables. Blood tests for lipids and cholesterol should begin at age 76 and be repeated every 5 years. If your lipid or cholesterol levels are high, you are over 50, or you are at high risk for heart disease, you may need your cholesterol levels checked more frequently.Ongoing high lipid and cholesterol levels should be treated with medicines if diet and exercise are not working.  If you smoke, find out from your health care provider how to quit. If you do not use tobacco, do not start.  Lung cancer screening is recommended for adults aged 22-80 years who are at high risk for developing lung cancer because of a history of smoking. A yearly low-dose CT scan of the lungs is recommended for people who have at least a 30-pack-year history of smoking and are a current smoker or have quit within the past 15 years. A pack year of smoking is smoking an average of 1 pack of cigarettes a day for 1 year (for example: 1 pack a day for 30 years or 2 packs a day for 15 years). Yearly screening should continue until the smoker has stopped smoking for at least 15 years. Yearly screening should be stopped for people who develop a health problem that would prevent them from having lung cancer treatment.  If you are pregnant, do not drink alcohol. If you are breastfeeding,  be very cautious about drinking alcohol. If you are not pregnant and choose to drink alcohol, do not have more than 1 drink per day. One drink is considered to be 12 ounces (355 mL) of beer, 5 ounces (148 mL) of wine, or 1.5 ounces (44 mL) of liquor.  Avoid use of street drugs. Do not share needles with anyone. Ask for help if you need support or instructions about stopping the use of drugs.  High blood pressure causes heart disease and increases the risk of  stroke. Your blood pressure should be checked at least every 1 to 2 years. Ongoing high blood pressure should be treated with medicines if weight loss and exercise do not work.  If you are 75-52 years old, ask your health care provider if you should take aspirin to prevent strokes.  Diabetes screening involves taking a blood sample to check your fasting blood sugar level. This should be done once every 3 years, after age 15, if you are within normal weight and without risk factors for diabetes. Testing should be considered at a younger age or be carried out more frequently if you are overweight and have at least 1 risk factor for diabetes.  Breast cancer screening is essential preventive care for women. You should practice "breast self-awareness." This means understanding the normal appearance and feel of your breasts and may include breast self-examination. Any changes detected, no matter how small, should be reported to a health care provider. Women in their 58s and 30s should have a clinical breast exam (CBE) by a health care provider as part of a regular health exam every 1 to 3 years. After age 16, women should have a CBE every year. Starting at age 53, women should consider having a mammogram (breast X-ray test) every year. Women who have a family history of breast cancer should talk to their health care provider about genetic screening. Women at a high risk of breast cancer should talk to their health care providers about having an MRI and a mammogram every year.  Breast cancer gene (BRCA)-related cancer risk assessment is recommended for women who have family members with BRCA-related cancers. BRCA-related cancers include breast, ovarian, tubal, and peritoneal cancers. Having family members with these cancers may be associated with an increased risk for harmful changes (mutations) in the breast cancer genes BRCA1 and BRCA2. Results of the assessment will determine the need for genetic counseling and  BRCA1 and BRCA2 testing.  Routine pelvic exams to screen for cancer are no longer recommended for nonpregnant women who are considered low risk for cancer of the pelvic organs (ovaries, uterus, and vagina) and who do not have symptoms. Ask your health care provider if a screening pelvic exam is right for you.  If you have had past treatment for cervical cancer or a condition that could lead to cancer, you need Pap tests and screening for cancer for at least 20 years after your treatment. If Pap tests have been discontinued, your risk factors (such as having a new sexual partner) need to be reassessed to determine if screening should be resumed. Some women have medical problems that increase the chance of getting cervical cancer. In these cases, your health care provider may recommend more frequent screening and Pap tests.  The HPV test is an additional test that may be used for cervical cancer screening. The HPV test looks for the virus that can cause the cell changes on the cervix. The cells collected during the Pap test can be  tested for HPV. The HPV test could be used to screen women aged 58 years and older, and should be used in women of any age who have unclear Pap test results. After the age of 62, women should have HPV testing at the same frequency as a Pap test.  Colorectal cancer can be detected and often prevented. Most routine colorectal cancer screening begins at the age of 26 years and continues through age 47 years. However, your health care provider may recommend screening at an earlier age if you have risk factors for colon cancer. On a yearly basis, your health care provider may provide home test kits to check for hidden blood in the stool. Use of a small camera at the end of a tube, to directly examine the colon (sigmoidoscopy or colonoscopy), can detect the earliest forms of colorectal cancer. Talk to your health care provider about this at age 55, when routine screening begins. Direct  exam of the colon should be repeated every 5-10 years through age 50 years, unless early forms of pre-cancerous polyps or small growths are found.  People who are at an increased risk for hepatitis B should be screened for this virus. You are considered at high risk for hepatitis B if:  You were born in a country where hepatitis B occurs often. Talk with your health care provider about which countries are considered high risk.  Your parents were born in a high-risk country and you have not received a shot to protect against hepatitis B (hepatitis B vaccine).  You have HIV or AIDS.  You use needles to inject street drugs.  You live with, or have sex with, someone who has hepatitis B.  You get hemodialysis treatment.  You take certain medicines for conditions like cancer, organ transplantation, and autoimmune conditions.  Hepatitis C blood testing is recommended for all people born from [birth year] through 1965 and any individual with known risks for hepatitis C.  Practice safe sex. Use condoms and avoid high-risk sexual practices to reduce the spread of sexually transmitted infections (STIs). STIs include gonorrhea, chlamydia, syphilis, trichomonas, herpes, HPV, and human immunodeficiency virus (HIV). Herpes, HIV, and HPV are viral illnesses that have no cure. They can result in disability, cancer, and death.  You should be screened for sexually transmitted illnesses (STIs) including gonorrhea and chlamydia if:  You are sexually active and are younger than 24 years.  You are older than 24 years and your health care provider tells you that you are at risk for this type of infection.  Your sexual activity has changed since you were last screened and you are at an increased risk for chlamydia or gonorrhea. Ask your health care provider if you are at risk.  If you are at risk of being infected with HIV, it is recommended that you take a prescription medicine daily to prevent HIV infection. This is  called preexposure prophylaxis (PrEP). You are considered at risk if:  You are a heterosexual woman, are sexually active, and are at increased risk for HIV infection.  You take drugs by injection.  You are sexually active with a partner who has HIV.  Talk with your health care provider about whether you are at high risk of being infected with HIV. If you choose to begin PrEP, you should first be tested for HIV. You should then be tested every 3 months for as long as you are taking PrEP.  Osteoporosis is a disease in which the bones lose minerals and strength  with aging. This can result in serious bone fractures or breaks. The risk of osteoporosis can be identified using a bone density scan. Women ages 65 years and over and women at risk for fractures or osteoporosis should discuss screening with their health care providers. Ask your health care provider whether you should take a calcium supplement or vitamin D to reduce the rate of osteoporosis.  Menopause can be associated with physical symptoms and risks. Hormone replacement therapy is available to decrease symptoms and risks. You should talk to your health care provider about whether hormone replacement therapy is right for you.  Use sunscreen. Apply sunscreen liberally and repeatedly throughout the day. You should seek shade when your shadow is shorter than you. Protect yourself by wearing long sleeves, pants, a wide-brimmed hat, and sunglasses year round, whenever you are outdoors.  Once a month, do a whole body skin exam, using a mirror to look at the skin on your back. Tell your health care provider of new moles, moles that have irregular borders, moles that are larger than a pencil eraser, or moles that have changed in shape or color.  Stay current with required vaccines (immunizations).  Influenza vaccine. All adults should be immunized every year.  Tetanus, diphtheria, and acellular pertussis (Td, Tdap) vaccine. Pregnant women should  receive 1 dose of Tdap vaccine during each pregnancy. The dose should be obtained regardless of the length of time since the last dose. Immunization is preferred during the 27th-36th week of gestation. An adult who has not previously received Tdap or who does not know her vaccine status should receive 1 dose of Tdap. This initial dose should be followed by tetanus and diphtheria toxoids (Td) booster doses every 10 years. Adults with an unknown or incomplete history of completing a 3-dose immunization series with Td-containing vaccines should begin or complete a primary immunization series including a Tdap dose. Adults should receive a Td booster every 10 years.  Varicella vaccine. An adult without evidence of immunity to varicella should receive 2 doses or a second dose if she has previously received 1 dose. Pregnant females who do not have evidence of immunity should receive the first dose after pregnancy. This first dose should be obtained before leaving the health care facility. The second dose should be obtained 4-8 weeks after the first dose.  Human papillomavirus (HPV) vaccine. Females aged 13-26 years who have not received the vaccine previously should obtain the 3-dose series. The vaccine is not recommended for use in pregnant females. However, pregnancy testing is not needed before receiving a dose. If a female is found to be pregnant after receiving a dose, no treatment is needed. In that case, the remaining doses should be delayed until after the pregnancy. Immunization is recommended for any person with an immunocompromised condition through the age of 26 years if she did not get any or all doses earlier. During the 3-dose series, the second dose should be obtained 4-8 weeks after the first dose. The third dose should be obtained 24 weeks after the first dose and 16 weeks after the second dose.  Zoster vaccine. One dose is recommended for adults aged 60 years or older unless certain conditions are  present.  Measles, mumps, and rubella (MMR) vaccine. Adults born before 1957 generally are considered immune to measles and mumps. Adults born in 1957 or later should have 1 or more doses of MMR vaccine unless there is a contraindication to the vaccine or there is laboratory evidence of immunity to   each of the three diseases. A routine second dose of MMR vaccine should be obtained at least 28 days after the first dose for students attending postsecondary schools, health care workers, or international travelers. People who received inactivated measles vaccine or an unknown type of measles vaccine during 1963-1967 should receive 2 doses of MMR vaccine. People who received inactivated mumps vaccine or an unknown type of mumps vaccine before 1979 and are at high risk for mumps infection should consider immunization with 2 doses of MMR vaccine. For females of childbearing age, rubella immunity should be determined. If there is no evidence of immunity, females who are not pregnant should be vaccinated. If there is no evidence of immunity, females who are pregnant should delay immunization until after pregnancy. Unvaccinated health care workers born before [birth year] who lack laboratory evidence of measles, mumps, or rubella immunity or laboratory confirmation of disease should consider measles and mumps immunization with 2 doses of MMR vaccine or rubella immunization with 1 dose of MMR vaccine.  Pneumococcal 13-valent conjugate (PCV13) vaccine. When indicated, a person who is uncertain of her immunization history and has no record of immunization should receive the PCV13 vaccine. An adult aged 87 years or older who has certain medical conditions and has not been previously immunized should receive 1 dose of PCV13 vaccine. This PCV13 should be followed with a dose of pneumococcal polysaccharide (PPSV23) vaccine. The PPSV23 vaccine dose should be obtained at least 8 weeks after the dose of PCV13 vaccine. An adult aged 70  years or older who has certain medical conditions and previously received 1 or more doses of PPSV23 vaccine should receive 1 dose of PCV13. The PCV13 vaccine dose should be obtained 1 or more years after the last PPSV23 vaccine dose.  Pneumococcal polysaccharide (PPSV23) vaccine. When PCV13 is also indicated, PCV13 should be obtained first. All adults aged 3 years and older should be immunized. An adult younger than age 36 years who has certain medical conditions should be immunized. Any person who resides in a nursing home or long-term care facility should be immunized. An adult smoker should be immunized. People with an immunocompromised condition and certain other conditions should receive both PCV13 and PPSV23 vaccines. People with human immunodeficiency virus (HIV) infection should be immunized as soon as possible after diagnosis. Immunization during chemotherapy or radiation therapy should be avoided. Routine use of PPSV23 vaccine is not recommended for American Indians, [place]atives, or people younger than 65 years unless there are medical conditions that require PPSV23 vaccine. When indicated, people who have unknown immunization and have no record of immunization should receive PPSV23 vaccine. One-time revaccination 5 years after the first dose of PPSV23 is recommended for people aged 19-64 years who have chronic kidney failure, nephrotic syndrome, asplenia, or immunocompromised conditions. People who received 1-2 doses of PPSV23 before age 70 years should receive another dose of PPSV23 vaccine at age 20 years or later if at least 5 years have passed since the previous dose. Doses of PPSV23 are not needed for people immunized with PPSV23 at or after age 22 years.  Meningococcal vaccine. Adults with asplenia or persistent complement component deficiencies should receive 2 doses of quadrivalent meningococcal conjugate (MenACWY-D) vaccine. The doses should be obtained at least 2 months apart.  Microbiologists working with certain meningococcal bacteria, [place] recruits, people at risk during an outbreak, and people who travel to or live in countries with a high rate of meningitis should be immunized. A first-year college student up through age  21 years who is living in a residence hall should receive a dose if she did not receive a dose on or after her 16th birthday. Adults who have certain high-risk conditions should receive one or more doses of vaccine.  Hepatitis A vaccine. Adults who wish to be protected from this disease, have certain high-risk conditions, work with hepatitis A-infected animals, work in hepatitis A research labs, or travel to or work in countries with a high rate of hepatitis A should be immunized. Adults who were previously unvaccinated and who anticipate close contact with an international adoptee during the first 60 days after arrival in the [place] from a country with a high rate of hepatitis A should be immunized.  Hepatitis B vaccine. Adults who wish to be protected from this disease, have certain high-risk conditions, may be exposed to blood or other infectious body fluids, are household contacts or sex partners of hepatitis B positive people, are clients or workers in certain care facilities, or travel to or work in countries with a high rate of hepatitis B should be immunized.  Haemophilus influenzae type b (Hib) vaccine. A previously unvaccinated person with asplenia or sickle cell disease or having a scheduled splenectomy should receive 1 dose of Hib vaccine. Regardless of previous immunization, a recipient of a hematopoietic stem cell transplant should receive a 3-dose series 6-12 months after her successful transplant. Hib vaccine is not recommended for adults with HIV infection. Preventive Services / Frequency Ages 64 to 68 years  Blood pressure check.** / Every 1 to 2 years.  Lipid and cholesterol check.** / Every 5 years beginning at age  22.  Clinical breast exam.** / Every 3 years for women in their 88s and 53s.  BRCA-related cancer risk assessment.** / For women who have family members with a BRCA-related cancer (breast, ovarian, tubal, or peritoneal cancers).  Pap test.** / Every 2 years from ages 90 through 51. Every 3 years starting at age 21 through age 56 or 3 with a history of 3 consecutive normal Pap tests.  HPV screening.** / Every 3 years from ages 24 through ages 1 to 46 with a history of 3 consecutive normal Pap tests.  Hepatitis C blood test.** / For any individual with known risks for hepatitis C.  Skin self-exam. / Monthly.  Influenza vaccine. / Every year.  Tetanus, diphtheria, and acellular pertussis (Tdap, Td) vaccine.** / Consult your health care provider. Pregnant women should receive 1 dose of Tdap vaccine during each pregnancy. 1 dose of Td every 10 years.  Varicella vaccine.** / Consult your health care provider. Pregnant females who do not have evidence of immunity should receive the first dose after pregnancy.  HPV vaccine. / 3 doses over 6 months, if 72 and younger. The vaccine is not recommended for use in pregnant females. However, pregnancy testing is not needed before receiving a dose.  Measles, mumps, rubella (MMR) vaccine.** / You need at least 1 dose of MMR if you were born in 1957 or later. You may also need a 2nd dose. For females of childbearing age, rubella immunity should be determined. If there is no evidence of immunity, females who are not pregnant should be vaccinated. If there is no evidence of immunity, females who are pregnant should delay immunization until after pregnancy.  Pneumococcal 13-valent conjugate (PCV13) vaccine.** / Consult your health care provider.  Pneumococcal polysaccharide (PPSV23) vaccine.** / 1 to 2 doses if you smoke cigarettes or if you have certain conditions.  Meningococcal vaccine.** /  1 dose if you are age 19 to 21 years and a first-year college  student living in a residence hall, or have one of several medical conditions, you need to get vaccinated against meningococcal disease. You may also need additional booster doses.  Hepatitis A vaccine.** / Consult your health care provider.  Hepatitis B vaccine.** / Consult your health care provider.  Haemophilus influenzae type b (Hib) vaccine.** / Consult your health care provider. Ages 40 to 64 years  Blood pressure check.** / Every 1 to 2 years.  Lipid and cholesterol check.** / Every 5 years beginning at age 20 years.  Lung cancer screening. / Every year if you are aged 55-80 years and have a 30-pack-year history of smoking and currently smoke or have quit within the past 15 years. Yearly screening is stopped once you have quit smoking for at least 15 years or develop a health problem that would prevent you from having lung cancer treatment.  Clinical breast exam.** / Every year after age 40 years.  BRCA-related cancer risk assessment.** / For women who have family members with a BRCA-related cancer (breast, ovarian, tubal, or peritoneal cancers).  Mammogram.** / Every year beginning at age 40 years and continuing for as long as you are in good health. Consult with your health care provider.  Pap test.** / Every 3 years starting at age 30 years through age 65 or 70 years with a history of 3 consecutive normal Pap tests.  HPV screening.** / Every 3 years from ages 30 years through ages 65 to 70 years with a history of 3 consecutive normal Pap tests.  Fecal occult blood test (FOBT) of stool. / Every year beginning at age 50 years and continuing until age 75 years. You may not need to do this test if you get a colonoscopy every 10 years.  Flexible sigmoidoscopy or colonoscopy.** / Every 5 years for a flexible sigmoidoscopy or every 10 years for a colonoscopy beginning at age 50 years and continuing until age 75 years.  Hepatitis C blood test.** / For all people born from 1945 through  1965 and any individual with known risks for hepatitis C.  Skin self-exam. / Monthly.  Influenza vaccine. / Every year.  Tetanus, diphtheria, and acellular pertussis (Tdap/Td) vaccine.** / Consult your health care provider. Pregnant women should receive 1 dose of Tdap vaccine during each pregnancy. 1 dose of Td every 10 years.  Varicella vaccine.** / Consult your health care provider. Pregnant females who do not have evidence of immunity should receive the first dose after pregnancy.  Zoster vaccine.** / 1 dose for adults aged 60 years or older.  Measles, mumps, rubella (MMR) vaccine.** / You need at least 1 dose of MMR if you were born in 1957 or later. You may also need a 2nd dose. For females of childbearing age, rubella immunity should be determined. If there is no evidence of immunity, females who are not pregnant should be vaccinated. If there is no evidence of immunity, females who are pregnant should delay immunization until after pregnancy.  Pneumococcal 13-valent conjugate (PCV13) vaccine.** / Consult your health care provider.  Pneumococcal polysaccharide (PPSV23) vaccine.** / 1 to 2 doses if you smoke cigarettes or if you have certain conditions.  Meningococcal vaccine.** / Consult your health care provider.  Hepatitis A vaccine.** / Consult your health care provider.  Hepatitis B vaccine.** / Consult your health care provider.  Haemophilus influenzae type b (Hib) vaccine.** / Consult your health care provider. Ages 65   years and over  Blood pressure check.** / Every 1 to 2 years.  Lipid and cholesterol check.** / Every 5 years beginning at age 22 years.  Lung cancer screening. / Every year if you are aged 73-80 years and have a 30-pack-year history of smoking and currently smoke or have quit within the past 15 years. Yearly screening is stopped once you have quit smoking for at least 15 years or develop a health problem that would prevent you from having lung cancer  treatment.  Clinical breast exam.** / Every year after age 4 years.  BRCA-related cancer risk assessment.** / For women who have family members with a BRCA-related cancer (breast, ovarian, tubal, or peritoneal cancers).  Mammogram.** / Every year beginning at age 40 years and continuing for as long as you are in good health. Consult with your health care provider.  Pap test.** / Every 3 years starting at age 9 years through age 34 or 91 years with 3 consecutive normal Pap tests. Testing can be stopped between 65 and 70 years with 3 consecutive normal Pap tests and no abnormal Pap or HPV tests in the past 10 years.  HPV screening.** / Every 3 years from ages 57 years through ages 64 or 45 years with a history of 3 consecutive normal Pap tests. Testing can be stopped between 65 and 70 years with 3 consecutive normal Pap tests and no abnormal Pap or HPV tests in the past 10 years.  Fecal occult blood test (FOBT) of stool. / Every year beginning at age 15 years and continuing until age 17 years. You may not need to do this test if you get a colonoscopy every 10 years.  Flexible sigmoidoscopy or colonoscopy.** / Every 5 years for a flexible sigmoidoscopy or every 10 years for a colonoscopy beginning at age 86 years and continuing until age 71 years.  Hepatitis C blood test.** / For all people born from [birth year] through 1963 and any individual with known risks for hepatitis C.  Osteoporosis screening.** / A one-time screening for women ages 83 years and over and women at risk for fractures or osteoporosis.  Skin self-exam. / Monthly.  Influenza vaccine. / Every year.  Tetanus, diphtheria, and acellular pertussis (Tdap/Td) vaccine.** / 1 dose of Td every 10 years.  Varicella vaccine.** / Consult your health care provider.  Zoster vaccine.** / 1 dose for adults aged 61 years or older.  Pneumococcal 13-valent conjugate (PCV13) vaccine.** / Consult your health care provider.  Pneumococcal  polysaccharide (PPSV23) vaccine.** / 1 dose for all adults aged 28 years and older.  Meningococcal vaccine.** / Consult your health care provider.  Hepatitis A vaccine.** / Consult your health care provider.  Hepatitis B vaccine.** / Consult your health care provider.  Haemophilus influenzae type b (Hib) vaccine.** / Consult your health care provider. ** Family history and personal history of risk and conditions may change your health care provider's recommendations. Document Released: 06/07/2001 Document Revised: 08/26/2013 Document Reviewed: 09/06/2010 [facility] Patient Information 2015 [place], [place]. This information is not intended to replace advice given to you by your health care provider. Make sure you discuss any questions you have with your health care provider.

## 2014-09-24 NOTE — Progress Notes (Signed)
Subjective:  Patient ID: [name], female    DOB: 1925/05/25  Age: 81 y.o. MRN: [id number]  CC: Osteoarthritis; Annual Exam; and Gastrophageal Reflux   HPI [name] presents for a CPX - she complains of joint pains and chronic constipation but otherwise offers no complaints.  Outpatient Prescriptions Prior to Visit  Medication Sig Dispense Refill  . aspirin EC 81 MG tablet Take 81 mg by mouth daily.    Marland Kitchen HYDROcodone-acetaminophen (NORCO) 10-325 MG per tablet Take 0.5-1 tablets by mouth every 4 (four) hours as needed for pain. 60 tablet 1  . omeprazole (PRILOSEC) 20 MG capsule Take 20 mg by mouth daily.    Marland Kitchen OVER THE COUNTER MEDICATION Take 1 tablet by mouth 2 (two) times daily. preservision vitamin for eye care    . travoprost, benzalkonium, (TRAVATAN) 0.004 % ophthalmic solution Place 1 drop into both eyes at bedtime.     Marland Kitchen BIOTIN PO Take 1 tablet by mouth daily.    . ciprofloxacin (CIPRO) 250 MG tablet Take 1 tablet (250 mg total) by mouth 2 (two) times daily. (Patient not taking: Reported on 09/24/2014) 14 tablet 0  . escitalopram (LEXAPRO) 20 MG tablet Take 1 tablet (20 mg total) by mouth daily. DAW (Patient not taking: Reported on 09/24/2014) 30 tablet 1   No facility-administered medications prior to visit.    ROS Review of Systems  Constitutional: Negative.  Negative for fever, chills, diaphoresis, appetite change and fatigue.  HENT: Negative.  Negative for trouble swallowing and voice change.   Eyes: Negative.   Respiratory: Negative.  Negative for cough, choking, chest tightness, shortness of breath and stridor.   Cardiovascular: Negative.  Negative for chest pain, palpitations and leg swelling.  Gastrointestinal: Positive for constipation. Negative for nausea, vomiting, abdominal pain, diarrhea, blood in stool, abdominal distention, anal bleeding and rectal pain.  Endocrine: Negative.   Genitourinary: Negative.   Musculoskeletal: Positive for arthralgias.  Negative for myalgias, back pain, joint swelling, gait problem, neck pain and neck stiffness.  Skin: Negative.  Negative for rash.  Allergic/Immunologic: Negative.   Neurological: Negative.   Hematological: Negative.  Negative for adenopathy. Does not bruise/bleed easily.  Psychiatric/Behavioral: Negative.     Objective:  BP 120/64 mmHg  Pulse 60  Temp(Src) 98.6 F (37 C) (Oral)  Resp 16  Ht 5\' 2"  (1.575 m)  Wt 127 lb (57.607 kg)  BMI 23.22 kg/m2  SpO2 98%  BP Readings from Last 3 Encounters:  09/24/14 120/64  04/15/13 118/62  01/02/13 130/66    Wt Readings from Last 3 Encounters:  09/24/14 127 lb (57.607 kg)  04/15/13 130 lb (58.968 kg)  01/02/13 131 lb (59.421 kg)    Physical Exam  Constitutional: She is oriented to person, place, and time. She appears well-developed and well-nourished. No distress.  HENT:  Head: Normocephalic and atraumatic.  Mouth/Throat: Oropharynx is clear and moist. No oropharyngeal exudate.  Eyes: Conjunctivae are normal. Right eye exhibits no discharge. Left eye exhibits no discharge. No scleral icterus.  Neck: Normal range of motion. Neck supple. No JVD present. No tracheal deviation present. No thyromegaly present.  Cardiovascular: Normal rate, regular rhythm, normal heart sounds and intact distal pulses.  Exam reveals no gallop and no friction rub.   No murmur heard. Pulmonary/Chest: Effort normal and breath sounds normal. No stridor. No respiratory distress. She has no wheezes. She has no rales. She exhibits no tenderness.  Abdominal: Soft. Bowel sounds are normal. She exhibits no distension and no mass.  There is no tenderness. There is no rebound and no guarding.  Musculoskeletal: Normal range of motion. She exhibits no edema or tenderness.  Lymphadenopathy:    She has no cervical adenopathy.  Neurological: She is oriented to person, place, and time.  Skin: Skin is warm and dry. No rash noted. She is not diaphoretic. No erythema. No pallor.   Psychiatric: She has a normal mood and affect. Her behavior is normal. Judgment and thought content normal.  Vitals reviewed.   Lab Results  Component Value Date   WBC 7.9 06/28/2012   HGB 12.4 06/28/2012   HCT 36.7 06/28/2012   PLT 351 06/28/2012   GLUCOSE 94 06/28/2012   CHOL 226* 09/03/2007   TRIG 138 09/03/2007   HDL 84.0 09/03/2007   LDLDIRECT 106.0 09/03/2007   ALT 14 03/05/2009   AST 19 03/05/2009   NA 140 06/28/2012   K 4.2 06/28/2012   CL 104 06/28/2012   CREATININE 0.89 06/28/2012   BUN 18 06/28/2012   CO2 23 06/28/2012   TSH 2.30 03/05/2009   INR 0.92 06/28/2012    Dg Chest 2 View  06/28/2012   *RADIOLOGY REPORT*  Clinical Data: Right shoulder arthroplasty.  CHEST - 2 VIEW  Comparison: 08/30/2010.  Findings: Trachea is midline.  Heart size normal.  Biapical pleural thickening.  Lungs are hyperinflated.  Minimal linear scarring at the lung bases.  Lungs are otherwise clear.  No pleural fluid. Left shoulder arthroplasties incidentally noted.  IMPRESSION: COPD without acute finding.   Original Report Authenticated By: Lorin Picket, M.D.    Assessment & Plan:   [name] was seen today for osteoarthritis, annual exam and gastrophageal reflux.  Diagnoses and all orders for this visit:  Primary osteoarthritis involving multiple joints - this is managed by pain mngt, she currently tales oxycodone for pain Orders: -     Comprehensive metabolic panel; Future -     TSH; Future -     Urinalysis, Routine w reflex microscopic (not at [facility]); Future  Other constipation - will check her labs to screen for secondary causes, she does not want to treat this at this time Orders: -     Urinalysis, Routine w reflex microscopic (not at [facility]); Future  Gastroesophageal reflux disease without esophagitis - doing well on PPI Orders: -     CBC with Differential/Platelet; Future -     Urinalysis, Routine w reflex microscopic (not at [facility]); Future  Hyperlipidemia with target LDL  less than 160 - she does not want to start a statin Orders: -     Lipid panel; Future -     Comprehensive metabolic panel; Future -     TSH; Future -     Urinalysis, Routine w reflex microscopic (not at [facility]); Future   I have discontinued Ms. Tormey's BIOTIN PO, escitalopram, and ciprofloxacin. I am also having her maintain her travoprost (benzalkonium), OVER THE COUNTER MEDICATION, aspirin EC, omeprazole, HYDROcodone-acetaminophen, and beta carotene w/minerals.  Meds ordered this encounter  Medications  . beta carotene w/minerals (OCUVITE) tablet    Sig: Take 1 tablet by mouth 2 (two) times daily.   See AVS for instructions about healthy living and anticipatory guidance.  Follow-up: No Follow-up on file.  Scarlette Calico, MD

## 2014-09-24 NOTE — Progress Notes (Signed)
Pre visit review using our clinic review tool, if applicable. No additional management support is needed unless otherwise documented below in the visit note. 

## 2014-09-24 NOTE — Assessment & Plan Note (Signed)

## 2014-09-25 ENCOUNTER — Encounter: Payer: Self-pay | Admitting: Internal Medicine

## 2014-09-30 DIAGNOSIS — M545 Low back pain: Secondary | ICD-10-CM | POA: Diagnosis not present

## 2014-10-10 DIAGNOSIS — M5136 Other intervertebral disc degeneration, lumbar region: Secondary | ICD-10-CM | POA: Diagnosis not present

## 2014-10-10 DIAGNOSIS — M533 Sacrococcygeal disorders, not elsewhere classified: Secondary | ICD-10-CM | POA: Diagnosis not present

## 2014-10-10 DIAGNOSIS — M961 Postlaminectomy syndrome, not elsewhere classified: Secondary | ICD-10-CM | POA: Diagnosis not present

## 2014-10-10 DIAGNOSIS — G894 Chronic pain syndrome: Secondary | ICD-10-CM | POA: Diagnosis not present

## 2014-10-28 DIAGNOSIS — M533 Sacrococcygeal disorders, not elsewhere classified: Secondary | ICD-10-CM | POA: Diagnosis not present

## 2014-10-28 DIAGNOSIS — M961 Postlaminectomy syndrome, not elsewhere classified: Secondary | ICD-10-CM | POA: Diagnosis not present

## 2014-10-28 DIAGNOSIS — M5136 Other intervertebral disc degeneration, lumbar region: Secondary | ICD-10-CM | POA: Diagnosis not present

## 2014-10-28 DIAGNOSIS — G894 Chronic pain syndrome: Secondary | ICD-10-CM | POA: Diagnosis not present

## 2014-11-03 DIAGNOSIS — Z961 Presence of intraocular lens: Secondary | ICD-10-CM | POA: Diagnosis not present

## 2014-11-03 DIAGNOSIS — H04123 Dry eye syndrome of bilateral lacrimal glands: Secondary | ICD-10-CM | POA: Diagnosis not present

## 2014-11-03 DIAGNOSIS — H3531 Nonexudative age-related macular degeneration: Secondary | ICD-10-CM | POA: Diagnosis not present

## 2014-11-03 DIAGNOSIS — H43811 Vitreous degeneration, right eye: Secondary | ICD-10-CM | POA: Diagnosis not present

## 2014-11-14 ENCOUNTER — Telehealth: Payer: Self-pay | Admitting: Internal Medicine

## 2014-11-14 NOTE — Telephone Encounter (Signed)
Received records from [facility] forwarded to Dr. Scarlette Calico 11/14/14 fbg.

## 2014-12-02 DIAGNOSIS — M533 Sacrococcygeal disorders, not elsewhere classified: Secondary | ICD-10-CM | POA: Diagnosis not present

## 2014-12-23 DIAGNOSIS — G894 Chronic pain syndrome: Secondary | ICD-10-CM | POA: Diagnosis not present

## 2014-12-23 DIAGNOSIS — M5136 Other intervertebral disc degeneration, lumbar region: Secondary | ICD-10-CM | POA: Diagnosis not present

## 2014-12-23 DIAGNOSIS — M961 Postlaminectomy syndrome, not elsewhere classified: Secondary | ICD-10-CM | POA: Diagnosis not present

## 2015-01-21 DIAGNOSIS — Z23 Encounter for immunization: Secondary | ICD-10-CM | POA: Diagnosis not present

## 2015-01-26 DIAGNOSIS — H353211 Exudative age-related macular degeneration, right eye, with active choroidal neovascularization: Secondary | ICD-10-CM | POA: Diagnosis not present

## 2015-02-16 DIAGNOSIS — G894 Chronic pain syndrome: Secondary | ICD-10-CM | POA: Diagnosis not present

## 2015-02-16 DIAGNOSIS — M961 Postlaminectomy syndrome, not elsewhere classified: Secondary | ICD-10-CM | POA: Diagnosis not present

## 2015-02-16 DIAGNOSIS — M5136 Other intervertebral disc degeneration, lumbar region: Secondary | ICD-10-CM | POA: Diagnosis not present

## 2015-03-02 DIAGNOSIS — G894 Chronic pain syndrome: Secondary | ICD-10-CM | POA: Diagnosis not present

## 2015-03-02 DIAGNOSIS — M961 Postlaminectomy syndrome, not elsewhere classified: Secondary | ICD-10-CM | POA: Diagnosis not present

## 2015-03-02 DIAGNOSIS — M5136 Other intervertebral disc degeneration, lumbar region: Secondary | ICD-10-CM | POA: Diagnosis not present

## 2015-03-30 DIAGNOSIS — H04123 Dry eye syndrome of bilateral lacrimal glands: Secondary | ICD-10-CM | POA: Diagnosis not present

## 2015-03-30 DIAGNOSIS — H409 Unspecified glaucoma: Secondary | ICD-10-CM | POA: Diagnosis not present

## 2015-03-30 DIAGNOSIS — Z961 Presence of intraocular lens: Secondary | ICD-10-CM | POA: Diagnosis not present

## 2015-04-02 DIAGNOSIS — L570 Actinic keratosis: Secondary | ICD-10-CM | POA: Diagnosis not present

## 2015-04-02 DIAGNOSIS — L820 Inflamed seborrheic keratosis: Secondary | ICD-10-CM | POA: Diagnosis not present

## 2015-05-28 DIAGNOSIS — M5136 Other intervertebral disc degeneration, lumbar region: Secondary | ICD-10-CM | POA: Diagnosis not present

## 2015-05-28 DIAGNOSIS — Z79891 Long term (current) use of opiate analgesic: Secondary | ICD-10-CM | POA: Diagnosis not present

## 2015-05-28 DIAGNOSIS — G894 Chronic pain syndrome: Secondary | ICD-10-CM | POA: Diagnosis not present

## 2015-05-28 DIAGNOSIS — M961 Postlaminectomy syndrome, not elsewhere classified: Secondary | ICD-10-CM | POA: Diagnosis not present

## 2015-06-01 DIAGNOSIS — H35352 Cystoid macular degeneration, left eye: Secondary | ICD-10-CM | POA: Diagnosis not present

## 2015-06-01 DIAGNOSIS — H35351 Cystoid macular degeneration, right eye: Secondary | ICD-10-CM | POA: Diagnosis not present

## 2015-06-01 DIAGNOSIS — H353211 Exudative age-related macular degeneration, right eye, with active choroidal neovascularization: Secondary | ICD-10-CM | POA: Diagnosis not present

## 2015-06-01 DIAGNOSIS — H353134 Nonexudative age-related macular degeneration, bilateral, advanced atrophic with subfoveal involvement: Secondary | ICD-10-CM | POA: Diagnosis not present

## 2015-06-03 ENCOUNTER — Encounter: Payer: Self-pay | Admitting: Nurse Practitioner

## 2015-06-03 ENCOUNTER — Other Ambulatory Visit: Payer: Medicare Other

## 2015-06-03 ENCOUNTER — Ambulatory Visit (INDEPENDENT_AMBULATORY_CARE_PROVIDER_SITE_OTHER): Payer: Medicare Other | Admitting: Nurse Practitioner

## 2015-06-03 VITALS — BP 124/70 | HR 71 | Temp 98.3°F | Resp 12 | Ht 62.0 in | Wt 121.1 lb

## 2015-06-03 DIAGNOSIS — R300 Dysuria: Secondary | ICD-10-CM

## 2015-06-03 DIAGNOSIS — J309 Allergic rhinitis, unspecified: Secondary | ICD-10-CM | POA: Diagnosis not present

## 2015-06-03 DIAGNOSIS — N390 Urinary tract infection, site not specified: Secondary | ICD-10-CM | POA: Insufficient documentation

## 2015-06-03 LAB — POCT URINALYSIS DIPSTICK
BILIRUBIN UA: NEGATIVE
GLUCOSE UA: NEGATIVE
KETONES UA: NEGATIVE
PH UA: 6
Protein, UA: NEGATIVE
RBC UA: NEGATIVE
Spec Grav, UA: >=1.03
Urobilinogen, UA: NEGATIVE

## 2015-06-03 MED ORDER — NITROFURANTOIN MONOHYD MACRO 100 MG PO CAPS: 100.0000 mg | ORAL_CAPSULE | Freq: Two times a day (BID) | ORAL | Status: DC

## 2015-06-03 NOTE — Progress Notes (Signed)
Pre visit review using our clinic review tool, if applicable. No additional management support is needed unless otherwise documented below in the visit note. 

## 2015-06-03 NOTE — Progress Notes (Signed)
Patient ID: [name], female    DOB: Nov 19, 1925  Age: 82 y.o. MRN: [id number]  CC: Acute Visit   HPI [name] presents for CC of dysuria x 2 days.   1) Pain and burning with urination  Started yesterday  Denies swimming or sexual activity   Treatment- drinking lots of water Denies other treatments   Last UTI treated on 09/24/14  History [name] has a past medical history of Lack of bladder control; Renal cyst; Macular degeneration; Glaucoma; Arthritis; Insomnia; Small bowel obstruction ([place]); Gastritis; Degenerative joint disease; Constipation; Trigeminal neuralgia; OA (osteoarthritis); GERD (gastroesophageal reflux disease); Depression; COPD, moderate ([place]); Allergy; Adenomatous colon polyp (04/26/95); and Shortness of breath.   She has past surgical history that includes Tonsillectomy; Dilation and curettage of uterus; Abdominal hysterectomy; Joint replacement; Cholecystectomy; Partial colectomy (04/26/95); Back surgery (05/20/08); Cataract extraction; Shoulder surgery (12/24/08); and Total shoulder arthroplasty (Right, 07/05/2012).   Her family history includes Cancer in her father; Colon polyps in her brother; Diabetes in her brother; Heart disease in her sister.She reports that she quit smoking about 9 years ago. Her smoking use included Cigarettes. She has a 50 pack-year smoking history. She does not have any smokeless tobacco history on file. She reports that she does not drink alcohol or use illicit drugs.  Outpatient Prescriptions Prior to Visit  Medication Sig Dispense Refill  . aspirin EC 81 MG tablet Take 81 mg by mouth daily.    . beta carotene w/minerals (OCUVITE) tablet Take 1 tablet by mouth 2 (two) times daily.    Marland Kitchen omeprazole (PRILOSEC) 20 MG capsule Take 20 mg by mouth daily.    Marland Kitchen OVER THE COUNTER MEDICATION Take 1 tablet by mouth 2 (two) times daily. preservision vitamin for eye care    . travoprost, benzalkonium, (TRAVATAN) 0.004 % ophthalmic solution Place 1  drop into both eyes at bedtime.      No facility-administered medications prior to visit.    ROS Review of Systems  Constitutional: Negative for fever, chills, diaphoresis and fatigue.  Genitourinary: Positive for dysuria, urgency and frequency. Negative for hematuria, flank pain, decreased urine volume, difficulty urinating and pelvic pain.  Skin: Negative for rash.    Objective:  BP 124/70 mmHg  Pulse 71  Temp(Src) 98.3 F (36.8 C) (Oral)  Resp 12  Ht 5\' 2"  (1.575 m)  Wt 121 lb 1.9 oz (54.94 kg)  BMI 22.15 kg/m2  SpO2 96%  Physical Exam  Constitutional: She is oriented to person, place, and time. She appears well-developed and well-nourished. No distress.  HENT:  Head: Normocephalic and atraumatic.  Right Ear: External ear normal.  Left Ear: External ear normal.  Eyes: Right eye exhibits no discharge. Left eye exhibits no discharge. No scleral icterus.  Abdominal: There is no CVA tenderness.  Neurological: She is alert and oriented to person, place, and time.  Skin: Skin is warm and dry. No rash noted. She is not diaphoretic.  Psychiatric: She has a normal mood and affect. Her behavior is normal. Judgment and thought content normal.   Assessment & Plan:   [name] was seen today for acute visit.  Diagnoses and all orders for this visit:  Dysuria -     POCT urinalysis dipstick -     CULTURE, URINE COMPREHENSIVE; Future  Allergic rhinitis, unspecified allergic rhinitis type  Other orders -     nitrofurantoin, macrocrystal-monohydrate, (MACROBID) 100 MG capsule; Take 1 capsule (100 mg total) by mouth 2 (two) times daily.   I am  having [name] start on nitrofurantoin (macrocrystal-monohydrate). I am also having her maintain her travoprost (benzalkonium), OVER THE COUNTER MEDICATION, aspirin EC, omeprazole, and beta carotene w/minerals.  Meds ordered this encounter  Medications  . nitrofurantoin, macrocrystal-monohydrate, (MACROBID) 100 MG capsule    Sig: Take 1  capsule (100 mg total) by mouth 2 (two) times daily.    Dispense:  10 capsule    Refill:  0    Order Specific Question:  Supervising Provider    Answer:  Crecencio Mc [[id number]]     Follow-up: Return if symptoms worsen or fail to improve.

## 2015-06-03 NOTE — Patient Instructions (Addendum)
Azo over the counter- Azo cranberry may help keep infection away by allowing the bacteria to not stick to the lining of your urethra.   Allegra and Zyrtec are the next two options to try besides Claritin. Saline nasal spray helps, too.

## 2015-06-05 DIAGNOSIS — R300 Dysuria: Secondary | ICD-10-CM | POA: Insufficient documentation

## 2015-06-05 LAB — CULTURE, URINE COMPREHENSIVE: Colony Count: >=100000

## 2015-06-05 NOTE — Assessment & Plan Note (Signed)
Pt does not feel claritin is helpful now Willing to try Allegra or Zyrtec OTC Advised saline nasal spray as well.

## 2015-06-05 NOTE — Assessment & Plan Note (Signed)
New problem to me Worsening since yesterday POCT urine probable for infection Will start nitrofurantoin Will await culture

## 2015-07-15 DIAGNOSIS — H52203 Unspecified astigmatism, bilateral: Secondary | ICD-10-CM | POA: Diagnosis not present

## 2015-07-15 DIAGNOSIS — H04123 Dry eye syndrome of bilateral lacrimal glands: Secondary | ICD-10-CM | POA: Diagnosis not present

## 2015-07-15 DIAGNOSIS — H401123 Primary open-angle glaucoma, left eye, severe stage: Secondary | ICD-10-CM | POA: Diagnosis not present

## 2015-07-15 DIAGNOSIS — H401113 Primary open-angle glaucoma, right eye, severe stage: Secondary | ICD-10-CM | POA: Diagnosis not present

## 2015-08-25 DIAGNOSIS — G894 Chronic pain syndrome: Secondary | ICD-10-CM | POA: Diagnosis not present

## 2015-08-25 DIAGNOSIS — M5136 Other intervertebral disc degeneration, lumbar region: Secondary | ICD-10-CM | POA: Diagnosis not present

## 2015-08-25 DIAGNOSIS — M961 Postlaminectomy syndrome, not elsewhere classified: Secondary | ICD-10-CM | POA: Diagnosis not present

## 2015-08-25 DIAGNOSIS — Z79891 Long term (current) use of opiate analgesic: Secondary | ICD-10-CM | POA: Diagnosis not present

## 2015-09-02 ENCOUNTER — Encounter: Payer: Self-pay | Admitting: Adult Health

## 2015-09-02 ENCOUNTER — Ambulatory Visit (INDEPENDENT_AMBULATORY_CARE_PROVIDER_SITE_OTHER): Payer: Medicare Other | Admitting: Adult Health

## 2015-09-02 VITALS — BP 118/60 | Temp 98.4°F | Wt 117.9 lb

## 2015-09-02 DIAGNOSIS — R300 Dysuria: Secondary | ICD-10-CM | POA: Diagnosis not present

## 2015-09-02 DIAGNOSIS — N3000 Acute cystitis without hematuria: Secondary | ICD-10-CM

## 2015-09-02 LAB — POCT URINALYSIS DIPSTICK
Bilirubin, UA: NEGATIVE
GLUCOSE UA: NEGATIVE
Ketones, UA: NEGATIVE
NITRITE UA: NEGATIVE
Protein, UA: NEGATIVE
RBC UA: NEGATIVE
Spec Grav, UA: >=1.03
UROBILINOGEN UA: 0.2
pH, UA: 5.5

## 2015-09-02 MED ORDER — CIPROFLOXACIN HCL 500 MG PO TABS: 500.0000 mg | ORAL_TABLET | Freq: Two times a day (BID) | ORAL | Status: DC

## 2015-09-02 NOTE — Progress Notes (Signed)
  [id number] Ronnald Ramp, MD No chief complaint on file.   Current Issues:  Presents with 3 days of dysuria Associated symptoms include:  cloudy urine, dysuria, urinary frequency, urinary hesitancy, urinary incontinence, urinary retention and urinary urgency  There is a previous history of of similar symptoms.   Prior to Admission medications   Medication Sig Start Date End Date Taking? Authorizing Provider  aspirin EC 81 MG tablet Take 81 mg by mouth daily.   Yes Historical Provider, MD  beta carotene w/minerals (OCUVITE) tablet Take 1 tablet by mouth 2 (two) times daily.   Yes Historical Provider, MD  omeprazole (PRILOSEC) 20 MG capsule Take 20 mg by mouth daily.   Yes Historical Provider, MD  OVER THE COUNTER MEDICATION Take 1 tablet by mouth 2 (two) times daily. preservision vitamin for eye care   Yes Historical Provider, MD  travoprost, benzalkonium, (TRAVATAN) 0.004 % ophthalmic solution Place 1 drop into both eyes at bedtime.    Yes Historical Provider, MD    Review of Systems:   PE:  BP 118/60 mmHg  Temp(Src) 98.4 F (36.9 C) (Oral)  Wt 117 lb 14.4 oz (53.479 kg) Constitutional: Alert and oriented, does not appear in any acute distress Heart: Normal rate and normal rhythm Lungs: Clear to auscultation  Back: No CVA tednerness Abdomen: Discomfort with palpation to suprapubic area  Results for orders placed or performed in visit on 09/02/15  POC Urinalysis Dipstick  Result Value Ref Range   Color, UA Yellow    Clarity, UA Clear    Glucose, UA Neg    Bilirubin, UA Neg    Ketones, UA Neg    Spec Grav, UA >=1.030    Blood, UA Neg    pH, UA 5.5    Protein, UA Neg    Urobilinogen, UA 0.2    Nitrite, UA Neg    Leukocytes, UA moderate (2+) (A) Negative   1. Dysuria - POC Urinalysis Dipstick - Urine culture  2. Acute cystitis without hematuria [N30.00] - POC Urinalysis Dipstick - Urine culture - ciprofloxacin (CIPRO) 500 MG tablet; Take 1 tablet (500 mg total) by mouth 2  (two) times daily.  Dispense: 10 tablet; Refill: 0 - Follow up with PCP with continued symptoms  Dorothyann Peng, NP

## 2015-09-02 NOTE — Patient Instructions (Signed)

## 2015-09-05 LAB — URINE CULTURE: Colony Count: >=100000

## 2015-10-05 DIAGNOSIS — H353211 Exudative age-related macular degeneration, right eye, with active choroidal neovascularization: Secondary | ICD-10-CM | POA: Diagnosis not present

## 2015-11-18 DIAGNOSIS — H01001 Unspecified blepharitis right upper eyelid: Secondary | ICD-10-CM | POA: Diagnosis not present

## 2015-11-18 DIAGNOSIS — H04123 Dry eye syndrome of bilateral lacrimal glands: Secondary | ICD-10-CM | POA: Diagnosis not present

## 2015-11-18 DIAGNOSIS — H401133 Primary open-angle glaucoma, bilateral, severe stage: Secondary | ICD-10-CM | POA: Diagnosis not present

## 2015-11-18 DIAGNOSIS — H01004 Unspecified blepharitis left upper eyelid: Secondary | ICD-10-CM | POA: Diagnosis not present

## 2015-11-25 DIAGNOSIS — G894 Chronic pain syndrome: Secondary | ICD-10-CM | POA: Diagnosis not present

## 2015-11-25 DIAGNOSIS — M5136 Other intervertebral disc degeneration, lumbar region: Secondary | ICD-10-CM | POA: Diagnosis not present

## 2015-11-25 DIAGNOSIS — Z79891 Long term (current) use of opiate analgesic: Secondary | ICD-10-CM | POA: Diagnosis not present

## 2015-11-25 DIAGNOSIS — M961 Postlaminectomy syndrome, not elsewhere classified: Secondary | ICD-10-CM | POA: Diagnosis not present

## 2016-01-01 ENCOUNTER — Telehealth: Payer: Self-pay

## 2016-01-01 NOTE — Telephone Encounter (Signed)
Pt came in and dropped a form off to be filled out. LOV was 09/2015.  LVM for pt to call back as soon as possible.  RE: Pt needs at least a 30 minute appt to fill out the form.

## 2016-01-01 NOTE — Telephone Encounter (Signed)
Forwarding as FYI to PCP

## 2016-01-06 ENCOUNTER — Ambulatory Visit (INDEPENDENT_AMBULATORY_CARE_PROVIDER_SITE_OTHER): Payer: Medicare Other | Admitting: Internal Medicine

## 2016-01-06 ENCOUNTER — Encounter: Payer: Self-pay | Admitting: Internal Medicine

## 2016-01-06 ENCOUNTER — Other Ambulatory Visit (INDEPENDENT_AMBULATORY_CARE_PROVIDER_SITE_OTHER): Payer: Medicare Other

## 2016-01-06 VITALS — BP 118/76 | HR 82 | Temp 98.3°F | Ht 62.0 in | Wt 116.1 lb

## 2016-01-06 DIAGNOSIS — Z0000 Encounter for general adult medical examination without abnormal findings: Secondary | ICD-10-CM

## 2016-01-06 DIAGNOSIS — K219 Gastro-esophageal reflux disease without esophagitis: Secondary | ICD-10-CM | POA: Diagnosis not present

## 2016-01-06 DIAGNOSIS — E785 Hyperlipidemia, unspecified: Secondary | ICD-10-CM | POA: Diagnosis not present

## 2016-01-06 DIAGNOSIS — Z23 Encounter for immunization: Secondary | ICD-10-CM

## 2016-01-06 LAB — LIPID PANEL
Cholesterol: 223 mg/dL — ABNORMAL HIGH (ref 0–200)
HDL: 70.4 mg/dL (ref >39.00)
LDL Cholesterol: 129 mg/dL — ABNORMAL HIGH (ref 0–99)
NONHDL: 152.92
Total CHOL/HDL Ratio: 3
Triglycerides: 122 mg/dL (ref 0.0–149.0)
VLDL: 24.4 mg/dL (ref 0.0–40.0)

## 2016-01-06 LAB — COMPREHENSIVE METABOLIC PANEL
ALK PHOS: 59 U/L (ref 39–117)
ALT: 13 U/L (ref 0–35)
AST: 17 U/L (ref 0–37)
Albumin: 3.9 g/dL (ref 3.5–5.2)
BILIRUBIN TOTAL: 0.3 mg/dL (ref 0.2–1.2)
BUN: 21 mg/dL (ref 6–23)
CO2: 31 meq/L (ref 19–32)
CREATININE: 0.94 mg/dL (ref 0.40–1.20)
Calcium: 9.5 mg/dL (ref 8.4–10.5)
Chloride: 104 mEq/L (ref 96–112)
GFR: 59.73 mL/min — AB (ref >60.00)
GLUCOSE: 89 mg/dL (ref 70–99)
Potassium: 4.4 mEq/L (ref 3.5–5.1)
SODIUM: 140 meq/L (ref 135–145)
TOTAL PROTEIN: 7.4 g/dL (ref 6.0–8.3)

## 2016-01-06 LAB — CBC WITH DIFFERENTIAL/PLATELET
BASOS ABS: 0.1 10*3/uL (ref 0.0–0.1)
Basophils Relative: 1.1 % (ref 0.0–3.0)
Eosinophils Absolute: 0.3 10*3/uL (ref 0.0–0.7)
Eosinophils Relative: 3.5 % (ref 0.0–5.0)
HCT: 38.6 % (ref 36.0–46.0)
Hemoglobin: 12.9 g/dL (ref 12.0–15.0)
LYMPHS ABS: 3.1 10*3/uL (ref 0.7–4.0)
Lymphocytes Relative: 36.4 % (ref 12.0–46.0)
MCHC: 33.4 g/dL (ref 30.0–36.0)
MCV: 87.5 fl (ref 78.0–100.0)
MONO ABS: 0.8 10*3/uL (ref 0.1–1.0)
Monocytes Relative: 9 % (ref 3.0–12.0)
NEUTROS ABS: 4.3 10*3/uL (ref 1.4–7.7)
NEUTROS PCT: 50 % (ref 43.0–77.0)
PLATELETS: 304 10*3/uL (ref 150.0–400.0)
RBC: 4.42 Mil/uL (ref 3.87–5.11)
RDW: 14.4 % (ref 11.5–15.5)
WBC: 8.5 10*3/uL (ref 4.0–10.5)

## 2016-01-06 LAB — TSH: TSH: 5.16 u[IU]/mL — AB (ref 0.35–4.50)

## 2016-01-06 NOTE — Progress Notes (Signed)
Pre visit review using our clinic review tool, if applicable. No additional management support is needed unless otherwise documented below in the visit note. 

## 2016-01-06 NOTE — Progress Notes (Signed)
Subjective:  Patient ID: [name], female    DOB: Dec 05, 1925  Age: 82 y.o. MRN: [id number]  CC: Annual Exam; Gastroesophageal Reflux; and Hyperlipidemia   HPI [name] presents for a CPX.  She complains of arthritis pain and low back pain and fatigue but otherwise feels well. She sees a pain specialist and takes morphine several times a day for pain control. She offers no other complaints.  Outpatient Medications Prior to Visit  Medication Sig Dispense Refill  . aspirin EC 81 MG tablet Take 81 mg by mouth daily.    . beta carotene w/minerals (OCUVITE) tablet Take 1 tablet by mouth 2 (two) times daily.    Marland Kitchen omeprazole (PRILOSEC) 20 MG capsule Take 20 mg by mouth daily.    Marland Kitchen OVER THE COUNTER MEDICATION Take 1 tablet by mouth 2 (two) times daily. preservision vitamin for eye care    . travoprost, benzalkonium, (TRAVATAN) 0.004 % ophthalmic solution Place 1 drop into both eyes at bedtime.     . ciprofloxacin (CIPRO) 500 MG tablet Take 1 tablet (500 mg total) by mouth 2 (two) times daily. 10 tablet 0   No facility-administered medications prior to visit.     ROS Review of Systems  Constitutional: Negative.  Negative for activity change, appetite change, diaphoresis, fatigue and unexpected weight change.  HENT: Negative.  Negative for sore throat, trouble swallowing and voice change.   Eyes: Negative.  Negative for visual disturbance.  Respiratory: Negative.  Negative for cough, choking, chest tightness, shortness of breath and stridor.   Cardiovascular: Negative.  Negative for chest pain, palpitations and leg swelling.  Gastrointestinal: Negative for abdominal pain, blood in stool, constipation, diarrhea, nausea and vomiting.  Endocrine: Negative.   Genitourinary: Negative.  Negative for difficulty urinating, dysuria, hematuria, menstrual problem, pelvic pain and urgency.  Musculoskeletal: Negative.  Negative for arthralgias, back pain, joint swelling, myalgias and neck  pain.  Skin: Negative.  Negative for color change and rash.  Allergic/Immunologic: Negative.   Neurological: Negative.  Negative for dizziness, tremors, syncope, speech difficulty, light-headedness, numbness and headaches.  Hematological: Negative.  Negative for adenopathy. Does not bruise/bleed easily.  Psychiatric/Behavioral: Negative.     Objective:  BP 118/76 (BP Location: Left Arm, Patient Position: Sitting, Cuff Size: Normal)   Pulse 82   Temp 98.3 F (36.8 C) (Oral)   Ht 5\' 2"  (1.575 m)   Wt 116 lb 1.3 oz (52.7 kg)   SpO2 96%   BMI 21.23 kg/m   BP Readings from Last 3 Encounters:  01/06/16 118/76  09/02/15 118/60  06/03/15 124/70    Wt Readings from Last 3 Encounters:  01/06/16 116 lb 1.3 oz (52.7 kg)  09/02/15 117 lb 14.4 oz (53.5 kg)  06/03/15 121 lb 1.9 oz (54.9 kg)    Physical Exam  Constitutional: She is oriented to person, place, and time. No distress.  HENT:  Mouth/Throat: Oropharynx is clear and moist. No oropharyngeal exudate.  Eyes: Conjunctivae are normal. Right eye exhibits no discharge. Left eye exhibits no discharge. No scleral icterus.  Neck: Normal range of motion. Neck supple. No JVD present. No tracheal deviation present. No thyromegaly present.  Cardiovascular: Normal rate, regular rhythm, normal heart sounds and intact distal pulses.  Exam reveals no gallop and no friction rub.   No murmur heard. Pulmonary/Chest: Effort normal and breath sounds normal. No stridor. No respiratory distress. She has no wheezes. She has no rales. She exhibits no tenderness.  Abdominal: Soft. Bowel sounds are normal.  She exhibits no distension and no mass. There is no tenderness. There is no rebound and no guarding.  Musculoskeletal: Normal range of motion. She exhibits no edema or tenderness.  Lymphadenopathy:    She has no cervical adenopathy.  Neurological: She is oriented to person, place, and time.  Skin: Skin is warm and dry. No rash noted. She is not  diaphoretic. No erythema. No pallor.  Psychiatric: She has a normal mood and affect. Her behavior is normal. Judgment and thought content normal.  Vitals reviewed.   Lab Results  Component Value Date   WBC 8.5 01/06/2016   HGB 12.9 01/06/2016   HCT 38.6 01/06/2016   PLT 304.0 01/06/2016   GLUCOSE 89 01/06/2016   CHOL 223 (H) 01/06/2016   TRIG 122.0 01/06/2016   HDL 70.40 01/06/2016   LDLDIRECT 106.0 09/03/2007   LDLCALC 129 (H) 01/06/2016   ALT 13 01/06/2016   AST 17 01/06/2016   NA 140 01/06/2016   K 4.4 01/06/2016   CL 104 01/06/2016   CREATININE 0.94 01/06/2016   BUN 21 01/06/2016   CO2 31 01/06/2016   TSH 5.16 (H) 01/06/2016   INR 0.92 06/28/2012    Dg Chest 2 View  Result Date: 06/28/2012 *RADIOLOGY REPORT* Clinical Data: Right shoulder arthroplasty. CHEST - 2 VIEW Comparison: 08/30/2010. Findings: Trachea is midline.  Heart size normal.  Biapical pleural thickening.  Lungs are hyperinflated.  Minimal linear scarring at the lung bases.  Lungs are otherwise clear.  No pleural fluid. Left shoulder arthroplasties incidentally noted. IMPRESSION: COPD without acute finding. Original Report Authenticated By: Lorin Picket, M.D.    Assessment & Plan:   [name] was seen today for annual exam, gastroesophageal reflux and hyperlipidemia.  Diagnoses and all orders for this visit:  Routine general medical examination at a health care facility  Hyperlipidemia with target LDL less than 160- She has achieved her LDL goal and is doing well on the statin. -     Lipid panel; Future -     Comprehensive metabolic panel; Future -     TSH; Future  Gastroesophageal reflux disease without esophagitis- her symptoms are well controlled with a PPI, she has no alarm symptoms or concerns, will continue. -     CBC with Differential/Platelet; Future  Need for prophylactic vaccination and inoculation against influenza -     Flu vaccine HIGH DOSE PF  Need for prophylactic vaccination  against Streptococcus pneumoniae (pneumococcus) -     Pneumococcal polysaccharide vaccine 23-valent greater than or equal to 80yo subcutaneous/IM   I have discontinued Ms. Meyn's ciprofloxacin. I am also having her maintain her travoprost (benzalkonium), OVER THE COUNTER MEDICATION, aspirin EC, omeprazole, beta carotene w/minerals, and timolol.  Meds ordered this encounter  Medications  . timolol (BETIMOL) 0.5 % ophthalmic solution    Sig: 1 drop 2 (two) times daily.   See AVS for instructions about healthy living and anticipatory guidance.  Follow-up: Return if symptoms worsen or fail to improve.  Scarlette Calico, MD

## 2016-01-06 NOTE — Patient Instructions (Signed)
Preventive Care for Adults, Female A healthy lifestyle and preventive care can promote health and wellness. Preventive health guidelines for women include the following key practices.  A routine yearly physical is a good way to check with your health care provider about your health and preventive screening. It is a chance to share any concerns and updates on your health and to receive a thorough exam.  Visit your dentist for a routine exam and preventive care every 6 months. Brush your teeth twice a day and floss once a day. Good oral hygiene prevents tooth decay and gum disease.  The frequency of eye exams is based on your age, health, family medical history, use of contact lenses, and other factors. Follow your health care provider's recommendations for frequency of eye exams.  Eat a healthy diet. Foods like vegetables, fruits, whole grains, low-fat dairy products, and lean protein foods contain the nutrients you need without too many calories. Decrease your intake of foods high in solid fats, added sugars, and salt. Eat the right amount of calories for you.Get information about a proper diet from your health care provider, if necessary.  Regular physical exercise is one of the most important things you can do for your health. Most adults should get at least 150 minutes of moderate-intensity exercise (any activity that increases your heart rate and causes you to sweat) each week. In addition, most adults need muscle-strengthening exercises on 2 or more days a week.  Maintain a healthy weight. The body mass index (BMI) is a screening tool to identify possible weight problems. It provides an estimate of body fat based on height and weight. Your health care provider can find your BMI and can help you achieve or maintain a healthy weight.For adults 20 years and older:  A BMI below 18.5 is considered underweight.  A BMI of 18.5 to 24.9 is normal.  A BMI of 25 to 29.9 is considered overweight.  A  BMI of 30 and above is considered obese.  Maintain normal blood lipids and cholesterol levels by exercising and minimizing your intake of saturated fat. Eat a balanced diet with plenty of fruit and vegetables. Blood tests for lipids and cholesterol should begin at age 45 and be repeated every 5 years. If your lipid or cholesterol levels are high, you are over 50, or you are at high risk for heart disease, you may need your cholesterol levels checked more frequently.Ongoing high lipid and cholesterol levels should be treated with medicines if diet and exercise are not working.  If you smoke, find out from your health care provider how to quit. If you do not use tobacco, do not start.  Lung cancer screening is recommended for adults aged 45-80 years who are at high risk for developing lung cancer because of a history of smoking. A yearly low-dose CT scan of the lungs is recommended for people who have at least a 30-pack-year history of smoking and are a current smoker or have quit within the past 15 years. A pack year of smoking is smoking an average of 1 pack of cigarettes a day for 1 year (for example: 1 pack a day for 30 years or 2 packs a day for 15 years). Yearly screening should continue until the smoker has stopped smoking for at least 15 years. Yearly screening should be stopped for people who develop a health problem that would prevent them from having lung cancer treatment.  If you are pregnant, do not drink alcohol. If you are  breastfeeding, be very cautious about drinking alcohol. If you are not pregnant and choose to drink alcohol, do not have more than 1 drink per day. One drink is considered to be 12 ounces (355 mL) of beer, 5 ounces (148 mL) of wine, or 1.5 ounces (44 mL) of liquor.  Avoid use of street drugs. Do not share needles with anyone. Ask for help if you need support or instructions about stopping the use of drugs.  High blood pressure causes heart disease and increases the risk  of stroke. Your blood pressure should be checked at least every 1 to 2 years. Ongoing high blood pressure should be treated with medicines if weight loss and exercise do not work.  If you are 55-79 years old, ask your health care provider if you should take aspirin to prevent strokes.  Diabetes screening is done by taking a blood sample to check your blood glucose level after you have not eaten for a certain period of time (fasting). If you are not overweight and you do not have risk factors for diabetes, you should be screened once every 3 years starting at age 45. If you are overweight or obese and you are 40-70 years of age, you should be screened for diabetes every year as part of your cardiovascular risk assessment.  Breast cancer screening is essential preventive care for women. You should practice "breast self-awareness." This means understanding the normal appearance and feel of your breasts and may include breast self-examination. Any changes detected, no matter how small, should be reported to a health care provider. Women in their 20s and 30s should have a clinical breast exam (CBE) by a health care provider as part of a regular health exam every 1 to 3 years. After age 40, women should have a CBE every year. Starting at age 40, women should consider having a mammogram (breast X-ray test) every year. Women who have a family history of breast cancer should talk to their health care provider about genetic screening. Women at a high risk of breast cancer should talk to their health care providers about having an MRI and a mammogram every year.  Breast cancer gene (BRCA)-related cancer risk assessment is recommended for women who have family members with BRCA-related cancers. BRCA-related cancers include breast, ovarian, tubal, and peritoneal cancers. Having family members with these cancers may be associated with an increased risk for harmful changes (mutations) in the breast cancer genes BRCA1 and  BRCA2. Results of the assessment will determine the need for genetic counseling and BRCA1 and BRCA2 testing.  Your health care provider may recommend that you be screened regularly for cancer of the pelvic organs (ovaries, uterus, and vagina). This screening involves a pelvic examination, including checking for microscopic changes to the surface of your cervix (Pap test). You may be encouraged to have this screening done every 3 years, beginning at age 21.  For women ages 30-65, health care providers may recommend pelvic exams and Pap testing every 3 years, or they may recommend the Pap and pelvic exam, combined with testing for human papilloma virus (HPV), every 5 years. Some types of HPV increase your risk of cervical cancer. Testing for HPV may also be done on women of any age with unclear Pap test results.  Other health care providers may not recommend any screening for nonpregnant women who are considered low risk for pelvic cancer and who do not have symptoms. Ask your health care provider if a screening pelvic exam is right for   you.  If you have had past treatment for cervical cancer or a condition that could lead to cancer, you need Pap tests and screening for cancer for at least 20 years after your treatment. If Pap tests have been discontinued, your risk factors (such as having a new sexual partner) need to be reassessed to determine if screening should resume. Some women have medical problems that increase the chance of getting cervical cancer. In these cases, your health care provider may recommend more frequent screening and Pap tests.  Colorectal cancer can be detected and often prevented. Most routine colorectal cancer screening begins at the age of 50 years and continues through age 75 years. However, your health care provider may recommend screening at an earlier age if you have risk factors for colon cancer. On a yearly basis, your health care provider may provide home test kits to check  for hidden blood in the stool. Use of a small camera at the end of a tube, to directly examine the colon (sigmoidoscopy or colonoscopy), can detect the earliest forms of colorectal cancer. Talk to your health care provider about this at age 50, when routine screening begins. Direct exam of the colon should be repeated every 5-10 years through age 75 years, unless early forms of precancerous polyps or small growths are found.  People who are at an increased risk for hepatitis B should be screened for this virus. You are considered at high risk for hepatitis B if:  You were born in a country where hepatitis B occurs often. Talk with your health care provider about which countries are considered high risk.  Your parents were born in a high-risk country and you have not received a shot to protect against hepatitis B (hepatitis B vaccine).  You have HIV or AIDS.  You use needles to inject street drugs.  You live with, or have sex with, someone who has hepatitis B.  You get hemodialysis treatment.  You take certain medicines for conditions like cancer, organ transplantation, and autoimmune conditions.  Hepatitis C blood testing is recommended for all people born from 1945 through 1965 and any individual with known risks for hepatitis C.  Practice safe sex. Use condoms and avoid high-risk sexual practices to reduce the spread of sexually transmitted infections (STIs). STIs include gonorrhea, chlamydia, syphilis, trichomonas, herpes, HPV, and human immunodeficiency virus (HIV). Herpes, HIV, and HPV are viral illnesses that have no cure. They can result in disability, cancer, and death.  You should be screened for sexually transmitted illnesses (STIs) including gonorrhea and chlamydia if:  You are sexually active and are younger than 24 years.  You are older than 24 years and your health care provider tells you that you are at risk for this type of infection.  Your sexual activity has changed  since you were last screened and you are at an increased risk for chlamydia or gonorrhea. Ask your health care provider if you are at risk.  If you are at risk of being infected with HIV, it is recommended that you take a prescription medicine daily to prevent HIV infection. This is called preexposure prophylaxis (PrEP). You are considered at risk if:  You are sexually active and do not regularly use condoms or know the HIV status of your partner(s).  You take drugs by injection.  You are sexually active with a partner who has HIV.  Talk with your health care provider about whether you are at high risk of being infected with HIV. If   you choose to begin PrEP, you should first be tested for HIV. You should then be tested every 3 months for as long as you are taking PrEP.  Osteoporosis is a disease in which the bones lose minerals and strength with aging. This can result in serious bone fractures or breaks. The risk of osteoporosis can be identified using a bone density scan. Women ages 67 years and over and women at risk for fractures or osteoporosis should discuss screening with their health care providers. Ask your health care provider whether you should take a calcium supplement or vitamin D to reduce the rate of osteoporosis.  Menopause can be associated with physical symptoms and risks. Hormone replacement therapy is available to decrease symptoms and risks. You should talk to your health care provider about whether hormone replacement therapy is right for you.  Use sunscreen. Apply sunscreen liberally and repeatedly throughout the day. You should seek shade when your shadow is shorter than you. Protect yourself by wearing long sleeves, pants, a wide-brimmed hat, and sunglasses year round, whenever you are outdoors.  Once a month, do a whole body skin exam, using a mirror to look at the skin on your back. Tell your health care provider of new moles, moles that have irregular borders, moles that  are larger than a pencil eraser, or moles that have changed in shape or color.  Stay current with required vaccines (immunizations).  Influenza vaccine. All adults should be immunized every year.  Tetanus, diphtheria, and acellular pertussis (Td, Tdap) vaccine. Pregnant women should receive 1 dose of Tdap vaccine during each pregnancy. The dose should be obtained regardless of the length of time since the last dose. Immunization is preferred during the 27th-36th week of gestation. An adult who has not previously received Tdap or who does not know her vaccine status should receive 1 dose of Tdap. This initial dose should be followed by tetanus and diphtheria toxoids (Td) booster doses every 10 years. Adults with an unknown or incomplete history of completing a 3-dose immunization series with Td-containing vaccines should begin or complete a primary immunization series including a Tdap dose. Adults should receive a Td booster every 10 years.  Varicella vaccine. An adult without evidence of immunity to varicella should receive 2 doses or a second dose if she has previously received 1 dose. Pregnant females who do not have evidence of immunity should receive the first dose after pregnancy. This first dose should be obtained before leaving the health care facility. The second dose should be obtained 4-8 weeks after the first dose.  Human papillomavirus (HPV) vaccine. Females aged 13-26 years who have not received the vaccine previously should obtain the 3-dose series. The vaccine is not recommended for use in pregnant females. However, pregnancy testing is not needed before receiving a dose. If a female is found to be pregnant after receiving a dose, no treatment is needed. In that case, the remaining doses should be delayed until after the pregnancy. Immunization is recommended for any person with an immunocompromised condition through the age of 61 years if she did not get any or all doses earlier. During the  3-dose series, the second dose should be obtained 4-8 weeks after the first dose. The third dose should be obtained 24 weeks after the first dose and 16 weeks after the second dose.  Zoster vaccine. One dose is recommended for adults aged 30 years or older unless certain conditions are present.  Measles, mumps, and rubella (MMR) vaccine. Adults born  before 1957 generally are considered immune to measles and mumps. Adults born in 1957 or later should have 1 or more doses of MMR vaccine unless there is a contraindication to the vaccine or there is laboratory evidence of immunity to each of the three diseases. A routine second dose of MMR vaccine should be obtained at least 28 days after the first dose for students attending postsecondary schools, health care workers, or international travelers. People who received inactivated measles vaccine or an unknown type of measles vaccine during 1963-1967 should receive 2 doses of MMR vaccine. People who received inactivated mumps vaccine or an unknown type of mumps vaccine before 1979 and are at high risk for mumps infection should consider immunization with 2 doses of MMR vaccine. For females of childbearing age, rubella immunity should be determined. If there is no evidence of immunity, females who are not pregnant should be vaccinated. If there is no evidence of immunity, females who are pregnant should delay immunization until after pregnancy. Unvaccinated health care workers born before 1957 who lack laboratory evidence of measles, mumps, or rubella immunity or laboratory confirmation of disease should consider measles and mumps immunization with 2 doses of MMR vaccine or rubella immunization with 1 dose of MMR vaccine.  Pneumococcal 13-valent conjugate (PCV13) vaccine. When indicated, a person who is uncertain of his immunization history and has no record of immunization should receive the PCV13 vaccine. All adults 65 years of age and older should receive this  vaccine. An adult aged 19 years or older who has certain medical conditions and has not been previously immunized should receive 1 dose of PCV13 vaccine. This PCV13 should be followed with a dose of pneumococcal polysaccharide (PPSV23) vaccine. Adults who are at high risk for pneumococcal disease should obtain the PPSV23 vaccine at least 8 weeks after the dose of PCV13 vaccine. Adults older than 80 years of age who have normal immune system function should obtain the PPSV23 vaccine dose at least 1 year after the dose of PCV13 vaccine.  Pneumococcal polysaccharide (PPSV23) vaccine. When PCV13 is also indicated, PCV13 should be obtained first. All adults aged 65 years and older should be immunized. An adult younger than age 65 years who has certain medical conditions should be immunized. Any person who resides in a nursing home or long-term care facility should be immunized. An adult smoker should be immunized. People with an immunocompromised condition and certain other conditions should receive both PCV13 and PPSV23 vaccines. People with human immunodeficiency virus (HIV) infection should be immunized as soon as possible after diagnosis. Immunization during chemotherapy or radiation therapy should be avoided. Routine use of PPSV23 vaccine is not recommended for American Indians, [place] Natives, or people younger than 65 years unless there are medical conditions that require PPSV23 vaccine. When indicated, people who have unknown immunization and have no record of immunization should receive PPSV23 vaccine. One-time revaccination 5 years after the first dose of PPSV23 is recommended for people aged 19-64 years who have chronic kidney failure, nephrotic syndrome, asplenia, or immunocompromised conditions. People who received 1-2 doses of PPSV23 before age 65 years should receive another dose of PPSV23 vaccine at age 65 years or later if at least 5 years have passed since the previous dose. Doses of PPSV23 are not  needed for people immunized with PPSV23 at or after age 65 years.  Meningococcal vaccine. Adults with asplenia or persistent complement component deficiencies should receive 2 doses of quadrivalent meningococcal conjugate (MenACWY-D) vaccine. The doses should be obtained   at least 2 months apart. Microbiologists working with certain meningococcal bacteria, [place] recruits, people at risk during an outbreak, and people who travel to or live in countries with a high rate of meningitis should be immunized. A first-year college student up through age 34 years who is living in a residence hall should receive a dose if she did not receive a dose on or after her 16th birthday. Adults who have certain high-risk conditions should receive one or more doses of vaccine.  Hepatitis A vaccine. Adults who wish to be protected from this disease, have certain high-risk conditions, work with hepatitis A-infected animals, work in hepatitis A research labs, or travel to or work in countries with a high rate of hepatitis A should be immunized. Adults who were previously unvaccinated and who anticipate close contact with an international adoptee during the first 60 days after arrival in the [place] from a country with a high rate of hepatitis A should be immunized.  Hepatitis B vaccine. Adults who wish to be protected from this disease, have certain high-risk conditions, may be exposed to blood or other infectious body fluids, are household contacts or sex partners of hepatitis B positive people, are clients or workers in certain care facilities, or travel to or work in countries with a high rate of hepatitis B should be immunized.  Haemophilus influenzae type b (Hib) vaccine. A previously unvaccinated person with asplenia or sickle cell disease or having a scheduled splenectomy should receive 1 dose of Hib vaccine. Regardless of previous immunization, a recipient of a hematopoietic stem cell transplant should receive a  3-dose series 6-12 months after her successful transplant. Hib vaccine is not recommended for adults with HIV infection. Preventive Services / Frequency Ages 35 to 4 years  Blood pressure check.** / Every 3-5 years.  Lipid and cholesterol check.** / Every 5 years beginning at age 60.  Clinical breast exam.** / Every 3 years for women in their 71s and 10s.  BRCA-related cancer risk assessment.** / For women who have family members with a BRCA-related cancer (breast, ovarian, tubal, or peritoneal cancers).  Pap test.** / Every 2 years from ages 76 through 26. Every 3 years starting at age 61 through age 76 or 93 with a history of 3 consecutive normal Pap tests.  HPV screening.** / Every 3 years from ages 37 through ages 60 to 51 with a history of 3 consecutive normal Pap tests.  Hepatitis C blood test.** / For any individual with known risks for hepatitis C.  Skin self-exam. / Monthly.  Influenza vaccine. / Every year.  Tetanus, diphtheria, and acellular pertussis (Tdap, Td) vaccine.** / Consult your health care provider. Pregnant women should receive 1 dose of Tdap vaccine during each pregnancy. 1 dose of Td every 10 years.  Varicella vaccine.** / Consult your health care provider. Pregnant females who do not have evidence of immunity should receive the first dose after pregnancy.  HPV vaccine. / 3 doses over 6 months, if 93 and younger. The vaccine is not recommended for use in pregnant females. However, pregnancy testing is not needed before receiving a dose.  Measles, mumps, rubella (MMR) vaccine.** / You need at least 1 dose of MMR if you were born in 1957 or later. You may also need a 2nd dose. For females of childbearing age, rubella immunity should be determined. If there is no evidence of immunity, females who are not pregnant should be vaccinated. If there is no evidence of immunity, females who are  pregnant should delay immunization until after pregnancy.  Pneumococcal  13-valent conjugate (PCV13) vaccine.** / Consult your health care provider.  Pneumococcal polysaccharide (PPSV23) vaccine.** / 1 to 2 doses if you smoke cigarettes or if you have certain conditions.  Meningococcal vaccine.** / 1 dose if you are age 68 to 8 years and a Market researcher living in a residence hall, or have one of several medical conditions, you need to get vaccinated against meningococcal disease. You may also need additional booster doses.  Hepatitis A vaccine.** / Consult your health care provider.  Hepatitis B vaccine.** / Consult your health care provider.  Haemophilus influenzae type b (Hib) vaccine.** / Consult your health care provider. Ages 7 to 53 years  Blood pressure check.** / Every year.  Lipid and cholesterol check.** / Every 5 years beginning at age 25 years.  Lung cancer screening. / Every year if you are aged 11-80 years and have a 30-pack-year history of smoking and currently smoke or have quit within the past 15 years. Yearly screening is stopped once you have quit smoking for at least 15 years or develop a health problem that would prevent you from having lung cancer treatment.  Clinical breast exam.** / Every year after age 48 years.  BRCA-related cancer risk assessment.** / For women who have family members with a BRCA-related cancer (breast, ovarian, tubal, or peritoneal cancers).  Mammogram.** / Every year beginning at age 41 years and continuing for as long as you are in good health. Consult with your health care provider.  Pap test.** / Every 3 years starting at age 65 years through age 37 or 70 years with a history of 3 consecutive normal Pap tests.  HPV screening.** / Every 3 years from ages 72 years through ages 60 to 40 years with a history of 3 consecutive normal Pap tests.  Fecal occult blood test (FOBT) of stool. / Every year beginning at age 21 years and continuing until age 5 years. You may not need to do this test if you get  a colonoscopy every 10 years.  Flexible sigmoidoscopy or colonoscopy.** / Every 5 years for a flexible sigmoidoscopy or every 10 years for a colonoscopy beginning at age 35 years and continuing until age 48 years.  Hepatitis C blood test.** / For all people born from [birth year] through 1965 and any individual with known risks for hepatitis C.  Skin self-exam. / Monthly.  Influenza vaccine. / Every year.  Tetanus, diphtheria, and acellular pertussis (Tdap/Td) vaccine.** / Consult your health care provider. Pregnant women should receive 1 dose of Tdap vaccine during each pregnancy. 1 dose of Td every 10 years.  Varicella vaccine.** / Consult your health care provider. Pregnant females who do not have evidence of immunity should receive the first dose after pregnancy.  Zoster vaccine.** / 1 dose for adults aged 30 years or older.  Measles, mumps, rubella (MMR) vaccine.** / You need at least 1 dose of MMR if you were born in 1957 or later. You may also need a second dose. For females of childbearing age, rubella immunity should be determined. If there is no evidence of immunity, females who are not pregnant should be vaccinated. If there is no evidence of immunity, females who are pregnant should delay immunization until after pregnancy.  Pneumococcal 13-valent conjugate (PCV13) vaccine.** / Consult your health care provider.  Pneumococcal polysaccharide (PPSV23) vaccine.** / 1 to 2 doses if you smoke cigarettes or if you have certain conditions.  Meningococcal vaccine.** /  Consult your health care provider.  Hepatitis A vaccine.** / Consult your health care provider.  Hepatitis B vaccine.** / Consult your health care provider.  Haemophilus influenzae type b (Hib) vaccine.** / Consult your health care provider. Ages 64 years and over  Blood pressure check.** / Every year.  Lipid and cholesterol check.** / Every 5 years beginning at age 23 years.  Lung cancer screening. / Every year if you  are aged 16-80 years and have a 30-pack-year history of smoking and currently smoke or have quit within the past 15 years. Yearly screening is stopped once you have quit smoking for at least 15 years or develop a health problem that would prevent you from having lung cancer treatment.  Clinical breast exam.** / Every year after age 74 years.  BRCA-related cancer risk assessment.** / For women who have family members with a BRCA-related cancer (breast, ovarian, tubal, or peritoneal cancers).  Mammogram.** / Every year beginning at age 44 years and continuing for as long as you are in good health. Consult with your health care provider.  Pap test.** / Every 3 years starting at age 58 years through age 22 or 39 years with 3 consecutive normal Pap tests. Testing can be stopped between 65 and 70 years with 3 consecutive normal Pap tests and no abnormal Pap or HPV tests in the past 10 years.  HPV screening.** / Every 3 years from ages 64 years through ages 70 or 61 years with a history of 3 consecutive normal Pap tests. Testing can be stopped between 65 and 70 years with 3 consecutive normal Pap tests and no abnormal Pap or HPV tests in the past 10 years.  Fecal occult blood test (FOBT) of stool. / Every year beginning at age 40 years and continuing until age 27 years. You may not need to do this test if you get a colonoscopy every 10 years.  Flexible sigmoidoscopy or colonoscopy.** / Every 5 years for a flexible sigmoidoscopy or every 10 years for a colonoscopy beginning at age 7 years and continuing until age 32 years.  Hepatitis C blood test.** / For all people born from [birth year] through 1963 and any individual with known risks for hepatitis C.  Osteoporosis screening.** / A one-time screening for women ages 30 years and over and women at risk for fractures or osteoporosis.  Skin self-exam. / Monthly.  Influenza vaccine. / Every year.  Tetanus, diphtheria, and acellular pertussis (Tdap/Td)  vaccine.** / 1 dose of Td every 10 years.  Varicella vaccine.** / Consult your health care provider.  Zoster vaccine.** / 1 dose for adults aged 35 years or older.  Pneumococcal 13-valent conjugate (PCV13) vaccine.** / Consult your health care provider.  Pneumococcal polysaccharide (PPSV23) vaccine.** / 1 dose for all adults aged 46 years and older.  Meningococcal vaccine.** / Consult your health care provider.  Hepatitis A vaccine.** / Consult your health care provider.  Hepatitis B vaccine.** / Consult your health care provider.  Haemophilus influenzae type b (Hib) vaccine.** / Consult your health care provider. ** Family history and personal history of risk and conditions may change your health care provider's recommendations.   This information is not intended to replace advice given to you by your health care provider. Make sure you discuss any questions you have with your health care provider.   Document Released: 06/07/2001 Document Revised: 05/02/2014 Document Reviewed: 09/06/2010 Elsevier Interactive Patient Education [employer].

## 2016-01-07 NOTE — Assessment & Plan Note (Signed)

## 2016-01-09 ENCOUNTER — Telehealth: Payer: Self-pay

## 2016-01-09 NOTE — Telephone Encounter (Signed)
Pt requested a form to be filled out. Pt had recent visit. Form is completed, pt informed and form has been mailed. Copy to chart.

## 2016-01-11 DIAGNOSIS — H401123 Primary open-angle glaucoma, left eye, severe stage: Secondary | ICD-10-CM | POA: Diagnosis not present

## 2016-01-11 DIAGNOSIS — H40013 Open angle with borderline findings, low risk, bilateral: Secondary | ICD-10-CM | POA: Diagnosis not present

## 2016-01-27 DIAGNOSIS — H401123 Primary open-angle glaucoma, left eye, severe stage: Secondary | ICD-10-CM | POA: Diagnosis not present

## 2016-02-05 DIAGNOSIS — H401113 Primary open-angle glaucoma, right eye, severe stage: Secondary | ICD-10-CM | POA: Diagnosis not present

## 2016-02-05 DIAGNOSIS — H04123 Dry eye syndrome of bilateral lacrimal glands: Secondary | ICD-10-CM | POA: Diagnosis not present

## 2016-02-05 DIAGNOSIS — H401123 Primary open-angle glaucoma, left eye, severe stage: Secondary | ICD-10-CM | POA: Diagnosis not present

## 2016-02-06 ENCOUNTER — Inpatient Hospital Stay (HOSPITAL_COMMUNITY)
Admission: EM | Admit: 2016-02-06 | Discharge: 2016-02-10 | DRG: 090 | Disposition: A | Discharge status: Skilled Nursing Facility | Payer: Medicare Other | Attending: Internal Medicine | Admitting: Internal Medicine

## 2016-02-06 ENCOUNTER — Encounter (HOSPITAL_COMMUNITY): Payer: Self-pay | Admitting: Emergency Medicine

## 2016-02-06 ENCOUNTER — Emergency Department (HOSPITAL_COMMUNITY): Payer: Medicare Other

## 2016-02-06 DIAGNOSIS — S060X0A Concussion without loss of consciousness, initial encounter: Secondary | ICD-10-CM | POA: Diagnosis not present

## 2016-02-06 DIAGNOSIS — M150 Primary generalized (osteo)arthritis: Secondary | ICD-10-CM | POA: Diagnosis not present

## 2016-02-06 DIAGNOSIS — Z66 Do not resuscitate: Secondary | ICD-10-CM | POA: Diagnosis present

## 2016-02-06 DIAGNOSIS — D72829 Elevated white blood cell count, unspecified: Secondary | ICD-10-CM | POA: Diagnosis not present

## 2016-02-06 DIAGNOSIS — G8929 Other chronic pain: Secondary | ICD-10-CM | POA: Diagnosis present

## 2016-02-06 DIAGNOSIS — S199XXA Unspecified injury of neck, initial encounter: Secondary | ICD-10-CM | POA: Diagnosis not present

## 2016-02-06 DIAGNOSIS — S7002XA Contusion of left hip, initial encounter: Secondary | ICD-10-CM | POA: Diagnosis not present

## 2016-02-06 DIAGNOSIS — Z87891 Personal history of nicotine dependence: Secondary | ICD-10-CM

## 2016-02-06 DIAGNOSIS — S060XAA Concussion with loss of consciousness status unknown, initial encounter: Secondary | ICD-10-CM | POA: Diagnosis present

## 2016-02-06 DIAGNOSIS — M419 Scoliosis, unspecified: Secondary | ICD-10-CM | POA: Diagnosis present

## 2016-02-06 DIAGNOSIS — S0990XA Unspecified injury of head, initial encounter: Secondary | ICD-10-CM

## 2016-02-06 DIAGNOSIS — M1990 Unspecified osteoarthritis, unspecified site: Secondary | ICD-10-CM | POA: Diagnosis present

## 2016-02-06 DIAGNOSIS — W19XXXA Unspecified fall, initial encounter: Secondary | ICD-10-CM

## 2016-02-06 DIAGNOSIS — S0003XA Contusion of scalp, initial encounter: Secondary | ICD-10-CM | POA: Diagnosis not present

## 2016-02-06 DIAGNOSIS — Y92008 Other place in unspecified non-institutional (private) residence as the place of occurrence of the external cause: Secondary | ICD-10-CM

## 2016-02-06 DIAGNOSIS — W108XXA Fall (on) (from) other stairs and steps, initial encounter: Secondary | ICD-10-CM | POA: Diagnosis present

## 2016-02-06 DIAGNOSIS — J449 Chronic obstructive pulmonary disease, unspecified: Secondary | ICD-10-CM | POA: Diagnosis not present

## 2016-02-06 DIAGNOSIS — R262 Difficulty in walking, not elsewhere classified: Secondary | ICD-10-CM

## 2016-02-06 DIAGNOSIS — G4489 Other headache syndrome: Secondary | ICD-10-CM | POA: Diagnosis not present

## 2016-02-06 DIAGNOSIS — Z90710 Acquired absence of both cervix and uterus: Secondary | ICD-10-CM

## 2016-02-06 DIAGNOSIS — M549 Dorsalgia, unspecified: Secondary | ICD-10-CM | POA: Diagnosis present

## 2016-02-06 DIAGNOSIS — Z79899 Other long term (current) drug therapy: Secondary | ICD-10-CM

## 2016-02-06 DIAGNOSIS — R42 Dizziness and giddiness: Secondary | ICD-10-CM | POA: Diagnosis present

## 2016-02-06 DIAGNOSIS — T07XXXA Unspecified multiple injuries, initial encounter: Secondary | ICD-10-CM

## 2016-02-06 DIAGNOSIS — R030 Elevated blood-pressure reading, without diagnosis of hypertension: Secondary | ICD-10-CM | POA: Diagnosis present

## 2016-02-06 DIAGNOSIS — M542 Cervicalgia: Secondary | ICD-10-CM | POA: Diagnosis not present

## 2016-02-06 DIAGNOSIS — F439 Reaction to severe stress, unspecified: Secondary | ICD-10-CM | POA: Diagnosis present

## 2016-02-06 DIAGNOSIS — Z96619 Presence of unspecified artificial shoulder joint: Secondary | ICD-10-CM | POA: Diagnosis present

## 2016-02-06 DIAGNOSIS — S060X9A Concussion with loss of consciousness of unspecified duration, initial encounter: Secondary | ICD-10-CM | POA: Diagnosis present

## 2016-02-06 DIAGNOSIS — S098XXA Other specified injuries of head, initial encounter: Secondary | ICD-10-CM | POA: Diagnosis not present

## 2016-02-06 DIAGNOSIS — Z7982 Long term (current) use of aspirin: Secondary | ICD-10-CM

## 2016-02-06 DIAGNOSIS — Z9049 Acquired absence of other specified parts of digestive tract: Secondary | ICD-10-CM

## 2016-02-06 DIAGNOSIS — Z96641 Presence of right artificial hip joint: Secondary | ICD-10-CM | POA: Diagnosis present

## 2016-02-06 LAB — CBC WITH DIFFERENTIAL/PLATELET
Basophils Absolute: 0.1 10*3/uL (ref 0.0–0.1)
Basophils Relative: 0 %
EOS ABS: 0.1 10*3/uL (ref 0.0–0.7)
Eosinophils Relative: 1 %
HEMATOCRIT: 38.4 % (ref 36.0–46.0)
HEMOGLOBIN: 12.5 g/dL (ref 12.0–15.0)
LYMPHS ABS: 1.7 10*3/uL (ref 0.7–4.0)
LYMPHS PCT: 8 %
MCH: 28.9 pg (ref 26.0–34.0)
MCHC: 32.6 g/dL (ref 30.0–36.0)
MCV: 88.7 fL (ref 78.0–100.0)
MONOS PCT: 5 %
Monocytes Absolute: 1.1 10*3/uL — ABNORMAL HIGH (ref 0.1–1.0)
NEUTROS ABS: 17.2 10*3/uL — AB (ref 1.7–7.7)
NEUTROS PCT: 86 %
Platelets: 286 10*3/uL (ref 150–400)
RBC: 4.33 MIL/uL (ref 3.87–5.11)
RDW: 13.7 % (ref 11.5–15.5)
WBC: 20.2 10*3/uL — AB (ref 4.0–10.5)

## 2016-02-06 LAB — URINALYSIS, ROUTINE W REFLEX MICROSCOPIC
BILIRUBIN URINE: NEGATIVE
GLUCOSE, UA: NEGATIVE mg/dL
Hgb urine dipstick: NEGATIVE
KETONES UR: NEGATIVE mg/dL
LEUKOCYTES UA: NEGATIVE
Nitrite: NEGATIVE
PH: 6.5 (ref 5.0–8.0)
PROTEIN: NEGATIVE mg/dL
Specific Gravity, Urine: 1.02 (ref 1.005–1.030)

## 2016-02-06 LAB — COMPREHENSIVE METABOLIC PANEL
ALK PHOS: 57 U/L (ref 38–126)
ALT: 18 U/L (ref 14–54)
ANION GAP: 8 (ref 5–15)
AST: 27 U/L (ref 15–41)
Albumin: 3.8 g/dL (ref 3.5–5.0)
BILIRUBIN TOTAL: 0.5 mg/dL (ref 0.3–1.2)
BUN: 16 mg/dL (ref 6–20)
CALCIUM: 9.7 mg/dL (ref 8.9–10.3)
CO2: 22 mmol/L (ref 22–32)
CREATININE: 0.85 mg/dL (ref 0.44–1.00)
Chloride: 109 mmol/L (ref 101–111)
GFR calc non Af Amer: 59 mL/min — ABNORMAL LOW (ref >60)
GLUCOSE: 102 mg/dL — AB (ref 65–99)
Potassium: 3.5 mmol/L (ref 3.5–5.1)
SODIUM: 139 mmol/L (ref 135–145)
TOTAL PROTEIN: 7.2 g/dL (ref 6.5–8.1)

## 2016-02-06 LAB — CBG MONITORING, ED: Glucose-Capillary: 113 mg/dL — ABNORMAL HIGH (ref 65–99)

## 2016-02-06 MED ORDER — MORPHINE SULFATE (PF) 2 MG/ML IV SOLN
2.0000 mg | INTRAVENOUS | Status: DC | PRN
  Administered 2016-02-06: 4 mg via INTRAVENOUS
  Administered 2016-02-07: 2 mg via INTRAVENOUS
  Administered 2016-02-08 (×2): 4 mg via INTRAVENOUS
  Filled 2016-02-06 (×3): qty 2
  Filled 2016-02-06: qty 1

## 2016-02-06 MED ORDER — SODIUM CHLORIDE 0.9 % IV BOLUS (SEPSIS)
500.0000 mL | Freq: Once | INTRAVENOUS | Status: AC
  Administered 2016-02-06: 500 mL via INTRAVENOUS

## 2016-02-06 MED ORDER — FENTANYL CITRATE (PF) 100 MCG/2ML IJ SOLN
50.0000 ug | Freq: Once | INTRAMUSCULAR | Status: AC
  Administered 2016-02-06: 50 ug via NASAL
  Filled 2016-02-06: qty 2

## 2016-02-06 MED ORDER — SODIUM CHLORIDE 0.9 % IV SOLN
INTRAVENOUS | Status: DC
  Administered 2016-02-06: 125 mL/h via INTRAVENOUS

## 2016-02-06 MED ORDER — PANTOPRAZOLE SODIUM 40 MG PO TBEC
40.0000 mg | DELAYED_RELEASE_TABLET | Freq: Every day | ORAL | Status: DC
  Administered 2016-02-06 – 2016-02-10 (×5): 40 mg via ORAL
  Filled 2016-02-06 (×6): qty 1

## 2016-02-06 MED ORDER — ONDANSETRON HCL 4 MG/2ML IJ SOLN
4.0000 mg | Freq: Once | INTRAMUSCULAR | Status: AC
  Administered 2016-02-06: 4 mg via INTRAVENOUS
  Filled 2016-02-06: qty 2

## 2016-02-06 MED ORDER — ONDANSETRON 8 MG PO TBDP
8.0000 mg | ORAL_TABLET | Freq: Once | ORAL | Status: AC
  Administered 2016-02-06: 8 mg via ORAL
  Filled 2016-02-06: qty 1

## 2016-02-06 NOTE — ED Notes (Signed)
Patient reports she does not remember how she fell, but does remember falling backwards and hitting her head on the concrete floor of basement.

## 2016-02-06 NOTE — ED Notes (Signed)
Patient aware that we need a urine sample, and has been put on the bedpan but could not urinate.  Also, she is very dizzy and nauseated with movement.

## 2016-02-06 NOTE — ED Notes (Signed)
Pt was able to eat and drink with no difficulty. When the Pt attempted to ambulate she was at a sitting position on bed and complained of being dizzy and nauseous and would not attempt to stand.

## 2016-02-06 NOTE — ED Notes (Signed)
Unable to collect labs at this time staff working with patient

## 2016-02-06 NOTE — ED Provider Notes (Signed)
[facility] DEPT Provider Note   CSN: [id number] Arrival date & time: 02/06/16  1350     History   Chief Complaint Chief Complaint  Patient presents with  . Fall    HPI [name] is a 82 y.o. female.  Patient was walking up steps, from her basement carrying a box when she fell backwards striking her head. She feels like she may been 6 steps up, when she fell. She injured her left hip, and the back of her head in the fall. She does not believe that she lost consciousness. She was unable to get up from the floor secondary to pain and dizziness, but only had to wait about 10 minutes for help. Her caregiver arrived and called an ambulance. She ate breakfast this morning, but has not had lunch yet. She denies recent fever, chills, nausea, vomiting, cough, chest pain, or other episodes of dizziness. There are no other known modifying factors.  HPI  Past Medical History:  Diagnosis Date  . Adenomatous colon polyp 04/26/95   tubulovillous  . Allergy   . Arthritis   . Constipation   . COPD, moderate ([place])   . Degenerative joint disease   . Depression   . Gastritis   . GERD (gastroesophageal reflux disease)   . Glaucoma   . Insomnia   . Lack of bladder control   . Macular degeneration   . OA (osteoarthritis)   . Renal cyst    right  . Shortness of breath   . Small bowel obstruction   . Trigeminal neuralgia     Patient Active Problem List   Diagnosis Date Noted  . Hyperlipidemia with target LDL less than 160 09/24/2014  . Routine general medical examination at a health care facility 09/24/2014  . MACULAR DEGENERATION 08/07/2008  . GLAUCOMA 08/07/2008  . INSOMNIA 08/07/2008  . Constipation 07/22/2008  . NEURALGIA, TRIGEMINAL 01/07/2008  . Allergic rhinitis 09/03/2007  . COPD 09/03/2007  . GERD 09/03/2007  . Osteoarthritis 09/03/2007    Past Surgical History:  Procedure Laterality Date  . ABDOMINAL HYSTERECTOMY     nonmalignant reasons  . BACK SURGERY   05/20/08  . CATARACT EXTRACTION     left  . CHOLECYSTECTOMY    . DILATION AND CURETTAGE OF UTERUS    . JOINT REPLACEMENT     right hip  . PARTIAL COLECTOMY  04/26/95   tubulovillous adenoma  . SHOULDER SURGERY  12/24/08   left  . TONSILLECTOMY    . TOTAL SHOULDER ARTHROPLASTY Right 07/05/2012   Procedure: RIGHT TOTAL SHOULDER ARTHROPLASTY;  Surgeon: Marin Shutter, MD;  Location: [facility];  Service: Orthopedics;  Laterality: Right;  Right total shoulder arthroplasty    OB History    No data available       Home Medications    Prior to Admission medications   Medication Sig Start Date End Date Taking? Authorizing Provider  aspirin EC 81 MG tablet Take 81 mg by mouth daily.    Historical Provider, MD  beta carotene w/minerals (OCUVITE) tablet Take 1 tablet by mouth 2 (two) times daily.    Historical Provider, MD  ketorolac (ACULAR) 0.5 % ophthalmic solution Place 1 drop into both eyes 4 (four) times daily.  01/27/16   Historical Provider, MD  morphine (MSIR) 15 MG tablet Take 15 mg by mouth every 6 (six) hours as needed for moderate pain or severe pain.  01/13/16   Historical Provider, MD  omeprazole (PRILOSEC) 20 MG capsule Take 20 mg  by mouth daily.    Historical Provider, MD  timolol (BETIMOL) 0.5 % ophthalmic solution Place 1 drop into both eyes 2 (two) times daily.     Historical Provider, MD  travoprost, benzalkonium, (TRAVATAN) 0.004 % ophthalmic solution Place 1 drop into both eyes at bedtime.     Historical Provider, MD    Family History Family History  Problem Relation Age of Onset  . Cancer Father     colon  . Heart disease Sister   . Colon polyps Brother   . Diabetes Brother     Social History Social History  Substance Use Topics  . Smoking status: Former Smoker    Packs/day: 1.00    Years: 50.00    Types: Cigarettes    Quit date: 06/28/2005  . Smokeless tobacco: Not on file  . Alcohol use No     Allergies   Codeine; Pregabalin; Promethazine hcl; and  Sulfonamide derivatives   Review of Systems Review of Systems  All other systems reviewed and are negative.    Physical Exam Updated Vital Signs BP (!) 135/46 (BP Location: Right Arm)   Pulse 67   Temp 98.3 F (36.8 C) (Oral)   Resp 18   SpO2 100%   Physical Exam  Constitutional: She is oriented to person, place, and time. She appears well-developed. She appears distressed (Uncomfortable).  Elderly, frail  HENT:  Head: Normocephalic.  Large right occipital parietal contusion with small overlying abrasion. No associated crepitation.  Eyes: Conjunctivae and EOM are normal. Pupils are equal, round, and reactive to light.  Neck: Normal range of motion and phonation normal. Neck supple.  Cardiovascular: Normal rate and regular rhythm.   Pulmonary/Chest: Effort normal and breath sounds normal. No respiratory distress. She exhibits no tenderness.  Abdominal: Soft. She exhibits no distension. There is no tenderness. There is no guarding.  Musculoskeletal:  No tenderness to palpation of the cervical, thoracic or lumbar spine. Mild paralumbar tenderness bilaterally. No contusion of the back. Normal passive range of motion of the hips bilaterally. No knee, ankle, foot deformity.  Neurological: She is alert and oriented to person, place, and time. She exhibits normal muscle tone.  Skin: Skin is warm and dry.  Psychiatric: She has a normal mood and affect. Her behavior is normal.  Nursing note and vitals reviewed.    ED Treatments / Results  Labs (all labs ordered are listed, but only abnormal results are displayed) Labs Reviewed  CBC WITH DIFFERENTIAL/PLATELET - Abnormal; Notable for the following:       Result Value   WBC 20.2 (*)    Neutro Abs 17.2 (*)    Monocytes Absolute 1.1 (*)    All other components within normal limits  COMPREHENSIVE METABOLIC PANEL - Abnormal; Notable for the following:    Glucose, Bld 102 (*)    GFR calc non Af Amer 59 (*)    All other components  within normal limits  CBG MONITORING, ED - Abnormal; Notable for the following:    Glucose-Capillary 113 (*)    All other components within normal limits  URINALYSIS, ROUTINE W REFLEX MICROSCOPIC (NOT AT [facility])  POCT CBG (FASTING - GLUCOSE)-MANUAL ENTRY    EKG  EKG Interpretation  Date/Time:  Saturday February 06 2016 15:30:09 EDT Ventricular Rate:  68 PR Interval:    QRS Duration: 106 QT Interval:  441 QTC Calculation: 469 R Axis:   82 Text Interpretation:  Sinus rhythm Anteroseptal infarct, age indeterminate since last tracing no significant change Confirmed by  Ryker.Hammer  MD, Vira Agar [phone]) on 02/06/2016 3:38:27 PM       Radiology Ct Head Wo Contrast  Result Date: 02/06/2016 CLINICAL DATA:  82 year old female with headache and neck pain following fall and head injury today. EXAM: CT HEAD WITHOUT CONTRAST CT CERVICAL SPINE WITHOUT CONTRAST TECHNIQUE: Multidetector CT imaging of the head and cervical spine was performed following the standard protocol without intravenous contrast. Multiplanar CT image reconstructions of the cervical spine were also generated. COMPARISON:  10/11/2005 cervical spine radiographs FINDINGS: CT HEAD FINDINGS Brain: No evidence of acute infarction, hemorrhage, hydrocephalus, extra-axial collection or mass lesion/mass effect. Atrophy and chronic small-vessel white matter ischemic changes noted. Vascular: Intracranial vascular calcifications noted. Skull: Normal. Negative for fracture or focal lesion. Sinuses/Orbits: No acute finding. Other: A posterior scalp hematoma is noted. CT CERVICAL SPINE FINDINGS No acute fracture, acute subluxation or prevertebral soft tissue swelling identified. Moderate multilevel degenerative disc disease and spondylosis noted. No suspicious focal bony lesions are identified. No soft tissue abnormalities are identified. The lung apices are clear. IMPRESSION: No evidence of acute intracranial abnormality. Atrophy and chronic small-vessel  white matter ischemic changes. Posterior scalp hematoma without underlying fracture. No static evidence of acute injury to the cervical spine. Moderate multilevel degenerative changes. Electronically Signed   By: Margarette Canada M.D.   On: 02/06/2016 16:34   Ct Cervical Spine Wo Contrast  Result Date: 02/06/2016 CLINICAL DATA:  82 year old female with headache and neck pain following fall and head injury today. EXAM: CT HEAD WITHOUT CONTRAST CT CERVICAL SPINE WITHOUT CONTRAST TECHNIQUE: Multidetector CT imaging of the head and cervical spine was performed following the standard protocol without intravenous contrast. Multiplanar CT image reconstructions of the cervical spine were also generated. COMPARISON:  10/11/2005 cervical spine radiographs FINDINGS: CT HEAD FINDINGS Brain: No evidence of acute infarction, hemorrhage, hydrocephalus, extra-axial collection or mass lesion/mass effect. Atrophy and chronic small-vessel white matter ischemic changes noted. Vascular: Intracranial vascular calcifications noted. Skull: Normal. Negative for fracture or focal lesion. Sinuses/Orbits: No acute finding. Other: A posterior scalp hematoma is noted. CT CERVICAL SPINE FINDINGS No acute fracture, acute subluxation or prevertebral soft tissue swelling identified. Moderate multilevel degenerative disc disease and spondylosis noted. No suspicious focal bony lesions are identified. No soft tissue abnormalities are identified. The lung apices are clear. IMPRESSION: No evidence of acute intracranial abnormality. Atrophy and chronic small-vessel white matter ischemic changes. Posterior scalp hematoma without underlying fracture. No static evidence of acute injury to the cervical spine. Moderate multilevel degenerative changes. Electronically Signed   By: Margarette Canada M.D.   On: 02/06/2016 16:34    Procedures Procedures (including critical care time)  Medications Ordered in ED Medications  sodium chloride 0.9 % bolus 500 mL (not  administered)  ondansetron (ZOFRAN) injection 4 mg (not administered)  0.9 %  sodium chloride infusion (not administered)  ondansetron (ZOFRAN-ODT) disintegrating tablet 8 mg (8 mg Oral Given 02/06/16 1555)  fentaNYL (SUBLIMAZE) injection 50 mcg (50 mcg Nasal Given 02/06/16 1556)     Initial Impression / Assessment and Plan / ED Course  I have reviewed the triage vital signs and the nursing notes.  Pertinent labs & imaging results that were available during my care of the patient were reviewed by me and considered in my medical decision making (see chart for details).  Clinical Course  Value Comment By Time   She complains of mild dizziness and is willing to try ambulating. Daleen Bo, MD 10/14 1902   Ambulation trial, per nursing- during the attempt  to sit up, the patient began to have extreme nausea and had to lie down. Following that her head started hurting again. She has been unable to tolerate food or fluids, without nausea during the stay here. Daleen Bo, MD 10/14 2148  Sodium: 139 (Reviewed) Daleen Bo, MD 10/14 2153    Medications  sodium chloride 0.9 % bolus 500 mL (not administered)  ondansetron (ZOFRAN) injection 4 mg (not administered)  0.9 %  sodium chloride infusion (not administered)  ondansetron (ZOFRAN-ODT) disintegrating tablet 8 mg (8 mg Oral Given 02/06/16 1555)  fentaNYL (SUBLIMAZE) injection 50 mcg (50 mcg Nasal Given 02/06/16 1556)    Patient Vitals for the past 24 hrs:  BP Temp Temp src Pulse Resp SpO2  02/06/16 2102 (!) 135/46 98.3 F (36.8 C) Oral 67 18 100 %  02/06/16 1837 142/63 - - 67 16 92 %  02/06/16 1700 (!) 142/49 - - - 16 -  02/06/16 1630 130/68 - - 62 16 100 %  02/06/16 1500 159/67 - - 61 - 99 %  02/06/16 1402 162/64 97.8 F (36.6 C) Oral (!) 58 18 98 %    9:50 PM Reevaluation with update and discussion. After initial assessment and treatment, an updated evaluation reveals Patient remains very uncomfortable, is not tolerating  liquids, and is unable to ambulate. Findings discussed with patient and family members and all questions were answered. Shravya Wickwire L   21:52- requested a call from neuro hospitalist, to assist with care for concussion. Case was discussed with neuro hospitalist, Dr. Shon Hale, who recommends symptomatic treatment, and reimage brain. If the patient becomes obtunded. He feels that the patient can be managed at this facility, by the hospitalist service.  9:59 PM-Consult complete with Hospitalist. Patient case explained and discussed. He agrees to admit patient for further evaluation and treatment. Call ended at 2225   Final Clinical Impressions(s) / ED Diagnoses   Final diagnoses:  Fall, initial encounter  Injury of head, initial encounter  Concussion without loss of consciousness, initial encounter  Abrasion, multiple sites  Contusion of left hip, initial encounter    Accidental fall, from height approximately 5 feet injury head and left hip. No loss of consciousness appreciated. On arrival, she was symptomatic with headache and nausea which have both persisted. Minor skin abrasions, not requiring closure. The fall, was likely mechanical, while she was walking, carrying a box. Nonspecific elevation of white blood cell count without evidence for acute bacterial infection. Metabolic instability or impending vascular collapse. Patient is elderly, lives alone, and is unable to ambulate. She will therefore require hospitalization for observation and treatment.   Nursing Notes Reviewed/ Care Coordinated Applicable Imaging Reviewed Interpretation of Laboratory Data incorporated into ED treatment  New Prescriptions New Prescriptions   No medications on file     Daleen Bo, MD 02/06/16 2232

## 2016-02-06 NOTE — ED Triage Notes (Signed)
Patient from home.  She tripped and fell going up the stairs hitting the back of her head.  Large hematoma on back of head.  Patient reports pain is a 10/10. Also c/o right hip pain.  Alert and oriented x4 but is c/o dizziness and nausea with movement.

## 2016-02-06 NOTE — ED Notes (Signed)
Bed: PQ98 Expected date: 02/06/16 Expected time: 1:50 PM Means of arrival: Ambulance Comments: fall

## 2016-02-06 NOTE — H&P (Signed)
History and Physical  Patient Name: [name]     ZOX:[id number]    DOB: May 07, 1925    DOA: 02/06/2016 PCP: Scarlette Calico, MD   Patient coming from: Home  Chief Complaint: Fall, head injury  HPI: [name] is a 82 y.o. female with a past medical history significant for COPD, chronic pain from arthritis and scoliosis on 65 OMEs daily who presents with fall and head injury.  The patient was in her usual state of health until this afternoon, she was in the middle of cleaning her basement because she is moving into a retirement home, when she was coming up the stairs from the basement without bucket full of her class for tears in her hand when she fell down 4 or 5 stairs landing on her back striking her head on the concrete basement floor.  She had no prodrome, no chest pain, no shortness of breath, no dizziness, no wooziness before she fell. There was no loss of consciousness. She also does not think that this was a mechanical fall, "I just don't know what happened". After the fall, she had severe headache, dizziness, and nausea and so she was transported to the ER.  ED course: -Afebrile, heart rate 50s, respirations and pulse oximetry normal, blood pressure 162/64 -Na 139, K 3.5, Cr 0.85, WBC 20.2K, Hgb12.5, urinalysis without pyuria or hematuria -CT head and C-spine without intracranial bleeding or fracture -ECG showed sinus rhythm, rate 68, QTC normal, RSR prime pattern in lead V1, unchanged from previous -She was given fentanyl for pain and ondansetron for nausea without improvement -She was given crackers to eat, and although this is documented in nursing notes that she was able to eat without difficulty, she and family tell me that she ate them and then started to have dry heaves and couldn't finish them. -When nursing attempted to walk the patient she was unable to stand           ROS: Review of Systems  Gastrointestinal: Positive for nausea.  Neurological:  Positive for dizziness and headaches.  All other systems reviewed and are negative.         Past Medical History:  Diagnosis Date  . Adenomatous colon polyp 04/26/95   tubulovillous  . Allergy   . Arthritis   . Constipation   . COPD, moderate ([place])   . Degenerative joint disease   . Depression   . Gastritis   . GERD (gastroesophageal reflux disease)   . Glaucoma   . Insomnia   . Lack of bladder control   . Macular degeneration   . OA (osteoarthritis)   . Renal cyst    right  . Shortness of breath   . Small bowel obstruction   . Trigeminal neuralgia     Past Surgical History:  Procedure Laterality Date  . ABDOMINAL HYSTERECTOMY     nonmalignant reasons  . BACK SURGERY  05/20/08  . CATARACT EXTRACTION     left  . CHOLECYSTECTOMY    . DILATION AND CURETTAGE OF UTERUS    . JOINT REPLACEMENT     right hip  . PARTIAL COLECTOMY  04/26/95   tubulovillous adenoma  . SHOULDER SURGERY  12/24/08   left  . TONSILLECTOMY    . TOTAL SHOULDER ARTHROPLASTY Right 07/05/2012   Procedure: RIGHT TOTAL SHOULDER ARTHROPLASTY;  Surgeon: Marin Shutter, MD;  Location: [facility];  Service: Orthopedics;  Laterality: Right;  Right total shoulder arthroplasty    Social History: Patient lives alone,  but is moving into a retirement facility.  The patient walks with a cane.  She is a former smoker, quit 10 years ago.  She  Is from [facility].  She was an Therapist, sports at [employer].    Allergies  Allergen Reactions  . Codeine Itching and Nausea Only    Small amounts okay  . Pregabalin Other (See Comments)    Confusion and hallucination  . Promethazine Hcl Other (See Comments)    Muscle cramps  . Sulfonamide Derivatives Nausea Only    Family history: family history includes Cancer in her father; Colon polyps in her brother; Diabetes in her brother; Heart disease in her sister.  Prior to Admission medications   Medication Sig Start Date End Date Taking? Authorizing Provider  aspirin EC 81 MG tablet Take  81 mg by mouth daily.    Historical Provider, MD  beta carotene w/minerals (OCUVITE) tablet Take 1 tablet by mouth 2 (two) times daily.    Historical Provider, MD  ketorolac (ACULAR) 0.5 % ophthalmic solution Place 1 drop into both eyes 4 (four) times daily.  01/27/16   Historical Provider, MD  morphine (MSIR) 15 MG tablet Take 15 mg by mouth every 6 (six) hours as needed for moderate pain or severe pain.  01/13/16   Historical Provider, MD  omeprazole (PRILOSEC) 20 MG capsule Take 20 mg by mouth daily.    Historical Provider, MD  timolol (BETIMOL) 0.5 % ophthalmic solution Place 1 drop into both eyes 2 (two) times daily.     Historical Provider, MD  travoprost, benzalkonium, (TRAVATAN) 0.004 % ophthalmic solution Place 1 drop into both eyes at bedtime.     Historical Provider, MD       Physical Exam: BP (!) 135/46 (BP Location: Right Arm)   Pulse 67   Temp 98.3 F (36.8 C) (Oral)   Resp 18   SpO2 100%  General appearance: Frail elderly female, alert and in moderate distress from nausea and headache, lying curled in a ball, crying.   Eyes: Anicteric, conjunctiva pink, lids and lashes normal. PERRL.    ENT: No nasal deformity, discharge, epistaxis.  Hearing normal. OP tacky dry without lesions.   Neck: No neck masses.  Trachea midline.  No thyromegaly/tenderness. Lymph: No cervical or supraclavicular lymphadenopathy. Skin: Warm and dry.  No jaundice.  Bruise and abrasion to right forearm.  Scratches, hemostatic, on left hand. Cardiac: RRR, nl S1-S2, no murmurs appreciated.  Capillary refill is brisk.  No LE edema.  Radial pulses 2+ and symmetric. Respiratory: Normal respiratory rate and rhythm.  CTAB without rales or wheezes. MSK: No deformities or effusions.  No cyanosis or clubbing. Neuro: Cranial nerves grossly normal.  Visual field defects unchanged from baseline.  Sensation intact to light touch. Speech is fluent.  Muscle strength unwilling to test.   Unable to sit up or stand.  Nausea  with any movement of head. Psych: Sensorium intact and responding to questions, oriented to person, place time and situation, attention normal.  Behavior appropriate.  Affect: in pain.         Labs on Admission:  I have personally reviewed following labs and imaging studies: CBC:  Recent Labs Lab 02/06/16 1620  WBC 20.2*  NEUTROABS 17.2*  HGB 12.5  HCT 38.4  MCV 88.7  PLT 841   Basic Metabolic Panel:  Recent Labs Lab 02/06/16 1620  NA 139  K 3.5  CL 109  CO2 22  GLUCOSE 102*  BUN 16  CREATININE 0.85  CALCIUM 9.7   GFR: CrCl cannot be calculated (Unknown ideal weight.).  Liver Function Tests:  Recent Labs Lab 02/06/16 1620  AST 27  ALT 18  ALKPHOS 57  BILITOT 0.5  PROT 7.2  ALBUMIN 3.8   No results for input(s): LIPASE, AMYLASE in the last 168 hours. No results for input(s): AMMONIA in the last 168 hours. Coagulation Profile: No results for input(s): INR, PROTIME in the last 168 hours. Cardiac Enzymes: No results for input(s): CKTOTAL, CKMB, CKMBINDEX, TROPONINI in the last 168 hours. BNP (last 3 results) No results for input(s): PROBNP in the last 8760 hours. HbA1C: No results for input(s): HGBA1C in the last 72 hours. CBG:  Recent Labs Lab 02/06/16 1539  GLUCAP 113*   Lipid Profile: No results for input(s): CHOL, HDL, LDLCALC, TRIG, CHOLHDL, LDLDIRECT in the last 72 hours. Thyroid Function Tests: No results for input(s): TSH, T4TOTAL, FREET4, T3FREE, THYROIDAB in the last 72 hours. Anemia Panel: No results for input(s): VITAMINB12, FOLATE, FERRITIN, TIBC, IRON, RETICCTPCT in the last 72 hours. Sepsis Labs: Invalid input(s): PROCALCITONIN, LACTICIDVEN No results found for this or any previous visit (from the past 240 hour(s)).       Radiological Exams on Admission: Personally reviewed following reports: Ct Head Wo Contrast  Result Date: 02/06/2016 CLINICAL DATA:  82 year old female with headache and neck pain following fall and head  injury today. EXAM: CT HEAD WITHOUT CONTRAST CT CERVICAL SPINE WITHOUT CONTRAST TECHNIQUE: Multidetector CT imaging of the head and cervical spine was performed following the standard protocol without intravenous contrast. Multiplanar CT image reconstructions of the cervical spine were also generated. COMPARISON:  10/11/2005 cervical spine radiographs FINDINGS: CT HEAD FINDINGS Brain: No evidence of acute infarction, hemorrhage, hydrocephalus, extra-axial collection or mass lesion/mass effect. Atrophy and chronic small-vessel white matter ischemic changes noted. Vascular: Intracranial vascular calcifications noted. Skull: Normal. Negative for fracture or focal lesion. Sinuses/Orbits: No acute finding. Other: A posterior scalp hematoma is noted. CT CERVICAL SPINE FINDINGS No acute fracture, acute subluxation or prevertebral soft tissue swelling identified. Moderate multilevel degenerative disc disease and spondylosis noted. No suspicious focal bony lesions are identified. No soft tissue abnormalities are identified. The lung apices are clear. IMPRESSION: No evidence of acute intracranial abnormality. Atrophy and chronic small-vessel white matter ischemic changes. Posterior scalp hematoma without underlying fracture. No static evidence of acute injury to the cervical spine. Moderate multilevel degenerative changes. Electronically Signed   By: Margarette Canada M.D.   On: 02/06/2016 16:34   Ct Cervical Spine Wo Contrast  Result Date: 02/06/2016 CLINICAL DATA:  82 year old female with headache and neck pain following fall and head injury today. EXAM: CT HEAD WITHOUT CONTRAST CT CERVICAL SPINE WITHOUT CONTRAST TECHNIQUE: Multidetector CT imaging of the head and cervical spine was performed following the standard protocol without intravenous contrast. Multiplanar CT image reconstructions of the cervical spine were also generated. COMPARISON:  10/11/2005 cervical spine radiographs FINDINGS: CT HEAD FINDINGS Brain: No  evidence of acute infarction, hemorrhage, hydrocephalus, extra-axial collection or mass lesion/mass effect. Atrophy and chronic small-vessel white matter ischemic changes noted. Vascular: Intracranial vascular calcifications noted. Skull: Normal. Negative for fracture or focal lesion. Sinuses/Orbits: No acute finding. Other: A posterior scalp hematoma is noted. CT CERVICAL SPINE FINDINGS No acute fracture, acute subluxation or prevertebral soft tissue swelling identified. Moderate multilevel degenerative disc disease and spondylosis noted. No suspicious focal bony lesions are identified. No soft tissue abnormalities are identified. The lung apices are clear. IMPRESSION: No evidence of acute intracranial abnormality. Atrophy and  chronic small-vessel white matter ischemic changes. Posterior scalp hematoma without underlying fracture. No static evidence of acute injury to the cervical spine. Moderate multilevel degenerative changes. Electronically Signed   By: Margarette Canada M.D.   On: 02/06/2016 16:34    EKG: Independently reviewed. Rate 68, QTc 469, RSR' pattern old, no ST segment changes.            Assessment/Plan  1. Concussion:  No bleeding on CT head.  No focal neurological signs.  Dizziness, nausea and headache worse with motion, after head strike on concrete today.  No prodrome to fall and no LOC at fall, no syncope reported. -MIVF -ondansetron or compazine for nausea -Acetaminophen scheduled -Ibuprofen or morphine IV for breakthrough pain -CM consult for home health if needed -PT eval      2. Leukocytosis:  Suspect reactive.  No evidence for infection/UTI.  3. Chronic pain from OA and chronic back pain:  -Continue morphine IR 15 mg every 6 hours PRN in addition to breakthrough pain medicine above  4. Scrapes to hands:  From fall.  Hemostatic. -WOC consult  5. Other medications:  -Hold aspirin while in OBS status -Hold eye drops while in OBS status -Continue PPI per  patient request -Continue trazodone PRN for sleep       DVT prophylaxis: SCDs  Code Status: DO NOT RESUSCITATE Family Communication: Son and sister at bedside.  POA not present.  Disposition Plan: Anticipate IV fluids and pain control overnight, re-evaluate in morning.  PT eval and CM for home ehalth PT if needed, or if not able to take PO tomorrow, may need inpatient care for further pain control with IV narcotics and IVF. Consults called: None overnight Admission status: OBS, med surg At the point of initial evaluation, it is my clinical opinion that admission for OBSERVATION is reasonable and necessary because the patient's presenting complaints in the context of their chronic conditions represent sufficient risk of deterioration or significant morbidity to constitute reasonable grounds for close observation in the hospital setting, but that the patient may be medically stable for discharge from the hospital within 24 to 48 hours.    Medical decision making: Patient seen at 10:15 PM on 02/06/2016.  The patient was discussed with Dr. Eulis Foster.  What exists of the patient's chart was reviewed in depth and summarized above.  Clinical condition: stable.        Edwin Dada [facility] Pager [phone]

## 2016-02-06 NOTE — ED Notes (Signed)
Patient transported to CT 

## 2016-02-07 DIAGNOSIS — Z90710 Acquired absence of both cervix and uterus: Secondary | ICD-10-CM | POA: Diagnosis not present

## 2016-02-07 DIAGNOSIS — S7002XD Contusion of left hip, subsequent encounter: Secondary | ICD-10-CM | POA: Diagnosis not present

## 2016-02-07 DIAGNOSIS — Z79899 Other long term (current) drug therapy: Secondary | ICD-10-CM | POA: Diagnosis not present

## 2016-02-07 DIAGNOSIS — S060X0A Concussion without loss of consciousness, initial encounter: Secondary | ICD-10-CM | POA: Diagnosis present

## 2016-02-07 DIAGNOSIS — Z66 Do not resuscitate: Secondary | ICD-10-CM | POA: Diagnosis present

## 2016-02-07 DIAGNOSIS — M549 Dorsalgia, unspecified: Secondary | ICD-10-CM | POA: Diagnosis present

## 2016-02-07 DIAGNOSIS — R2689 Other abnormalities of gait and mobility: Secondary | ICD-10-CM | POA: Diagnosis not present

## 2016-02-07 DIAGNOSIS — Z7982 Long term (current) use of aspirin: Secondary | ICD-10-CM | POA: Diagnosis not present

## 2016-02-07 DIAGNOSIS — H401123 Primary open-angle glaucoma, left eye, severe stage: Secondary | ICD-10-CM | POA: Diagnosis not present

## 2016-02-07 DIAGNOSIS — Z96641 Presence of right artificial hip joint: Secondary | ICD-10-CM | POA: Diagnosis present

## 2016-02-07 DIAGNOSIS — W108XXA Fall (on) (from) other stairs and steps, initial encounter: Secondary | ICD-10-CM | POA: Diagnosis present

## 2016-02-07 DIAGNOSIS — S060X0D Concussion without loss of consciousness, subsequent encounter: Secondary | ICD-10-CM | POA: Diagnosis not present

## 2016-02-07 DIAGNOSIS — F439 Reaction to severe stress, unspecified: Secondary | ICD-10-CM | POA: Diagnosis present

## 2016-02-07 DIAGNOSIS — Z96619 Presence of unspecified artificial shoulder joint: Secondary | ICD-10-CM | POA: Diagnosis present

## 2016-02-07 DIAGNOSIS — Y92008 Other place in unspecified non-institutional (private) residence as the place of occurrence of the external cause: Secondary | ICD-10-CM | POA: Diagnosis not present

## 2016-02-07 DIAGNOSIS — R030 Elevated blood-pressure reading, without diagnosis of hypertension: Secondary | ICD-10-CM | POA: Diagnosis present

## 2016-02-07 DIAGNOSIS — G8929 Other chronic pain: Secondary | ICD-10-CM | POA: Diagnosis present

## 2016-02-07 DIAGNOSIS — Z9181 History of falling: Secondary | ICD-10-CM | POA: Diagnosis not present

## 2016-02-07 DIAGNOSIS — S7002XA Contusion of left hip, initial encounter: Secondary | ICD-10-CM | POA: Diagnosis present

## 2016-02-07 DIAGNOSIS — R41841 Cognitive communication deficit: Secondary | ICD-10-CM | POA: Diagnosis not present

## 2016-02-07 DIAGNOSIS — Z87891 Personal history of nicotine dependence: Secondary | ICD-10-CM | POA: Diagnosis not present

## 2016-02-07 DIAGNOSIS — S060X9A Concussion with loss of consciousness of unspecified duration, initial encounter: Secondary | ICD-10-CM | POA: Diagnosis not present

## 2016-02-07 DIAGNOSIS — H401113 Primary open-angle glaucoma, right eye, severe stage: Secondary | ICD-10-CM | POA: Diagnosis not present

## 2016-02-07 DIAGNOSIS — D72829 Elevated white blood cell count, unspecified: Secondary | ICD-10-CM | POA: Diagnosis present

## 2016-02-07 DIAGNOSIS — M419 Scoliosis, unspecified: Secondary | ICD-10-CM | POA: Diagnosis present

## 2016-02-07 DIAGNOSIS — R42 Dizziness and giddiness: Secondary | ICD-10-CM | POA: Diagnosis present

## 2016-02-07 DIAGNOSIS — R259 Unspecified abnormal involuntary movements: Secondary | ICD-10-CM | POA: Diagnosis not present

## 2016-02-07 DIAGNOSIS — R278 Other lack of coordination: Secondary | ICD-10-CM | POA: Diagnosis not present

## 2016-02-07 DIAGNOSIS — H04123 Dry eye syndrome of bilateral lacrimal glands: Secondary | ICD-10-CM | POA: Diagnosis not present

## 2016-02-07 DIAGNOSIS — R1312 Dysphagia, oropharyngeal phase: Secondary | ICD-10-CM | POA: Diagnosis not present

## 2016-02-07 DIAGNOSIS — S060X9S Concussion with loss of consciousness of unspecified duration, sequela: Secondary | ICD-10-CM | POA: Diagnosis not present

## 2016-02-07 DIAGNOSIS — M1990 Unspecified osteoarthritis, unspecified site: Secondary | ICD-10-CM | POA: Diagnosis present

## 2016-02-07 DIAGNOSIS — J449 Chronic obstructive pulmonary disease, unspecified: Secondary | ICD-10-CM | POA: Diagnosis present

## 2016-02-07 DIAGNOSIS — Z9049 Acquired absence of other specified parts of digestive tract: Secondary | ICD-10-CM | POA: Diagnosis not present

## 2016-02-07 LAB — CBC
HCT: 31 % — ABNORMAL LOW (ref 36.0–46.0)
Hemoglobin: 10.4 g/dL — ABNORMAL LOW (ref 12.0–15.0)
MCH: 28.9 pg (ref 26.0–34.0)
MCHC: 33.5 g/dL (ref 30.0–36.0)
MCV: 86.1 fL (ref 78.0–100.0)
PLATELETS: 229 10*3/uL (ref 150–400)
RBC: 3.6 MIL/uL — ABNORMAL LOW (ref 3.87–5.11)
RDW: 13.8 % (ref 11.5–15.5)
WBC: 8.5 10*3/uL (ref 4.0–10.5)

## 2016-02-07 LAB — BASIC METABOLIC PANEL
Anion gap: 7 (ref 5–15)
BUN: 12 mg/dL (ref 6–20)
CO2: 18 mmol/L — ABNORMAL LOW (ref 22–32)
CREATININE: 0.82 mg/dL (ref 0.44–1.00)
Calcium: 8.5 mg/dL — ABNORMAL LOW (ref 8.9–10.3)
Chloride: 113 mmol/L — ABNORMAL HIGH (ref 101–111)
GFR calc Af Amer: >60 mL/min (ref >60)
GLUCOSE: 105 mg/dL — AB (ref 65–99)
Potassium: 3.4 mmol/L — ABNORMAL LOW (ref 3.5–5.1)
SODIUM: 138 mmol/L (ref 135–145)

## 2016-02-07 MED ORDER — PROCHLORPERAZINE EDISYLATE 5 MG/ML IJ SOLN
5.0000 mg | Freq: Four times a day (QID) | INTRAMUSCULAR | Status: DC | PRN
  Administered 2016-02-07: 5 mg via INTRAVENOUS
  Filled 2016-02-07: qty 2

## 2016-02-07 MED ORDER — ONDANSETRON HCL 4 MG PO TABS: 4.0000 mg | ORAL_TABLET | Freq: Four times a day (QID) | ORAL | Status: DC | PRN

## 2016-02-07 MED ORDER — BISACODYL 10 MG RE SUPP: 10.0000 mg | Freq: Every day | RECTAL | Status: DC | PRN

## 2016-02-07 MED ORDER — SODIUM CHLORIDE 0.9 % IV SOLN
INTRAVENOUS | Status: DC
  Administered 2016-02-07 – 2016-02-09 (×4): via INTRAVENOUS

## 2016-02-07 MED ORDER — MECLIZINE HCL 12.5 MG PO TABS
12.5000 mg | ORAL_TABLET | Freq: Three times a day (TID) | ORAL | Status: DC
  Administered 2016-02-07 – 2016-02-10 (×9): 12.5 mg via ORAL
  Filled 2016-02-07 (×11): qty 1

## 2016-02-07 MED ORDER — IBUPROFEN 200 MG PO TABS: 400.0000 mg | ORAL_TABLET | Freq: Four times a day (QID) | ORAL | Status: DC | PRN

## 2016-02-07 MED ORDER — ONDANSETRON HCL 4 MG/2ML IJ SOLN
4.0000 mg | Freq: Four times a day (QID) | INTRAMUSCULAR | Status: DC | PRN
  Administered 2016-02-07 (×2): 4 mg via INTRAVENOUS
  Filled 2016-02-07 (×2): qty 2

## 2016-02-07 MED ORDER — ACETAMINOPHEN 500 MG PO TABS
1000.0000 mg | ORAL_TABLET | Freq: Three times a day (TID) | ORAL | Status: DC
  Administered 2016-02-07 – 2016-02-10 (×10): 1000 mg via ORAL
  Filled 2016-02-07 (×10): qty 2

## 2016-02-07 MED ORDER — TRAZODONE HCL 50 MG PO TABS
50.0000 mg | ORAL_TABLET | Freq: Every evening | ORAL | Status: DC | PRN
  Administered 2016-02-07 (×2): 50 mg via ORAL
  Filled 2016-02-07 (×2): qty 1

## 2016-02-07 MED ORDER — SENNOSIDES-DOCUSATE SODIUM 8.6-50 MG PO TABS: 1.0000 | ORAL_TABLET | Freq: Every evening | ORAL | Status: DC | PRN

## 2016-02-07 MED ORDER — MORPHINE SULFATE 15 MG PO TABS
15.0000 mg | ORAL_TABLET | Freq: Four times a day (QID) | ORAL | Status: DC | PRN
  Administered 2016-02-07 – 2016-02-08 (×2): 15 mg via ORAL
  Filled 2016-02-07 (×2): qty 1

## 2016-02-07 NOTE — Evaluation (Signed)
Physical Therapy Evaluation Patient Details Name: [name] MRN: [id number] DOB: 1925-12-29 Today's Date: 02/07/2016   History of Present Illness  82 yo female admitted with concussion, posterior scalp hematoma after falling at home. Hx of COPD, OA, chronic pain, macular degeneration, scoliosis  Clinical Impression  On eval, pt required Min assist +2 for mobility. She was able to walk ~75 feet with a RW, but not without significant nausea and dizziness (pt reports spinning) with head movements and positional changes. Observed vertical nystagmus during transition from sit to supine. Do not feel pt is safe to d/c home alone at this time. Recommend SNF. Will plan to have a therapist return on tomorrow to perform vestibular evaluation.     Follow Up Recommendations SNF    Equipment Recommendations  Rolling walker with 5" wheels    Recommendations for Other Services       Precautions / Restrictions Precautions Precautions: Fall Restrictions Weight Bearing Restrictions: No      Mobility  Bed Mobility Overal bed mobility: Needs Assistance Bed Mobility: Rolling;Sidelying to Sit;Sit to Supine Rolling: Min assist Sidelying to sit: Min assist;+2 for physical assistance;+2 for safety/equipment   Sit to supine: Min assist   General bed mobility comments: Pt c/o dizziness with supine to sidelying and with sit to supine. Vertical nystagmus observed once pt returned to supine.  Transfers Overall transfer level: Needs assistance Equipment used: Rolling walker (2 wheeled) Transfers: Sit to/from Stand Sit to Stand: Min assist;+2 physical assistance;+2 safety/equipment         General transfer comment: Assist to rise, stabilize, control descent. VCs safety, hand placement. Cues for pt to focus gaze to see if dizziness subsided.   Ambulation/Gait Ambulation/Gait assistance: Min assist;+2 physical assistance;+2 safety/equipment Ambulation Distance (Feet): 75 Feet Assistive  device: Rolling walker (2 wheeled) Gait Pattern/deviations: Step-through pattern;Decreased stride length     General Gait Details: Assist to stabilize pt and maneuver with RW. Pt was very unsteady.  Stairs            Wheelchair Mobility    Modified Rankin (Stroke Patients Only)       Balance Overall balance assessment: Needs assistance;History of Falls           Standing balance-Leahy Scale: Poor                               Pertinent Vitals/Pain Pain Assessment: No/denies pain    Home Living Family/patient expects to be discharged to:: Unsure Living Arrangements: Alone   Type of Home: House Home Access: Stairs to enter Entrance Stairs-Rails: None Entrance Stairs-Number of Steps: 2 Home Layout: Two level;Able to live on main level with bedroom/bathroom Home Equipment: Gilford Rile - 2 wheels      Prior Function Level of Independence: Needs assistance      ADL's / Homemaking Assistance Needed: sometimes gets help with meals  Comments: uses RW outside. No assistive devices     Hand Dominance        Extremity/Trunk Assessment   Upper Extremity Assessment: Generalized weakness           Lower Extremity Assessment: Generalized weakness      Cervical / Trunk Assessment: Normal  Communication   Communication: No difficulties  Cognition Arousal/Alertness: Awake/alert Behavior During Therapy: WFL for tasks assessed/performed Overall Cognitive Status: Within Functional Limits for tasks assessed  General Comments      Exercises     Assessment/Plan    PT Assessment Patient needs continued PT services  PT Problem List Decreased strength;Decreased mobility;Decreased activity tolerance;Decreased balance;Decreased knowledge of use of DME          PT Treatment Interventions DME instruction;Gait training;Therapeutic activities;Therapeutic exercise;Patient/family education;Functional mobility  training;Balance training    PT Goals (Current goals can be found in the Care Plan section)  Acute Rehab PT Goals Patient Stated Goal: less dizzy.  PT Goal Formulation: With patient Time For Goal Achievement: 02/21/16 Potential to Achieve Goals: Good    Frequency Min 3X/week   Barriers to discharge        Co-evaluation               End of Session Equipment Utilized During Treatment: Gait belt Activity Tolerance:  (Limited by dizziness, nausea) Patient left: in bed;with call bell/phone within reach;with bed alarm set      Functional Assessment Tool Used: clinical judgement Functional Limitation: Mobility: Walking and moving around Mobility: Walking and Moving Around Current Status ([id number]): At least 20 percent but less than 40 percent impaired, limited or restricted Mobility: Walking and Moving Around Goal Status [phone]): At least 1 percent but less than 20 percent impaired, limited or restricted    Time: 1411-1435 PT Time Calculation (min) (ACUTE ONLY): 24 min   Charges:   PT Evaluation $PT Eval Low Complexity: 1 Procedure PT Treatments $Gait Training: 8-22 mins   PT G Codes:   PT G-Codes **NOT FOR INPATIENT CLASS** Functional Assessment Tool Used: clinical judgement Functional Limitation: Mobility: Walking and moving around Mobility: Walking and Moving Around Current Status (U9811): At least 20 percent but less than 40 percent impaired, limited or restricted Mobility: Walking and Moving Around Goal Status [phone]): At least 1 percent but less than 20 percent impaired, limited or restricted    Weston Anna, MPT Pager: [phone]

## 2016-02-07 NOTE — Progress Notes (Signed)
Pt hadn't voided since I&O cath at 0830. Bladder scanned at 1500 with only 141 mls showing in bladder. Pt tried to void but only emptied 50 mls from bladder. Encouraged patient to increase fluid intake. Patient is agreeable. Paged Dr. Wendee Beavers who was agreeable with plan. Will continue to monitor patient.

## 2016-02-07 NOTE — Progress Notes (Signed)
PROGRESS NOTE    LUSERO NORDLUND  YHC:[id number] DOB: 1925/07/30 DOA: 02/06/2016 PCP: Scarlette Calico, MD    Brief Narrative:  82 y.o. female with a past medical history significant for COPD, chronic pain from arthritis and scoliosis on 21 OMEs daily who presents with fall and head injury.  Assessment & Plan:   Principal Problem:   Concussion - CT of head reports no evidence of acute intracranial abnormality. CT of cervical spine reports no static evidence of acute injury to the cervical spine.  Active Problems:   Osteoarthritis - continue supportive therapy  Dizziness - try trial of meclizine - Physical therapy    Leukocytosis - Will reassess cbc next am. Suspect secondary to stress reaction from fall. - urinalysis results reviewed and negative. No complaints of chest congestion, cough, or productive sputum  DVT prophylaxis: SCD Code Status: DNR Family Communication: Disposition Plan: pending evaluation by PT and trial of meclizine. Pt still c/o dizziness and nausea  Consultants:   None   Procedures: None   Antimicrobials: None   Subjective: Pt complaining of dizziness with movement.   Objective: Vitals:   02/06/16 2358 02/07/16 0021 02/07/16 0503 02/07/16 1330  BP: 134/75 (!) 159/53 (!) 141/48 (!) 114/39  Pulse: 61 62 73 62  Resp: 13 16 15 16   Temp: 98.3 F (36.8 C) 98.2 F (36.8 C) 98.4 F (36.9 C) 98.3 F (36.8 C)  TempSrc: Oral Oral Oral Oral  SpO2: 97% 99% 100% 94%  Weight: 50.8 kg (112 lb) 52.9 kg (116 lb 10 oz)    Height: 5\' 2"  (1.575 m) 5\' 2"  (1.575 m)      Intake/Output Summary (Last 24 hours) at 02/07/16 1505 Last data filed at 02/07/16 1500  Gross per 24 hour  Intake           986.67 ml  Output              600 ml  Net           386.67 ml   Filed Weights   02/06/16 2358 02/07/16 0021  Weight: 50.8 kg (112 lb) 52.9 kg (116 lb 10 oz)    Examination:  General exam: Appears calm and comfortable, in nad. Respiratory system: Clear to  auscultation. Respiratory effort normal. Cardiovascular system: S1 & S2 heard, RRR. No JVD, murmurs, rubs, gallops or clicks. No pedal edema. Gastrointestinal system: Abdomen is nondistended, soft and nontender. No organomegaly or masses felt. Normal bowel sounds heard. Central nervous system: Alert and oriented. No focal neurological deficits. +horizontal nystagmus  Extremities: Symmetric 5 x 5 power. Skin: No rashes or ulcers on limited exam. Psychiatry: Judgement and insight appear normal. Mood & affect appropriate.     Data Reviewed: I have personally reviewed following labs and imaging studies  CBC:  Recent Labs Lab 02/06/16 1620  WBC 20.2*  NEUTROABS 17.2*  HGB 12.5  HCT 38.4  MCV 88.7  PLT 517   Basic Metabolic Panel:  Recent Labs Lab 02/06/16 1620  NA 139  K 3.5  CL 109  CO2 22  GLUCOSE 102*  BUN 16  CREATININE 0.85  CALCIUM 9.7   GFR: Estimated Creatinine Clearance: 36.2 mL/min (by C-G formula based on SCr of 0.85 mg/dL). Liver Function Tests:  Recent Labs Lab 02/06/16 1620  AST 27  ALT 18  ALKPHOS 57  BILITOT 0.5  PROT 7.2  ALBUMIN 3.8   No results for input(s): LIPASE, AMYLASE in the last 168 hours. No results for input(s): AMMONIA in  the last 168 hours. Coagulation Profile: No results for input(s): INR, PROTIME in the last 168 hours. Cardiac Enzymes: No results for input(s): CKTOTAL, CKMB, CKMBINDEX, TROPONINI in the last 168 hours. BNP (last 3 results) No results for input(s): PROBNP in the last 8760 hours. HbA1C: No results for input(s): HGBA1C in the last 72 hours. CBG:  Recent Labs Lab 02/06/16 1539  GLUCAP 113*   Lipid Profile: No results for input(s): CHOL, HDL, LDLCALC, TRIG, CHOLHDL, LDLDIRECT in the last 72 hours. Thyroid Function Tests: No results for input(s): TSH, T4TOTAL, FREET4, T3FREE, THYROIDAB in the last 72 hours. Anemia Panel: No results for input(s): VITAMINB12, FOLATE, FERRITIN, TIBC, IRON, RETICCTPCT in the  last 72 hours. Sepsis Labs: No results for input(s): PROCALCITON, LATICACIDVEN in the last 168 hours.  No results found for this or any previous visit (from the past 240 hour(s)).       Radiology Studies: Ct Head Wo Contrast  Result Date: 02/06/2016 CLINICAL DATA:  82 year old female with headache and neck pain following fall and head injury today. EXAM: CT HEAD WITHOUT CONTRAST CT CERVICAL SPINE WITHOUT CONTRAST TECHNIQUE: Multidetector CT imaging of the head and cervical spine was performed following the standard protocol without intravenous contrast. Multiplanar CT image reconstructions of the cervical spine were also generated. COMPARISON:  10/11/2005 cervical spine radiographs FINDINGS: CT HEAD FINDINGS Brain: No evidence of acute infarction, hemorrhage, hydrocephalus, extra-axial collection or mass lesion/mass effect. Atrophy and chronic small-vessel white matter ischemic changes noted. Vascular: Intracranial vascular calcifications noted. Skull: Normal. Negative for fracture or focal lesion. Sinuses/Orbits: No acute finding. Other: A posterior scalp hematoma is noted. CT CERVICAL SPINE FINDINGS No acute fracture, acute subluxation or prevertebral soft tissue swelling identified. Moderate multilevel degenerative disc disease and spondylosis noted. No suspicious focal bony lesions are identified. No soft tissue abnormalities are identified. The lung apices are clear. IMPRESSION: No evidence of acute intracranial abnormality. Atrophy and chronic small-vessel white matter ischemic changes. Posterior scalp hematoma without underlying fracture. No static evidence of acute injury to the cervical spine. Moderate multilevel degenerative changes. Electronically Signed   By: Margarette Canada M.D.   On: 02/06/2016 16:34   Ct Cervical Spine Wo Contrast  Result Date: 02/06/2016 CLINICAL DATA:  82 year old female with headache and neck pain following fall and head injury today. EXAM: CT HEAD WITHOUT CONTRAST  CT CERVICAL SPINE WITHOUT CONTRAST TECHNIQUE: Multidetector CT imaging of the head and cervical spine was performed following the standard protocol without intravenous contrast. Multiplanar CT image reconstructions of the cervical spine were also generated. COMPARISON:  10/11/2005 cervical spine radiographs FINDINGS: CT HEAD FINDINGS Brain: No evidence of acute infarction, hemorrhage, hydrocephalus, extra-axial collection or mass lesion/mass effect. Atrophy and chronic small-vessel white matter ischemic changes noted. Vascular: Intracranial vascular calcifications noted. Skull: Normal. Negative for fracture or focal lesion. Sinuses/Orbits: No acute finding. Other: A posterior scalp hematoma is noted. CT CERVICAL SPINE FINDINGS No acute fracture, acute subluxation or prevertebral soft tissue swelling identified. Moderate multilevel degenerative disc disease and spondylosis noted. No suspicious focal bony lesions are identified. No soft tissue abnormalities are identified. The lung apices are clear. IMPRESSION: No evidence of acute intracranial abnormality. Atrophy and chronic small-vessel white matter ischemic changes. Posterior scalp hematoma without underlying fracture. No static evidence of acute injury to the cervical spine. Moderate multilevel degenerative changes. Electronically Signed   By: Margarette Canada M.D.   On: 02/06/2016 16:34    Scheduled Meds: . acetaminophen  1,000 mg Oral TID  . meclizine  12.5  mg Oral TID  . pantoprazole  40 mg Oral Daily   Continuous Infusions: . sodium chloride 100 mL/hr at 02/07/16 0053     LOS: 0 days    Time spent: > 35 minutes  Velvet Bathe, MD [facility] Pager [phone]  If 7PM-7AM, please contact night-coverage [email or web address] Password [email or web address] 02/07/2016, 3:05 PM

## 2016-02-08 DIAGNOSIS — S060X9S Concussion with loss of consciousness of unspecified duration, sequela: Secondary | ICD-10-CM

## 2016-02-08 DIAGNOSIS — H04123 Dry eye syndrome of bilateral lacrimal glands: Secondary | ICD-10-CM | POA: Diagnosis not present

## 2016-02-08 DIAGNOSIS — H401123 Primary open-angle glaucoma, left eye, severe stage: Secondary | ICD-10-CM | POA: Diagnosis not present

## 2016-02-08 DIAGNOSIS — H401113 Primary open-angle glaucoma, right eye, severe stage: Secondary | ICD-10-CM | POA: Diagnosis not present

## 2016-02-08 MED ORDER — TIMOLOL MALEATE 0.5 % OP SOLN
1.0000 [drp] | Freq: Two times a day (BID) | OPHTHALMIC | Status: DC
  Administered 2016-02-08 – 2016-02-10 (×5): 1 [drp] via OPHTHALMIC
  Filled 2016-02-08: qty 5

## 2016-02-08 MED ORDER — LATANOPROST 0.005 % OP SOLN
1.0000 [drp] | Freq: Every day | OPHTHALMIC | Status: DC
  Administered 2016-02-08 – 2016-02-09 (×2): 1 [drp] via OPHTHALMIC
  Filled 2016-02-08: qty 2.5

## 2016-02-08 MED ORDER — MORPHINE SULFATE 15 MG PO TABS
15.0000 mg | ORAL_TABLET | Freq: Four times a day (QID) | ORAL | Status: DC
  Administered 2016-02-08 – 2016-02-10 (×10): 15 mg via ORAL
  Filled 2016-02-08 (×10): qty 1

## 2016-02-08 NOTE — Progress Notes (Signed)
Physical Therapy Treatment Patient Details Name: [name] MRN: [id number] DOB: 01-29-1926 Today's Date: 02/08/2016    History of Present Illness 82 yo female admitted with concussion, posterior scalp hematoma after falling at home. Hx of COPD, OA, chronic pain, macular degeneration, scoliosis    PT Comments    Pt with rotational nystagmus upon rolling into supine position with PT entering room so performed vestibular examination.  Pt reports fall and hitting posterior head prior to admission.  Pt reports dizziness/spinning sensation began after fall.  Spinning worse with head movement, positional changes and short duration.  Pt with no spontaneous nystagmus, no gaze holding nystagmus, normal smooth pursuits, slow saccades however no noticeable discrepancies.  Attempted VOR head thrust however pt with difficulty allowing therapist to control head movement.  Horizontal testing negative.  Pt positive for upbeating rotational nystagmus bilaterally (both lasting approx 30 sec each side) with sidelying test indicating possibly of posterior canal BPPV bilaterally.  Pt states neither side worse so started with performing Liberatory maneuver for right PC BPPV.  Will check back with pt tomorrow to reassess and treat if needed.   Follow Up Recommendations  SNF ((recommend PT familiar with vestibular) )     Equipment Recommendations  Rolling walker with 5" wheels    Recommendations for Other Services       Precautions / Restrictions Precautions Precautions: Fall    Mobility  Bed Mobility Overal bed mobility: Needs Assistance Bed Mobility: Supine to Sit;Sit to Supine     Supine to sit: Min assist Sit to supine: Min assist   General bed mobility comments: assist for trunk upright, slow pace in attempts to not stir dizziness  Transfers Overall transfer level: Needs assistance Equipment used: Rolling walker (2 wheeled) Transfers: Sit to/from Stand Sit to Stand: Min assist;+2  safety/equipment         General transfer comment: assist to stabilize upon rise, verbal cues for hand placement  Ambulation/Gait Ambulation/Gait assistance: Min assist;+2 safety/equipment Ambulation Distance (Feet): 75 Feet Assistive device: Rolling walker (2 wheeled) Gait Pattern/deviations: Step-through pattern;Decreased stride length     General Gait Details: pt reports 7/10 pain R hip initially however states improved with distance, dizziness only 2/10, educated to focus on stationary object if/when dizzy   Stairs            Wheelchair Mobility    Modified Rankin (Stroke Patients Only)       Balance                                    Cognition Arousal/Alertness: Awake/alert Behavior During Therapy: WFL for tasks assessed/performed Overall Cognitive Status: Within Functional Limits for tasks assessed                      Exercises      General Comments        Pertinent Vitals/Pain Pain Assessment: 0-10 Pain Score: 7  Pain Location: R hip Pain Descriptors / Indicators: Sore Pain Intervention(s): Limited activity within patient's tolerance;Monitored during session;Repositioned (reports pain decreased with mobility)    Home Living                      Prior Function            PT Goals (current goals can now be found in the care plan section) Progress towards PT goals: Progressing toward goals  Frequency    Min 3X/week      PT Plan Current plan remains appropriate    Co-evaluation             End of Session Equipment Utilized During Treatment: Gait belt Activity Tolerance: Patient tolerated treatment well Patient left: in bed;with call bell/phone within reach;with bed alarm set;with family/visitor present     Time: 1351-1433 PT Time Calculation (min) (ACUTE ONLY): 42 min  Charges:  $Canalith Rep Proc: 8-22 mins $Physical Performance Test: 8-22 mins                    G Codes:       Savahanna Almendariz,KATHrine E 02/08/2016, 4:09 PM Carmelia Bake, PT, DPT 02/08/2016 Pager: [phone]

## 2016-02-08 NOTE — Clinical Social Work Note (Signed)
MSW met with patient at bedside to complete psychosocial assessment. Full psychosocial assessment to follow.   MSW remains available as needed.   Glendon Axe, MSW [phone] 02/08/2016 12:00 PM

## 2016-02-08 NOTE — Progress Notes (Signed)
PROGRESS NOTE    [name]  CZY:[id number] DOB: 08-01-1925 DOA: 02/06/2016 PCP: Scarlette Calico, MD    Brief Narrative:  82 y.o. female with a past medical history significant for COPD, chronic pain from arthritis and scoliosis on 54 OMEs daily who presents with fall and head injury.  Assessment & Plan:   Principal Problem:   Concussion - CT of head reports no evidence of acute intracranial abnormality. CT of cervical spine reports no static evidence of acute injury to the cervical spine.  Active Problems:   Osteoarthritis - continue supportive therapy  Dizziness - improving on trial of meclizine - Physical therapy to perform vestibular evaluation, awaiting results    Leukocytosis - resolved.  DVT prophylaxis: SCD Code Status: DNR Family Communication: Disposition Plan: pending evaluation by PT and trial of meclizine. Pt still c/o dizziness and nausea but improving.  Consultants:   None   Procedures: None   Antimicrobials: None   Subjective: Pt has no new complaints.   Objective: Vitals:   02/07/16 1330 02/07/16 2115 02/08/16 0637 02/08/16 1400  BP: (!) 114/39 (!) 149/45 (!) 138/57 (!) 116/40  Pulse: 62 (!) 55 68 65  Resp: 16 16 18 17   Temp: 98.3 F (36.8 C) 98.2 F (36.8 C) 98.4 F (36.9 C) 98 F (36.7 C)  TempSrc: Oral Oral Oral Oral  SpO2: 94% 95% 95% 95%  Weight:      Height:        Intake/Output Summary (Last 24 hours) at 02/08/16 1611 Last data filed at 02/08/16 1400  Gross per 24 hour  Intake             2360 ml  Output             1600 ml  Net              760 ml   Filed Weights   02/06/16 2358 02/07/16 0021  Weight: 50.8 kg (112 lb) 52.9 kg (116 lb 10 oz)    Examination:  General exam: Appears calm and comfortable, in nad. Respiratory system: Clear to auscultation. Respiratory effort normal. Cardiovascular system: S1 & S2 heard, RRR. No JVD, murmurs, rubs, gallops or clicks. No pedal edema. Gastrointestinal system: Abdomen  is nondistended, soft and nontender. No organomegaly or masses felt. Normal bowel sounds heard. Central nervous system: Alert and oriented. No focal neurological deficits. +horizontal nystagmus  Extremities: Symmetric 5 x 5 power. Skin: No rashes or ulcers on limited exam. Psychiatry: Judgement and insight appear normal. Mood & affect appropriate.   Data Reviewed: I have personally reviewed following labs and imaging studies  CBC:  Recent Labs Lab 02/06/16 1620 02/07/16 2305  WBC 20.2* 8.5  NEUTROABS 17.2*  --   HGB 12.5 10.4*  HCT 38.4 31.0*  MCV 88.7 86.1  PLT 286 093   Basic Metabolic Panel:  Recent Labs Lab 02/06/16 1620 02/07/16 2305  NA 139 138  K 3.5 3.4*  CL 109 113*  CO2 22 18*  GLUCOSE 102* 105*  BUN 16 12  CREATININE 0.85 0.82  CALCIUM 9.7 8.5*   GFR: Estimated Creatinine Clearance: 37.5 mL/min (by C-G formula based on SCr of 0.82 mg/dL). Liver Function Tests:  Recent Labs Lab 02/06/16 1620  AST 27  ALT 18  ALKPHOS 57  BILITOT 0.5  PROT 7.2  ALBUMIN 3.8   No results for input(s): LIPASE, AMYLASE in the last 168 hours. No results for input(s): AMMONIA in the last 168 hours. Coagulation Profile: No results  for input(s): INR, PROTIME in the last 168 hours. Cardiac Enzymes: No results for input(s): CKTOTAL, CKMB, CKMBINDEX, TROPONINI in the last 168 hours. BNP (last 3 results) No results for input(s): PROBNP in the last 8760 hours. HbA1C: No results for input(s): HGBA1C in the last 72 hours. CBG:  Recent Labs Lab 02/06/16 1539  GLUCAP 113*   Lipid Profile: No results for input(s): CHOL, HDL, LDLCALC, TRIG, CHOLHDL, LDLDIRECT in the last 72 hours. Thyroid Function Tests: No results for input(s): TSH, T4TOTAL, FREET4, T3FREE, THYROIDAB in the last 72 hours. Anemia Panel: No results for input(s): VITAMINB12, FOLATE, FERRITIN, TIBC, IRON, RETICCTPCT in the last 72 hours. Sepsis Labs: No results for input(s): PROCALCITON, LATICACIDVEN in the  last 168 hours.  No results found for this or any previous visit (from the past 240 hour(s)).     Radiology Studies: Ct Head Wo Contrast  Result Date: 02/06/2016 CLINICAL DATA:  82 year old female with headache and neck pain following fall and head injury today. EXAM: CT HEAD WITHOUT CONTRAST CT CERVICAL SPINE WITHOUT CONTRAST TECHNIQUE: Multidetector CT imaging of the head and cervical spine was performed following the standard protocol without intravenous contrast. Multiplanar CT image reconstructions of the cervical spine were also generated. COMPARISON:  10/11/2005 cervical spine radiographs FINDINGS: CT HEAD FINDINGS Brain: No evidence of acute infarction, hemorrhage, hydrocephalus, extra-axial collection or mass lesion/mass effect. Atrophy and chronic small-vessel white matter ischemic changes noted. Vascular: Intracranial vascular calcifications noted. Skull: Normal. Negative for fracture or focal lesion. Sinuses/Orbits: No acute finding. Other: A posterior scalp hematoma is noted. CT CERVICAL SPINE FINDINGS No acute fracture, acute subluxation or prevertebral soft tissue swelling identified. Moderate multilevel degenerative disc disease and spondylosis noted. No suspicious focal bony lesions are identified. No soft tissue abnormalities are identified. The lung apices are clear. IMPRESSION: No evidence of acute intracranial abnormality. Atrophy and chronic small-vessel white matter ischemic changes. Posterior scalp hematoma without underlying fracture. No static evidence of acute injury to the cervical spine. Moderate multilevel degenerative changes. Electronically Signed   By: Margarette Canada M.D.   On: 02/06/2016 16:34   Ct Cervical Spine Wo Contrast  Result Date: 02/06/2016 CLINICAL DATA:  82 year old female with headache and neck pain following fall and head injury today. EXAM: CT HEAD WITHOUT CONTRAST CT CERVICAL SPINE WITHOUT CONTRAST TECHNIQUE: Multidetector CT imaging of the head and  cervical spine was performed following the standard protocol without intravenous contrast. Multiplanar CT image reconstructions of the cervical spine were also generated. COMPARISON:  10/11/2005 cervical spine radiographs FINDINGS: CT HEAD FINDINGS Brain: No evidence of acute infarction, hemorrhage, hydrocephalus, extra-axial collection or mass lesion/mass effect. Atrophy and chronic small-vessel white matter ischemic changes noted. Vascular: Intracranial vascular calcifications noted. Skull: Normal. Negative for fracture or focal lesion. Sinuses/Orbits: No acute finding. Other: A posterior scalp hematoma is noted. CT CERVICAL SPINE FINDINGS No acute fracture, acute subluxation or prevertebral soft tissue swelling identified. Moderate multilevel degenerative disc disease and spondylosis noted. No suspicious focal bony lesions are identified. No soft tissue abnormalities are identified. The lung apices are clear. IMPRESSION: No evidence of acute intracranial abnormality. Atrophy and chronic small-vessel white matter ischemic changes. Posterior scalp hematoma without underlying fracture. No static evidence of acute injury to the cervical spine. Moderate multilevel degenerative changes. Electronically Signed   By: Margarette Canada M.D.   On: 02/06/2016 16:34    Scheduled Meds: . acetaminophen  1,000 mg Oral TID  . latanoprost  1 drop Both Eyes QHS  . meclizine  12.5 mg  Oral TID  . morphine  15 mg Oral Q6H  . pantoprazole  40 mg Oral Daily  . timolol  1 drop Both Eyes BID   Continuous Infusions: . sodium chloride 100 mL/hr at 02/07/16 1752     LOS: 1 day    Time spent: > 35 minutes  Velvet Bathe, MD [facility] Pager [phone]  If 7PM-7AM, please contact night-coverage [email or web address] Password [email or web address] 02/08/2016, 4:11 PM

## 2016-02-08 NOTE — Progress Notes (Signed)
Pt began showing signs of withdrawal earlier this shift.  Questioned pt about medications and etoh use at home.  Pt states she takes MSIR 15mg  QID everyday for at least 3 years. Pt's PTA med list states she has an order for [facility] scheduled and she takes it scheduled.  Gave a dose of MSIR 15mg  po and MS 4mg  IV after speaking with the NP on call.  MSIR is now changed to scheduled and pt has had some relief from the Morphine given earlier.  Johanan Skorupski P Veida Spira

## 2016-02-09 MED ORDER — ASPIRIN EC 81 MG PO TBEC
81.0000 mg | DELAYED_RELEASE_TABLET | Freq: Every evening | ORAL | Status: DC
  Administered 2016-02-09: 81 mg via ORAL
  Filled 2016-02-09: qty 1

## 2016-02-09 NOTE — Clinical Social Work Placement (Signed)
   CLINICAL SOCIAL WORK PLACEMENT  NOTE  Date:  02/09/2016  Patient Details  Name: [name] MRN: [id number] Date of Birth: 08-Feb-1926  Clinical Social Work is seeking post-discharge placement for this patient at the [place] level of care (*CSW will initial, date and re-position this form in  chart as items are completed):  Yes   Patient/family provided with [facility]'s list of facilities offering this level of care within the geographic area requested by the patient (or if unable, by the patient's family).  Yes   Patient/family informed of their freedom to choose among providers that offer the needed level of care, that participate in Medicare, Medicaid or managed care program needed by the patient, have an available bed and are willing to accept the patient.  Yes   Patient/family informed of [facility]'s ownership interest in [facility] and [facility], as well as of the fact that they are under no obligation to receive care at these facilities.  PASRR submitted to EDS on 02/09/16     PASRR number received on       Existing PASRR number confirmed on 02/09/16     FL2 transmitted to all facilities in geographic area requested by pt/family on 02/09/16     FL2 transmitted to all facilities within larger geographic area on       Patient informed that his/her managed care company has contracts with or will negotiate with certain facilities, including the following:            Patient/family informed of bed offers received.  Patient chooses bed at       Physician recommends and patient chooses bed at      Patient to be transferred to   on  .  Patient to be transferred to facility by       Patient family notified on   of transfer.  Name of family member notified:        PHYSICIAN Please sign FL2     Additional Comment:    _______________________________________________ Glendon Axe A 02/09/2016, 10:43  AM

## 2016-02-09 NOTE — Progress Notes (Signed)
PROGRESS NOTE    [name]  BJY:[id number] DOB: February 02, 1926 DOA: 02/06/2016 PCP: Scarlette Calico, MD    Brief Narrative:  82 y.o. female with a past medical history significant for COPD, chronic pain from arthritis and scoliosis on 6 OMEs daily who presents with fall and head injury.  Assessment & Plan:   Principal Problem:   Concussion - CT of head reports no evidence of acute intracranial abnormality. CT of cervical spine reports no static evidence of acute injury to the cervical spine. - PT on board  Active Problems:   Osteoarthritis - continue supportive therapy  Dizziness - improving on trial of meclizine - Physical therapy to perform vestibular evaluation, awaiting results    Leukocytosis - resolved.  DVT prophylaxis: SCD Code Status: DNR Family Communication: Disposition Plan: pending evaluation by PT and trial of meclizine. Pt still c/o dizziness and nausea but improving. Most likely d/c next am.  Consultants:   None   Procedures: None   Antimicrobials: None   Subjective: Pt has no new complaints. Pt would like to start her aspirin again.  Objective: Vitals:   02/08/16 1400 02/08/16 2217 02/08/16 2324 02/09/16 0600  BP: (!) 116/40 (!) 170/57 (!) 152/58 (!) 159/57  Pulse: 65 (!) 55  (!) 56  Resp: 17 18  18   Temp: 98 F (36.7 C) 97.6 F (36.4 C)  98 F (36.7 C)  TempSrc: Oral Oral  Oral  SpO2: 95% 97%  98%  Weight:      Height:        Intake/Output Summary (Last 24 hours) at 02/09/16 1449 Last data filed at 02/09/16 1000  Gross per 24 hour  Intake             2480 ml  Output              400 ml  Net             2080 ml   Filed Weights   02/06/16 2358 02/07/16 0021  Weight: 50.8 kg (112 lb) 52.9 kg (116 lb 10 oz)    Examination:  General exam: Appears calm and comfortable, in nad. Respiratory system: Clear to auscultation. Respiratory effort normal. Cardiovascular system: S1 & S2 heard, RRR. No JVD, murmurs, rubs, gallops or  clicks. No pedal edema. Gastrointestinal system: Abdomen is nondistended, soft and nontender. No organomegaly or masses felt. Normal bowel sounds heard. Central nervous system: Alert and oriented. No focal neurological deficits. +horizontal nystagmus  Extremities: Symmetric 5 x 5 power. Skin: No rashes or ulcers on limited exam. Psychiatry: Judgement and insight appear normal. Mood & affect appropriate.   Data Reviewed: I have personally reviewed following labs and imaging studies  CBC:  Recent Labs Lab 02/06/16 1620 02/07/16 2305  WBC 20.2* 8.5  NEUTROABS 17.2*  --   HGB 12.5 10.4*  HCT 38.4 31.0*  MCV 88.7 86.1  PLT 286 086   Basic Metabolic Panel:  Recent Labs Lab 02/06/16 1620 02/07/16 2305  NA 139 138  K 3.5 3.4*  CL 109 113*  CO2 22 18*  GLUCOSE 102* 105*  BUN 16 12  CREATININE 0.85 0.82  CALCIUM 9.7 8.5*   GFR: Estimated Creatinine Clearance: 37.5 mL/min (by C-G formula based on SCr of 0.82 mg/dL). Liver Function Tests:  Recent Labs Lab 02/06/16 1620  AST 27  ALT 18  ALKPHOS 57  BILITOT 0.5  PROT 7.2  ALBUMIN 3.8   No results for input(s): LIPASE, AMYLASE in the last 168 hours.  No results for input(s): AMMONIA in the last 168 hours. Coagulation Profile: No results for input(s): INR, PROTIME in the last 168 hours. Cardiac Enzymes: No results for input(s): CKTOTAL, CKMB, CKMBINDEX, TROPONINI in the last 168 hours. BNP (last 3 results) No results for input(s): PROBNP in the last 8760 hours. HbA1C: No results for input(s): HGBA1C in the last 72 hours. CBG:  Recent Labs Lab 02/06/16 1539  GLUCAP 113*   Lipid Profile: No results for input(s): CHOL, HDL, LDLCALC, TRIG, CHOLHDL, LDLDIRECT in the last 72 hours. Thyroid Function Tests: No results for input(s): TSH, T4TOTAL, FREET4, T3FREE, THYROIDAB in the last 72 hours. Anemia Panel: No results for input(s): VITAMINB12, FOLATE, FERRITIN, TIBC, IRON, RETICCTPCT in the last 72 hours. Sepsis  Labs: No results for input(s): PROCALCITON, LATICACIDVEN in the last 168 hours.  No results found for this or any previous visit (from the past 240 hour(s)).     Radiology Studies: No results found.  Scheduled Meds: . acetaminophen  1,000 mg Oral TID  . latanoprost  1 drop Both Eyes QHS  . meclizine  12.5 mg Oral TID  . morphine  15 mg Oral Q6H  . pantoprazole  40 mg Oral Daily  . timolol  1 drop Both Eyes BID   Continuous Infusions: . sodium chloride 100 mL/hr at 02/09/16 0227     LOS: 2 days    Time spent: > 35 minutes  Velvet Bathe, MD [facility] Pager [phone]  If 7PM-7AM, please contact night-coverage [email or web address] Password [email or web address] 02/09/2016, 2:49 PM

## 2016-02-09 NOTE — NC FL2 (Signed)
[place] LEVEL OF CARE SCREENING TOOL     IDENTIFICATION  Patient Name: [name] Birthdate: 1925-06-16 Sex: female Admission Date (Current Location): 02/06/2016  [facility] and [place] Number:  Herbalist and Address:  [facility],  [place] [address] [place]      Provider Number: [id number]  Attending Physician Name and Address:  Velvet Bathe, MD  Relative Name and Phone Number:       Current Level of Care: Hospital Recommended Level of Care: [place] Prior Approval Number:    Date Approved/Denied:   PASRR Number:   [id number]   Discharge Plan: SNF    Current Diagnoses: Patient Active Problem List   Diagnosis Date Noted  . Concussion 02/06/2016  . Leukocytosis 02/06/2016  . Hyperlipidemia with target LDL less than 160 09/24/2014  . Routine general medical examination at a health care facility 09/24/2014  . MACULAR DEGENERATION 08/07/2008  . GLAUCOMA 08/07/2008  . INSOMNIA 08/07/2008  . Constipation 07/22/2008  . NEURALGIA, TRIGEMINAL 01/07/2008  . Allergic rhinitis 09/03/2007  . COPD 09/03/2007  . GERD 09/03/2007  . Osteoarthritis 09/03/2007    Orientation RESPIRATION BLADDER Height & Weight     Self, Time, Situation, Place  Normal Continent Weight: 116 lb 10 oz (52.9 kg) Height:  5\' 2"  (157.5 cm)  BEHAVIORAL SYMPTOMS/MOOD NEUROLOGICAL BOWEL NUTRITION STATUS   (NONE )  ( NONE ) Continent Diet (REGULAR )  AMBULATORY STATUS COMMUNICATION OF NEEDS Skin   Extensive Assist Verbally Normal                       Personal Care Assistance Level of Assistance  Bathing, Dressing, Feeding Bathing Assistance: Limited assistance Feeding assistance: Independent Dressing Assistance: Limited assistance     Functional Limitations Info  Speech, Hearing, Sight Sight Info: Adequate Hearing Info: Adequate Speech Info: Adequate    SPECIAL CARE FACTORS FREQUENCY  PT (By licensed PT)     PT  Frequency: 3              Contractures      Additional Factors Info  Code Status, Allergies Code Status Info: DNR  Allergies Info: Codeine, Pregabalin, Promethazine Hcl, Sulfonamide Derivatives           Current Medications (02/09/2016):  This is the current hospital active medication list Current Facility-Administered Medications  Medication Dose Route Frequency Provider Last Rate Last Dose  . 0.9 %  sodium chloride infusion   Intravenous Continuous Edwin Dada, MD 100 mL/hr at 02/09/16 0227    . acetaminophen (TYLENOL) tablet 1,000 mg  1,000 mg Oral TID Edwin Dada, MD   1,000 mg at 02/09/16 1041  . bisacodyl (DULCOLAX) suppository 10 mg  10 mg Rectal Daily PRN Edwin Dada, MD      . ibuprofen (ADVIL,MOTRIN) tablet 400 mg  400 mg Oral Q6H PRN Edwin Dada, MD      . latanoprost (XALATAN) 0.005 % ophthalmic solution 1 drop  1 drop Both Eyes QHS Velvet Bathe, MD   1 drop at 02/08/16 2235  . meclizine (ANTIVERT) tablet 12.5 mg  12.5 mg Oral TID Velvet Bathe, MD   12.5 mg at 02/09/16 1041  . morphine (MSIR) tablet 15 mg  15 mg Oral Q6H Rhetta Mura Schorr, NP   15 mg at 02/09/16 2992  . morphine 2 MG/ML injection 2-4 mg  2-4 mg Intravenous Q4H PRN Edwin Dada, MD   4 mg  at 02/08/16 0527  . ondansetron (ZOFRAN) tablet 4 mg  4 mg Oral Q6H PRN Edwin Dada, MD       Or  . ondansetron (ZOFRAN) injection 4 mg  4 mg Intravenous Q6H PRN Edwin Dada, MD   4 mg at 02/07/16 2150  . pantoprazole (PROTONIX) EC tablet 40 mg  40 mg Oral Daily Edwin Dada, MD   40 mg at 02/09/16 1041  . prochlorperazine (COMPAZINE) injection 5 mg  5 mg Intravenous Q6H PRN Edwin Dada, MD   5 mg at 02/07/16 0045  . senna-docusate (Senokot-S) tablet 1 tablet  1 tablet Oral QHS PRN Edwin Dada, MD      . timolol (TIMOPTIC) 0.5 % ophthalmic solution 1 drop  1 drop Both Eyes BID Velvet Bathe, MD   1 drop at 02/09/16 1042   . traZODone (DESYREL) tablet 50 mg  50 mg Oral QHS PRN Edwin Dada, MD   50 mg at 02/07/16 2150     Discharge Medications: Please see discharge summary for a list of discharge medications.  Relevant Imaging Results:  Relevant Lab Results:   Additional Information SSN [id number]   Glendon Axe, MSW [phone] 02/09/2016 10:48 AM

## 2016-02-09 NOTE — Progress Notes (Signed)
Physical Therapy Treatment Patient Details Name: [name] MRN: [id number] DOB: 11/07/1925 Today's Date: 02/09/2016    History of Present Illness 82 yo female admitted with concussion, posterior scalp hematoma after falling at home. Hx of COPD, OA, chronic pain, macular degeneration, scoliosis    PT Comments    Pt re-assessed using Dix-Hallpike and continues to remain positive for bilateral upbeating rotational nystagmus.   However pt reports spinning less severe and nystagmus only 10 seconds bilaterally today.  Performed CRT maneuver for involvement of right posterior canal.  Pt then able to ambulate in hallway and reports no dizziness with mobility after maneuver however nystagmus still observed upon returning to supine.  Pt will continue to need vestibular rehab for assessment and treatment.  Plan is for SNF upon d/c however also left pt with outpatient vestibular PT referral handout in case symptoms persist.   Follow Up Recommendations  SNF (recommend PT familiar with vestibular)     Equipment Recommendations  Rolling walker with 5" wheels    Recommendations for Other Services       Precautions / Restrictions Precautions Precautions: Fall    Mobility  Bed Mobility Overal bed mobility: Needs Assistance Bed Mobility: Rolling;Sidelying to Sit;Supine to Sit;Sit to Supine;Sit to Sidelying Rolling: Modified independent (Device/Increase time) Sidelying to sit: Mod assist Supine to sit: Mod assist Sit to supine: Mod assist Sit to sidelying: Mod assist General bed mobility comments: requiring more assist for trunk today, reports some back pain (states chronic however more aggravated after fall) so also more assist provided for less stress to spine  Transfers Overall transfer level: Needs assistance Equipment used: Rolling walker (2 wheeled) Transfers: Sit to/from Stand Sit to Stand: Min guard         General transfer comment: verbal cues for hand  placement  Ambulation/Gait Ambulation/Gait assistance: Min guard Ambulation Distance (Feet): 120 Feet Assistive device: Rolling walker (2 wheeled) Gait Pattern/deviations: Step-through pattern     General Gait Details: verbal cues for RW positioning, cues for focusing on distant stationary object at eye level, reports no dizziness today with gait   Stairs            Wheelchair Mobility    Modified Rankin (Stroke Patients Only)       Balance                                    Cognition Arousal/Alertness: Awake/alert Behavior During Therapy: WFL for tasks assessed/performed Overall Cognitive Status: Within Functional Limits for tasks assessed                      Exercises      General Comments        Pertinent Vitals/Pain Pain Assessment: 0-10 Pain Score: 7  Pain Location: R hip when laying on R side per pt (better with ambulating) Pain Descriptors / Indicators: Sore Pain Intervention(s): Limited activity within patient's tolerance;Monitored during session;Repositioned    Home Living                      Prior Function            PT Goals (current goals can now be found in the care plan section) Progress towards PT goals: Progressing toward goals    Frequency    Min 3X/week      PT Plan Current plan remains appropriate    Co-evaluation  End of Session Equipment Utilized During Treatment: Gait belt Activity Tolerance: Patient tolerated treatment well Patient left: in bed;with call bell/phone within reach;with family/visitor present;with bed alarm set     Time: 1110-1142 PT Time Calculation (min) (ACUTE ONLY): 32 min  Charges:  $Gait Training: 8-22 mins $Canalith Rep Proc: 8-22 mins                    G Codes:      Lexandra Rettke,KATHrine E 2016/02/16, 12:58 PM Carmelia Bake, PT, DPT 2016-02-16 Pager: [phone]

## 2016-02-09 NOTE — Clinical Social Work Note (Signed)
Clinical Social Work Assessment  Patient Details  Name: [name] MRN: [id number] Date of Birth: 08-May-1925  Date of referral:  02/09/16               Reason for consult:  Facility Placement, Discharge Planning                Permission sought to share information with:  Family Supports, Customer service manager, Case Manager Permission granted to share information::  Yes, Verbal Permission Granted  Name::      [name] [facility] and Toniann Ket )  Agency::   (SNF's )  Relationship::   (Sister and Son )  Sport and exercise psychologist Information:   [phone] and/or [phone])  Housing/Transportation Living arrangements for the past 2 months:  Single Family Home Source of Information:  Patient, Adult Children (Sibling ) Patient Interpreter Needed:  None Criminal Activity/Legal Involvement Pertinent to Current Situation/Hospitalization:  No - Comment as needed Significant Relationships:  Adult Children, Siblings Lives with:  Self Do you feel safe going back to the place where you live?  No Need for family participation in patient care:  Yes (Comment)  Care giving concerns:  Patient admitted from home alone. PT recommending ST SNF placement.    Social Worker assessment / plan:  MSW met with patient in reference to post-acute placement for SNF. MSW introduced MSW role and SNF process. Patient indicated that she has been a ST resident of [facility] in he past and enjoyed the therapy. Pt's sister, [name] reported that patient will be selling her home and moving into an ALF- [facility] in [place] in mid to late November. Pt's son confirmed plans.  Patient and family requesting private room at [facility] and patient would like someone to stay with her from group "[facility]". Patient and family agreeable to placement at [facility] in the event [facility] does not have private room. No further concerns reported at this time. MSW will continue to follow pt and family for  continued support and to facilitate pt's dc needs once stable.   Employment status:  Retired Forensic scientist:  Medicare PT Recommendations:  [place] / Referral to community resources:  [place]  Patient/Family's Response to care:  Pt a/o x4. Pt and family agreeable to SNF placement at [facility] or [facility]. Pt and family pleasant and appreciated social work intervention.   Patient/Family's Understanding of and Emotional Response to Diagnosis, Current Treatment, and Prognosis:  Pt and family aware of medica interventions and discharge planning.   Emotional Assessment Appearance:  Appears stated age Attitude/Demeanor/Rapport:   (Pleasant ) Affect (typically observed):  Accepting, Appropriate, Pleasant Orientation:  Oriented to Situation, Oriented to  Time, Oriented to Place, Oriented to Self Alcohol / Substance use:  Not Applicable Psych involvement (Current and /or in the community):  No (Comment)  Discharge Needs  Concerns to be addressed:  Denies Needs/Concerns at this time Readmission within the last 30 days:  No Current discharge risk:  Dependent with Mobility, Lives alone Barriers to Discharge:  Continued Medical Work up  [employer], MSW [phone] 02/09/2016 10:43 AM

## 2016-02-10 DIAGNOSIS — S060X9S Concussion with loss of consciousness of unspecified duration, sequela: Secondary | ICD-10-CM | POA: Diagnosis not present

## 2016-02-10 DIAGNOSIS — Z9181 History of falling: Secondary | ICD-10-CM | POA: Diagnosis not present

## 2016-02-10 DIAGNOSIS — S7002XD Contusion of left hip, subsequent encounter: Secondary | ICD-10-CM | POA: Diagnosis not present

## 2016-02-10 DIAGNOSIS — R531 Weakness: Secondary | ICD-10-CM | POA: Diagnosis not present

## 2016-02-10 DIAGNOSIS — K219 Gastro-esophageal reflux disease without esophagitis: Secondary | ICD-10-CM | POA: Diagnosis not present

## 2016-02-10 DIAGNOSIS — K5903 Drug induced constipation: Secondary | ICD-10-CM | POA: Diagnosis not present

## 2016-02-10 DIAGNOSIS — H409 Unspecified glaucoma: Secondary | ICD-10-CM | POA: Diagnosis not present

## 2016-02-10 DIAGNOSIS — S060X9A Concussion with loss of consciousness of unspecified duration, initial encounter: Secondary | ICD-10-CM | POA: Diagnosis not present

## 2016-02-10 DIAGNOSIS — M545 Low back pain: Secondary | ICD-10-CM | POA: Diagnosis not present

## 2016-02-10 DIAGNOSIS — D638 Anemia in other chronic diseases classified elsewhere: Secondary | ICD-10-CM | POA: Diagnosis not present

## 2016-02-10 DIAGNOSIS — G8929 Other chronic pain: Secondary | ICD-10-CM | POA: Diagnosis not present

## 2016-02-10 DIAGNOSIS — R2689 Other abnormalities of gait and mobility: Secondary | ICD-10-CM | POA: Diagnosis not present

## 2016-02-10 DIAGNOSIS — R41841 Cognitive communication deficit: Secondary | ICD-10-CM | POA: Diagnosis not present

## 2016-02-10 DIAGNOSIS — T402X5A Adverse effect of other opioids, initial encounter: Secondary | ICD-10-CM | POA: Diagnosis not present

## 2016-02-10 DIAGNOSIS — H8113 Benign paroxysmal vertigo, bilateral: Secondary | ICD-10-CM | POA: Diagnosis not present

## 2016-02-10 DIAGNOSIS — S060X0D Concussion without loss of consciousness, subsequent encounter: Secondary | ICD-10-CM | POA: Diagnosis not present

## 2016-02-10 DIAGNOSIS — R42 Dizziness and giddiness: Secondary | ICD-10-CM | POA: Diagnosis not present

## 2016-02-10 DIAGNOSIS — S060X0A Concussion without loss of consciousness, initial encounter: Secondary | ICD-10-CM | POA: Diagnosis not present

## 2016-02-10 DIAGNOSIS — S060X0S Concussion without loss of consciousness, sequela: Secondary | ICD-10-CM | POA: Diagnosis not present

## 2016-02-10 DIAGNOSIS — R1312 Dysphagia, oropharyngeal phase: Secondary | ICD-10-CM | POA: Diagnosis not present

## 2016-02-10 DIAGNOSIS — G4700 Insomnia, unspecified: Secondary | ICD-10-CM | POA: Diagnosis not present

## 2016-02-10 DIAGNOSIS — R2681 Unsteadiness on feet: Secondary | ICD-10-CM | POA: Diagnosis not present

## 2016-02-10 DIAGNOSIS — E876 Hypokalemia: Secondary | ICD-10-CM | POA: Diagnosis not present

## 2016-02-10 DIAGNOSIS — R278 Other lack of coordination: Secondary | ICD-10-CM | POA: Diagnosis not present

## 2016-02-10 DIAGNOSIS — D649 Anemia, unspecified: Secondary | ICD-10-CM | POA: Diagnosis not present

## 2016-02-10 DIAGNOSIS — K5909 Other constipation: Secondary | ICD-10-CM | POA: Diagnosis not present

## 2016-02-10 DIAGNOSIS — M549 Dorsalgia, unspecified: Secondary | ICD-10-CM | POA: Diagnosis not present

## 2016-02-10 DIAGNOSIS — R259 Unspecified abnormal involuntary movements: Secondary | ICD-10-CM | POA: Diagnosis not present

## 2016-02-10 MED ORDER — MECLIZINE HCL 12.5 MG PO TABS: 12.5000 mg | ORAL_TABLET | Freq: Three times a day (TID) | ORAL | 0 refills | Status: DC

## 2016-02-10 MED ORDER — TRAZODONE HCL 50 MG PO TABS: 50.0000 mg | ORAL_TABLET | Freq: Every evening | ORAL | 0 refills | Status: DC | PRN

## 2016-02-10 MED ORDER — SENNOSIDES-DOCUSATE SODIUM 8.6-50 MG PO TABS: 1.0000 | ORAL_TABLET | Freq: Two times a day (BID) | ORAL | Status: DC

## 2016-02-10 MED ORDER — MORPHINE SULFATE 15 MG PO TABS: 15.0000 mg | ORAL_TABLET | Freq: Four times a day (QID) | ORAL | 0 refills | Status: DC

## 2016-02-10 MED ORDER — ACETAMINOPHEN 500 MG PO TABS: 1000.0000 mg | ORAL_TABLET | Freq: Four times a day (QID) | ORAL | 0 refills | Status: DC | PRN

## 2016-02-10 MED ORDER — BISACODYL 10 MG RE SUPP: 10.0000 mg | Freq: Every day | RECTAL | 0 refills | Status: DC | PRN

## 2016-02-10 NOTE — Progress Notes (Signed)
Physical Therapy Treatment Patient Details Name: [name] MRN: [id number] DOB: [birth year] Today's Date: 02/10/2016    History of Present Illness 82 yo female admitted with concussion, posterior scalp hematoma after falling at home. Hx of COPD, OA, chronic pain, macular degeneration, scoliosis    PT Comments    Pt reports feeling much better today.  Pt reports occasional dizziness last night and this morning however much improved since admission.  Performed bil Marye Round again and pt reports no symptoms, however did observe minimal nystagmus (difficult to see direction) and only approx 6 seconds duration each side.  Since pt did not report symptoms, deferred canalith repositioning.  Pt also tolerated ambulation well without dizziness.   Follow Up Recommendations  SNF (recommend PT familiar with vestibular)     Equipment Recommendations  Rolling walker with 5" wheels    Recommendations for Other Services       Precautions / Restrictions Precautions Precautions: Fall    Mobility  Bed Mobility Overal bed mobility: Needs Assistance Bed Mobility: Supine to Sit;Sit to Supine     Supine to sit: Min assist Sit to supine: Min guard   General bed mobility comments: requiring assist for trunk, reports some back pain (states chronic however more aggravated after fall) so also more assist provided for less stress to spine  Transfers Overall transfer level: Needs assistance Equipment used: Rolling walker (2 wheeled) Transfers: Sit to/from Stand Sit to Stand: Min guard         General transfer comment: verbal cues for hand placement  Ambulation/Gait Ambulation/Gait assistance: Min guard Ambulation Distance (Feet): 120 Feet Assistive device: Rolling walker (2 wheeled) Gait Pattern/deviations: Step-through pattern     General Gait Details: tolerating ambulation well today, no dizziness, occasional cue to focusing on stationary object   Stairs             Wheelchair Mobility    Modified Rankin (Stroke Patients Only)       Balance                                    Cognition Arousal/Alertness: Awake/alert Behavior During Therapy: WFL for tasks assessed/performed Overall Cognitive Status: Within Functional Limits for tasks assessed                      Exercises      General Comments        Pertinent Vitals/Pain Pain Assessment: 0-10 Pain Score: 6  Pain Location: back, "sore all over" Pain Descriptors / Indicators: Sore Pain Intervention(s): Limited activity within patient's tolerance;Monitored during session    Home Living                      Prior Function            PT Goals (current goals can now be found in the care plan section) Progress towards PT goals: Progressing toward goals    Frequency    Min 3X/week      PT Plan Current plan remains appropriate    Co-evaluation             End of Session   Activity Tolerance: Patient tolerated treatment well Patient left: in bed;with call bell/phone within reach;with family/visitor present     Time: 5573-2202 PT Time Calculation (min) (ACUTE ONLY): 17 min  Charges:  $Gait Training: 8-22 mins  G Codes:      [name],[name] 07-Mar-2016, 12:54 PM Carmelia Bake, PT, DPT 03-07-2016 Pager: [phone]

## 2016-02-10 NOTE — Discharge Instructions (Signed)
Concussion, Adult °A concussion is a brain injury. It is caused by: °· A hit to the head. °· A quick and sudden movement (jolt) of the head or neck. °A concussion is usually not life threatening. Even so, it can cause serious problems. If you had a concussion before, you may have concussion-like problems after a hit to your head. °HOME CARE °General Instructions °· Follow your doctor's directions carefully. °· Take medicines only as told by your doctor. °· Only take medicines your doctor says are safe. °· Do not drink alcohol until your doctor says it is okay. Alcohol and some drugs can slow down healing. They can also put you at risk for further injury. °· If you are having trouble remembering things, write them down. °· Try to do one thing at a time if you get distracted easily. For example, do not watch TV while making dinner. °· Talk to your family members or close friends when making important decisions. °· Follow up with your doctor as told. °· Watch your symptoms. Tell others to do the same. Serious problems can sometimes happen after a concussion. Older adults are more likely to have these problems. °· Tell your teachers, school nurse, school counselor, coach, athletic trainer, or work manager about your concussion. Tell them about what you can or cannot do. They should watch to see if: °¨ It gets even harder for you to pay attention or concentrate. °¨ It gets even harder for you to remember things or learn new things. °¨ You need more time than normal to finish things. °¨ You become annoyed (irritable) more than before. °¨ You are not able to deal with stress as well. °¨ You have more problems than before. °· Rest. Make sure you: °¨ Get plenty of sleep at night. °¨ Go to sleep early. °¨ Go to bed at the same time every day. Try to wake up at the same time. °¨ Rest during the day. °¨ Take naps when you feel tired. °· Limit activities where you have to think a lot or concentrate. These include: °¨ Doing  homework. °¨ Doing work related to a job. °¨ Watching TV. °¨ Using the computer. °Returning To Your Regular Activities °Return to your normal activities slowly, not all at once. You must give your body and brain enough time to heal.  °· Do not play sports or do other athletic activities until your doctor says it is okay. °· Ask your doctor when you can drive, ride a bicycle, or work other vehicles or machines. Never do these things if you feel dizzy. °· Ask your doctor about when you can return to work or school. °Preventing Another Concussion °It is very important to avoid another brain injury, especially before you have healed. In rare cases, another injury can lead to permanent brain damage, brain swelling, or death. The risk of this is greatest during the first 7-10 days after your injury. Avoid injuries by:  °· Wearing a seat belt when riding in a car. °· Not drinking too much alcohol. °· Avoiding activities that could lead to a second concussion (such as contact sports). °· Wearing a helmet when doing activities like: °¨ Biking. °¨ Skiing. °¨ Skateboarding. °¨ Skating. °· Making your home safer by: °¨ Removing things from the floor or stairways that could make you trip. °¨ Using grab bars in bathrooms and handrails by stairs. °¨ Placing non-slip mats on floors and in bathtubs. °¨ Improve lighting in dark areas. °GET HELP IF: °·   It gets even harder for you to pay attention or concentrate. °· It gets even harder for you to remember things or learn new things. °· You need more time than normal to finish things. °· You become annoyed (irritable) more than before. °· You are not able to deal with stress as well. °· You have more problems than before. °· You have problems keeping your balance. °· You are not able to react quickly when you should. °Get help if you have any of these problems for more than 2 weeks:  °· Lasting (chronic) headaches. °· Dizziness or trouble balancing. °· Feeling sick to your stomach  (nausea). °· Seeing (vision) problems. °· Being affected by noises or light more than normal. °· Feeling sad, low, down in the dumps, blue, gloomy, or empty (depressed). °· Mood changes (mood swings). °· Feeling of fear or nervousness about what may happen (anxiety). °· Feeling annoyed. °· Memory problems. °· Problems concentrating or paying attention. °· Sleep problems. °· Feeling tired all the time. °GET HELP RIGHT AWAY IF:  °· You have bad headaches or your headaches get worse. °· You have weakness (even if it is in one hand, leg, or part of the face). °· You have loss of feeling (numbness). °· You feel off balance. °· You keep throwing up (vomiting). °· You feel tired. °· One black center of your eye (pupil) is larger than the other. °· You twitch or shake violently (convulse). °· Your speech is not clear (slurred). °· You are more confused, easily angered (agitated), or annoyed than before. °· You have more trouble resting than before. °· You are unable to recognize people or places. °· You have neck pain. °· It is difficult to wake you up. °· You have unusual behavior changes. °· You pass out (lose consciousness). °MAKE SURE YOU:  °· Understand these instructions. °· Will watch your condition. °· Will get help right away if you are not doing well or get worse. °  °This information is not intended to replace advice given to you by your health care provider. Make sure you discuss any questions you have with your health care provider. °  °Document Released: 03/30/2009 Document Revised: 05/02/2014 Document Reviewed: 11/01/2012 °Elsevier Interactive Patient Education ©2016 [employer] ° °

## 2016-02-10 NOTE — Discharge Summary (Signed)
[facility]  Physician Discharge Summary   Patient ID: [name] MRN: [id number] DOB/AGE: Sep 03, 1925 82 y.o.  Admit date: 02/06/2016 Discharge date: 02/10/2016  PCP: Scarlette Calico, MD  DISCHARGE DIAGNOSES:  Principal Problem:   Concussion Active Problems:   Osteoarthritis   Leukocytosis   RECOMMENDATIONS FOR OUTPATIENT FOLLOW UP: 1. CBC and basic metabolic panel early next week  2. Monitor blood pressures closely. See below   DISCHARGE CONDITION: fair  Diet recommendation: Low sodium  Filed Weights   02/06/16 2358 02/07/16 0021  Weight: 50.8 kg (112 lb) 52.9 kg (116 lb 10 oz)    INITIAL HISTORY: 82 y.o.femalewith a past medical history significant for COPD, chronic pain from arthritis and scoliosis on oral morphine dailywho presented with fall and head injury. No intracranial abnormalities were noted on CT scan. Patient was thought to have Concussion. She had vertiginous symptoms. She was hospitalized for further management.  Consultations:  None  Procedures:  None  HOSPITAL COURSE:   Concussion CT of head reports no evidence of acute intracranial abnormality. CT of cervical spine reports no static evidence of acute injury to the cervical spine. Patient's pain levels have significantly improved. Patient was seen by physical therapy. She is thought to require rehabilitation. Agree with this assessment.   Dizziness/Vertiginous symptoms Most likely secondary to concussion. Patient was started on meclizine. She has improved. Continue meclizine for now. Should be able to change it to as needed in the next few days. Continue physical therapy for vestibular rehabilitation.  Osteoarthritis with severe chronic pain Patient mentions that she has severe arthritis. She also has chronic back pain. She is on oral morphine at home for pain control. This will be continued. This is managed by a pain management specialist as an  outpatient.  Leukocytosis Resolved. Likely secondary to stress.  Elevated blood pressures. Patient concerned about her elevated BP. Explained to her that this could be elevated due to her pain. Would recommend monitoring this closely at the skilled nursing facility. If blood pressure remains elevated despite adequate control of pain, she may benefit from an antihypertensive agent.   Overall, stable and improved. Okay for discharge to skilled nursing facility today.  PERTINENT LABS:  The results of significant diagnostics from this hospitalization (including imaging, microbiology, ancillary and laboratory) are listed below for reference.     Labs: Basic Metabolic Panel:  Recent Labs Lab 02/06/16 1620 02/07/16 2305  NA 139 138  K 3.5 3.4*  CL 109 113*  CO2 22 18*  GLUCOSE 102* 105*  BUN 16 12  CREATININE 0.85 0.82  CALCIUM 9.7 8.5*   Liver Function Tests:  Recent Labs Lab 02/06/16 1620  AST 27  ALT 18  ALKPHOS 57  BILITOT 0.5  PROT 7.2  ALBUMIN 3.8   CBC:  Recent Labs Lab 02/06/16 1620 02/07/16 2305  WBC 20.2* 8.5  NEUTROABS 17.2*  --   HGB 12.5 10.4*  HCT 38.4 31.0*  MCV 88.7 86.1  PLT 286 229    CBG:  Recent Labs Lab 02/06/16 1539  GLUCAP 113*     IMAGING STUDIES Ct Head Wo Contrast  Result Date: 02/06/2016 CLINICAL DATA:  82 year old female with headache and neck pain following fall and head injury today. EXAM: CT HEAD WITHOUT CONTRAST CT CERVICAL SPINE WITHOUT CONTRAST TECHNIQUE: Multidetector CT imaging of the head and cervical spine was performed following the standard protocol without intravenous contrast. Multiplanar CT image reconstructions of the cervical spine were also generated. COMPARISON:  10/11/2005 cervical spine radiographs  FINDINGS: CT HEAD FINDINGS Brain: No evidence of acute infarction, hemorrhage, hydrocephalus, extra-axial collection or mass lesion/mass effect. Atrophy and chronic small-vessel white matter ischemic changes  noted. Vascular: Intracranial vascular calcifications noted. Skull: Normal. Negative for fracture or focal lesion. Sinuses/Orbits: No acute finding. Other: A posterior scalp hematoma is noted. CT CERVICAL SPINE FINDINGS No acute fracture, acute subluxation or prevertebral soft tissue swelling identified. Moderate multilevel degenerative disc disease and spondylosis noted. No suspicious focal bony lesions are identified. No soft tissue abnormalities are identified. The lung apices are clear. IMPRESSION: No evidence of acute intracranial abnormality. Atrophy and chronic small-vessel white matter ischemic changes. Posterior scalp hematoma without underlying fracture. No static evidence of acute injury to the cervical spine. Moderate multilevel degenerative changes. Electronically Signed   By: Margarette Canada M.D.   On: 02/06/2016 16:34   Ct Cervical Spine Wo Contrast  Result Date: 02/06/2016 CLINICAL DATA:  82 year old female with headache and neck pain following fall and head injury today. EXAM: CT HEAD WITHOUT CONTRAST CT CERVICAL SPINE WITHOUT CONTRAST TECHNIQUE: Multidetector CT imaging of the head and cervical spine was performed following the standard protocol without intravenous contrast. Multiplanar CT image reconstructions of the cervical spine were also generated. COMPARISON:  10/11/2005 cervical spine radiographs FINDINGS: CT HEAD FINDINGS Brain: No evidence of acute infarction, hemorrhage, hydrocephalus, extra-axial collection or mass lesion/mass effect. Atrophy and chronic small-vessel white matter ischemic changes noted. Vascular: Intracranial vascular calcifications noted. Skull: Normal. Negative for fracture or focal lesion. Sinuses/Orbits: No acute finding. Other: A posterior scalp hematoma is noted. CT CERVICAL SPINE FINDINGS No acute fracture, acute subluxation or prevertebral soft tissue swelling identified. Moderate multilevel degenerative disc disease and spondylosis noted. No suspicious focal  bony lesions are identified. No soft tissue abnormalities are identified. The lung apices are clear. IMPRESSION: No evidence of acute intracranial abnormality. Atrophy and chronic small-vessel white matter ischemic changes. Posterior scalp hematoma without underlying fracture. No static evidence of acute injury to the cervical spine. Moderate multilevel degenerative changes. Electronically Signed   By: Margarette Canada M.D.   On: 02/06/2016 16:34    DISCHARGE EXAMINATION: Vitals:   02/09/16 0600 02/09/16 1400 02/09/16 2122 02/10/16 0532  BP: (!) 159/57 (!) 168/68 (!) 147/57 (!) 175/52  Pulse: (!) 56 65 60 60  Resp: 18 17 16 16   Temp: 98 F (36.7 C) 98.2 F (36.8 C) 98.8 F (37.1 C) 98.6 F (37 C)  TempSrc: Oral Oral Oral Oral  SpO2: 98% 95% 99% 100%  Weight:      Height:       General appearance: alert, cooperative, appears stated age and no distress Resp: clear to auscultation bilaterally Cardio: regular rate and rhythm, S1, S2 normal, no murmur, click, rub or gallop GI: soft, non-tender; bowel sounds normal; no masses,  no organomegaly Extremities: Bruising noted in the lower back. She has good range of motion of all joints of both legs .   DISPOSITION: SNF  Discharge Instructions    Call MD for:  difficulty breathing, headache or visual disturbances    Complete by:  As directed    Call MD for:  extreme fatigue    Complete by:  As directed    Call MD for:  persistant dizziness or light-headedness    Complete by:  As directed    Call MD for:  persistant nausea and vomiting    Complete by:  As directed    Call MD for:  severe uncontrolled pain    Complete by:  As  directed    Call MD for:  temperature >100.4    Complete by:  As directed    Diet - low sodium heart healthy    Complete by:  As directed    Discharge instructions    Complete by:  As directed    CBC and basic metabolic panel early next week. Monitor blood pressures closely. If remains elevated, may benefit from  initiating antihypertensives.  You were cared for by a hospitalist during your hospital stay. If you have any questions about your discharge medications or the care you received while you were in the hospital after you are discharged, you can call the unit and asked to speak with the hospitalist on call if the hospitalist that took care of you is not available. Once you are discharged, your primary care physician will handle any further medical issues. Please note that NO REFILLS for any discharge medications will be authorized once you are discharged, as it is imperative that you return to your primary care physician (or establish a relationship with a primary care physician if you do not have one) for your aftercare needs so that they can reassess your need for medications and monitor your lab values. If you do not have a primary care physician, you can call [phone] for a physician referral.   Increase activity slowly    Complete by:  As directed       ALLERGIES:  Allergies  Allergen Reactions  . Codeine Itching and Nausea Only    Small amounts okay  . Pregabalin Other (See Comments)    Confusion and hallucination  . Promethazine Hcl Other (See Comments)    Muscle cramps  . Sulfonamide Derivatives Nausea Only     Current Discharge Medication List    START taking these medications   Details  acetaminophen (TYLENOL) 500 MG tablet Take 2 tablets (1,000 mg total) by mouth every 6 (six) hours as needed. Qty: 30 tablet, Refills: 0    bisacodyl (DULCOLAX) 10 MG suppository Place 1 suppository (10 mg total) rectally daily as needed for moderate constipation. Qty: 12 suppository, Refills: 0    meclizine (ANTIVERT) 12.5 MG tablet Take 1 tablet (12.5 mg total) by mouth 3 (three) times daily. Qty: 30 tablet, Refills: 0    senna-docusate (SENOKOT-S) 8.6-50 MG tablet Take 1 tablet by mouth 2 (two) times daily.      CONTINUE these medications which have CHANGED   Details  morphine (MSIR)  15 MG tablet Take 1 tablet (15 mg total) by mouth every 6 (six) hours. Qty: 30 tablet, Refills: 0    traZODone (DESYREL) 50 MG tablet Take 1 tablet (50 mg total) by mouth at bedtime as needed for sleep. Qty: 30 tablet, Refills: 0      CONTINUE these medications which have NOT CHANGED   Details  aspirin EC 81 MG tablet Take 81 mg by mouth every evening.     beta carotene w/minerals (OCUVITE) tablet Take 1 tablet by mouth 2 (two) times daily.    omeprazole (PRILOSEC) 20 MG capsule Take 20 mg by mouth every morning.     timolol (BETIMOL) 0.5 % ophthalmic solution Place 1 drop into both eyes 2 (two) times daily.     travoprost, benzalkonium, (TRAVATAN) 0.004 % ophthalmic solution Place 1 drop into both eyes at bedtime.          Follow-up Information    Scarlette Calico, MD Follow up in 1 week(s).   Specialty:  Internal Medicine Contact information: [phone]  [place] [address] [phone]           TOTAL DISCHARGE TIME: 74 minutes  [facility] Pager [phone]  02/10/2016, 10:29 AM

## 2016-02-10 NOTE — Clinical Social Work Placement (Signed)
Medical Social Worker facilitated patient discharge including contacting patient family and facility to confirm patient discharge plans.  Clinical information faxed to facility and family agreeable with plan.  MSW arranged ambulance transport via [place] to [facility] (pvt room).  RN to call report prior to discharge [phone].  Medical Social Worker will sign off for now as social work intervention is no longer needed. Please consult [place] again if new need arises.   CLINICAL SOCIAL WORK PLACEMENT  NOTE  Date:  02/10/2016  Patient Details  Name: [name] MRN: [id number] Date of Birth: May 16, 1925  Clinical Social Work is seeking post-discharge placement for this patient at the [place] level of care (*CSW will initial, date and re-position this form in  chart as items are completed):  Yes   Patient/family provided with [facility] Work Department's list of facilities offering this level of care within the geographic area requested by the patient (or if unable, by the patient's family).  Yes   Patient/family informed of their freedom to choose among providers that offer the needed level of care, that participate in Medicare, Medicaid or managed care program needed by the patient, have an available bed and are willing to accept the patient.  Yes   Patient/family informed of [facility]'s ownership interest in [facility] and [facility], as well as of the fact that they are under no obligation to receive care at these facilities.  PASRR submitted to EDS on 02/09/16     PASRR number received on       Existing PASRR number confirmed on 02/09/16     FL2 transmitted to all facilities in geographic area requested by pt/family on 02/09/16     FL2 transmitted to all facilities within larger geographic area on       Patient informed that his/her managed care company has contracts with or will negotiate with certain facilities,  including the following:        Yes   Patient/family informed of bed offers received.  Patient chooses bed at  [facility] and [place] )     Physician recommends and patient chooses bed at      Patient to be transferred to  [facility] [place] ) on 02/10/16.  Patient to be transferred to facility by  [facility] )     Patient family notified on 02/10/16 of transfer.  Name of family member notified:   (Pt's son, Richardson Landry )     PHYSICIAN Please sign FL2, Please prepare priority discharge summary, including medications     Additional Comment:    _______________________________________________ Glendon Axe A 02/10/2016, 11:23 AM

## 2016-02-10 NOTE — Progress Notes (Signed)
Pt's vitals are WNL, tolerating diet and pain is under control. Bertha and spoke with [place]. Report was given and all questions and concerns were addressed. Patient was transported via [facility] to [facility].

## 2016-02-11 ENCOUNTER — Encounter: Payer: Self-pay | Admitting: Adult Health

## 2016-02-11 ENCOUNTER — Non-Acute Institutional Stay (SKILLED_NURSING_FACILITY): Payer: Medicare Other | Admitting: Adult Health

## 2016-02-11 DIAGNOSIS — R42 Dizziness and giddiness: Secondary | ICD-10-CM | POA: Diagnosis not present

## 2016-02-11 DIAGNOSIS — T402X5A Adverse effect of other opioids, initial encounter: Secondary | ICD-10-CM | POA: Diagnosis not present

## 2016-02-11 DIAGNOSIS — K5903 Drug induced constipation: Secondary | ICD-10-CM

## 2016-02-11 DIAGNOSIS — K219 Gastro-esophageal reflux disease without esophagitis: Secondary | ICD-10-CM

## 2016-02-11 DIAGNOSIS — S060X9S Concussion with loss of consciousness of unspecified duration, sequela: Secondary | ICD-10-CM | POA: Diagnosis not present

## 2016-02-11 DIAGNOSIS — H409 Unspecified glaucoma: Secondary | ICD-10-CM | POA: Diagnosis not present

## 2016-02-11 DIAGNOSIS — G8929 Other chronic pain: Secondary | ICD-10-CM

## 2016-02-11 DIAGNOSIS — D649 Anemia, unspecified: Secondary | ICD-10-CM | POA: Diagnosis not present

## 2016-02-11 DIAGNOSIS — E876 Hypokalemia: Secondary | ICD-10-CM | POA: Diagnosis not present

## 2016-02-11 DIAGNOSIS — M549 Dorsalgia, unspecified: Secondary | ICD-10-CM

## 2016-02-11 DIAGNOSIS — R2681 Unsteadiness on feet: Secondary | ICD-10-CM

## 2016-02-11 DIAGNOSIS — G4700 Insomnia, unspecified: Secondary | ICD-10-CM | POA: Diagnosis not present

## 2016-02-11 NOTE — Progress Notes (Signed)
Patient ID: RILIE GLANZ, female   DOB: 1926/03/06, 82 y.o.   MRN: [id number]    DATE:  02/11/2016   MRN:  [id number]  BIRTHDAY: 1925-06-27  Facility:  [facility] Home Location:  [facility] Room Number: 706-P  LEVEL OF CARE:  SNF [phone])  Contact Information    Name Relation Home Work Mobile   Hedgespath,Shirley Sister [phone]  [phone]   Joellyn Rued [phone]  [phone]   Alba Destine Daughter   [phone]       Code Status History    Date Active Date Inactive Code Status Order ID Comments User Context   02/07/2016 12:21 AM 02/10/2016  5:00 PM DNR [id number]  Edwin Dada, MD Inpatient    Questions for Most Recent Historical Code Status (Order [id number])    Question Answer Comment   In the event of cardiac or respiratory ARREST Do not call a "code blue"    In the event of cardiac or respiratory ARREST Do not perform Intubation, CPR, defibrillation or ACLS    In the event of cardiac or respiratory ARREST Use medication by any route, position, wound care, and other measures to relive pain and suffering. May use oxygen, suction and manual treatment of airway obstruction as needed for comfort.         Advance Directive Documentation   Flowsheet Row Most Recent Value  Type of Advance Directive  Out of facility DNR (pink MOST or yellow form)  Pre-existing out of facility DNR order (yellow form or pink MOST form)  No data  "MOST" Form in Place?  No data       Chief Complaint  Patient presents with  . Hospitalization Follow-up    HISTORY OF PRESENT ILLNESS:  This is an 82 year old female who has been admitted to [facility] on 02/10/16 from [facility] post hospitalization 02/06/16 - 02/10/16. She has PMH of COPD and chronic pain from arthritis and scoliosis on oral morphine. She had a fall and sustained a concussion. CT of head showed no evidence of acute intracranial abnormality. CT of cervical spine  reports no static evidence of acute injury to the cervical spine. She was experiencing dizziness and was thought to be caused by the concussion. She was started on Meclizine. She has severe arthritis and has chronic back pain. She is being seen by Dr. Nelva Bush, a pain management specialist.  She has been admitted for a short-term rehabilitation.  She is seen in her room with her caregiver @ bedside. She requested that ger ocuvite be given to her in gummy form instead of tablet. She has complained of not being able to sleep last night.   PAST MEDICAL HISTORY:  Past Medical History:  Diagnosis Date  . Adenomatous colon polyp 04/26/95   tubulovillous  . Allergy   . Arthritis   . Constipation   . COPD, moderate ([place])   . Degenerative joint disease   . Depression   . Gastritis   . GERD (gastroesophageal reflux disease)   . Glaucoma   . Insomnia   . Lack of bladder control   . Macular degeneration   . OA (osteoarthritis)   . Renal cyst    right  . Shortness of breath   . Small bowel obstruction   . Trigeminal neuralgia      CURRENT MEDICATIONS: Reviewed  Patient's Medications  New Prescriptions   No medications on file  Previous Medications   ACETAMINOPHEN (TYLENOL) 500 MG TABLET  Take 2 tablets (1,000 mg total) by mouth every 6 (six) hours as needed.   ASPIRIN EC 81 MG TABLET    Take 81 mg by mouth every evening.    BETA CAROTENE W/MINERALS (OCUVITE) TABLET    Take 1 tablet by mouth 2 (two) times daily.   BISACODYL (DULCOLAX) 10 MG SUPPOSITORY    Place 1 suppository (10 mg total) rectally daily as needed for moderate constipation.   LATANOPROST (XALATAN) 0.005 % OPHTHALMIC SOLUTION    Place 1 drop into both eyes at bedtime.   MECLIZINE (ANTIVERT) 12.5 MG TABLET    Take 1 tablet (12.5 mg total) by mouth 3 (three) times daily.   MORPHINE (MSIR) 15 MG TABLET    Take 1 tablet (15 mg total) by mouth every 6 (six) hours.   OMEPRAZOLE (PRILOSEC) 20 MG CAPSULE    Take 20 mg by mouth  every morning.    SENNA-DOCUSATE (SENOKOT-S) 8.6-50 MG TABLET    Take 1 tablet by mouth 2 (two) times daily.   TIMOLOL (BETIMOL) 0.5 % OPHTHALMIC SOLUTION    Place 1 drop into both eyes 2 (two) times daily.    TRAZODONE (DESYREL) 50 MG TABLET    Take 1 tablet (50 mg total) by mouth at bedtime as needed for sleep.  Modified Medications   No medications on file  Discontinued Medications   TRAVOPROST, BENZALKONIUM, (TRAVATAN) 0.004 % OPHTHALMIC SOLUTION    Place 1 drop into both eyes at bedtime.      Allergies  Allergen Reactions  . Codeine Itching and Nausea Only    Small amounts okay  . Pregabalin Other (See Comments)    Confusion and hallucination  . Promethazine Hcl Other (See Comments)    Muscle cramps  . Sulfonamide Derivatives Nausea Only     REVIEW OF SYSTEMS:  GENERAL: no change in appetite, no fatigue, no weight changes, no fever, chills or weakness EYES: Denies change in vision, dry eyes, eye pain, itching or discharge EARS: Denies change in hearing, ringing in ears, or earache NOSE: Denies nasal congestion or epistaxis MOUTH and THROAT: Denies oral discomfort, gingival pain or bleeding, pain from teeth or hoarseness   RESPIRATORY: no cough, SOB, DOE, wheezing, hemoptysis CARDIAC: no chest pain, edema or palpitations GI: no abdominal pain, diarrhea, constipation, heart burn, nausea or vomiting GU: Denies dysuria, frequency, hematuria, incontinence, or discharge PSYCHIATRIC: Denies feeling of depression or anxiety. No report of hallucinations, paranoia, or agitation, +insomnia    PHYSICAL EXAMINATION  GENERAL APPEARANCE: Well nourished. In no acute distress. Normal body habitus SKIN:  Skin is warm and dry.  HEAD: Normal in size and contour. No evidence of trauma EYES: Lids open and close normally. No blepharitis, entropion or ectropion. PERRL. Conjunctivae are clear and sclerae are white. Lenses are without opacity EARS: Pinnae are normal. Patient hears normal voice  tunes of the examiner MOUTH and THROAT: Lips are without lesions. Oral mucosa is moist and without lesions. Tongue is normal in shape, size, and color and without lesions NECK: supple, trachea midline, no neck masses, no thyroid tenderness, no thyromegaly LYMPHATICS: no LAN in the neck, no supraclavicular LAN RESPIRATORY: breathing is even & unlabored, BS CTAB CARDIAC: RRR, no murmur,no extra heart sounds, no edema GI: abdomen soft, normal BS, no masses, no tenderness, no hepatomegaly, no splenomegaly EXTREMITIES:  Able to move X 4 extremities PSYCHIATRIC: Alert and oriented X 3. Affect and behavior are appropriate  LABS/RADIOLOGY: Labs reviewed: Basic Metabolic Panel:  Recent Labs  01/06/16 1438  02/06/16 1620 02/07/16 2305  NA 140 139 138  K 4.4 3.5 3.4*  CL 104 109 113*  CO2 31 22 18*  GLUCOSE 89 102* 105*  BUN 21 16 12   CREATININE 0.94 0.85 0.82  CALCIUM 9.5 9.7 8.5*   Liver Function Tests:  Recent Labs  01/06/16 1438 02/06/16 1620  AST 17 27  ALT 13 18  ALKPHOS 59 57  BILITOT 0.3 0.5  PROT 7.4 7.2  ALBUMIN 3.9 3.8    Recent Labs  01/06/16 1438 02/06/16 1620 02/07/16 2305  WBC 8.5 20.2* 8.5  NEUTROABS 4.3 17.2*  --   HGB 12.9 12.5 10.4*  HCT 38.6 38.4 31.0*  MCV 87.5 88.7 86.1  PLT 304.0 286 229   Lipid Panel:  Recent Labs  01/06/16 1438  HDL 70.40   CBG:  Recent Labs  02/06/16 1539  GLUCAP 113*      Ct Head Wo Contrast  Result Date: 02/06/2016 CLINICAL DATA:  82 year old female with headache and neck pain following fall and head injury today. EXAM: CT HEAD WITHOUT CONTRAST CT CERVICAL SPINE WITHOUT CONTRAST TECHNIQUE: Multidetector CT imaging of the head and cervical spine was performed following the standard protocol without intravenous contrast. Multiplanar CT image reconstructions of the cervical spine were also generated. COMPARISON:  10/11/2005 cervical spine radiographs FINDINGS: CT HEAD FINDINGS Brain: No evidence of acute  infarction, hemorrhage, hydrocephalus, extra-axial collection or mass lesion/mass effect. Atrophy and chronic small-vessel white matter ischemic changes noted. Vascular: Intracranial vascular calcifications noted. Skull: Normal. Negative for fracture or focal lesion. Sinuses/Orbits: No acute finding. Other: A posterior scalp hematoma is noted. CT CERVICAL SPINE FINDINGS No acute fracture, acute subluxation or prevertebral soft tissue swelling identified. Moderate multilevel degenerative disc disease and spondylosis noted. No suspicious focal bony lesions are identified. No soft tissue abnormalities are identified. The lung apices are clear. IMPRESSION: No evidence of acute intracranial abnormality. Atrophy and chronic small-vessel white matter ischemic changes. Posterior scalp hematoma without underlying fracture. No static evidence of acute injury to the cervical spine. Moderate multilevel degenerative changes. Electronically Signed   By: Margarette Canada M.D.   On: 02/06/2016 16:34   Ct Cervical Spine Wo Contrast  Result Date: 02/06/2016 CLINICAL DATA:  82 year old female with headache and neck pain following fall and head injury today. EXAM: CT HEAD WITHOUT CONTRAST CT CERVICAL SPINE WITHOUT CONTRAST TECHNIQUE: Multidetector CT imaging of the head and cervical spine was performed following the standard protocol without intravenous contrast. Multiplanar CT image reconstructions of the cervical spine were also generated. COMPARISON:  10/11/2005 cervical spine radiographs FINDINGS: CT HEAD FINDINGS Brain: No evidence of acute infarction, hemorrhage, hydrocephalus, extra-axial collection or mass lesion/mass effect. Atrophy and chronic small-vessel white matter ischemic changes noted. Vascular: Intracranial vascular calcifications noted. Skull: Normal. Negative for fracture or focal lesion. Sinuses/Orbits: No acute finding. Other: A posterior scalp hematoma is noted. CT CERVICAL SPINE FINDINGS No acute fracture, acute  subluxation or prevertebral soft tissue swelling identified. Moderate multilevel degenerative disc disease and spondylosis noted. No suspicious focal bony lesions are identified. No soft tissue abnormalities are identified. The lung apices are clear. IMPRESSION: No evidence of acute intracranial abnormality. Atrophy and chronic small-vessel white matter ischemic changes. Posterior scalp hematoma without underlying fracture. No static evidence of acute injury to the cervical spine. Moderate multilevel degenerative changes. Electronically Signed   By: Margarette Canada M.D.   On: 02/06/2016 16:34    ASSESSMENT/PLAN:  Unsteady gait - for rehabilitation, PT and OT, for therapeutic strengthening exercises; fall  precaution  Concussion - CT of head showed no evidence of acute intracranial abnormality. CT of cervical spine reports no static evidence of acute injury to the cervical spine; for rehabilitation, PT and OT, for therapeutic strengthening exercises  Vertigo - most likely secondary to concussion; continue Meclizine 12.5 mg 1 tab PO TID  Chronic back pain - continue Morphine sulfate IR 15 mg 1 tab PO Q 6 hours and Acetaminophen 500 mg take 2 tabs = 1,00 mg PO Q 6 hours PRN  Therapeutic Opioid-induced Constipation - continue Senna-S 8.6-50 mg 1 tab PO BID, Dulcolax suppository 10 mg per rectum daily PRN  Insomnia - continue Trazodone 50 mg 1 tab PO Q HS PRN and if not effective in an hour, may have 1/2 tab= 25 mg PO Q HS PRN  GERD - continue Omeprazole 20 mg 1 capsule PO Q D  Glaucoma - continue Latanoprost 0.005% 1 gtt to both eyes Q HS and Ocuvite gummy 1 PO BID  Hypokalemia - re-check BMP Lab Results  Component Value Date   K 3.4 (L) 02/07/2016   Anemia - repeat CBC Lab Results  Component Value Date   HGB 10.4 (L) 02/07/2016       Goals of care:  Short-term rehabilitation    Durenda Age, NP [facility] [phone]

## 2016-02-12 LAB — CBC AND DIFFERENTIAL
HEMATOCRIT: 34 % — AB (ref 36–46)
Hemoglobin: 11.4 g/dL — AB (ref 12.0–16.0)
NEUTROS ABS: 5 /uL
Platelets: 340 10*3/uL (ref 150–399)
WBC: 7.8 10*3/mL

## 2016-02-12 LAB — BASIC METABOLIC PANEL
BUN: 7 mg/dL (ref 4–21)
Creatinine: 0.7 mg/dL (ref 0.5–1.1)
GLUCOSE: 87 mg/dL
POTASSIUM: 3.3 mmol/L — AB (ref 3.4–5.3)
Sodium: 143 mmol/L (ref 137–147)

## 2016-02-16 ENCOUNTER — Non-Acute Institutional Stay (SKILLED_NURSING_FACILITY): Payer: Medicare Other | Admitting: Internal Medicine

## 2016-02-16 ENCOUNTER — Encounter: Payer: Self-pay | Admitting: Internal Medicine

## 2016-02-16 DIAGNOSIS — G8929 Other chronic pain: Secondary | ICD-10-CM | POA: Diagnosis not present

## 2016-02-16 DIAGNOSIS — D638 Anemia in other chronic diseases classified elsewhere: Secondary | ICD-10-CM | POA: Diagnosis not present

## 2016-02-16 DIAGNOSIS — R531 Weakness: Secondary | ICD-10-CM | POA: Diagnosis not present

## 2016-02-16 DIAGNOSIS — K219 Gastro-esophageal reflux disease without esophagitis: Secondary | ICD-10-CM

## 2016-02-16 DIAGNOSIS — M545 Low back pain: Secondary | ICD-10-CM

## 2016-02-16 DIAGNOSIS — E876 Hypokalemia: Secondary | ICD-10-CM

## 2016-02-16 DIAGNOSIS — S060X0S Concussion without loss of consciousness, sequela: Secondary | ICD-10-CM | POA: Diagnosis not present

## 2016-02-16 DIAGNOSIS — H8113 Benign paroxysmal vertigo, bilateral: Secondary | ICD-10-CM | POA: Diagnosis not present

## 2016-02-16 DIAGNOSIS — K5909 Other constipation: Secondary | ICD-10-CM | POA: Diagnosis not present

## 2016-02-16 DIAGNOSIS — R2689 Other abnormalities of gait and mobility: Secondary | ICD-10-CM

## 2016-02-16 LAB — BASIC METABOLIC PANEL
BUN: 14 mg/dL (ref 4–21)
CREATININE: 0.8 mg/dL (ref 0.5–1.1)
Glucose: 84 mg/dL
POTASSIUM: 5.1 mmol/L (ref 3.4–5.3)
SODIUM: 142 mmol/L (ref 137–147)

## 2016-02-16 NOTE — Progress Notes (Signed)
LOCATION: [place]  PCP: Scarlette Calico, MD   Code Status: DNR  Goals of care: Advanced Directive information Advanced Directives 02/11/2016  Does patient have an advance directive? Yes  Type of Advance Directive Out of facility DNR (pink MOST or yellow form)  Does patient want to make changes to advanced directive? No - Patient declined  Copy of advanced directive(s) in chart? Yes  Would patient like information on creating an advanced directive? -       Extended Emergency Contact Information Primary Emergency Contact: Hedgespath,Shirley Address: [place], [place] [address] [place] Phone: [phone] Mobile Phone: [phone] Relation: Sister Secondary Emergency Contact: Mabe,Steve Address: [address]          [place], [place] Phone: [phone] Mobile Phone: [phone] Relation: Son   Allergies  Allergen Reactions  . Codeine Itching and Nausea Only    Small amounts okay  . Pregabalin Other (See Comments)    Confusion and hallucination  . Promethazine Hcl Other (See Comments)    Muscle cramps  . Sulfonamide Derivatives Nausea Only    Chief Complaint  Patient presents with  . New Admit To SNF    New Admission Visit     HPI:  Patient is a 82 y.o. female seen today for short term rehabilitation post hospital admission from 02/06/16-02/10/16 post fall with concussion. CT head was negative for acute intracranial abnormalities. CT cervical spine was negative for acute injury. She had dizziness and was started on meclizine. She has severe scoliosis and arthritis and is on chronic pain medication. She is followed in pain clinic. She has PMH of COPD. chronic back pain and scoliosis. She is seen in her room today.   Review of Systems:  Constitutional: Negative for fever, chills, diaphoresis. Feels weak and tired. HENT: Negative for headache, congestion, nasal discharge Eyes:  Negative for blurred vision, double vision and discharge.  Respiratory: Negative for cough, shortness of breath and wheezing.   Cardiovascular: Negative for chest pain, palpitations, leg swelling.  Gastrointestinal: Negative for heartburn, nausea, vomiting, abdominal pain. Last bowel movement was this morning.  Genitourinary: Negative for dysuria and flank pain.  Musculoskeletal: Negative for fall in the facility. Positive for chronic back pain. Skin: Negative for itching, rash.  Neurological:  Positive for dizziness with change of position. Psychiatric/Behavioral: Negative for depression   Past Medical History:  Diagnosis Date  . Adenomatous colon polyp 04/26/95   tubulovillous  . Allergy   . Arthritis   . Constipation   . COPD, moderate ([place])   . Degenerative joint disease   . Depression   . Gastritis   . GERD (gastroesophageal reflux disease)   . Glaucoma   . Insomnia   . Lack of bladder control   . Macular degeneration   . OA (osteoarthritis)   . Renal cyst    right  . Shortness of breath   . Small bowel obstruction   . Trigeminal neuralgia    Past Surgical History:  Procedure Laterality Date  . ABDOMINAL HYSTERECTOMY     nonmalignant reasons  . BACK SURGERY  05/20/08  . CATARACT EXTRACTION     left  . CHOLECYSTECTOMY    . DILATION AND CURETTAGE OF UTERUS    . JOINT REPLACEMENT     right hip  . PARTIAL COLECTOMY  04/26/95   tubulovillous adenoma  . SHOULDER SURGERY  12/24/08   left  .  TONSILLECTOMY    . TOTAL SHOULDER ARTHROPLASTY Right 07/05/2012   Procedure: RIGHT TOTAL SHOULDER ARTHROPLASTY;  Surgeon: Marin Shutter, MD;  Location: [place];  Service: Orthopedics;  Laterality: Right;  Right total shoulder arthroplasty   Social History:   reports that she quit smoking about 10 years ago. Her smoking use included Cigarettes. She has a 50.00 pack-year smoking history. She has never used smokeless tobacco. She reports that she does not drink alcohol or use  drugs.  Family History  Problem Relation Age of Onset  . Cancer Father     colon  . Heart disease Sister   . Colon polyps Brother   . Diabetes Brother     Medications:   Medication List       Accurate as of 02/16/16  2:51 PM. Always use your most recent med list.          acetaminophen 500 MG tablet Commonly known as:  TYLENOL Take 2 tablets (1,000 mg total) by mouth every 6 (six) hours as needed.   aspirin EC 81 MG tablet Take 81 mg by mouth every evening.   beta carotene w/minerals tablet Take 1 tablet by mouth 2 (two) times daily.   bisacodyl 10 MG suppository Commonly known as:  DULCOLAX Place 1 suppository (10 mg total) rectally daily as needed for moderate constipation.   latanoprost 0.005 % ophthalmic solution Commonly known as:  XALATAN Place 1 drop into both eyes at bedtime.   meclizine 12.5 MG tablet Commonly known as:  ANTIVERT Take 1 tablet (12.5 mg total) by mouth 3 (three) times daily.   morphine 15 MG tablet Commonly known as:  MSIR Take 1 tablet (15 mg total) by mouth every 6 (six) hours.   omeprazole 20 MG capsule Commonly known as:  PRILOSEC Take 20 mg by mouth every morning.   senna-docusate 8.6-50 MG tablet Commonly known as:  Senokot-S Take 1 tablet by mouth 2 (two) times daily.   timolol 0.5 % ophthalmic solution Commonly known as:  BETIMOL Place 1 drop into both eyes 2 (two) times daily.   traZODone 50 MG tablet Commonly known as:  DESYREL Take 25 mg by mouth at bedtime. If the 1st dose of Trazadone 50 mg is not effective   traZODone 50 MG tablet Commonly known as:  DESYREL Take 1 tablet (50 mg total) by mouth at bedtime as needed for sleep.       Immunizations: Immunization History  Administered Date(s) Administered  . Influenza Split 03/28/2011, 02/27/2012  . Influenza Whole 01/10/2008, 02/19/2010  . Influenza, High Dose Seasonal PF 01/06/2016  . Influenza,inj,Quad PF,36+ Mos 02/08/2013, 02/11/2014  .  Influenza-Unspecified 03/10/2015  . Pneumococcal Conjugate-13 09/24/2014  . Pneumococcal Polysaccharide-23 05/31/2002, 09/03/2007, 01/06/2016  . Td 05/31/2002  . Tdap 09/24/2014  . Zoster 10/09/2006     Physical Exam:  Vitals:   02/16/16 1445  BP: (!) 121/44  Pulse: 62  Resp: 18  Temp: 97.7 F (36.5 C)  TempSrc: Oral  SpO2: 96%  Weight: 116 lb (52.6 kg)  Height: 5\' 2"  (1.575 m)   Body mass index is 21.22 kg/m.  General- elderly female, well built, in no acute distress Head- normocephalic, atraumatic Nose- no maxillary or frontal sinus tenderness, no nasal discharge Throat- moist mucus membrane Eyes- PERRLA, EOMI, no pallor, no icterus, no discharge, normal conjunctiva, normal sclera Neck- no cervical lymphadenopathy Cardiovascular- normal s1,s2, no murmur, no leg edema Respiratory- bilateral clear to auscultation, no wheeze, no rhonchi, no crackles, no use of  accessory muscles Abdomen- bowel sounds present, soft, non tender Musculoskeletal- able to move all 4 extremities, generalized weakness, limited ROM to both shoulders, scoliosis present Neurological- alert and oriented to person, place and time Skin- warm and dry Psychiatry- normal mood and affect    Labs reviewed: Basic Metabolic Panel:  Recent Labs  01/06/16 1438 02/06/16 1620 02/07/16 2305  NA 140 139 138  K 4.4 3.5 3.4*  CL 104 109 113*  CO2 31 22 18*  GLUCOSE 89 102* 105*  BUN 21 16 12   CREATININE 0.94 0.85 0.82  CALCIUM 9.5 9.7 8.5*   Liver Function Tests:  Recent Labs  01/06/16 1438 02/06/16 1620  AST 17 27  ALT 13 18  ALKPHOS 59 57  BILITOT 0.3 0.5  PROT 7.4 7.2  ALBUMIN 3.9 3.8   No results for input(s): LIPASE, AMYLASE in the last 8760 hours. No results for input(s): AMMONIA in the last 8760 hours. CBC:  Recent Labs  01/06/16 1438 02/06/16 1620 02/07/16 2305  WBC 8.5 20.2* 8.5  NEUTROABS 4.3 17.2*  --   HGB 12.9 12.5 10.4*  HCT 38.6 38.4 31.0*  MCV 87.5 88.7 86.1   PLT 304.0 286 229   Cardiac Enzymes: No results for input(s): CKTOTAL, CKMB, CKMBINDEX, TROPONINI in the last 8760 hours. BNP: Invalid input(s): POCBNP CBG:  Recent Labs  02/06/16 1539  GLUCAP 113*    Radiological Exams: Ct Head Wo Contrast  Result Date: 02/06/2016 CLINICAL DATA:  82 year old female with headache and neck pain following fall and head injury today. EXAM: CT HEAD WITHOUT CONTRAST CT CERVICAL SPINE WITHOUT CONTRAST TECHNIQUE: Multidetector CT imaging of the head and cervical spine was performed following the standard protocol without intravenous contrast. Multiplanar CT image reconstructions of the cervical spine were also generated. COMPARISON:  10/11/2005 cervical spine radiographs FINDINGS: CT HEAD FINDINGS Brain: No evidence of acute infarction, hemorrhage, hydrocephalus, extra-axial collection or mass lesion/mass effect. Atrophy and chronic small-vessel white matter ischemic changes noted. Vascular: Intracranial vascular calcifications noted. Skull: Normal. Negative for fracture or focal lesion. Sinuses/Orbits: No acute finding. Other: A posterior scalp hematoma is noted. CT CERVICAL SPINE FINDINGS No acute fracture, acute subluxation or prevertebral soft tissue swelling identified. Moderate multilevel degenerative disc disease and spondylosis noted. No suspicious focal bony lesions are identified. No soft tissue abnormalities are identified. The lung apices are clear. IMPRESSION: No evidence of acute intracranial abnormality. Atrophy and chronic small-vessel white matter ischemic changes. Posterior scalp hematoma without underlying fracture. No static evidence of acute injury to the cervical spine. Moderate multilevel degenerative changes. Electronically Signed   By: Margarette Canada M.D.   On: 02/06/2016 16:34   Ct Cervical Spine Wo Contrast  Result Date: 02/06/2016 CLINICAL DATA:  82 year old female with headache and neck pain following fall and head injury today. EXAM: CT  HEAD WITHOUT CONTRAST CT CERVICAL SPINE WITHOUT CONTRAST TECHNIQUE: Multidetector CT imaging of the head and cervical spine was performed following the standard protocol without intravenous contrast. Multiplanar CT image reconstructions of the cervical spine were also generated. COMPARISON:  10/11/2005 cervical spine radiographs FINDINGS: CT HEAD FINDINGS Brain: No evidence of acute infarction, hemorrhage, hydrocephalus, extra-axial collection or mass lesion/mass effect. Atrophy and chronic small-vessel white matter ischemic changes noted. Vascular: Intracranial vascular calcifications noted. Skull: Normal. Negative for fracture or focal lesion. Sinuses/Orbits: No acute finding. Other: A posterior scalp hematoma is noted. CT CERVICAL SPINE FINDINGS No acute fracture, acute subluxation or prevertebral soft tissue swelling identified. Moderate multilevel degenerative disc disease and spondylosis noted.  No suspicious focal bony lesions are identified. No soft tissue abnormalities are identified. The lung apices are clear. IMPRESSION: No evidence of acute intracranial abnormality. Atrophy and chronic small-vessel white matter ischemic changes. Posterior scalp hematoma without underlying fracture. No static evidence of acute injury to the cervical spine. Moderate multilevel degenerative changes. Electronically Signed   By: Margarette Canada M.D.   On: 02/06/2016 16:34    Assessment/Plan  Generalized weakness Will have patient work with PT/OT as tolerated to regain strength and restore function.  Fall precautions are in place.  Poor balance Will have her work with physical therapy and occupational therapy team to help with gait training and muscle strengthening exercises.fall precautions. Skin care. Encourage to be out of bed.   Hypokalemia Monitor bmp  Vertigo Has dizziness. Currently on meclizine 12.5 mg tid. Change this to meclizine 25 mg bid and monitor   Anemia of chronic disease Monitor cbc  Concussion   Post fall, neurologically intact. CT head and cervical spine negative for acute abnormalities. Monitor clinically.   Chronic back pain Stable, continue morphine IR 15 mg qid and tylenol on need basis, PMR to evaluate  Chronic Constipation continue Senna-S daily and prn dulcolax  GERD continue omeprazole 20 mg daily   Goals of care: short term rehabilitation   Labs/tests ordered: cbc, cmp  Family/ staff Communication: reviewed care plan with patient and nursing supervisor    Blanchie Serve, MD Internal Medicine [place], [place] [address] Cell Phone (Monday-Friday 8 am - 5 pm): [phone] On Call: [phone] and follow prompts after 5 pm and on weekends Office Phone: [phone] Office Fax: [phone]

## 2016-02-18 ENCOUNTER — Other Ambulatory Visit: Payer: Self-pay

## 2016-02-18 MED ORDER — MORPHINE SULFATE 15 MG PO TABS: 15.0000 mg | ORAL_TABLET | Freq: Four times a day (QID) | ORAL | 0 refills | Status: DC

## 2016-02-18 NOTE — Telephone Encounter (Signed)
Prescription request was received from:  [facility] [address] [place] [address]  Phone: [phone]  Fax: [phone]  

## 2016-02-19 ENCOUNTER — Other Ambulatory Visit: Payer: Self-pay

## 2016-02-19 MED ORDER — MORPHINE SULFATE 15 MG PO TABS: 15.0000 mg | ORAL_TABLET | Freq: Four times a day (QID) | ORAL | 0 refills | Status: DC

## 2016-02-25 ENCOUNTER — Encounter: Payer: Self-pay | Admitting: Adult Health

## 2016-02-25 ENCOUNTER — Non-Acute Institutional Stay (SKILLED_NURSING_FACILITY): Payer: Medicare Other | Admitting: Adult Health

## 2016-02-25 DIAGNOSIS — S060X0S Concussion without loss of consciousness, sequela: Secondary | ICD-10-CM | POA: Diagnosis not present

## 2016-02-25 DIAGNOSIS — H409 Unspecified glaucoma: Secondary | ICD-10-CM | POA: Diagnosis not present

## 2016-02-25 DIAGNOSIS — R531 Weakness: Secondary | ICD-10-CM

## 2016-02-25 DIAGNOSIS — K219 Gastro-esophageal reflux disease without esophagitis: Secondary | ICD-10-CM | POA: Diagnosis not present

## 2016-02-25 DIAGNOSIS — K5909 Other constipation: Secondary | ICD-10-CM

## 2016-02-25 DIAGNOSIS — R42 Dizziness and giddiness: Secondary | ICD-10-CM | POA: Diagnosis not present

## 2016-02-25 DIAGNOSIS — K5903 Drug induced constipation: Secondary | ICD-10-CM | POA: Diagnosis not present

## 2016-02-25 DIAGNOSIS — T402X5A Adverse effect of other opioids, initial encounter: Secondary | ICD-10-CM | POA: Diagnosis not present

## 2016-02-25 DIAGNOSIS — G4700 Insomnia, unspecified: Secondary | ICD-10-CM | POA: Diagnosis not present

## 2016-02-25 DIAGNOSIS — D649 Anemia, unspecified: Secondary | ICD-10-CM

## 2016-02-25 DIAGNOSIS — G8929 Other chronic pain: Secondary | ICD-10-CM | POA: Diagnosis not present

## 2016-02-25 DIAGNOSIS — M549 Dorsalgia, unspecified: Secondary | ICD-10-CM

## 2016-02-25 NOTE — Progress Notes (Signed)
Patient ID: [name], female   DOB: 1925-05-30, 82 y.o.   MRN: [id number]    DATE:  02/25/16   MRN:  [id number]  BIRTHDAY: October 16, 1925  Facility:  [facility] Location:  [place] Room Number: 706-P  LEVEL OF CARE:  SNF [phone])  Contact Information    Name Relation Home Work Mobile   [place],Shirley Sister [phone]  [phone]   Joellyn Rued [phone]  [phone]   Alba Destine Daughter   [phone]       Code Status History    Date Active Date Inactive Code Status Order ID Comments User Context   02/07/2016 12:21 AM 02/10/2016  5:00 PM DNR [id number]  Edwin Dada, MD Inpatient    Questions for Most Recent Historical Code Status (Order [id number])    Question Answer Comment   In the event of cardiac or respiratory ARREST Do not call a "code blue"    In the event of cardiac or respiratory ARREST Do not perform Intubation, CPR, defibrillation or ACLS    In the event of cardiac or respiratory ARREST Use medication by any route, position, wound care, and other measures to relive pain and suffering. May use oxygen, suction and manual treatment of airway obstruction as needed for comfort.         Advance Directive Documentation   Flowsheet Row Most Recent Value  Type of Advance Directive  Out of facility DNR (pink MOST or yellow form)  Pre-existing out of facility DNR order (yellow form or pink MOST form)  No data  "MOST" Form in Place?  No data       Chief Complaint  Patient presents with  . Discharge Note    HISTORY OF PRESENT ILLNESS:  This is an 82 year old female who is for discharge home with Home health PT and OT. DME:  Bedside commode  She has been admitted to [facility] on 02/10/16 from [facility] post hospitalization 02/06/16 - 02/10/16. She has PMH of COPD and chronic pain from arthritis and scoliosis on oral morphine. She had a fall and sustained a concussion. CT of head showed no  evidence of acute intracranial abnormality. CT of cervical spine reports no static evidence of acute injury to the cervical spine. She was experiencing dizziness and was thought to be caused by the concussion. She was started on Meclizine. She has severe arthritis and has chronic back pain. She is being seen by Dr. Nelva Bush, a pain management specialist.  Patient was admitted to this facility for short-term rehabilitation after the patient's recent hospitalization.  Patient has completed SNF rehabilitation and therapy has cleared the patient for discharge.   PAST MEDICAL HISTORY:  Past Medical History:  Diagnosis Date  . Adenomatous colon polyp 04/26/95   tubulovillous  . Allergy   . Arthritis   . Constipation   . COPD, moderate ([place])   . Degenerative joint disease   . Depression   . Gastritis   . GERD (gastroesophageal reflux disease)   . Glaucoma   . Insomnia   . Lack of bladder control   . Macular degeneration   . OA (osteoarthritis)   . Renal cyst    right  . Shortness of breath   . Small bowel obstruction   . Trigeminal neuralgia      CURRENT MEDICATIONS: Reviewed  Patient's Medications  New Prescriptions   No medications on file  Previous Medications   ACETAMINOPHEN (TYLENOL) 500 MG TABLET    Take  2 tablets (1,000 mg total) by mouth every 6 (six) hours as needed.   ASPIRIN EC 81 MG TABLET    Take 81 mg by mouth every evening.    BETA CAROTENE W/MINERALS (OCUVITE) TABLET    Take 1 tablet by mouth 2 (two) times daily.   BISACODYL (DULCOLAX) 10 MG SUPPOSITORY    Place 1 suppository (10 mg total) rectally daily as needed for moderate constipation.   LATANOPROST (XALATAN) 0.005 % OPHTHALMIC SOLUTION    Place 1 drop into both eyes at bedtime.   MECLIZINE (ANTIVERT) 25 MG TABLET    Take 25 mg by mouth 2 (two) times daily.   MORPHINE (MSIR) 15 MG TABLET    Take 1 tablet (15 mg total) by mouth every 6 (six) hours.   OMEPRAZOLE (PRILOSEC) 20 MG CAPSULE    Take 20 mg by mouth every  morning.    POLYETHYLENE GLYCOL (MIRALAX / GLYCOLAX) PACKET    Take 17 g by mouth daily.   SENNA-DOCUSATE (SENOKOT-S) 8.6-50 MG TABLET    Take 1 tablet by mouth 2 (two) times daily.   TIMOLOL (BETIMOL) 0.5 % OPHTHALMIC SOLUTION    Place 1 drop into both eyes 2 (two) times daily.    TRAZODONE (DESYREL) 50 MG TABLET    Take 1 tablet (50 mg total) by mouth at bedtime as needed for sleep.   TRAZODONE (DESYREL) 50 MG TABLET    Take 25 mg by mouth at bedtime. If the 1st dose of Trazadone 50 mg is not effective  Modified Medications   No medications on file  Discontinued Medications   MECLIZINE (ANTIVERT) 12.5 MG TABLET    Take 1 tablet (12.5 mg total) by mouth 3 (three) times daily.     Allergies  Allergen Reactions  . Codeine Itching and Nausea Only    Small amounts okay  . Pregabalin Other (See Comments)    Confusion and hallucination  . Promethazine Hcl Other (See Comments)    Muscle cramps  . Sulfonamide Derivatives Nausea Only     REVIEW OF SYSTEMS:  GENERAL: no change in appetite, no fatigue, no weight changes, no fever, chills or weakness EYES: Denies change in vision, dry eyes, eye pain, itching or discharge EARS: Denies change in hearing, ringing in ears, or earache NOSE: Denies nasal congestion or epistaxis MOUTH and THROAT: Denies oral discomfort, gingival pain or bleeding, pain from teeth or hoarseness   RESPIRATORY: no cough, SOB, DOE, wheezing, hemoptysis CARDIAC: no chest pain, edema or palpitations GI: no abdominal pain, diarrhea, constipation, heart burn, nausea or vomiting GU: Denies dysuria, frequency, hematuria, incontinence, or discharge PSYCHIATRIC: Denies feeling of depression or anxiety. No report of hallucinations, paranoia, or agitation    PHYSICAL EXAMINATION  GENERAL APPEARANCE: Well nourished. In no acute distress. Normal body habitus SKIN:  Skin is warm and dry.  HEAD: Normal in size and contour. No evidence of trauma EYES: Lids open and close  normally. No blepharitis, entropion or ectropion. PERRL. Conjunctivae are clear and sclerae are white. Lenses are without opacity EARS: Pinnae are normal. Patient hears normal voice tunes of the examiner MOUTH and THROAT: Lips are without lesions. Oral mucosa is moist and without lesions. Tongue is normal in shape, size, and color and without lesions NECK: supple, trachea midline, no neck masses, no thyroid tenderness, no thyromegaly LYMPHATICS: no LAN in the neck, no supraclavicular LAN RESPIRATORY: breathing is even & unlabored, BS CTAB CARDIAC: RRR, no murmur,no extra heart sounds, no edema GI: abdomen soft, normal  BS, no masses, no tenderness, no hepatomegaly, no splenomegaly EXTREMITIES:  Able to move X 4 extremities PSYCHIATRIC: Alert and oriented X 3. Affect and behavior are appropriate  LABS/RADIOLOGY: Labs reviewed: Basic Metabolic Panel:  Recent Labs  01/06/16 1438 02/06/16 1620 02/07/16 2305 02/12/16 02/16/16  NA 140 139 138 143 142  K 4.4 3.5 3.4* 3.3* 5.1  CL 104 109 113*  --   --   CO2 31 22 18*  --   --   GLUCOSE 89 102* 105*  --   --   BUN 21 16 12 7 14   CREATININE 0.94 0.85 0.82 0.7 0.8  CALCIUM 9.5 9.7 8.5*  --   --    Liver Function Tests:  Recent Labs  01/06/16 1438 02/06/16 1620  AST 17 27  ALT 13 18  ALKPHOS 59 57  BILITOT 0.3 0.5  PROT 7.4 7.2  ALBUMIN 3.9 3.8    Recent Labs  01/06/16 1438 02/06/16 1620 02/07/16 2305 02/12/16  WBC 8.5 20.2* 8.5 7.8  NEUTROABS 4.3 17.2*  --  5  HGB 12.9 12.5 10.4* 11.4*  HCT 38.6 38.4 31.0* 34*  MCV 87.5 88.7 86.1  --   PLT 304.0 286 229 340   Lipid Panel:  Recent Labs  01/06/16 1438  HDL 70.40   CBG:  Recent Labs  02/06/16 1539  GLUCAP 113*      Ct Head Wo Contrast  Result Date: 02/06/2016 CLINICAL DATA:  82 year old female with headache and neck pain following fall and head injury today. EXAM: CT HEAD WITHOUT CONTRAST CT CERVICAL SPINE WITHOUT CONTRAST TECHNIQUE: Multidetector CT  imaging of the head and cervical spine was performed following the standard protocol without intravenous contrast. Multiplanar CT image reconstructions of the cervical spine were also generated. COMPARISON:  10/11/2005 cervical spine radiographs FINDINGS: CT HEAD FINDINGS Brain: No evidence of acute infarction, hemorrhage, hydrocephalus, extra-axial collection or mass lesion/mass effect. Atrophy and chronic small-vessel white matter ischemic changes noted. Vascular: Intracranial vascular calcifications noted. Skull: Normal. Negative for fracture or focal lesion. Sinuses/Orbits: No acute finding. Other: A posterior scalp hematoma is noted. CT CERVICAL SPINE FINDINGS No acute fracture, acute subluxation or prevertebral soft tissue swelling identified. Moderate multilevel degenerative disc disease and spondylosis noted. No suspicious focal bony lesions are identified. No soft tissue abnormalities are identified. The lung apices are clear. IMPRESSION: No evidence of acute intracranial abnormality. Atrophy and chronic small-vessel white matter ischemic changes. Posterior scalp hematoma without underlying fracture. No static evidence of acute injury to the cervical spine. Moderate multilevel degenerative changes. Electronically Signed   By: Margarette Canada M.D.   On: 02/06/2016 16:34   Ct Cervical Spine Wo Contrast  Result Date: 02/06/2016 CLINICAL DATA:  82 year old female with headache and neck pain following fall and head injury today. EXAM: CT HEAD WITHOUT CONTRAST CT CERVICAL SPINE WITHOUT CONTRAST TECHNIQUE: Multidetector CT imaging of the head and cervical spine was performed following the standard protocol without intravenous contrast. Multiplanar CT image reconstructions of the cervical spine were also generated. COMPARISON:  10/11/2005 cervical spine radiographs FINDINGS: CT HEAD FINDINGS Brain: No evidence of acute infarction, hemorrhage, hydrocephalus, extra-axial collection or mass lesion/mass effect.  Atrophy and chronic small-vessel white matter ischemic changes noted. Vascular: Intracranial vascular calcifications noted. Skull: Normal. Negative for fracture or focal lesion. Sinuses/Orbits: No acute finding. Other: A posterior scalp hematoma is noted. CT CERVICAL SPINE FINDINGS No acute fracture, acute subluxation or prevertebral soft tissue swelling identified. Moderate multilevel degenerative disc disease and spondylosis noted. No  suspicious focal bony lesions are identified. No soft tissue abnormalities are identified. The lung apices are clear. IMPRESSION: No evidence of acute intracranial abnormality. Atrophy and chronic small-vessel white matter ischemic changes. Posterior scalp hematoma without underlying fracture. No static evidence of acute injury to the cervical spine. Moderate multilevel degenerative changes. Electronically Signed   By: Margarette Canada M.D.   On: 02/06/2016 16:34    ASSESSMENT/PLAN:  Generalized weakness - for Home health PT and OT, for therapeutic strengthening exercises; fall precaution  Concussion - CT of head showed no evidence of acute intracranial abnormality. CT of cervical spine reports no static evidence of acute injury to the cervical spine; for Home health PT and OT, for therapeutic strengthening exercises  Vertigo - most likely secondary to concussion; recently changed Meclizine 25 mg 1 tab PO BID  Chronic back pain - continue Morphine sulfate IR 15 mg 1 tab PO Q 6 hours and Acetaminophen 500 mg take 2 tabs = 1,00 mg PO Q 6 hours PRN  Therapeutic Opioid-induced Constipation - continue Senna-S 8.6-50 mg 1 tab PO BID, Dulcolax suppository 10 mg per rectum daily PRN  Insomnia - continue Trazodone 50 mg 1 tab PO Q HS PRN and if not effective in an hour, may have 1/2 tab= 25 mg PO Q HS PRN  GERD - continue Omeprazole 20 mg 1 capsule PO Q D  Glaucoma - continue Latanoprost 0.005% 1 gtt to both eyes Q HS and Ocuvite gummy 1 PO BID  Hypokalemia - was supplemented;  now resolved Lab Results  Component Value Date   K 5.1 02/16/2016   Anemia - increased from hgb 10.4; stable Lab Results  Component Value Date   HGB 11.4 (A) 02/12/2016   Constipation - continue Senna-S 2 tabs PO BID and Miralax 17 gm PO Q D     I have filled out patient's discharge paperwork and written prescriptions.  Patient will receive home health PT and OT.  DME provided:  Bedside Commode  Total discharge time: Greater than 30 minutes Greater than 50% was spent in counseling and coordinating of care with the patient.   Discharge time involved coordination of the discharge process with social worker, nursing staff and therapy department. Medical justification for home health services/DME verified.    Durenda Age, NP [employer] [phone]

## 2016-03-01 DIAGNOSIS — K219 Gastro-esophageal reflux disease without esophagitis: Secondary | ICD-10-CM | POA: Diagnosis not present

## 2016-03-01 DIAGNOSIS — M150 Primary generalized (osteo)arthritis: Secondary | ICD-10-CM | POA: Diagnosis not present

## 2016-03-01 DIAGNOSIS — S0990XD Unspecified injury of head, subsequent encounter: Secondary | ICD-10-CM | POA: Diagnosis not present

## 2016-03-01 DIAGNOSIS — S7002XD Contusion of left hip, subsequent encounter: Secondary | ICD-10-CM | POA: Diagnosis not present

## 2016-03-01 DIAGNOSIS — M419 Scoliosis, unspecified: Secondary | ICD-10-CM | POA: Diagnosis not present

## 2016-03-01 DIAGNOSIS — S060X0D Concussion without loss of consciousness, subsequent encounter: Secondary | ICD-10-CM | POA: Diagnosis not present

## 2016-03-01 DIAGNOSIS — Z9181 History of falling: Secondary | ICD-10-CM | POA: Diagnosis not present

## 2016-03-01 DIAGNOSIS — J449 Chronic obstructive pulmonary disease, unspecified: Secondary | ICD-10-CM | POA: Diagnosis not present

## 2016-03-01 DIAGNOSIS — F329 Major depressive disorder, single episode, unspecified: Secondary | ICD-10-CM | POA: Diagnosis not present

## 2016-03-02 ENCOUNTER — Telehealth: Payer: Self-pay | Admitting: Emergency Medicine

## 2016-03-02 DIAGNOSIS — M150 Primary generalized (osteo)arthritis: Secondary | ICD-10-CM | POA: Diagnosis not present

## 2016-03-02 DIAGNOSIS — J449 Chronic obstructive pulmonary disease, unspecified: Secondary | ICD-10-CM | POA: Diagnosis not present

## 2016-03-02 DIAGNOSIS — S060X0D Concussion without loss of consciousness, subsequent encounter: Secondary | ICD-10-CM | POA: Diagnosis not present

## 2016-03-02 DIAGNOSIS — M419 Scoliosis, unspecified: Secondary | ICD-10-CM | POA: Diagnosis not present

## 2016-03-02 DIAGNOSIS — S7002XD Contusion of left hip, subsequent encounter: Secondary | ICD-10-CM | POA: Diagnosis not present

## 2016-03-02 DIAGNOSIS — S0990XD Unspecified injury of head, subsequent encounter: Secondary | ICD-10-CM | POA: Diagnosis not present

## 2016-03-02 NOTE — Telephone Encounter (Signed)
[facility] called and wants verbal orders for home health PT. 3 times a week for 2 weeks. Heart rate has been low the past few days. She is not symptomatic. Please advise thanks.

## 2016-03-02 NOTE — Telephone Encounter (Signed)
Called HH and gave verbal for PT 3 times a week for 2 weeks.  Please advise regarding asymptomatic decrease in HR (50-60 bpm)

## 2016-03-04 DIAGNOSIS — M419 Scoliosis, unspecified: Secondary | ICD-10-CM | POA: Diagnosis not present

## 2016-03-04 DIAGNOSIS — S0990XD Unspecified injury of head, subsequent encounter: Secondary | ICD-10-CM | POA: Diagnosis not present

## 2016-03-04 DIAGNOSIS — S060X0D Concussion without loss of consciousness, subsequent encounter: Secondary | ICD-10-CM | POA: Diagnosis not present

## 2016-03-04 DIAGNOSIS — J449 Chronic obstructive pulmonary disease, unspecified: Secondary | ICD-10-CM | POA: Diagnosis not present

## 2016-03-04 DIAGNOSIS — M150 Primary generalized (osteo)arthritis: Secondary | ICD-10-CM | POA: Diagnosis not present

## 2016-03-04 DIAGNOSIS — S7002XD Contusion of left hip, subsequent encounter: Secondary | ICD-10-CM | POA: Diagnosis not present

## 2016-03-08 DIAGNOSIS — M419 Scoliosis, unspecified: Secondary | ICD-10-CM | POA: Diagnosis not present

## 2016-03-08 DIAGNOSIS — J449 Chronic obstructive pulmonary disease, unspecified: Secondary | ICD-10-CM | POA: Diagnosis not present

## 2016-03-08 DIAGNOSIS — S060X0D Concussion without loss of consciousness, subsequent encounter: Secondary | ICD-10-CM | POA: Diagnosis not present

## 2016-03-08 DIAGNOSIS — S7002XD Contusion of left hip, subsequent encounter: Secondary | ICD-10-CM | POA: Diagnosis not present

## 2016-03-08 DIAGNOSIS — M150 Primary generalized (osteo)arthritis: Secondary | ICD-10-CM | POA: Diagnosis not present

## 2016-03-08 DIAGNOSIS — S0990XD Unspecified injury of head, subsequent encounter: Secondary | ICD-10-CM | POA: Diagnosis not present

## 2016-03-10 DIAGNOSIS — J449 Chronic obstructive pulmonary disease, unspecified: Secondary | ICD-10-CM | POA: Diagnosis not present

## 2016-03-10 DIAGNOSIS — S7002XD Contusion of left hip, subsequent encounter: Secondary | ICD-10-CM | POA: Diagnosis not present

## 2016-03-10 DIAGNOSIS — M419 Scoliosis, unspecified: Secondary | ICD-10-CM | POA: Diagnosis not present

## 2016-03-10 DIAGNOSIS — M150 Primary generalized (osteo)arthritis: Secondary | ICD-10-CM | POA: Diagnosis not present

## 2016-03-10 DIAGNOSIS — S0990XD Unspecified injury of head, subsequent encounter: Secondary | ICD-10-CM | POA: Diagnosis not present

## 2016-03-10 DIAGNOSIS — S060X0D Concussion without loss of consciousness, subsequent encounter: Secondary | ICD-10-CM | POA: Diagnosis not present

## 2016-03-22 ENCOUNTER — Other Ambulatory Visit: Payer: Self-pay | Admitting: Adult Health

## 2016-03-23 DIAGNOSIS — G894 Chronic pain syndrome: Secondary | ICD-10-CM | POA: Diagnosis not present

## 2016-03-23 DIAGNOSIS — M961 Postlaminectomy syndrome, not elsewhere classified: Secondary | ICD-10-CM | POA: Diagnosis not present

## 2016-03-23 DIAGNOSIS — M5136 Other intervertebral disc degeneration, lumbar region: Secondary | ICD-10-CM | POA: Diagnosis not present

## 2016-03-23 DIAGNOSIS — Z79891 Long term (current) use of opiate analgesic: Secondary | ICD-10-CM | POA: Diagnosis not present

## 2016-04-04 DIAGNOSIS — H353211 Exudative age-related macular degeneration, right eye, with active choroidal neovascularization: Secondary | ICD-10-CM | POA: Diagnosis not present

## 2016-04-04 DIAGNOSIS — H353134 Nonexudative age-related macular degeneration, bilateral, advanced atrophic with subfoveal involvement: Secondary | ICD-10-CM | POA: Diagnosis not present

## 2016-04-04 DIAGNOSIS — H353232 Exudative age-related macular degeneration, bilateral, with inactive choroidal neovascularization: Secondary | ICD-10-CM | POA: Diagnosis not present

## 2016-04-04 DIAGNOSIS — H35051 Retinal neovascularization, unspecified, right eye: Secondary | ICD-10-CM | POA: Diagnosis not present

## 2016-04-11 DIAGNOSIS — H401133 Primary open-angle glaucoma, bilateral, severe stage: Secondary | ICD-10-CM | POA: Diagnosis not present

## 2016-04-23 ENCOUNTER — Emergency Department
Admission: EM | Admit: 2016-04-23 | Discharge: 2016-04-23 | Disposition: A | Discharge status: Home / Self Care | Payer: Medicare Other | Attending: Student in an Organized Health Care Education/Training Program | Admitting: Student in an Organized Health Care Education/Training Program

## 2016-04-23 ENCOUNTER — Encounter: Payer: Self-pay | Admitting: Emergency Medicine

## 2016-04-23 ENCOUNTER — Emergency Department: Payer: Medicare Other

## 2016-04-23 DIAGNOSIS — Y999 Unspecified external cause status: Secondary | ICD-10-CM | POA: Insufficient documentation

## 2016-04-23 DIAGNOSIS — Z79899 Other long term (current) drug therapy: Secondary | ICD-10-CM | POA: Diagnosis not present

## 2016-04-23 DIAGNOSIS — S0003XA Contusion of scalp, initial encounter: Secondary | ICD-10-CM

## 2016-04-23 DIAGNOSIS — Z87891 Personal history of nicotine dependence: Secondary | ICD-10-CM | POA: Diagnosis not present

## 2016-04-23 DIAGNOSIS — J449 Chronic obstructive pulmonary disease, unspecified: Secondary | ICD-10-CM | POA: Diagnosis not present

## 2016-04-23 DIAGNOSIS — W1809XA Striking against other object with subsequent fall, initial encounter: Secondary | ICD-10-CM | POA: Diagnosis not present

## 2016-04-23 DIAGNOSIS — S0101XA Laceration without foreign body of scalp, initial encounter: Secondary | ICD-10-CM | POA: Insufficient documentation

## 2016-04-23 DIAGNOSIS — S199XXA Unspecified injury of neck, initial encounter: Secondary | ICD-10-CM | POA: Diagnosis not present

## 2016-04-23 DIAGNOSIS — Z7982 Long term (current) use of aspirin: Secondary | ICD-10-CM | POA: Insufficient documentation

## 2016-04-23 DIAGNOSIS — Y929 Unspecified place or not applicable: Secondary | ICD-10-CM | POA: Diagnosis not present

## 2016-04-23 DIAGNOSIS — S0990XA Unspecified injury of head, initial encounter: Secondary | ICD-10-CM | POA: Diagnosis not present

## 2016-04-23 DIAGNOSIS — W19XXXA Unspecified fall, initial encounter: Secondary | ICD-10-CM

## 2016-04-23 DIAGNOSIS — Y9389 Activity, other specified: Secondary | ICD-10-CM | POA: Insufficient documentation

## 2016-04-23 HISTORY — DX: Bradycardia, unspecified: R00.1

## 2016-04-23 MED ORDER — LIDOCAINE-EPINEPHRINE-TETRACAINE (LET) SOLUTION
3.0000 mL | Freq: Once | NASAL | Status: AC
  Administered 2016-04-23: 3 mL via TOPICAL
  Filled 2016-04-23: qty 3

## 2016-04-23 NOTE — ED Notes (Signed)
See triage note  Was sitting on the floor  Bent over and hit head on filing cabinet  Denies any LOC laceration noted to top of head

## 2016-04-23 NOTE — Discharge Instructions (Signed)
Clean daily with mild soap and water. Watch for signs of infection. Tylenol if needed for pain. Staple removal in 7 days either with your primary care doctor, urgent care, or nurses at the [facility] [place].

## 2016-04-23 NOTE — ED Triage Notes (Signed)
Patient states that she was bending over putting stuff in in file cabinet and lost her balance and fell hitting her head. Patient has about 1 inch laceration to the top of her head. Bleeding is controlled at this time. Patient states that she takes 81 mg Aspirin every day. Patient denies headache.

## 2016-04-23 NOTE — ED Provider Notes (Signed)
[facility] Emergency Department Provider Note   ____________________________________________   First MD Initiated Contact with Patient 04/23/16 1249     (approximate)  I have reviewed the triage vital signs and the nursing notes.   HISTORY  Chief Complaint Fall   HPI [name] is a 82 y.o. female is here with a laceration to her scalp. Patient states that she was bending over putting stuff in a filing cabinet and lost her balance striking her head on the top of the desk. She denies any loss of consciousness or changes in her vision. She does have a laceration to her scalp. Patient states that she has had a tetanus booster within the last 5 years. She states that she does take aspirin each day. She denies any other blood thinners. Patient denies any headache or visual changes. She does relate that in the past she has suffered a concussion in which she was hospitalized for one week and then sent to rehabilitation. Currently she rates her pain as a 1/10.   Past Medical History:  Diagnosis Date  . Adenomatous colon polyp 04/26/95   tubulovillous  . Allergy   . Arthritis   . Bradycardia   . Constipation   . COPD, moderate ([place])   . Degenerative joint disease   . Depression   . Gastritis   . GERD (gastroesophageal reflux disease)   . Glaucoma   . Insomnia   . Lack of bladder control   . Macular degeneration   . OA (osteoarthritis)   . Renal cyst    right  . Shortness of breath   . Small bowel obstruction   . Trigeminal neuralgia     Patient Active Problem List   Diagnosis Date Noted  . Concussion 02/06/2016  . Hyperlipidemia with target LDL less than 160 09/24/2014  . Routine general medical examination at a health care facility 09/24/2014  . MACULAR DEGENERATION 08/07/2008  . Glaucoma 08/07/2008  . Insomnia 08/07/2008  . Constipation 07/22/2008  . NEURALGIA, TRIGEMINAL 01/07/2008  . Allergic rhinitis 09/03/2007  . COPD 09/03/2007    . GERD 09/03/2007  . Osteoarthritis 09/03/2007    Past Surgical History:  Procedure Laterality Date  . ABDOMINAL HYSTERECTOMY     nonmalignant reasons  . BACK SURGERY  05/20/08  . CATARACT EXTRACTION     left  . CHOLECYSTECTOMY    . DILATION AND CURETTAGE OF UTERUS    . JOINT REPLACEMENT     right hip  . PARTIAL COLECTOMY  04/26/95   tubulovillous adenoma  . SHOULDER SURGERY  12/24/08   left  . TONSILLECTOMY    . TOTAL SHOULDER ARTHROPLASTY Right 07/05/2012   Procedure: RIGHT TOTAL SHOULDER ARTHROPLASTY;  Surgeon: Marin Shutter, MD;  Location: [place];  Service: Orthopedics;  Laterality: Right;  Right total shoulder arthroplasty    Prior to Admission medications   Medication Sig Start Date End Date Taking? Authorizing Provider  acetaminophen (TYLENOL) 500 MG tablet Take 2 tablets (1,000 mg total) by mouth every 6 (six) hours as needed. 02/10/16   Bonnielee Haff, MD  aspirin EC 81 MG tablet Take 81 mg by mouth every evening.     Historical Provider, MD  beta carotene w/minerals (OCUVITE) tablet Take 1 tablet by mouth 2 (two) times daily.    Historical Provider, MD  bisacodyl (DULCOLAX) 10 MG suppository Place 1 suppository (10 mg total) rectally daily as needed for moderate constipation. 02/10/16   Bonnielee Haff, MD  latanoprost (XALATAN) 0.005 %  ophthalmic solution Place 1 drop into both eyes at bedtime.    Historical Provider, MD  meclizine (ANTIVERT) 25 MG tablet Take 25 mg by mouth 2 (two) times daily.    Historical Provider, MD  morphine (MSIR) 15 MG tablet Take 1 tablet (15 mg total) by mouth every 6 (six) hours. 02/19/16   Tiffany L Reed, DO  omeprazole (PRILOSEC) 20 MG capsule Take 20 mg by mouth every morning.     Historical Provider, MD  polyethylene glycol (MIRALAX / GLYCOLAX) packet Take 17 g by mouth daily.    Historical Provider, MD  senna-docusate (SENOKOT-S) 8.6-50 MG tablet Take 1 tablet by mouth 2 (two) times daily. 02/10/16   Bonnielee Haff, MD  timolol (BETIMOL) 0.5  % ophthalmic solution Place 1 drop into both eyes 2 (two) times daily.     Historical Provider, MD  traZODone (DESYREL) 50 MG tablet Take 1 tablet (50 mg total) by mouth at bedtime as needed for sleep. 02/10/16   Bonnielee Haff, MD  traZODone (DESYREL) 50 MG tablet Take 25 mg by mouth at bedtime. If the 1st dose of Trazadone 50 mg is not effective    Historical Provider, MD    Allergies Codeine; Pregabalin; Promethazine hcl; and Sulfonamide derivatives  Family History  Problem Relation Age of Onset  . Cancer Father     colon  . Heart disease Sister   . Colon polyps Brother   . Diabetes Brother     Social History Social History  Substance Use Topics  . Smoking status: Former Smoker    Packs/day: 1.00    Years: 50.00    Types: Cigarettes    Quit date: 06/28/2005  . Smokeless tobacco: Never Used  . Alcohol use No    Review of Systems Constitutional: No fever/chills Eyes: No visual changes. ENT:No trauma Cardiovascular: Denies chest pain. Respiratory: Denies shortness of breath. Gastrointestinal: No abdominal pain.  No nausea, no vomiting.   Musculoskeletal: Negative for back pain. Skin: Positive for laceration. Neurological: Negative for headaches, focal weakness or numbness.  10-point ROS otherwise negative.  ____________________________________________   PHYSICAL EXAM:  VITAL SIGNS: ED Triage Vitals  Enc Vitals Group     BP 04/23/16 1147 (!) 153/56     Pulse Rate 04/23/16 1147 (!) 50     Resp 04/23/16 1147 16     Temp 04/23/16 1147 98.2 F (36.8 C)     Temp Source 04/23/16 1147 Oral     SpO2 04/23/16 1147 100 %     Weight 04/23/16 1147 116 lb (52.6 kg)     Height 04/23/16 1147 5\' 1"  (1.549 m)     Head Circumference --      Peak Flow --      Pain Score 04/23/16 1148 1     Pain Loc --      Pain Edu? --      Excl. in [place]? --     Constitutional: Alert and oriented. Well appearing and in no acute distress.Patient answers questions appropriately. Eyes:  Conjunctivae are normal. PERRL. EOMI. Head: Atraumatic. Nose: No congestion/rhinnorhea. Neck: No stridor.  No cervical tenderness on palpation posteriorly. Range of motion is without pain. Cardiovascular: Normal rate, regular rhythm. Grossly normal heart sounds.  Good peripheral circulation. Respiratory: Normal respiratory effort.  No retractions. Lungs CTAB. Musculoskeletal: Moves upper and lower extremities without any difficulty. Patient was ambulatory in the emergency room but mostly stayed seated in a wheelchair per her preference. Neurologic:  Normal speech and language. No gross  focal neurologic deficits are appreciated.  Skin:  Skin is warm, dry. There is a 2 cm laceration to the posterior scalp without active bleeding. No foreign body was noted. Psychiatric: Mood and affect are normal. Speech and behavior are normal.  ____________________________________________   LABS (all labs ordered are listed, but only abnormal results are displayed)  Labs Reviewed - No data to display  RADIOLOGY  CT head and cervical spine without contrast per radiologist: IMPRESSION:  No acute intracranial abnormality.Atrophy, chronic microvascular  disease.    Spondylosis. No acute bony abnormality in the cervical spine.     ____________________________________________   PROCEDURES  Procedure(s) performed: LACERATION REPAIR Performed by: Johnn Hai Authorized by: Johnn Hai Consent: Verbal consent obtained. Risks and benefits: risks, benefits and alternatives were discussed Consent given by: patient Patient identity confirmed: provided demographic data Prepped and Draped in normal sterile fashion Wound explored  Laceration Location: Scalp  Laceration Length: 2 cm  No Foreign Bodies seen or palpated  Anesthesia: local infiltration  Local anesthetic: LET  Irrigation method: syringe Amount of cleaning: standard  Skin closure: Staples   Number of staples: 2    Patient tolerance: Patient tolerated the procedure well with no immediate complications.  Procedures  Critical Care performed: No  ____________________________________________   INITIAL IMPRESSION / ASSESSMENT AND PLAN / ED COURSE  Pertinent labs & imaging results that were available during my care of the patient were reviewed by me and considered in my medical decision making (see chart for details).    Clinical Course    Patient was made aware that her CT head and cervical spine did not show any acute changes. Staples were placed and patient tolerated this procedure well. She is aware that she can call her primary care or go to urgent care for removal of staples. She is given information on how to clean area daily with mild soap and water and watch for signs of infection. She may take Tylenol as needed for pain.  ____________________________________________   FINAL CLINICAL IMPRESSION(S) / ED DIAGNOSES  Final diagnoses:  Laceration of scalp, initial encounter  Contusion of scalp, initial encounter  Fall, initial encounter      NEW MEDICATIONS STARTED DURING THIS VISIT:  Discharge Medication List as of 04/23/2016  2:45 PM       Note:  This document was prepared using Dragon voice recognition software and may include unintentional dictation errors.    Johnn Hai, PA-C 04/23/16 1559    Merlyn Lot, MD 04/23/16 [phone]

## 2016-05-23 DIAGNOSIS — L309 Dermatitis, unspecified: Secondary | ICD-10-CM | POA: Diagnosis not present

## 2016-05-23 DIAGNOSIS — H401133 Primary open-angle glaucoma, bilateral, severe stage: Secondary | ICD-10-CM | POA: Diagnosis not present

## 2016-05-23 DIAGNOSIS — L820 Inflamed seborrheic keratosis: Secondary | ICD-10-CM | POA: Diagnosis not present

## 2016-07-18 DIAGNOSIS — M5136 Other intervertebral disc degeneration, lumbar region: Secondary | ICD-10-CM | POA: Diagnosis not present

## 2016-07-18 DIAGNOSIS — M961 Postlaminectomy syndrome, not elsewhere classified: Secondary | ICD-10-CM | POA: Diagnosis not present

## 2016-07-18 DIAGNOSIS — G894 Chronic pain syndrome: Secondary | ICD-10-CM | POA: Diagnosis not present

## 2016-07-18 DIAGNOSIS — Z79891 Long term (current) use of opiate analgesic: Secondary | ICD-10-CM | POA: Diagnosis not present

## 2016-08-31 ENCOUNTER — Encounter: Payer: Self-pay | Admitting: Internal Medicine

## 2016-08-31 ENCOUNTER — Ambulatory Visit (INDEPENDENT_AMBULATORY_CARE_PROVIDER_SITE_OTHER): Payer: Medicare Other | Admitting: Internal Medicine

## 2016-08-31 VITALS — BP 140/60 | HR 54 | Temp 97.7°F | Resp 16 | Ht 61.0 in | Wt 119.2 lb

## 2016-08-31 DIAGNOSIS — F5101 Primary insomnia: Secondary | ICD-10-CM

## 2016-08-31 DIAGNOSIS — Z515 Encounter for palliative care: Secondary | ICD-10-CM | POA: Diagnosis not present

## 2016-08-31 MED ORDER — ESZOPICLONE 2 MG PO TABS: 2.0000 mg | ORAL_TABLET | Freq: Every evening | ORAL | 5 refills | Status: DC | PRN

## 2016-08-31 NOTE — Patient Instructions (Signed)
Insomnia Insomnia is a sleep disorder that makes it difficult to fall asleep or to stay asleep. Insomnia can cause tiredness (fatigue), low energy, difficulty concentrating, mood swings, and poor performance at work or school. There are three different ways to classify insomnia:  Difficulty falling asleep.  Difficulty staying asleep.  Waking up too early in the morning. Any type of insomnia can be long-term (chronic) or short-term (acute). Both are common. Short-term insomnia usually lasts for three months or less. Chronic insomnia occurs at least three times a week for longer than three months. What are the causes? Insomnia may be caused by another condition, situation, or substance, such as:  Anxiety.  Certain medicines.  Gastroesophageal reflux disease (GERD) or other gastrointestinal conditions.  Asthma or other breathing conditions.  Restless legs syndrome, sleep apnea, or other sleep disorders.  Chronic pain.  Menopause. This may include hot flashes.  Stroke.  Abuse of alcohol, tobacco, or illegal drugs.  Depression.  Caffeine.  Neurological disorders, such as Alzheimer disease.  An overactive thyroid (hyperthyroidism). The cause of insomnia may not be known. What increases the risk? Risk factors for insomnia include:  Gender. Women are more commonly affected than men.  Age. Insomnia is more common as you get older.  Stress. This may involve your professional or personal life.  Income. Insomnia is more common in people with lower income.  Lack of exercise.  Irregular work schedule or night shifts.  Traveling between different time zones. What are the signs or symptoms? If you have insomnia, trouble falling asleep or trouble staying asleep is the main symptom. This may lead to other symptoms, such as:  Feeling fatigued.  Feeling nervous about going to sleep.  Not feeling rested in the morning.  Having trouble concentrating.  Feeling irritable,  anxious, or depressed. How is this treated? Treatment for insomnia depends on the cause. If your insomnia is caused by an underlying condition, treatment will focus on addressing the condition. Treatment may also include:  Medicines to help you sleep.  Counseling or therapy.  Lifestyle adjustments. Follow these instructions at home:  Take medicines only as directed by your health care provider.  Keep regular sleeping and waking hours. Avoid naps.  Keep a sleep diary to help you and your health care provider figure out what could be causing your insomnia. Include:  When you sleep.  When you wake up during the night.  How well you sleep.  How rested you feel the next day.  Any side effects of medicines you are taking.  What you eat and drink.  Make your bedroom a comfortable place where it is easy to fall asleep:  Put up shades or special blackout curtains to block light from outside.  Use a white noise machine to block noise.  Keep the temperature cool.  Exercise regularly as directed by your health care provider. Avoid exercising right before bedtime.  Use relaxation techniques to manage stress. Ask your health care provider to suggest some techniques that may work well for you. These may include:  Breathing exercises.  Routines to release muscle tension.  Visualizing peaceful scenes.  Cut back on alcohol, caffeinated beverages, and cigarettes, especially close to bedtime. These can disrupt your sleep.  Do not overeat or eat spicy foods right before bedtime. This can lead to digestive discomfort that can make it hard for you to sleep.  Limit screen use before bedtime. This includes:  Watching TV.  Using your smartphone, tablet, and computer.  Stick to a   routine. This can help you fall asleep faster. Try to do a quiet activity, brush your teeth, and go to bed at the same time each night.  Get out of bed if you are still awake after 15 minutes of trying to  sleep. Keep the lights down, but try reading or doing a quiet activity. When you feel sleepy, go back to bed.  Make sure that you drive carefully. Avoid driving if you feel very sleepy.  Keep all follow-up appointments as directed by your health care provider. This is important. Contact a health care provider if:  You are tired throughout the day or have trouble in your daily routine due to sleepiness.  You continue to have sleep problems or your sleep problems get worse. Get help right away if:  You have serious thoughts about hurting yourself or someone else. This information is not intended to replace advice given to you by your health care provider. Make sure you discuss any questions you have with your health care provider. Document Released: 04/08/2000 Document Revised: 09/11/2015 Document Reviewed: 01/10/2014 Elsevier Interactive Patient Education  2017 Elsevier Inc.  

## 2016-08-31 NOTE — Progress Notes (Addendum)
Subjective:  Patient ID: [name], female    DOB: 1926/03/01  Age: 83 y.o. MRN: [id number]  CC: Insomnia   HPI [name] presents for f/up - She complains of insomnia with difficulty falling asleep and frequent awakening. Another physician has prescribed trazodone but she says it doesn't help. She previously took Ambien and had a good response to it but she complains it was too expensive. She also wants me to sign a DO NOT RESUSCITATE. She suffers from chronic low back pain, she is aware that the age of 22 if something catastrophic happened any attempts to save her life would be futile, she doesn't have the quality of life that she wants to perpetuate and desires that a DO NOT RESUSCITATE be signed. She is with her caregiver today who is in agreement. She is lucid, alert, and of sound mind.  Outpatient Medications Prior to Visit  Medication Sig Dispense Refill  . acetaminophen (TYLENOL) 500 MG tablet Take 2 tablets (1,000 mg total) by mouth every 6 (six) hours as needed. 30 tablet 0  . aspirin EC 81 MG tablet Take 81 mg by mouth every evening.     . beta carotene w/minerals (OCUVITE) tablet Take 1 tablet by mouth 2 (two) times daily.    . bisacodyl (DULCOLAX) 10 MG suppository Place 1 suppository (10 mg total) rectally daily as needed for moderate constipation. 12 suppository 0  . latanoprost (XALATAN) 0.005 % ophthalmic solution Place 1 drop into both eyes at bedtime.    Marland Kitchen morphine (MSIR) 15 MG tablet Take 1 tablet (15 mg total) by mouth every 6 (six) hours. 120 tablet 0  . omeprazole (PRILOSEC) 20 MG capsule Take 20 mg by mouth every morning.     . polyethylene glycol (MIRALAX / GLYCOLAX) packet Take 17 g by mouth daily.    Marland Kitchen senna-docusate (SENOKOT-S) 8.6-50 MG tablet Take 1 tablet by mouth 2 (two) times daily.    . timolol (BETIMOL) 0.5 % ophthalmic solution Place 1 drop into both eyes 2 (two) times daily.     . meclizine (ANTIVERT) 25 MG tablet Take 25 mg by mouth 2 (two)  times daily.    . traZODone (DESYREL) 50 MG tablet Take 1 tablet (50 mg total) by mouth at bedtime as needed for sleep. 30 tablet 0  . traZODone (DESYREL) 50 MG tablet Take 25 mg by mouth at bedtime. If the 1st dose of Trazadone 50 mg is not effective     No facility-administered medications prior to visit.     ROS Review of Systems  Constitutional: Positive for fatigue. Negative for appetite change, chills, diaphoresis, fever and unexpected weight change.  HENT: Negative.  Negative for trouble swallowing.   Eyes: Negative for visual disturbance.  Respiratory: Negative for cough, chest tightness, shortness of breath and wheezing.   Cardiovascular: Negative for chest pain, palpitations and leg swelling.  Gastrointestinal: Negative for abdominal pain, constipation, diarrhea, nausea and vomiting.  Endocrine: Negative.   Genitourinary: Negative.  Negative for difficulty urinating.  Musculoskeletal: Positive for back pain. Negative for myalgias and neck pain.  Skin: Negative.   Neurological: Negative.  Negative for dizziness, weakness and headaches.  Hematological: Negative for adenopathy. Does not bruise/bleed easily.  Psychiatric/Behavioral: Positive for sleep disturbance. Negative for behavioral problems, confusion, decreased concentration, dysphoric mood and suicidal ideas. The patient is not nervous/anxious.     Objective:  BP 140/60 (BP Location: Left Arm, Patient Position: Sitting, Cuff Size: Normal)   Pulse (!) 54  Temp 97.7 F (36.5 C) (Oral)   Resp 16   Ht 5\' 1"  (1.549 m)   Wt 119 lb 4 oz (54.1 kg)   SpO2 98%   BMI 22.53 kg/m   BP Readings from Last 3 Encounters:  08/31/16 140/60  04/23/16 (!) 153/56  02/25/16 138/62    Wt Readings from Last 3 Encounters:  08/31/16 119 lb 4 oz (54.1 kg)  04/23/16 116 lb (52.6 kg)  02/25/16 116 lb (52.6 kg)    Physical Exam  Constitutional: She is oriented to person, place, and time. No distress.  HENT:  Mouth/Throat:  Oropharynx is clear and moist. No oropharyngeal exudate.  Eyes: Conjunctivae are normal. Right eye exhibits no discharge. Left eye exhibits no discharge. No scleral icterus.  Neck: Normal range of motion. Neck supple. No JVD present. No tracheal deviation present. No thyromegaly present.  Cardiovascular: Normal rate, regular rhythm, normal heart sounds and intact distal pulses.  Exam reveals no gallop and no friction rub.   No murmur heard. Pulmonary/Chest: Effort normal and breath sounds normal. No stridor. No respiratory distress. She has no wheezes. She has no rales. She exhibits no tenderness.  Abdominal: Soft. Bowel sounds are normal. She exhibits no distension and no mass. There is no tenderness. There is no rebound and no guarding.  Musculoskeletal: Normal range of motion. She exhibits no edema, tenderness or deformity.  Lymphadenopathy:    She has no cervical adenopathy.  Neurological: She is oriented to person, place, and time.  Skin: Skin is warm and dry. No rash noted. She is not diaphoretic. No erythema. No pallor.  Psychiatric: She has a normal mood and affect. Her behavior is normal. Judgment and thought content normal.  Vitals reviewed.   Lab Results  Component Value Date   WBC 7.8 02/12/2016   HGB 11.4 (A) 02/12/2016   HCT 34 (A) 02/12/2016   PLT 340 02/12/2016   GLUCOSE 105 (H) 02/07/2016   CHOL 223 (H) 01/06/2016   TRIG 122.0 01/06/2016   HDL 70.40 01/06/2016   LDLDIRECT 106.0 09/03/2007   LDLCALC 129 (H) 01/06/2016   ALT 18 02/06/2016   AST 27 02/06/2016   NA 142 02/16/2016   K 5.1 02/16/2016   CL 113 (H) 02/07/2016   CREATININE 0.8 02/16/2016   BUN 14 02/16/2016   CO2 18 (L) 02/07/2016   TSH 5.16 (H) 01/06/2016   INR 0.92 06/28/2012    Ct Head Wo Contrast  Result Date: 04/23/2016 CLINICAL DATA:  Hit head.  Fall. EXAM: CT HEAD WITHOUT CONTRAST CT CERVICAL SPINE WITHOUT CONTRAST TECHNIQUE: Multidetector CT imaging of the head and cervical spine was  performed following the standard protocol without intravenous contrast. Multiplanar CT image reconstructions of the cervical spine were also generated. COMPARISON:  02/06/2016 FINDINGS: CT HEAD FINDINGS Brain: There is atrophy and chronic small vessel disease changes. No acute intracranial abnormality. Specifically, no hemorrhage, hydrocephalus, mass lesion, acute infarction, or significant intracranial injury. Vascular: No hyperdense vessel or unexpected calcification. Skull: No acute calvarial abnormality. Sinuses/Orbits: Visualized paranasal sinuses and mastoids clear. Orbital soft tissues unremarkable. Other: None CT CERVICAL SPINE FINDINGS Alignment: Slight anterolisthesis of C3 on C4 related to facet disease. Skull base and vertebrae: No acute fracture. No primary bone lesion or focal pathologic process. Soft tissues and spinal canal: No prevertebral fluid or swelling. No visible canal hematoma. Disc levels: Diffuse degenerative disc disease with disc space narrowing and spurring. Diffuse bilateral degenerative facet disease. Upper chest: Negative Other: None IMPRESSION: No acute intracranial abnormality.Atrophy,  chronic microvascular disease. Spondylosis.  No acute bony abnormality in the cervical spine. Electronically Signed   By: Rolm Baptise M.D.   On: 04/23/2016 13:49   Ct Cervical Spine Wo Contrast  Result Date: 04/23/2016 CLINICAL DATA:  Hit head.  Fall. EXAM: CT HEAD WITHOUT CONTRAST CT CERVICAL SPINE WITHOUT CONTRAST TECHNIQUE: Multidetector CT imaging of the head and cervical spine was performed following the standard protocol without intravenous contrast. Multiplanar CT image reconstructions of the cervical spine were also generated. COMPARISON:  02/06/2016 FINDINGS: CT HEAD FINDINGS Brain: There is atrophy and chronic small vessel disease changes. No acute intracranial abnormality. Specifically, no hemorrhage, hydrocephalus, mass lesion, acute infarction, or significant intracranial injury.  Vascular: No hyperdense vessel or unexpected calcification. Skull: No acute calvarial abnormality. Sinuses/Orbits: Visualized paranasal sinuses and mastoids clear. Orbital soft tissues unremarkable. Other: None CT CERVICAL SPINE FINDINGS Alignment: Slight anterolisthesis of C3 on C4 related to facet disease. Skull base and vertebrae: No acute fracture. No primary bone lesion or focal pathologic process. Soft tissues and spinal canal: No prevertebral fluid or swelling. No visible canal hematoma. Disc levels: Diffuse degenerative disc disease with disc space narrowing and spurring. Diffuse bilateral degenerative facet disease. Upper chest: Negative Other: None IMPRESSION: No acute intracranial abnormality.Atrophy, chronic microvascular disease. Spondylosis.  No acute bony abnormality in the cervical spine. Electronically Signed   By: Rolm Baptise M.D.   On: 04/23/2016 13:49    Assessment & Plan:   Corin was seen today for insomnia.  Diagnoses and all orders for this visit:  Primary insomnia -     eszopiclone (LUNESTA) 2 MG TABS tablet; Take 1 tablet (2 mg total) by mouth at bedtime as needed for sleep. Take immediately before bedtime  End of life care- DNR signed, she has the original, we have a copy   I have discontinued Ms. Fehringer's traZODone, traZODone, and meclizine. I am also having her start on eszopiclone. Additionally, I am having her maintain her aspirin EC, omeprazole, beta carotene w/minerals, timolol, acetaminophen, bisacodyl, senna-docusate, latanoprost, morphine, and polyethylene glycol.  Meds ordered this encounter  Medications  . eszopiclone (LUNESTA) 2 MG TABS tablet    Sig: Take 1 tablet (2 mg total) by mouth at bedtime as needed for sleep. Take immediately before bedtime    Dispense:  30 tablet    Refill:  5     Follow-up: Return if symptoms worsen or fail to improve.  Scarlette Calico, MD

## 2016-09-08 ENCOUNTER — Telehealth: Payer: Self-pay

## 2016-09-08 NOTE — Telephone Encounter (Signed)
Faxed PA for [facility]

## 2016-09-08 NOTE — Telephone Encounter (Signed)
Will you call pt and let her know that PA was approved.   Faxing same to [facility].

## 2016-09-21 ENCOUNTER — Telehealth: Payer: Self-pay | Admitting: Internal Medicine

## 2016-09-21 NOTE — Telephone Encounter (Signed)
Pt called regarding her DNR, she needs an affidavit on her mental condition.  Please call back.

## 2016-09-21 NOTE — Telephone Encounter (Signed)
08/31/2016 OV notes printed and placed up for patient.  Pt contacted and informed of same.

## 2016-09-22 DIAGNOSIS — H353222 Exudative age-related macular degeneration, left eye, with inactive choroidal neovascularization: Secondary | ICD-10-CM | POA: Diagnosis not present

## 2016-09-22 DIAGNOSIS — H35051 Retinal neovascularization, unspecified, right eye: Secondary | ICD-10-CM | POA: Diagnosis not present

## 2016-09-22 DIAGNOSIS — H353212 Exudative age-related macular degeneration, right eye, with inactive choroidal neovascularization: Secondary | ICD-10-CM | POA: Diagnosis not present

## 2016-09-22 DIAGNOSIS — H353134 Nonexudative age-related macular degeneration, bilateral, advanced atrophic with subfoveal involvement: Secondary | ICD-10-CM | POA: Diagnosis not present

## 2016-09-26 NOTE — Telephone Encounter (Signed)
Pt states on first page of her document sh was given she needs something changed, it states her daughter was with her and it was not her daughter it was her caregiver. That is all that needs to be changed. I asked if she would bring this to the office, and she said no, I told her I didn't know if we had a copy of this and she got upset and just asked the nurse to call her back.

## 2016-09-26 NOTE — Telephone Encounter (Signed)
changed

## 2016-09-26 NOTE — Telephone Encounter (Signed)
Pt informed and mailed to [facility].

## 2016-10-24 DIAGNOSIS — Z79899 Other long term (current) drug therapy: Secondary | ICD-10-CM | POA: Diagnosis not present

## 2016-10-24 DIAGNOSIS — H401133 Primary open-angle glaucoma, bilateral, severe stage: Secondary | ICD-10-CM | POA: Diagnosis not present

## 2016-11-07 DIAGNOSIS — M5136 Other intervertebral disc degeneration, lumbar region: Secondary | ICD-10-CM | POA: Diagnosis not present

## 2017-01-04 ENCOUNTER — Telehealth: Payer: Self-pay | Admitting: Internal Medicine

## 2017-01-04 ENCOUNTER — Telehealth: Payer: Self-pay

## 2017-01-04 NOTE — Telephone Encounter (Signed)
MD out of the office pls advise on msg below.../lmb 

## 2017-01-04 NOTE — Telephone Encounter (Signed)
Patient Name: [name] DOB: 1925-05-30 Initial Comment Caller states she has a bad and painful case of Cystitis. It burns when she urinates. Nurse Assessment Nurse: Joline Salt, RN, Malachy Mood Date/Time Eilene Ghazi Time): 01/04/2017 12:32:13 PM Confirm and document reason for call. If symptomatic, describe symptoms. ---Caller states she has a bad and painful case of cystitis. It burns and spasms when she urinates. No fever. It started this am. Does the patient have any new or worsening symptoms? ---Yes Will a triage be completed? ---Yes Related visit to physician within the last 2 weeks? ---No Does the PT have any chronic conditions? (i.e. diabetes, asthma, etc.) ---No Is this a behavioral health or substance abuse call? ---No Guidelines Guideline Title Affirmed Question Affirmed Notes Urination Pain - Female Artificial heart valve or artificial joint Final Disposition User See Physician within 4 Hours (or PCP triage) Joline Salt, RN, [name] states she lives at a retirement facility and does not have transportation to [place] to the MD office. The nurse at the facility gave her a urine specimen cup but they have to have an order from her MD for medication. Patient is wondering if Dr Ronnald Ramp would look at her records and treat her for cystitis and bladder spasms as she has had this several times before. Patient's phone # is: [phone] Referrals GO TO FACILITY REFUSED Disagree/Comply: Disagree Disagree/Comply Reason: Unable to find transportation

## 2017-01-04 NOTE — Telephone Encounter (Signed)
Recd fax stating that patient lives in retirement facility, has no transportation to [place]---she is having burning and spasms when she urinates, believes she has case of cystitis like she has had before, was wanting dr Ronnald Ramp to give order for retirement home to use medication he wants to treat her with---routing to dr Ronnald Ramp, please advise, thanks

## 2017-01-05 ENCOUNTER — Other Ambulatory Visit: Payer: Self-pay | Admitting: Internal Medicine

## 2017-01-05 DIAGNOSIS — N3000 Acute cystitis without hematuria: Secondary | ICD-10-CM

## 2017-01-05 MED ORDER — NITROFURANTOIN MONOHYD MACRO 100 MG PO CAPS: 100.0000 mg | ORAL_CAPSULE | Freq: Two times a day (BID) | ORAL | 0 refills | Status: AC

## 2017-01-05 NOTE — Telephone Encounter (Signed)
Spoke with the pt. She said that Dr Ronnald Ramp was able to send something in for her because she was not able to come in.

## 2017-01-05 NOTE — Telephone Encounter (Signed)
Pls call pt to make appt w/MD ? UTI...[name]

## 2017-01-05 NOTE — Telephone Encounter (Signed)
Pharmacy advised, they will deliver this afternoon

## 2017-01-05 NOTE — Telephone Encounter (Signed)
Which pharmacy should I use?

## 2017-01-05 NOTE — Telephone Encounter (Signed)
Pharmacy is [facility], tele [phone] will deliver but we have to request that service when we call in rx---I can call them after you print and sign rx to make sure they deliver today, if ok with you---thanks

## 2017-01-05 NOTE — Telephone Encounter (Signed)
RX sent Ask them to deliver

## 2017-01-05 NOTE — Telephone Encounter (Signed)
OV w/any provider Thx 

## 2017-01-26 ENCOUNTER — Telehealth: Payer: Self-pay | Admitting: Internal Medicine

## 2017-01-26 DIAGNOSIS — R350 Frequency of micturition: Secondary | ICD-10-CM

## 2017-01-26 MED ORDER — CEPHALEXIN 500 MG PO CAPS: 500.0000 mg | ORAL_CAPSULE | Freq: Two times a day (BID) | ORAL | 0 refills | Status: DC

## 2017-01-26 NOTE — Telephone Encounter (Signed)
Please advise in PCP absence.  

## 2017-01-26 NOTE — Telephone Encounter (Signed)
Pt said that she is in a retirement home and is not able to come here. They have a heath office there and she is going to see if they can do a Urinalysis and Urine Culture so that something can be called in for her.

## 2017-01-26 NOTE — Telephone Encounter (Signed)
Orders can be sent to [facility]  Attn: [facility] Fax # [phone] Phone # [phone]  She said they would let us know the results.

## 2017-01-26 NOTE — Telephone Encounter (Signed)
Order has been faxed over 

## 2017-01-26 NOTE — Telephone Encounter (Signed)
Pt called stating that her UTI has returned. She said that she has bladder spasms and it causes urine to run down her leg so she cannot leave her house. She would like something called in to help this. Pt uses [facility]. Please advise.

## 2017-01-27 DIAGNOSIS — R350 Frequency of micturition: Secondary | ICD-10-CM | POA: Diagnosis not present

## 2017-01-27 MED ORDER — CEPHALEXIN 500 MG PO CAPS: 500.0000 mg | ORAL_CAPSULE | Freq: Two times a day (BID) | ORAL | 0 refills | Status: DC

## 2017-01-27 NOTE — Addendum Note (Signed)
Addended by: Earnstine Regal on: 01/27/2017 11:07 AM   Modules accepted: Orders

## 2017-01-27 NOTE — Telephone Encounter (Signed)
Resent rx that was rx 10/4 to requested pharmacy...[name]

## 2017-01-27 NOTE — Telephone Encounter (Signed)
[place], [place] [phone] (Phone) [phone] (Fax)   Medication needs to be set to this pharmacy please.

## 2017-02-20 DIAGNOSIS — Z23 Encounter for immunization: Secondary | ICD-10-CM | POA: Diagnosis not present

## 2017-03-06 DIAGNOSIS — M5136 Other intervertebral disc degeneration, lumbar region: Secondary | ICD-10-CM | POA: Diagnosis not present

## 2017-03-06 DIAGNOSIS — G894 Chronic pain syndrome: Secondary | ICD-10-CM | POA: Diagnosis not present

## 2017-03-28 DIAGNOSIS — L570 Actinic keratosis: Secondary | ICD-10-CM | POA: Diagnosis not present

## 2017-03-28 DIAGNOSIS — L578 Other skin changes due to chronic exposure to nonionizing radiation: Secondary | ICD-10-CM | POA: Diagnosis not present

## 2017-04-24 DIAGNOSIS — H353134 Nonexudative age-related macular degeneration, bilateral, advanced atrophic with subfoveal involvement: Secondary | ICD-10-CM | POA: Diagnosis not present

## 2017-04-24 DIAGNOSIS — H0100A Unspecified blepharitis right eye, upper and lower eyelids: Secondary | ICD-10-CM | POA: Diagnosis not present

## 2017-04-24 DIAGNOSIS — H52201 Unspecified astigmatism, right eye: Secondary | ICD-10-CM | POA: Diagnosis not present

## 2017-04-24 DIAGNOSIS — H04123 Dry eye syndrome of bilateral lacrimal glands: Secondary | ICD-10-CM | POA: Diagnosis not present

## 2017-06-14 DIAGNOSIS — Z79891 Long term (current) use of opiate analgesic: Secondary | ICD-10-CM | POA: Diagnosis not present

## 2017-06-14 DIAGNOSIS — M545 Low back pain: Secondary | ICD-10-CM | POA: Diagnosis not present

## 2017-06-14 DIAGNOSIS — G894 Chronic pain syndrome: Secondary | ICD-10-CM | POA: Diagnosis not present

## 2017-06-22 DIAGNOSIS — H353212 Exudative age-related macular degeneration, right eye, with inactive choroidal neovascularization: Secondary | ICD-10-CM | POA: Diagnosis not present

## 2017-06-22 DIAGNOSIS — H353134 Nonexudative age-related macular degeneration, bilateral, advanced atrophic with subfoveal involvement: Secondary | ICD-10-CM | POA: Diagnosis not present

## 2017-06-22 DIAGNOSIS — H353222 Exudative age-related macular degeneration, left eye, with inactive choroidal neovascularization: Secondary | ICD-10-CM | POA: Diagnosis not present

## 2017-06-22 DIAGNOSIS — H04129 Dry eye syndrome of unspecified lacrimal gland: Secondary | ICD-10-CM | POA: Diagnosis not present

## 2017-06-22 DIAGNOSIS — H35051 Retinal neovascularization, unspecified, right eye: Secondary | ICD-10-CM | POA: Diagnosis not present

## 2017-07-05 DIAGNOSIS — H401133 Primary open-angle glaucoma, bilateral, severe stage: Secondary | ICD-10-CM | POA: Diagnosis not present

## 2017-07-14 ENCOUNTER — Telehealth: Payer: Self-pay | Admitting: Internal Medicine

## 2017-07-14 MED ORDER — CEPHALEXIN 500 MG PO CAPS
500.0000 mg | ORAL_CAPSULE | Freq: Two times a day (BID) | ORAL | 0 refills | Status: DC
Start: 2017-07-14 — End: 2017-11-09

## 2017-07-14 MED ORDER — CEPHALEXIN 500 MG PO CAPS: 500.0000 mg | ORAL_CAPSULE | Freq: Two times a day (BID) | ORAL | 0 refills | Status: DC

## 2017-07-14 NOTE — Telephone Encounter (Signed)
Rx has been sent as requested; note to pharmacist requesting delivery.

## 2017-07-14 NOTE — Telephone Encounter (Signed)
I will send in [place] for her; Are we sending to pharmacy on file?

## 2017-07-14 NOTE — Telephone Encounter (Signed)
Copied from [place]. Topic: Quick Communication - See Telephone Encounter >> Jul 14, 2017 11:49 AM Percell Belt A wrote: CRM for notification. See Telephone encounter for: 07/14/17.  Pt called in and states that she has woke up with a serve UTI.  She stated she is disabled and can not come in.  She is in a home in [place].  She said Dr Ronnald Ramp has been giving her meds for this.  She is aware Dr Ronnald Ramp is not in the office today.  She said that she is hurting and would like for someone to address it today   Best number  [phone]

## 2017-07-14 NOTE — Telephone Encounter (Signed)
Patient would like Rx called into [facility]  [address] [place], [place], [place] [address] [phone] and to remind pharmacist to please delivery Rx to home.Patient would like to ensue Rx is called in today due to the discomfort she's experiencing

## 2017-07-14 NOTE — Telephone Encounter (Signed)
Last request for ABX was on 01/26/2017. Pt is in Assisted living facility. Rx for abx on 01/26/2017 was ceflex.   PCP is not in today, can you advise.   Attn: [facility] Fax # [phone] Phone # [phone]

## 2017-07-17 NOTE — Telephone Encounter (Signed)
Spoke with patient this morning and she was able to get her script. Patient thankful for call and medication being phoned in.

## 2017-08-02 ENCOUNTER — Telehealth: Payer: Self-pay | Admitting: Internal Medicine

## 2017-08-02 NOTE — Telephone Encounter (Signed)
Copied from [place] [id number]. Topic: Quick Communication - See Telephone Encounter >> Aug 02, 2017 11:28 AM Corie Chiquito, NT wrote: CRM for notification. See Telephone encounter for: 08/02/17. Patient calling because she would like to know if Dr.Jones could write her a standing order for antibiotics for her UTI's. She would like to have the prescription sent to the [facility] [phone]. Stated that she just would like the prescription on hand when she has another UTI because it takes a while for her to get the medication from an outside pharmacy, due to the fact that she is in an assisted living facility. If someone Dr.Jones or his nurse could give her a call back about this at [phone]

## 2017-08-03 NOTE — Telephone Encounter (Signed)
Pt is requesting abx for recurrent UTI. Please adivse? Will order referral if you prefer

## 2017-10-06 ENCOUNTER — Telehealth: Payer: Self-pay | Admitting: Emergency Medicine

## 2017-10-06 NOTE — Telephone Encounter (Signed)
Called patient to schedule AWV. Patient will call back at later date to schedule. 

## 2017-10-09 DIAGNOSIS — H401133 Primary open-angle glaucoma, bilateral, severe stage: Secondary | ICD-10-CM | POA: Diagnosis not present

## 2017-10-27 DIAGNOSIS — M5416 Radiculopathy, lumbar region: Secondary | ICD-10-CM | POA: Diagnosis not present

## 2017-10-30 DIAGNOSIS — H401133 Primary open-angle glaucoma, bilateral, severe stage: Secondary | ICD-10-CM | POA: Diagnosis not present

## 2017-10-30 DIAGNOSIS — Z961 Presence of intraocular lens: Secondary | ICD-10-CM | POA: Diagnosis not present

## 2017-11-09 ENCOUNTER — Encounter: Payer: Self-pay | Admitting: Internal Medicine

## 2017-11-09 ENCOUNTER — Other Ambulatory Visit (INDEPENDENT_AMBULATORY_CARE_PROVIDER_SITE_OTHER): Payer: Medicare Other

## 2017-11-09 ENCOUNTER — Ambulatory Visit (INDEPENDENT_AMBULATORY_CARE_PROVIDER_SITE_OTHER): Payer: Medicare Other | Admitting: Internal Medicine

## 2017-11-09 VITALS — BP 130/74 | HR 69 | Temp 98.5°F | Resp 16 | Ht 61.0 in | Wt 121.5 lb

## 2017-11-09 DIAGNOSIS — D539 Nutritional anemia, unspecified: Secondary | ICD-10-CM

## 2017-11-09 DIAGNOSIS — E785 Hyperlipidemia, unspecified: Secondary | ICD-10-CM

## 2017-11-09 DIAGNOSIS — Z0000 Encounter for general adult medical examination without abnormal findings: Secondary | ICD-10-CM | POA: Diagnosis not present

## 2017-11-09 DIAGNOSIS — K5904 Chronic idiopathic constipation: Secondary | ICD-10-CM | POA: Diagnosis not present

## 2017-11-09 DIAGNOSIS — F5104 Psychophysiologic insomnia: Secondary | ICD-10-CM

## 2017-11-09 DIAGNOSIS — N390 Urinary tract infection, site not specified: Secondary | ICD-10-CM

## 2017-11-09 LAB — COMPREHENSIVE METABOLIC PANEL
ALBUMIN: 3.7 g/dL (ref 3.5–5.2)
ALK PHOS: 71 U/L (ref 39–117)
ALT: 13 U/L (ref 0–35)
AST: 13 U/L (ref 0–37)
BILIRUBIN TOTAL: 0.5 mg/dL (ref 0.2–1.2)
BUN: 22 mg/dL (ref 6–23)
CO2: 31 meq/L (ref 19–32)
CREATININE: 1.09 mg/dL (ref 0.40–1.20)
Calcium: 10 mg/dL (ref 8.4–10.5)
Chloride: 102 mEq/L (ref 96–112)
GFR: 50.14 mL/min — ABNORMAL LOW (ref >60.00)
Glucose, Bld: 91 mg/dL (ref 70–99)
Potassium: 4 mEq/L (ref 3.5–5.1)
Sodium: 139 mEq/L (ref 135–145)
TOTAL PROTEIN: 7.5 g/dL (ref 6.0–8.3)

## 2017-11-09 LAB — CBC WITH DIFFERENTIAL/PLATELET
BASOS ABS: 0.1 10*3/uL (ref 0.0–0.1)
Basophils Relative: 1.3 % (ref 0.0–3.0)
Eosinophils Absolute: 0.2 10*3/uL (ref 0.0–0.7)
Eosinophils Relative: 1.7 % (ref 0.0–5.0)
HCT: 38.7 % (ref 36.0–46.0)
Hemoglobin: 12.7 g/dL (ref 12.0–15.0)
LYMPHS ABS: 1.7 10*3/uL (ref 0.7–4.0)
Lymphocytes Relative: 15.3 % (ref 12.0–46.0)
MCHC: 32.7 g/dL (ref 30.0–36.0)
MCV: 89.7 fl (ref 78.0–100.0)
Monocytes Absolute: 0.9 10*3/uL (ref 0.1–1.0)
Monocytes Relative: 8 % (ref 3.0–12.0)
NEUTROS ABS: 8 10*3/uL — AB (ref 1.4–7.7)
Neutrophils Relative %: 73.7 % (ref 43.0–77.0)
PLATELETS: 348 10*3/uL (ref 150.0–400.0)
RBC: 4.32 Mil/uL (ref 3.87–5.11)
RDW: 13.8 % (ref 11.5–15.5)
WBC: 10.9 10*3/uL — ABNORMAL HIGH (ref 4.0–10.5)

## 2017-11-09 LAB — LIPID PANEL
CHOL/HDL RATIO: 3
Cholesterol: 211 mg/dL — ABNORMAL HIGH (ref 0–200)
HDL: 74.4 mg/dL (ref >39.00)
LDL Cholesterol: 118 mg/dL — ABNORMAL HIGH (ref 0–99)
NonHDL: 136.29
TRIGLYCERIDES: 91 mg/dL (ref 0.0–149.0)
VLDL: 18.2 mg/dL (ref 0.0–40.0)

## 2017-11-09 LAB — IBC PANEL
IRON: 63 ug/dL (ref 42–145)
Saturation Ratios: 14.3 % — ABNORMAL LOW (ref 20.0–50.0)
TRANSFERRIN: 315 mg/dL (ref 212.0–360.0)

## 2017-11-09 LAB — TSH: TSH: 4.96 u[IU]/mL — AB (ref 0.35–4.50)

## 2017-11-09 LAB — FOLATE: Folate: 15.3 ng/mL (ref >5.9)

## 2017-11-09 LAB — VITAMIN B12: Vitamin B-12: 417 pg/mL (ref 211–911)

## 2017-11-09 LAB — FERRITIN: Ferritin: 85 ng/mL (ref 10.0–291.0)

## 2017-11-09 MED ORDER — NITROFURANTOIN MONOHYD MACRO 100 MG PO CAPS: 100.0000 mg | ORAL_CAPSULE | Freq: Two times a day (BID) | ORAL | 2 refills | Status: AC

## 2017-11-09 MED ORDER — LINACLOTIDE 72 MCG PO CAPS: 72.0000 ug | ORAL_CAPSULE | Freq: Every day | ORAL | 1 refills | Status: DC

## 2017-11-09 MED ORDER — ZOLPIDEM TARTRATE 5 MG PO TABS: 5.0000 mg | ORAL_TABLET | Freq: Every evening | ORAL | 5 refills | Status: DC | PRN

## 2017-11-09 NOTE — Progress Notes (Signed)
Subjective:  Patient ID: [name], female    DOB: 1925-09-25  Age: 84 y.o. MRN: [id number]  CC: Urinary Tract Infection and Annual Exam   HPI ZAINEB NOWACZYK presents for a CPX.  She complains that she frequently has UTIs and wants an antibiotic prescription ordered so that she can call and have it filled in the event that she develops an infection.  She currently does not have any symptoms and specifically denies dysuria, hematuria, frequency or urgency.    She complains of chronic low back pain which is treated with morphine.  She also complains of persistent insomnia.  She tried [place] but says it did not help.  She wants a prescription for Ambien which she tells me is about the only thing that will treat her insomnia.    She also complains of chronic, intermittent constipation and tells me that she frequently has to use stimulants like Senokot or bisacodyl.   Past Medical History:  Diagnosis Date  . Adenomatous colon polyp 04/26/95   tubulovillous  . Allergy   . Arthritis   . Bradycardia   . Constipation   . COPD, moderate ([place])   . Degenerative joint disease   . Depression   . Gastritis   . GERD (gastroesophageal reflux disease)   . Glaucoma   . Insomnia   . Lack of bladder control   . Macular degeneration   . OA (osteoarthritis)   . Renal cyst    right  . Shortness of breath   . Small bowel obstruction ([place])   . Trigeminal neuralgia    Past Surgical History:  Procedure Laterality Date  . ABDOMINAL HYSTERECTOMY     nonmalignant reasons  . BACK SURGERY  05/20/08  . CATARACT EXTRACTION     left  . CHOLECYSTECTOMY    . DILATION AND CURETTAGE OF UTERUS    . JOINT REPLACEMENT     right hip  . PARTIAL COLECTOMY  04/26/95   tubulovillous adenoma  . SHOULDER SURGERY  12/24/08   left  . TONSILLECTOMY    . TOTAL SHOULDER ARTHROPLASTY Right 07/05/2012   Procedure: RIGHT TOTAL SHOULDER ARTHROPLASTY;  Surgeon: Marin Shutter, MD;  Location: [facility];  Service:  Orthopedics;  Laterality: Right;  Right total shoulder arthroplasty    reports that she quit smoking about 12 years ago. Her smoking use included cigarettes. She has a 50.00 pack-year smoking history. She has never used smokeless tobacco. She reports that she does not drink alcohol or use drugs. family history includes Cancer in her father; Colon polyps in her brother; Diabetes in her brother; Heart disease in her sister. Allergies  Allergen Reactions  . Codeine Itching and Nausea Only    Small amounts okay  . Pregabalin Other (See Comments)    Confusion and hallucination  . Promethazine Hcl Other (See Comments)    Muscle cramps  . Sulfonamide Derivatives Nausea Only    Outpatient Medications Prior to Visit  Medication Sig Dispense Refill  . dorzolamide-timolol (COSOPT) 22.3-6.8 MG/ML ophthalmic solution Place 1 drop into both eyes 2 times daily.    . Travoprost, BAK Free, (TRAVATAN Z) 0.004 % SOLN ophthalmic solution Travatan Z 0.004 % eye drops    . acetaminophen (TYLENOL) 500 MG tablet Take 2 tablets (1,000 mg total) by mouth every 6 (six) hours as needed. 30 tablet 0  . ALPHAGAN P 0.1 % SOLN     . beta carotene w/minerals (OCUVITE) tablet Take 1 tablet by mouth 2 (two)  times daily.    Marland Kitchen latanoprost (XALATAN) 0.005 % ophthalmic solution Place 1 drop into both eyes at bedtime.    Marland Kitchen morphine (MSIR) 15 MG tablet Take 1 tablet (15 mg total) by mouth every 6 (six) hours. 120 tablet 0  . omeprazole (PRILOSEC) 20 MG capsule Take 20 mg by mouth every morning.     Marland Kitchen aspirin EC 81 MG tablet Take 81 mg by mouth every evening.     . bisacodyl (DULCOLAX) 10 MG suppository Place 1 suppository (10 mg total) rectally daily as needed for moderate constipation. 12 suppository 0  . cephALEXin (KEFLEX) 500 MG capsule Take 1 capsule (500 mg total) by mouth 2 (two) times daily. 14 capsule 0  . eszopiclone (LUNESTA) 2 MG TABS tablet Take 1 tablet (2 mg total) by mouth at bedtime as needed for sleep. Take  immediately before bedtime 30 tablet 5  . polyethylene glycol (MIRALAX / GLYCOLAX) packet Take 17 g by mouth daily.    Marland Kitchen senna-docusate (SENOKOT-S) 8.6-50 MG tablet Take 1 tablet by mouth 2 (two) times daily.    . timolol (BETIMOL) 0.5 % ophthalmic solution Place 1 drop into both eyes 2 (two) times daily.      No facility-administered medications prior to visit.     ROS Review of Systems  Constitutional: Negative for chills, diaphoresis, fatigue and fever.  HENT: Negative.   Eyes: Positive for visual disturbance (poor vision).  Respiratory: Negative for cough, chest tightness and shortness of breath.   Cardiovascular: Negative for chest pain, palpitations and leg swelling.  Gastrointestinal: Positive for constipation. Negative for abdominal pain, diarrhea and vomiting.  Genitourinary: Negative.  Negative for decreased urine volume, difficulty urinating, dysuria, frequency, hematuria and urgency.  Musculoskeletal: Positive for back pain. Negative for neck pain.  Skin: Negative.  Negative for color change and pallor.  Neurological: Negative.  Negative for dizziness, weakness, light-headedness and headaches.  Hematological: Negative.  Negative for adenopathy. Does not bruise/bleed easily.  Psychiatric/Behavioral: Positive for decreased concentration and sleep disturbance. Negative for dysphoric mood, self-injury and suicidal ideas. The patient is not nervous/anxious.     Objective:  BP 130/74 (BP Location: Left Arm, Patient Position: Sitting, Cuff Size: Normal)   Pulse 69   Temp 98.5 F (36.9 C) (Oral)   Resp 16   Ht 5\' 1"  (1.549 m)   Wt 121 lb 8 oz (55.1 kg)   SpO2 97%   BMI 22.96 kg/m   BP Readings from Last 3 Encounters:  11/09/17 130/74  08/31/16 140/60  04/23/16 (!) 153/56    Wt Readings from Last 3 Encounters:  11/09/17 121 lb 8 oz (55.1 kg)  08/31/16 119 lb 4 oz (54.1 kg)  04/23/16 116 lb (52.6 kg)    Physical Exam  Constitutional: She is oriented to person,  place, and time. No distress.  HENT:  Mouth/Throat: Oropharynx is clear and moist. No oropharyngeal exudate.  Eyes: Conjunctivae are normal. No scleral icterus.  Neck: Normal range of motion. Neck supple. No JVD present. No thyromegaly present.  Cardiovascular: Normal rate, regular rhythm and normal heart sounds. Exam reveals no friction rub.  No murmur heard. Pulmonary/Chest: Effort normal and breath sounds normal. She has no wheezes. She has no rales.  Abdominal: Soft. Bowel sounds are normal. There is no hepatosplenomegaly. There is no tenderness.  Musculoskeletal: Normal range of motion. She exhibits no edema, tenderness or deformity.  Lymphadenopathy:    She has no cervical adenopathy.  Neurological: She is alert and oriented to person,  place, and time.  Skin: Skin is warm and dry. She is not diaphoretic. No pallor.  Psychiatric: She has a normal mood and affect. Her behavior is normal. Judgment and thought content normal.  Vitals reviewed.   Lab Results  Component Value Date   WBC 10.9 (H) 11/09/2017   HGB 12.7 11/09/2017   HCT 38.7 11/09/2017   PLT 348.0 11/09/2017   GLUCOSE 91 11/09/2017   CHOL 211 (H) 11/09/2017   TRIG 91.0 11/09/2017   HDL 74.40 11/09/2017   LDLDIRECT 106.0 09/03/2007   LDLCALC 118 (H) 11/09/2017   ALT 13 11/09/2017   AST 13 11/09/2017   NA 139 11/09/2017   K 4.0 11/09/2017   CL 102 11/09/2017   CREATININE 1.09 11/09/2017   BUN 22 11/09/2017   CO2 31 11/09/2017   TSH 4.96 (H) 11/09/2017   INR 0.92 06/28/2012    Ct Head Wo Contrast  Result Date: 04/23/2016 CLINICAL DATA:  Hit head.  Fall. EXAM: CT HEAD WITHOUT CONTRAST CT CERVICAL SPINE WITHOUT CONTRAST TECHNIQUE: Multidetector CT imaging of the head and cervical spine was performed following the standard protocol without intravenous contrast. Multiplanar CT image reconstructions of the cervical spine were also generated. COMPARISON:  02/06/2016 FINDINGS: CT HEAD FINDINGS Brain: There is atrophy  and chronic small vessel disease changes. No acute intracranial abnormality. Specifically, no hemorrhage, hydrocephalus, mass lesion, acute infarction, or significant intracranial injury. Vascular: No hyperdense vessel or unexpected calcification. Skull: No acute calvarial abnormality. Sinuses/Orbits: Visualized paranasal sinuses and mastoids clear. Orbital soft tissues unremarkable. Other: None CT CERVICAL SPINE FINDINGS Alignment: Slight anterolisthesis of C3 on C4 related to facet disease. Skull base and vertebrae: No acute fracture. No primary bone lesion or focal pathologic process. Soft tissues and spinal canal: No prevertebral fluid or swelling. No visible canal hematoma. Disc levels: Diffuse degenerative disc disease with disc space narrowing and spurring. Diffuse bilateral degenerative facet disease. Upper chest: Negative Other: None IMPRESSION: No acute intracranial abnormality.Atrophy, chronic microvascular disease. Spondylosis.  No acute bony abnormality in the cervical spine. Electronically Signed   By: Rolm Baptise M.D.   On: 04/23/2016 13:49   Ct Cervical Spine Wo Contrast  Result Date: 04/23/2016 CLINICAL DATA:  Hit head.  Fall. EXAM: CT HEAD WITHOUT CONTRAST CT CERVICAL SPINE WITHOUT CONTRAST TECHNIQUE: Multidetector CT imaging of the head and cervical spine was performed following the standard protocol without intravenous contrast. Multiplanar CT image reconstructions of the cervical spine were also generated. COMPARISON:  02/06/2016 FINDINGS: CT HEAD FINDINGS Brain: There is atrophy and chronic small vessel disease changes. No acute intracranial abnormality. Specifically, no hemorrhage, hydrocephalus, mass lesion, acute infarction, or significant intracranial injury. Vascular: No hyperdense vessel or unexpected calcification. Skull: No acute calvarial abnormality. Sinuses/Orbits: Visualized paranasal sinuses and mastoids clear. Orbital soft tissues unremarkable. Other: None CT CERVICAL SPINE  FINDINGS Alignment: Slight anterolisthesis of C3 on C4 related to facet disease. Skull base and vertebrae: No acute fracture. No primary bone lesion or focal pathologic process. Soft tissues and spinal canal: No prevertebral fluid or swelling. No visible canal hematoma. Disc levels: Diffuse degenerative disc disease with disc space narrowing and spurring. Diffuse bilateral degenerative facet disease. Upper chest: Negative Other: None IMPRESSION: No acute intracranial abnormality.Atrophy, chronic microvascular disease. Spondylosis.  No acute bony abnormality in the cervical spine. Electronically Signed   By: Rolm Baptise M.D.   On: 04/23/2016 13:49    Assessment & Plan:   Armanie was seen today for urinary tract infection and annual exam.  Diagnoses  and all orders for this visit:  Chronic idiopathic constipation- Her labs are negative for secondary causes.  I have asked her to try Linzess for symptom relief. -     TSH; Future -     Comprehensive metabolic panel; Future -     linaclotide (LINZESS) 72 MCG capsule; Take 1 capsule (72 mcg total) by mouth daily before breakfast.  Psychophysiological insomnia -     zolpidem (AMBIEN) 5 MG tablet; Take 1 tablet (5 mg total) by mouth at bedtime as needed for sleep.  Hyperlipidemia with target LDL less than 160- Statin therapy is not indicated -     Lipid panel; Future -     TSH; Future -     Comprehensive metabolic panel; Future  Deficiency anemia- Her H&H are normal, her vitamin levels are normal -     CBC with Differential/Platelet; Future -     Ferritin; Future -     Folate; Future -     Vitamin B12; Future -     IBC panel; Future -     Vitamin B1; Future  Frequent UTI -     nitrofurantoin, macrocrystal-monohydrate, (MACROBID) 100 MG capsule; Take 1 capsule (100 mg total) by mouth 2 (two) times daily for 5 days.   I have discontinued Lillyth M. Lopez's aspirin EC, zolpidem, timolol, bisacodyl, senna-docusate, polyethylene glycol,  eszopiclone, and cephALEXin. I am also having her start on zolpidem, linaclotide, and nitrofurantoin (macrocrystal-monohydrate). Additionally, I am having her maintain her omeprazole, beta carotene w/minerals, acetaminophen, latanoprost, morphine, ALPHAGAN P, dorzolamide-timolol, and Travoprost (BAK Free).  Meds ordered this encounter  Medications  . zolpidem (AMBIEN) 5 MG tablet    Sig: Take 1 tablet (5 mg total) by mouth at bedtime as needed for sleep.    Dispense:  30 tablet    Refill:  5  . linaclotide (LINZESS) 72 MCG capsule    Sig: Take 1 capsule (72 mcg total) by mouth daily before breakfast.    Dispense:  90 capsule    Refill:  1  . nitrofurantoin, macrocrystal-monohydrate, (MACROBID) 100 MG capsule    Sig: Take 1 capsule (100 mg total) by mouth 2 (two) times daily for 5 days.    Dispense:  10 capsule    Refill:  2   See AVS for instructions about healthy living and anticipatory guidance.  Follow-up: Return if symptoms worsen or fail to improve.  Scarlette Calico, MD

## 2017-11-09 NOTE — Patient Instructions (Signed)

## 2017-11-13 LAB — VITAMIN B1: VITAMIN B1 (THIAMINE): 11 nmol/L (ref 8–30)

## 2017-11-13 NOTE — Assessment & Plan Note (Signed)

## 2017-11-24 ENCOUNTER — Other Ambulatory Visit: Payer: Self-pay

## 2017-12-11 DIAGNOSIS — H401133 Primary open-angle glaucoma, bilateral, severe stage: Secondary | ICD-10-CM | POA: Diagnosis not present

## 2017-12-18 ENCOUNTER — Ambulatory Visit: Payer: Medicare Other

## 2018-01-26 DIAGNOSIS — M545 Low back pain: Secondary | ICD-10-CM | POA: Diagnosis not present

## 2018-01-26 DIAGNOSIS — G894 Chronic pain syndrome: Secondary | ICD-10-CM | POA: Diagnosis not present

## 2018-01-26 DIAGNOSIS — M5136 Other intervertebral disc degeneration, lumbar region: Secondary | ICD-10-CM | POA: Diagnosis not present

## 2018-02-07 DIAGNOSIS — L820 Inflamed seborrheic keratosis: Secondary | ICD-10-CM | POA: Diagnosis not present

## 2018-02-07 DIAGNOSIS — L570 Actinic keratosis: Secondary | ICD-10-CM | POA: Diagnosis not present

## 2018-02-14 DIAGNOSIS — Z23 Encounter for immunization: Secondary | ICD-10-CM | POA: Diagnosis not present

## 2018-03-20 DIAGNOSIS — H353212 Exudative age-related macular degeneration, right eye, with inactive choroidal neovascularization: Secondary | ICD-10-CM | POA: Diagnosis not present

## 2018-03-20 DIAGNOSIS — H353134 Nonexudative age-related macular degeneration, bilateral, advanced atrophic with subfoveal involvement: Secondary | ICD-10-CM | POA: Diagnosis not present

## 2018-03-20 DIAGNOSIS — H353222 Exudative age-related macular degeneration, left eye, with inactive choroidal neovascularization: Secondary | ICD-10-CM | POA: Diagnosis not present

## 2018-03-20 DIAGNOSIS — H35351 Cystoid macular degeneration, right eye: Secondary | ICD-10-CM | POA: Diagnosis not present

## 2018-03-21 DIAGNOSIS — M79644 Pain in right finger(s): Secondary | ICD-10-CM | POA: Diagnosis not present

## 2018-03-21 DIAGNOSIS — M25551 Pain in right hip: Secondary | ICD-10-CM | POA: Diagnosis not present

## 2018-03-26 DIAGNOSIS — H401133 Primary open-angle glaucoma, bilateral, severe stage: Secondary | ICD-10-CM | POA: Diagnosis not present

## 2018-05-28 DIAGNOSIS — M25551 Pain in right hip: Secondary | ICD-10-CM | POA: Diagnosis not present

## 2018-05-28 DIAGNOSIS — Z79891 Long term (current) use of opiate analgesic: Secondary | ICD-10-CM | POA: Diagnosis not present

## 2018-05-28 DIAGNOSIS — G894 Chronic pain syndrome: Secondary | ICD-10-CM | POA: Diagnosis not present

## 2018-05-28 DIAGNOSIS — M545 Low back pain: Secondary | ICD-10-CM | POA: Diagnosis not present

## 2018-05-28 DIAGNOSIS — M1611 Unilateral primary osteoarthritis, right hip: Secondary | ICD-10-CM | POA: Diagnosis not present

## 2018-06-01 ENCOUNTER — Other Ambulatory Visit: Payer: Self-pay | Admitting: Physical Medicine and Rehabilitation

## 2018-06-01 DIAGNOSIS — M1611 Unilateral primary osteoarthritis, right hip: Secondary | ICD-10-CM

## 2018-06-11 ENCOUNTER — Ambulatory Visit
Admission: RE | Admit: 2018-06-11 | Discharge: 2018-06-11 | Disposition: A | Discharge status: Home / Self Care | Payer: Medicare Other | Source: Ambulatory Visit | Attending: Physical Medicine and Rehabilitation | Admitting: Physical Medicine and Rehabilitation

## 2018-06-11 DIAGNOSIS — M1611 Unilateral primary osteoarthritis, right hip: Secondary | ICD-10-CM | POA: Diagnosis not present

## 2018-06-11 MED ORDER — METHYLPREDNISOLONE ACETATE 40 MG/ML INJ SUSP (RADIOLOG
120.0000 mg | Freq: Once | INTRAMUSCULAR | Status: AC
  Administered 2018-06-11: 120 mg via INTRA_ARTICULAR

## 2018-06-11 MED ORDER — IOPAMIDOL (ISOVUE-M 200) INJECTION 41%
1.0000 mL | Freq: Once | INTRAMUSCULAR | Status: AC
  Administered 2018-06-11: 1 mL via INTRA_ARTICULAR

## 2018-07-25 DIAGNOSIS — G894 Chronic pain syndrome: Secondary | ICD-10-CM | POA: Diagnosis not present

## 2018-07-25 DIAGNOSIS — M25551 Pain in right hip: Secondary | ICD-10-CM | POA: Diagnosis not present

## 2018-07-25 DIAGNOSIS — M1611 Unilateral primary osteoarthritis, right hip: Secondary | ICD-10-CM | POA: Diagnosis not present

## 2018-07-25 DIAGNOSIS — Z79891 Long term (current) use of opiate analgesic: Secondary | ICD-10-CM | POA: Diagnosis not present

## 2018-07-25 DIAGNOSIS — M545 Low back pain: Secondary | ICD-10-CM | POA: Diagnosis not present

## 2018-08-28 DIAGNOSIS — M1611 Unilateral primary osteoarthritis, right hip: Secondary | ICD-10-CM | POA: Diagnosis not present

## 2018-09-03 ENCOUNTER — Telehealth: Payer: Self-pay | Admitting: Internal Medicine

## 2018-09-03 NOTE — Telephone Encounter (Signed)
Copied from [place] [phone]. Topic: General - Other >> Sep 03, 2018  9:29 AM Parke Poisson wrote: Reason for CRM: [name] (Pt's caregiver) called wanting to know if office received paperwork to be filled out for surgery clearance.She would like call back at [phone]

## 2018-09-04 NOTE — Telephone Encounter (Signed)
Left message for  Lucita Ferrara informing that we have received her paper work for surgical clearance.

## 2018-09-24 ENCOUNTER — Ambulatory Visit (INDEPENDENT_AMBULATORY_CARE_PROVIDER_SITE_OTHER): Payer: Medicare Other | Admitting: Internal Medicine

## 2018-09-24 ENCOUNTER — Other Ambulatory Visit (INDEPENDENT_AMBULATORY_CARE_PROVIDER_SITE_OTHER): Payer: Medicare Other

## 2018-09-24 ENCOUNTER — Encounter: Payer: Self-pay | Admitting: Internal Medicine

## 2018-09-24 ENCOUNTER — Other Ambulatory Visit: Payer: Self-pay

## 2018-09-24 VITALS — BP 140/62 | HR 57 | Temp 97.5°F | Resp 16 | Ht 61.0 in | Wt 124.0 lb

## 2018-09-24 DIAGNOSIS — K5904 Chronic idiopathic constipation: Secondary | ICD-10-CM | POA: Diagnosis not present

## 2018-09-24 DIAGNOSIS — Z01818 Encounter for other preprocedural examination: Secondary | ICD-10-CM

## 2018-09-24 DIAGNOSIS — D72829 Elevated white blood cell count, unspecified: Secondary | ICD-10-CM

## 2018-09-24 DIAGNOSIS — R7989 Other specified abnormal findings of blood chemistry: Secondary | ICD-10-CM | POA: Diagnosis not present

## 2018-09-24 LAB — CBC WITH DIFFERENTIAL/PLATELET
Basophils Absolute: 0 10*3/uL (ref 0.0–0.1)
Basophils Relative: 0.3 % (ref 0.0–3.0)
Eosinophils Absolute: 0.3 10*3/uL (ref 0.0–0.7)
Eosinophils Relative: 3.3 % (ref 0.0–5.0)
HCT: 38.2 % (ref 36.0–46.0)
Hemoglobin: 12.9 g/dL (ref 12.0–15.0)
Lymphocytes Relative: 29.6 % (ref 12.0–46.0)
Lymphs Abs: 2.5 10*3/uL (ref 0.7–4.0)
MCHC: 33.7 g/dL (ref 30.0–36.0)
MCV: 89.4 fl (ref 78.0–100.0)
Monocytes Absolute: 0.7 10*3/uL (ref 0.1–1.0)
Monocytes Relative: 8 % (ref 3.0–12.0)
Neutro Abs: 4.9 10*3/uL (ref 1.4–7.7)
Neutrophils Relative %: 58.8 % (ref 43.0–77.0)
Platelets: 301 10*3/uL (ref 150.0–400.0)
RBC: 4.28 Mil/uL (ref 3.87–5.11)
RDW: 13.6 % (ref 11.5–15.5)
WBC: 8.3 10*3/uL (ref 4.0–10.5)

## 2018-09-24 LAB — TSH: TSH: 5.12 u[IU]/mL — ABNORMAL HIGH (ref 0.35–4.50)

## 2018-09-24 LAB — COMPREHENSIVE METABOLIC PANEL
ALT: 13 U/L (ref 0–35)
AST: 16 U/L (ref 0–37)
Albumin: 3.9 g/dL (ref 3.5–5.2)
Alkaline Phosphatase: 66 U/L (ref 39–117)
BUN: 18 mg/dL (ref 6–23)
CO2: 24 mEq/L (ref 19–32)
Calcium: 10 mg/dL (ref 8.4–10.5)
Chloride: 107 mEq/L (ref 96–112)
Creatinine, Ser: 0.91 mg/dL (ref 0.40–1.20)
GFR: 57.98 mL/min — ABNORMAL LOW (ref >60.00)
Glucose, Bld: 87 mg/dL (ref 70–99)
Potassium: 4 mEq/L (ref 3.5–5.1)
Sodium: 140 mEq/L (ref 135–145)
Total Bilirubin: 0.4 mg/dL (ref 0.2–1.2)
Total Protein: 7.4 g/dL (ref 6.0–8.3)

## 2018-09-24 NOTE — Progress Notes (Signed)
Subjective:  Patient ID: [name], female    DOB: 11-Dec-1925  Age: 85 y.o. MRN: [id number]  CC: Constipation   HPI [name] presents for f/up - She is here for preop clearance.  She tells me that she is going to have total hip replacement soon.  She describes debilitating hip pain and takes morphine.  The pain interferes with her sleep and her activity level.  She also complains of chronic constipation.  She tried Linzess about a year ago but says it was not effective.  She is currently treating the constipation with MiraLAX as needed.  She otherwise feels well today and offers no other complaints.  Outpatient Medications Prior to Visit  Medication Sig Dispense Refill   acetaminophen (TYLENOL) 500 MG tablet Take 2 tablets (1,000 mg total) by mouth every 6 (six) hours as needed. 30 tablet 0   ALPHAGAN P 0.1 % SOLN      beta carotene w/minerals (OCUVITE) tablet Take 1 tablet by mouth 2 (two) times daily.     dorzolamide-timolol (COSOPT) 22.3-6.8 MG/ML ophthalmic solution Place 1 drop into both eyes 2 times daily.     latanoprost (XALATAN) 0.005 % ophthalmic solution Place 1 drop into both eyes at bedtime.     morphine (MSIR) 15 MG tablet Take 1 tablet (15 mg total) by mouth every 6 (six) hours. 120 tablet 0   omeprazole (PRILOSEC) 20 MG capsule Take 20 mg by mouth every morning.      Travoprost, BAK Free, (TRAVATAN Z) 0.004 % SOLN ophthalmic solution Travatan Z 0.004 % eye drops     zolpidem (AMBIEN) 5 MG tablet Take 1 tablet (5 mg total) by mouth at bedtime as needed for sleep. 30 tablet 5   linaclotide (LINZESS) 72 MCG capsule Take 1 capsule (72 mcg total) by mouth daily before breakfast. 90 capsule 1   No facility-administered medications prior to visit.     ROS Review of Systems  Constitutional: Positive for activity change and appetite change. Negative for diaphoresis, fatigue and unexpected weight change.  HENT: Negative.   Eyes: Negative for visual  disturbance.  Respiratory: Negative for cough, chest tightness, shortness of breath and wheezing.   Cardiovascular: Negative for chest pain, palpitations and leg swelling.  Gastrointestinal: Positive for constipation. Negative for abdominal pain, diarrhea, nausea and vomiting.  Endocrine: Negative.  Negative for cold intolerance and heat intolerance.  Genitourinary: Negative.  Negative for difficulty urinating.  Musculoskeletal: Positive for arthralgias, back pain and gait problem. Negative for myalgias.  Skin: Negative.   Neurological: Negative for dizziness, weakness, light-headedness and headaches.  Hematological: Negative for adenopathy. Does not bruise/bleed easily.  Psychiatric/Behavioral: Positive for confusion, decreased concentration and dysphoric mood. Negative for behavioral problems, sleep disturbance and suicidal ideas. The patient is not nervous/anxious.     Objective:  BP 140/62 (BP Location: Left Arm, Patient Position: Sitting, Cuff Size: Normal)    Pulse (!) 57    Temp (!) 97.5 F (36.4 C) (Oral)    Resp 16    Ht 5\' 1"  (1.549 m)    Wt 124 lb (56.2 kg)    SpO2 97%    BMI 23.43 kg/m   BP Readings from Last 3 Encounters:  09/24/18 140/62  11/09/17 130/74  08/31/16 140/60    Wt Readings from Last 3 Encounters:  09/24/18 124 lb (56.2 kg)  11/09/17 121 lb 8 oz (55.1 kg)  08/31/16 119 lb 4 oz (54.1 kg)    Physical Exam Vitals signs reviewed.  Constitutional:      Appearance: She is not ill-appearing or diaphoretic.  HENT:     Nose: Nose normal.     Mouth/Throat:     Mouth: Mucous membranes are moist.  Eyes:     General: No scleral icterus.    Conjunctiva/sclera: Conjunctivae normal.  Neck:     Musculoskeletal: Normal range of motion. No neck rigidity.  Cardiovascular:     Rate and Rhythm: Regular rhythm. Bradycardia present.     Heart sounds: S1 normal and S2 normal. No systolic murmur. No diastolic murmur. No gallop. No S3 or S4 sounds.      Comments: EKG  ---  Sinus  Bradycardia  -RSR(V1) -nondiagnostic.   PROBABLY NORMAL Pulmonary:     Effort: Pulmonary effort is normal.     Breath sounds: No stridor. No wheezing, rhonchi or rales.  Abdominal:     General: Abdomen is flat. Bowel sounds are normal. There is no distension.     Palpations: There is no hepatomegaly, splenomegaly or mass.     Tenderness: There is no abdominal tenderness.     Hernia: No hernia is present.  Musculoskeletal: Normal range of motion.     Right lower leg: No edema.     Left lower leg: No edema.  Lymphadenopathy:     Cervical: No cervical adenopathy.  Skin:    General: Skin is warm and dry.  Neurological:     General: No focal deficit present.     Lab Results  Component Value Date   WBC 8.3 09/24/2018   HGB 12.9 09/24/2018   HCT 38.2 09/24/2018   PLT 301.0 09/24/2018   GLUCOSE 87 09/24/2018   CHOL 211 (H) 11/09/2017   TRIG 91.0 11/09/2017   HDL 74.40 11/09/2017   LDLDIRECT 106.0 09/03/2007   LDLCALC 118 (H) 11/09/2017   ALT 13 09/24/2018   AST 16 09/24/2018   NA 140 09/24/2018   K 4.0 09/24/2018   CL 107 09/24/2018   CREATININE 0.91 09/24/2018   BUN 18 09/24/2018   CO2 24 09/24/2018   TSH 5.12 (H) 09/24/2018   INR 0.92 06/28/2012    Dg Fluoro Guided Needle Plc Aspiration/injection Loc  Result Date: 06/11/2018 CLINICAL DATA:  Right hip osteoarthritis.  Hip pain. EXAM: RIGHT HIP INJECTION UNDER FLUOROSCOPY FLUOROSCOPY TIME:  Fluoroscopy Time:  2 seconds Radiation Exposure Index (if provided by the fluoroscopic device): 5.94 microGray*m^2 Number of Acquired Spot Images: 0 PROCEDURE: The overlying skin was prepped with Betadine, draped in the usual sterile fashion, and infiltrated locally with 1% lidocaine. A 3.5 inch 22 gauge spinal needle was advanced to the lateral aspect of the right femoral head-neck junction. 1 mL of 1% lidocaine injected easily. Diagnostic injection of a small amount of Isovue-300 demonstrated intra-articular spread without  intravascular component. 120 mg of Depo-Medrol and 4 mL of 0.5% bupivacaine were then administered. The needle was removed and a sterile dressing was applied. There was no immediate complication. IMPRESSION: Technically successful right hip injection under fluoroscopy. Electronically Signed   By: Logan Bores M.D.   On: 06/11/2018 14:12    Assessment & Plan:   Vauda was seen today for constipation.  Diagnoses and all orders for this visit:  Chronic idiopathic constipation- Her labs are negative for secondary causes.  This is most likely caused by the opioid.  She is getting adequate symptom relief with MiraLAX. -     Comprehensive metabolic panel; Future -     TSH; Future  Leukocytosis, unspecified type-  Her white cell count is normal now. -     CBC with Differential/Platelet; Future  Pre-operative clearance- Surgical clearance form completed and faxed to her orthopedic surgeon. -     EKG 12-Lead  TSH elevation- Her TSH level is down to 5.12.  She appears to be euthyroid.  At her age I do not recommend thyroid replacement therapy.   I have discontinued Conchita M. Rhyner's linaclotide. I am also having her maintain her omeprazole, beta carotene w/minerals, acetaminophen, latanoprost, morphine, Alphagan P, dorzolamide-timolol, Travoprost (BAK Free), and zolpidem.  No orders of the defined types were placed in this encounter.    Follow-up: No follow-ups on file.  Scarlette Calico, MD

## 2018-09-25 ENCOUNTER — Encounter: Payer: Self-pay | Admitting: Internal Medicine

## 2018-09-25 NOTE — Patient Instructions (Signed)

## 2018-09-26 NOTE — Progress Notes (Signed)
Need orders for 6-11- surgery

## 2018-09-28 NOTE — Progress Notes (Signed)
MEDICAL CLEARANCE DR Scarlette Calico 09-24-18 Epic EKG 09-24-18 Epic CBC WITH DIF, CMET 09-24-18 EP[facility]

## 2018-09-28 NOTE — Patient Instructions (Addendum)
YOU NEED TO HAVE A COVID 19 TEST ON______6-11-2020_______, THIS TEST MUST BE DONE BEFORE SURGERY, COME TO [facility] ENTRANCE.                 LEN KLUVER     Your procedure is scheduled on: 10-04-2018   Report to [facility] Main  Entrance    Report to admitting at 9:30  AM      Call this number if you have problems the morning of surgery [phone]    Remember: Do not eat food or drink liquids :After Midnight. BRUSH YOUR TEETH MORNING OF SURGERY AND RINSE YOUR MOUTH OUT, NO CHEWING GUM CANDY OR MINTS.     Take these medicines the morning of surgery with A SIP OF WATER: EYE DROPS, OMEPRAZOLE                               You may not have any metal on your body including hair pins and              piercings  Do not wear jewelry, make-up, lotions, powders or perfumes, deodorant             Do not wear nail polish.  Do not shave  48 hours prior to surgery.                Do not bring valuables to the hospital. [place].  Contacts, dentures or bridgework may not be worn into surgery.  Leave suitcase in the car. After surgery it may be brought to your room.     _________________             [facility] - Preparing for Surgery Before surgery, you can play an important role.  Because skin is not sterile, your skin needs to be as free of germs as possible.  You can reduce the number of germs on your skin by washing with CHG (chlorahexidine gluconate) soap before surgery.  CHG is an antiseptic cleaner which kills germs and bonds with the skin to continue killing germs even after washing. Please DO NOT use if you have an allergy to CHG or antibacterial soaps.  If your skin becomes reddened/irritated stop using the CHG and inform your nurse when you arrive at Short Stay. Do not shave (including legs and underarms) for at least 48 hours prior to the first CHG shower.  You may shave your  face/neck. Please follow these instructions carefully:  1.  Shower with CHG Soap the night before surgery and the  morning of Surgery.  2.  If you choose to wash your hair, wash your hair first as usual with your  normal  shampoo.  3.  After you shampoo, rinse your hair and body thoroughly to remove the  shampoo.                           4.  Use CHG as you would any other liquid soap.  You can apply chg directly  to the skin and wash                       Gently with a scrungie or clean washcloth.  5.  Apply the CHG Soap to your body  ONLY FROM THE NECK DOWN.   Do not use on face/ open                           Wound or open sores. Avoid contact with eyes, ears mouth and genitals (private parts).                       Wash face,  Genitals (private parts) with your normal soap.             6.  Wash thoroughly, paying special attention to the area where your surgery  will be performed.  7.  Thoroughly rinse your body with warm water from the neck down.  8.  DO NOT shower/wash with your normal soap after using and rinsing off  the CHG Soap.                9.  Pat yourself dry with a clean towel.            10.  Wear clean pajamas.            11.  Place clean sheets on your bed the night of your first shower and do not  sleep with pets. Day of Surgery : Do not apply any lotions/deodorants the morning of surgery.  Please wear clean clothes to the hospital/surgery center.  FAILURE TO FOLLOW THESE INSTRUCTIONS MAY RESULT IN THE CANCELLATION OF YOUR SURGERY PATIENT SIGNATURE_________________________________  NURSE SIGNATURE__________________________________  ________________________________________________________________________

## 2018-10-01 ENCOUNTER — Other Ambulatory Visit (HOSPITAL_COMMUNITY)
Admission: RE | Admit: 2018-10-01 | Discharge: 2018-10-01 | Disposition: A | Discharge status: Home / Self Care | Payer: Medicare Other | Source: Ambulatory Visit | Attending: Orthopedic Surgery | Admitting: Orthopedic Surgery

## 2018-10-01 ENCOUNTER — Ambulatory Visit: Payer: Self-pay | Admitting: Orthopedic Surgery

## 2018-10-01 ENCOUNTER — Encounter (HOSPITAL_COMMUNITY): Payer: Self-pay

## 2018-10-01 ENCOUNTER — Other Ambulatory Visit: Payer: Self-pay

## 2018-10-01 ENCOUNTER — Encounter (HOSPITAL_COMMUNITY)
Admission: RE | Admit: 2018-10-01 | Discharge: 2018-10-01 | Disposition: A | Discharge status: Home / Self Care | Payer: Medicare Other | Source: Ambulatory Visit

## 2018-10-01 DIAGNOSIS — Z01812 Encounter for preprocedural laboratory examination: Secondary | ICD-10-CM | POA: Insufficient documentation

## 2018-10-01 DIAGNOSIS — M1611 Unilateral primary osteoarthritis, right hip: Secondary | ICD-10-CM | POA: Insufficient documentation

## 2018-10-01 DIAGNOSIS — Z1159 Encounter for screening for other viral diseases: Secondary | ICD-10-CM | POA: Insufficient documentation

## 2018-10-01 HISTORY — DX: Bursopathy, unspecified: M71.9

## 2018-10-01 HISTORY — DX: Unspecified cataract: H26.9

## 2018-10-01 HISTORY — DX: Cystitis, unspecified without hematuria: N30.90

## 2018-10-01 HISTORY — DX: Other specified disorders of bone density and structure, unspecified site: M85.80

## 2018-10-01 HISTORY — DX: Unspecified urinary incontinence: R32

## 2018-10-01 HISTORY — DX: Hypoglycemia, unspecified: E16.2

## 2018-10-01 HISTORY — DX: Presence of dental prosthetic device (complete) (partial): Z97.2

## 2018-10-01 HISTORY — DX: Other amnesia: R41.3

## 2018-10-01 LAB — SURGICAL PCR SCREEN
MRSA, PCR: NEGATIVE
Staphylococcus aureus: NEGATIVE

## 2018-10-01 NOTE — H&P (View-Only) (Signed)
TOTAL HIP ADMISSION H&P  Patient is admitted for right total hip arthroplasty.  Subjective:  Chief Complaint: right hip pain  HPI: [name], 85 y.o. female, has a history of pain and functional disability in the right hip(s) due to arthritis and patient has failed non-surgical conservative treatments for greater than 12 weeks to include NSAID's and/or analgesics, flexibility and strengthening excercises, use of assistive devices, weight reduction as appropriate and activity modification.  Onset of symptoms was gradual starting 4 years ago with rapidlly worsening course since that time.The patient noted no past surgery on the right hip(s).  Patient currently rates pain in the right hip at 10 out of 10 with activity. Patient has night pain, worsening of pain with activity and weight bearing, trendelenberg gait, pain that interfers with activities of daily living, pain with passive range of motion and crepitus. Patient has evidence of subchondral cysts, subchondral sclerosis, periarticular osteophytes and joint space narrowing by imaging studies. This condition presents safety issues increasing the risk of falls.  There is no current active infection.  Patient Active Problem List   Diagnosis Date Noted  . TSH elevation 09/24/2018  . Pre-operative clearance 09/24/2018  . Frequent UTI 11/09/2017  . Leukocytosis 02/06/2016  . Hyperlipidemia with target LDL less than 160 09/24/2014  . Routine general medical examination at a health care facility 09/24/2014  . MACULAR DEGENERATION 08/07/2008  . Glaucoma 08/07/2008  . Insomnia 08/07/2008  . Constipation 07/22/2008  . NEURALGIA, TRIGEMINAL 01/07/2008  . Allergic rhinitis 09/03/2007  . COPD 09/03/2007  . GERD 09/03/2007  . Osteoarthritis 09/03/2007   Past Medical History:  Diagnosis Date  . Adenomatous colon polyp 04/26/95   tubulovillous  . Allergy   . Arthritis   . Bradycardia   . Bursitis   . Cataracts, bilateral   . Constipation    . COPD, moderate ([place])    "THIS WAS TOLD TO ME BACK IN THE DAYS WHEN EVERYONE THAT WAS  A SMOKER WAS TOLD THEY WERE A COPD. IM 89 AND I DONT GET SHORT OF BREATH , I DONT HAVE IT "  . Cystitis   . Degenerative joint disease   . Depression    DENIES   . Gastritis   . GERD (gastroesophageal reflux disease)   . Glaucoma   . Hypoglycemia   . Impaired memory   . Insomnia   . Lack of bladder control   . Macular degeneration   . OA (osteoarthritis)   . Osteopenia   . Renal cyst    right  . Shortness of breath   . Small bowel obstruction ([place])   . Trigeminal neuralgia    DENIES   . Urinary incontinence   . Wears dentures     Past Surgical History:  Procedure Laterality Date  . ABDOMINAL HYSTERECTOMY  1992   nonmalignant reasons  . BACK SURGERY  05/20/08   LUMBAR SPINAL FUSION   . BREAST SURGERY     EXCISION OF CALCIUM DEPOSIT   . CATARACT EXTRACTION Bilateral   . CHOLECYSTECTOMY  1994  . DILATION AND CURETTAGE OF UTERUS    . EYE SURGERY     RIGHT EYE HEMORRHAGE SURGERY   . JOINT REPLACEMENT  2005   LEFT HIP   . PARTIAL COLECTOMY  04/26/95   tubulovillous adenoma  . SHOULDER SURGERY  01/22/2009   left  . TONSILLECTOMY    . TOTAL SHOULDER ARTHROPLASTY Right 07/05/2012   Procedure: RIGHT TOTAL SHOULDER ARTHROPLASTY;  Surgeon: Marin Shutter, MD;  Location: [facility];  Service: Orthopedics;  Laterality: Right;  Right total shoulder arthroplasty    Current Outpatient Medications  Medication Sig Dispense Refill Last Dose  . aspirin EC 81 MG tablet Take 81 mg by mouth daily.     . beta carotene w/minerals (OCUVITE) tablet Take 1 tablet by mouth 2 (two) times daily.   Taking  . bisacodyl (DULCOLAX) 10 MG suppository Place 10 mg rectally daily as needed for moderate constipation.     . bisacodyl (DULCOLAX) 5 MG EC tablet Take 5 mg by mouth daily as needed for moderate constipation.     . dorzolamide-timolol (COSOPT) 22.3-6.8 MG/ML ophthalmic solution Place 1 drop into both eyes 2 (two)  times daily.    Taking  . morphine (MSIR) 15 MG tablet Take 15 mg by mouth every 6 (six) hours as needed for severe pain.     Marland Kitchen omeprazole (PRILOSEC) 20 MG capsule Take 20 mg by mouth daily before breakfast.    Taking  . polyethylene glycol (MIRALAX / GLYCOLAX) 17 g packet Take 17 g by mouth daily as needed for moderate constipation.     . Travoprost, BAK Free, (TRAVATAN Z) 0.004 % SOLN ophthalmic solution Place 1 drop into both eyes at bedtime.    Taking   No current facility-administered medications for this visit.    Allergies  Allergen Reactions  . Codeine Itching and Nausea Only    Small amounts okay  . Pregabalin Other (See Comments)    Confusion and hallucinations  . Promethazine Hcl Other (See Comments)    Muscle cramps  . Sulfonamide Derivatives Nausea Only    Social History   Tobacco Use  . Smoking status: Former Smoker    Packs/day: 1.00    Years: 50.00    Pack years: 50.00    Types: Cigarettes    Last attempt to quit: 06/28/2005    Years since quitting: 13.2  . Smokeless tobacco: Never Used  Substance Use Topics  . Alcohol use: No    Family History  Problem Relation Age of Onset  . Cancer Father        colon  . Heart disease Sister   . Colon polyps Brother   . Diabetes Brother      Review of Systems  Constitutional: Negative.   HENT: Negative.   Eyes: Negative.   Respiratory: Positive for shortness of breath.   Cardiovascular: Negative.   Gastrointestinal: Positive for constipation and nausea.  Genitourinary: Positive for frequency and urgency.  Musculoskeletal: Positive for back pain, joint pain and myalgias.  Skin: Negative.   Neurological: Negative.   Endo/Heme/Allergies: Negative.   Psychiatric/Behavioral: The patient has insomnia.     Objective:  Physical Exam  Vitals reviewed. Constitutional: She is oriented to person, place, and time. She appears well-developed and well-nourished.  HENT:  Head: Normocephalic and atraumatic.  Eyes: Pupils  are equal, round, and reactive to light. Conjunctivae and EOM are normal.  Neck: Normal range of motion. Neck supple.  Cardiovascular: Normal rate, regular rhythm and intact distal pulses.  Respiratory: Effort normal and breath sounds normal.  GI: Soft. She exhibits no distension.  Genitourinary:    Genitourinary Comments: deferred   Musculoskeletal:     Right hip: She exhibits decreased range of motion and bony tenderness.  Neurological: She is alert and oriented to person, place, and time. She has normal reflexes.  Skin: Skin is warm and dry.  Psychiatric: She has a normal mood and affect. Her behavior is normal. Judgment  and thought content normal.    Vital signs in last 24 hours: @VSRANGES @  Labs:   Estimated body mass index is 23.62 kg/m as calculated from the following:   Height as of an earlier encounter on 10/01/18: 5\' 1"  (1.549 m).   Weight as of an earlier encounter on 10/01/18: 56.7 kg.   Imaging Review Plain radiographs demonstrate severe degenerative joint disease of the right hip(s). The bone quality appears to be adequate for age and reported activity level.      Assessment/Plan:  End stage arthritis, right hip(s)  The patient history, physical examination, clinical judgement of the provider and imaging studies are consistent with end stage degenerative joint disease of the right hip(s) and total hip arthroplasty is deemed medically necessary. The treatment options including medical management, injection therapy, arthroscopy and arthroplasty were discussed at length. The risks and benefits of total hip arthroplasty were presented and reviewed. The risks due to aseptic loosening, infection, stiffness, dislocation/subluxation,  thromboembolic complications and other imponderables were discussed.  The patient acknowledged the explanation, agreed to proceed with the plan and consent was signed. Patient is being admitted for inpatient treatment for surgery, pain control,  PT, OT, prophylactic antibiotics, VTE prophylaxis, progressive ambulation and ADL's and discharge planning.The patient is planning to be discharged home with HEP    Patient's anticipated LOS is less than 2 midnights, meeting these requirements: - Younger than 52 - Lives within 1 hour of care - Has a competent adult at home to recover with post-op recover - NO history of  - Chronic pain requiring opiods  - Diabetes  - Coronary Artery Disease  - Heart failure  - Heart attack  - Stroke  - DVT/VTE  - Cardiac arrhythmia  - Respiratory Failure/COPD  - Renal failure  - Anemia  - Advanced Liver disease

## 2018-10-01 NOTE — H&P (Signed)
TOTAL HIP ADMISSION H&P  Patient is admitted for right total hip arthroplasty.  Subjective:  Chief Complaint: right hip pain  HPI: [name], 85 y.o. female, has a history of pain and functional disability in the right hip(s) due to arthritis and patient has failed non-surgical conservative treatments for greater than 12 weeks to include NSAID's and/or analgesics, flexibility and strengthening excercises, use of assistive devices, weight reduction as appropriate and activity modification.  Onset of symptoms was gradual starting 4 years ago with rapidlly worsening course since that time.The patient noted no past surgery on the right hip(s).  Patient currently rates pain in the right hip at 10 out of 10 with activity. Patient has night pain, worsening of pain with activity and weight bearing, trendelenberg gait, pain that interfers with activities of daily living, pain with passive range of motion and crepitus. Patient has evidence of subchondral cysts, subchondral sclerosis, periarticular osteophytes and joint space narrowing by imaging studies. This condition presents safety issues increasing the risk of falls.  There is no current active infection.  Patient Active Problem List   Diagnosis Date Noted  . TSH elevation 09/24/2018  . Pre-operative clearance 09/24/2018  . Frequent UTI 11/09/2017  . Leukocytosis 02/06/2016  . Hyperlipidemia with target LDL less than 160 09/24/2014  . Routine general medical examination at a health care facility 09/24/2014  . MACULAR DEGENERATION 08/07/2008  . Glaucoma 08/07/2008  . Insomnia 08/07/2008  . Constipation 07/22/2008  . NEURALGIA, TRIGEMINAL 01/07/2008  . Allergic rhinitis 09/03/2007  . COPD 09/03/2007  . GERD 09/03/2007  . Osteoarthritis 09/03/2007   Past Medical History:  Diagnosis Date  . Adenomatous colon polyp 04/26/95   tubulovillous  . Allergy   . Arthritis   . Bradycardia   . Bursitis   . Cataracts, bilateral   . Constipation    . COPD, moderate ([place])    "THIS WAS TOLD TO ME BACK IN THE DAYS WHEN EVERYONE THAT WAS  A SMOKER WAS TOLD THEY WERE A COPD. IM 89 AND I DONT GET SHORT OF BREATH , I DONT HAVE IT "  . Cystitis   . Degenerative joint disease   . Depression    DENIES   . Gastritis   . GERD (gastroesophageal reflux disease)   . Glaucoma   . Hypoglycemia   . Impaired memory   . Insomnia   . Lack of bladder control   . Macular degeneration   . OA (osteoarthritis)   . Osteopenia   . Renal cyst    right  . Shortness of breath   . Small bowel obstruction ([place])   . Trigeminal neuralgia    DENIES   . Urinary incontinence   . Wears dentures     Past Surgical History:  Procedure Laterality Date  . ABDOMINAL HYSTERECTOMY  1992   nonmalignant reasons  . BACK SURGERY  05/20/08   LUMBAR SPINAL FUSION   . BREAST SURGERY     EXCISION OF CALCIUM DEPOSIT   . CATARACT EXTRACTION Bilateral   . CHOLECYSTECTOMY  1994  . DILATION AND CURETTAGE OF UTERUS    . EYE SURGERY     RIGHT EYE HEMORRHAGE SURGERY   . JOINT REPLACEMENT  2005   LEFT HIP   . PARTIAL COLECTOMY  04/26/95   tubulovillous adenoma  . SHOULDER SURGERY  01/22/2009   left  . TONSILLECTOMY    . TOTAL SHOULDER ARTHROPLASTY Right 07/05/2012   Procedure: RIGHT TOTAL SHOULDER ARTHROPLASTY;  Surgeon: Marin Shutter, MD;  Location: [place];  Service: Orthopedics;  Laterality: Right;  Right total shoulder arthroplasty    Current Outpatient Medications  Medication Sig Dispense Refill Last Dose  . aspirin EC 81 MG tablet Take 81 mg by mouth daily.     . beta carotene w/minerals (OCUVITE) tablet Take 1 tablet by mouth 2 (two) times daily.   Taking  . bisacodyl (DULCOLAX) 10 MG suppository Place 10 mg rectally daily as needed for moderate constipation.     . bisacodyl (DULCOLAX) 5 MG EC tablet Take 5 mg by mouth daily as needed for moderate constipation.     . dorzolamide-timolol (COSOPT) 22.3-6.8 MG/ML ophthalmic solution Place 1 drop into both eyes 2 (two)  times daily.    Taking  . morphine (MSIR) 15 MG tablet Take 15 mg by mouth every 6 (six) hours as needed for severe pain.     Marland Kitchen omeprazole (PRILOSEC) 20 MG capsule Take 20 mg by mouth daily before breakfast.    Taking  . polyethylene glycol (MIRALAX / GLYCOLAX) 17 g packet Take 17 g by mouth daily as needed for moderate constipation.     . Travoprost, BAK Free, (TRAVATAN Z) 0.004 % SOLN ophthalmic solution Place 1 drop into both eyes at bedtime.    Taking   No current facility-administered medications for this visit.    Allergies  Allergen Reactions  . Codeine Itching and Nausea Only    Small amounts okay  . Pregabalin Other (See Comments)    Confusion and hallucinations  . Promethazine Hcl Other (See Comments)    Muscle cramps  . Sulfonamide Derivatives Nausea Only    Social History   Tobacco Use  . Smoking status: Former Smoker    Packs/day: 1.00    Years: 50.00    Pack years: 50.00    Types: Cigarettes    Last attempt to quit: 06/28/2005    Years since quitting: 13.2  . Smokeless tobacco: Never Used  Substance Use Topics  . Alcohol use: No    Family History  Problem Relation Age of Onset  . Cancer Father        colon  . Heart disease Sister   . Colon polyps Brother   . Diabetes Brother      Review of Systems  Constitutional: Negative.   HENT: Negative.   Eyes: Negative.   Respiratory: Positive for shortness of breath.   Cardiovascular: Negative.   Gastrointestinal: Positive for constipation and nausea.  Genitourinary: Positive for frequency and urgency.  Musculoskeletal: Positive for back pain, joint pain and myalgias.  Skin: Negative.   Neurological: Negative.   Endo/Heme/Allergies: Negative.   Psychiatric/Behavioral: The patient has insomnia.     Objective:  Physical Exam  Vitals reviewed. Constitutional: She is oriented to person, place, and time. She appears well-developed and well-nourished.  HENT:  Head: Normocephalic and atraumatic.  Eyes: Pupils  are equal, round, and reactive to light. Conjunctivae and EOM are normal.  Neck: Normal range of motion. Neck supple.  Cardiovascular: Normal rate, regular rhythm and intact distal pulses.  Respiratory: Effort normal and breath sounds normal.  GI: Soft. She exhibits no distension.  Genitourinary:    Genitourinary Comments: deferred   Musculoskeletal:     Right hip: She exhibits decreased range of motion and bony tenderness.  Neurological: She is alert and oriented to person, place, and time. She has normal reflexes.  Skin: Skin is warm and dry.  Psychiatric: She has a normal mood and affect. Her behavior is normal. Judgment  and thought content normal.    Vital signs in last 24 hours: @VSRANGES @  Labs:   Estimated body mass index is 23.62 kg/m as calculated from the following:   Height as of an earlier encounter on 10/01/18: 5\' 1"  (1.549 m).   Weight as of an earlier encounter on 10/01/18: 56.7 kg.   Imaging Review Plain radiographs demonstrate severe degenerative joint disease of the right hip(s). The bone quality appears to be adequate for age and reported activity level.      Assessment/Plan:  End stage arthritis, right hip(s)  The patient history, physical examination, clinical judgement of the provider and imaging studies are consistent with end stage degenerative joint disease of the right hip(s) and total hip arthroplasty is deemed medically necessary. The treatment options including medical management, injection therapy, arthroscopy and arthroplasty were discussed at length. The risks and benefits of total hip arthroplasty were presented and reviewed. The risks due to aseptic loosening, infection, stiffness, dislocation/subluxation,  thromboembolic complications and other imponderables were discussed.  The patient acknowledged the explanation, agreed to proceed with the plan and consent was signed. Patient is being admitted for inpatient treatment for surgery, pain control,  PT, OT, prophylactic antibiotics, VTE prophylaxis, progressive ambulation and ADL's and discharge planning.The patient is planning to be discharged home with HEP    Patient's anticipated LOS is less than 2 midnights, meeting these requirements: - Younger than 28 - Lives within 1 hour of care - Has a competent adult at home to recover with post-op recover - NO history of  - Chronic pain requiring opiods  - Diabetes  - Coronary Artery Disease  - Heart failure  - Heart attack  - Stroke  - DVT/VTE  - Cardiac arrhythmia  - Respiratory Failure/COPD  - Renal failure  - Anemia  - Advanced Liver disease

## 2018-10-02 LAB — NOVEL CORONAVIRUS, NAA (HOSP ORDER, SEND-OUT TO REF LAB; TAT 18-24 HRS): SARS-CoV-2, NAA: NOT DETECTED

## 2018-10-02 NOTE — Progress Notes (Signed)
Anesthesia Chart Review   Case:  [id number] Date/Time:  10/04/18 1000   Procedure:  TOTAL HIP ARTHROPLASTY ANTERIOR APPROACH (Right )   Anesthesia type:  Spinal   Pre-op diagnosis:  Right hip osteoarthritis   Location:  [facility] ROOM 07 / [facility]   Surgeon:  Rod Can, MD      DISCUSSION: 85 yo former smoker (50 pack years, quit 06/28/05) with h/o COPD, depression, GERD, constipation, right hip OA scheduled for above procedure 10/04/18 with Dr. Rod Can.   Pt seen by PCP, Dr. Scarlette Calico, 09/24/18 for preoperative evaluation, stable at this visit, pt cleared.  VS: BP (!) 158/50   Pulse (!) 57   Temp (!) 36.3 C (Oral)   Resp 16   Ht 5\' 1"  (1.549 m)   Wt 56.7 kg   SpO2 100%   BMI 23.62 kg/m   PROVIDERS: Janith Lima, MD  Is PCP   LABS: Labs reviewed: Acceptable for surgery. (all labs ordered are listed, but only abnormal results are displayed)  Labs Reviewed  SURGICAL PCR SCREEN     IMAGES:   EKG: 09/24/2018 Rate 54 bpm Sinus bradycardia   CV:  Past Medical History:  Diagnosis Date  . Adenomatous colon polyp 04/26/95   tubulovillous  . Allergy   . Arthritis   . Bradycardia   . Bursitis   . Cataracts, bilateral   . Constipation   . COPD, moderate ([place])    "THIS WAS TOLD TO ME BACK IN THE DAYS WHEN EVERYONE THAT WAS  A SMOKER WAS TOLD THEY WERE A COPD. IM 90 AND I DONT GET SHORT OF BREATH , I DONT HAVE IT "  . Cystitis   . Degenerative joint disease   . Depression    DENIES   . Gastritis   . GERD (gastroesophageal reflux disease)   . Glaucoma   . Hypoglycemia   . Impaired memory   . Insomnia   . Lack of bladder control   . Macular degeneration   . OA (osteoarthritis)   . Osteopenia   . Renal cyst    right  . Shortness of breath   . Small bowel obstruction ([place])   . Trigeminal neuralgia    DENIES   . Urinary incontinence   . Wears dentures     Past Surgical History:  Procedure Laterality Date  . ABDOMINAL HYSTERECTOMY  1992   nonmalignant  reasons  . BACK SURGERY  05/20/08   LUMBAR SPINAL FUSION   . BREAST SURGERY     EXCISION OF CALCIUM DEPOSIT   . CATARACT EXTRACTION Bilateral   . CHOLECYSTECTOMY  1994  . DILATION AND CURETTAGE OF UTERUS    . EYE SURGERY     RIGHT EYE HEMORRHAGE SURGERY   . JOINT REPLACEMENT  2005   LEFT HIP   . PARTIAL COLECTOMY  04/26/95   tubulovillous adenoma  . SHOULDER SURGERY  01/22/2009   left  . TONSILLECTOMY    . TOTAL SHOULDER ARTHROPLASTY Right 07/05/2012   Procedure: RIGHT TOTAL SHOULDER ARTHROPLASTY;  Surgeon: Marin Shutter, MD;  Location: [facility];  Service: Orthopedics;  Laterality: Right;  Right total shoulder arthroplasty    MEDICATIONS: . aspirin EC 81 MG tablet  . beta carotene w/minerals (OCUVITE) tablet  . bisacodyl (DULCOLAX) 10 MG suppository  . bisacodyl (DULCOLAX) 5 MG EC tablet  . dorzolamide-timolol (COSOPT) 22.3-6.8 MG/ML ophthalmic solution  . morphine (MSIR) 15 MG tablet  . omeprazole (PRILOSEC) 20 MG capsule  . polyethylene  glycol (MIRALAX / GLYCOLAX) 17 g packet  . Travoprost, BAK Free, (TRAVATAN Z) 0.004 % SOLN ophthalmic solution   No current facility-administered medications for this encounter.     Maia Plan [facility] Pre-Surgical Testing [phone] 10/02/18 2:42 PM

## 2018-10-02 NOTE — Anesthesia Preprocedure Evaluation (Addendum)
Anesthesia Evaluation  Patient identified by MRN, date of birth, ID band Patient awake    Reviewed: Allergy & Precautions, NPO status , Patient's Chart, lab work & pertinent test results  Airway Mallampati: II  TM Distance: >3 FB Neck ROM: Full    Dental  (+) Poor Dentition, Missing, Dental Advisory Given   Pulmonary COPD, former smoker,  10/01/2018 Novel coronavirus NEG   breath sounds clear to auscultation       Cardiovascular (-) hypertension(-) anginanegative cardio ROS   Rhythm:Regular Rate:Normal     Neuro/Psych Depression Trigeminal neuralgia chronic back pain: narcotics    GI/Hepatic GERD  Medicated and Controlled,(+)     substance abuse  , H/o SBO   Endo/Other  negative endocrine ROS  Renal/GU negative Renal ROS     Musculoskeletal  (+) Arthritis , narcotic dependent  Abdominal   Peds  Hematology negative hematology ROS (+)   Anesthesia Other Findings   Reproductive/Obstetrics                           Anesthesia Physical Anesthesia Plan  ASA: III  Anesthesia Plan: Spinal   Post-op Pain Management:    Induction:   PONV Risk Score and Plan: 2 and Ondansetron, Dexamethasone and Treatment may vary due to age or medical condition  Airway Management Planned: Natural Airway and Simple Face Mask  Additional Equipment:   Intra-op Plan:   Post-operative Plan:   Informed Consent: I have reviewed the patients History and Physical, chart, labs and discussed the procedure including the risks, benefits and alternatives for the proposed anesthesia with the patient or authorized representative who has indicated his/her understanding and acceptance.     Dental advisory given  Plan Discussed with: CRNA and Surgeon  Anesthesia Plan Comments: (See PAT note 10/01/2018, Konrad Felix, PA-C)       Anesthesia Quick Evaluation

## 2018-10-03 ENCOUNTER — Inpatient Hospital Stay (HOSPITAL_COMMUNITY): Admission: RE | Admit: 2018-10-03 | Payer: Medicare Other | Source: Ambulatory Visit

## 2018-10-03 ENCOUNTER — Other Ambulatory Visit: Payer: Self-pay

## 2018-10-03 NOTE — Progress Notes (Signed)
PATIENT MADE OF SURGERY TIME CHANGE. CHECK IN TO ADMITTING AT 0800. NPO AFTER MN. SIP OF WATER WITH AM MEDS OKAY.

## 2018-10-03 NOTE — Progress Notes (Signed)
SPOKE W/  Left voicemail for Zareya Tuckett to return call     SCREENING SYMPTOMS OF COVID 19:   COUGH--  RUNNY NOSE---   SORE THROAT---  NASAL CONGESTION----  SNEEZING----  SHORTNESS OF BREATH---  DIFFICULTY BREATHING---  TEMP >100.0 -----  UNEXPLAINED BODY ACHES------  CHILLS --------   HEADACHES ---------  LOSS OF SMELL/ TASTE --------    HAVE YOU OR ANY FAMILY MEMBER TRAVELLED PAST 14 DAYS OUT OF THE   COUNTY--- STATE---- COUNTRY----  HAVE YOU OR ANY FAMILY MEMBER BEEN EXPOSED TO ANYONE WITH COVID 19?

## 2018-10-03 NOTE — Progress Notes (Signed)
SPOKE W/  Charlynne Pander     SCREENING SYMPTOMS OF COVID 19:   COUGH--no  RUNNY NOSE--- no  SORE THROAT---no  NASAL CONGESTION----no  SNEEZING----no  SHORTNESS OF BREATH---no  DIFFICULTY BREATHING---no  TEMP >100.0 -----no  UNEXPLAINED BODY ACHES------no  CHILLS -------- no  HEADACHES ---------no  LOSS OF SMELL/ TASTE --------no    HAVE YOU OR ANY FAMILY MEMBER TRAVELLED PAST 14 DAYS OUT OF THE   COUNTY---no STATE----no COUNTRY----no  HAVE YOU OR ANY FAMILY MEMBER BEEN EXPOSED TO ANYONE WITH COVID 19? no

## 2018-10-04 ENCOUNTER — Inpatient Hospital Stay (HOSPITAL_COMMUNITY): Payer: Medicare Other

## 2018-10-04 ENCOUNTER — Inpatient Hospital Stay (HOSPITAL_COMMUNITY)
Admission: RE | Admit: 2018-10-04 | Discharge: 2018-10-05 | DRG: 470 | Disposition: A | Discharge status: Home / Self Care | Payer: Medicare Other | Attending: Orthopedic Surgery | Admitting: Orthopedic Surgery

## 2018-10-04 ENCOUNTER — Other Ambulatory Visit: Payer: Self-pay

## 2018-10-04 ENCOUNTER — Inpatient Hospital Stay (HOSPITAL_COMMUNITY): Payer: Medicare Other | Admitting: Physician Assistant

## 2018-10-04 ENCOUNTER — Encounter (HOSPITAL_COMMUNITY)
Admission: RE | Disposition: A | Discharge status: Home / Self Care | Payer: Self-pay | Source: Home / Self Care | Attending: Orthopedic Surgery

## 2018-10-04 ENCOUNTER — Inpatient Hospital Stay (HOSPITAL_COMMUNITY): Payer: Medicare Other | Admitting: Anesthesiology

## 2018-10-04 ENCOUNTER — Encounter (HOSPITAL_COMMUNITY): Payer: Self-pay | Admitting: Anesthesiology

## 2018-10-04 DIAGNOSIS — Z96611 Presence of right artificial shoulder joint: Secondary | ICD-10-CM | POA: Diagnosis present

## 2018-10-04 DIAGNOSIS — Z79899 Other long term (current) drug therapy: Secondary | ICD-10-CM | POA: Diagnosis not present

## 2018-10-04 DIAGNOSIS — Z1159 Encounter for screening for other viral diseases: Secondary | ICD-10-CM | POA: Diagnosis not present

## 2018-10-04 DIAGNOSIS — J449 Chronic obstructive pulmonary disease, unspecified: Secondary | ICD-10-CM | POA: Diagnosis present

## 2018-10-04 DIAGNOSIS — Z09 Encounter for follow-up examination after completed treatment for conditions other than malignant neoplasm: Secondary | ICD-10-CM

## 2018-10-04 DIAGNOSIS — K5900 Constipation, unspecified: Secondary | ICD-10-CM | POA: Diagnosis present

## 2018-10-04 DIAGNOSIS — Z471 Aftercare following joint replacement surgery: Secondary | ICD-10-CM | POA: Diagnosis not present

## 2018-10-04 DIAGNOSIS — M25551 Pain in right hip: Secondary | ICD-10-CM | POA: Diagnosis not present

## 2018-10-04 DIAGNOSIS — Z7982 Long term (current) use of aspirin: Secondary | ICD-10-CM | POA: Diagnosis not present

## 2018-10-04 DIAGNOSIS — Z87891 Personal history of nicotine dependence: Secondary | ICD-10-CM | POA: Diagnosis not present

## 2018-10-04 DIAGNOSIS — K219 Gastro-esophageal reflux disease without esophagitis: Secondary | ICD-10-CM | POA: Diagnosis present

## 2018-10-04 DIAGNOSIS — M1611 Unilateral primary osteoarthritis, right hip: Secondary | ICD-10-CM | POA: Diagnosis present

## 2018-10-04 DIAGNOSIS — Z981 Arthrodesis status: Secondary | ICD-10-CM

## 2018-10-04 DIAGNOSIS — E785 Hyperlipidemia, unspecified: Secondary | ICD-10-CM | POA: Diagnosis present

## 2018-10-04 DIAGNOSIS — Z90710 Acquired absence of both cervix and uterus: Secondary | ICD-10-CM

## 2018-10-04 DIAGNOSIS — Z9049 Acquired absence of other specified parts of digestive tract: Secondary | ICD-10-CM

## 2018-10-04 DIAGNOSIS — Z96641 Presence of right artificial hip joint: Secondary | ICD-10-CM | POA: Diagnosis not present

## 2018-10-04 DIAGNOSIS — G500 Trigeminal neuralgia: Secondary | ICD-10-CM | POA: Diagnosis not present

## 2018-10-04 DIAGNOSIS — Z419 Encounter for procedure for purposes other than remedying health state, unspecified: Secondary | ICD-10-CM

## 2018-10-04 DIAGNOSIS — F329 Major depressive disorder, single episode, unspecified: Secondary | ICD-10-CM | POA: Diagnosis not present

## 2018-10-04 HISTORY — PX: TOTAL HIP ARTHROPLASTY: SHX124

## 2018-10-04 LAB — TYPE AND SCREEN
ABO/RH(D): A POS
Antibody Screen: NEGATIVE

## 2018-10-04 LAB — ABO/RH: ABO/RH(D): A POS

## 2018-10-04 SURGERY — ARTHROPLASTY, HIP, TOTAL, ANTERIOR APPROACH
Anesthesia: Spinal | Site: Hip | Laterality: Right

## 2018-10-04 MED ORDER — ACETAMINOPHEN 10 MG/ML IV SOLN
1000.0000 mg | INTRAVENOUS | Status: AC
  Administered 2018-10-04: 1000 mg via INTRAVENOUS
  Filled 2018-10-04: qty 100

## 2018-10-04 MED ORDER — SODIUM CHLORIDE 0.9% IV SOLUTION: Freq: Once | INTRAVENOUS | Status: DC

## 2018-10-04 MED ORDER — METOCLOPRAMIDE HCL 5 MG/ML IJ SOLN: 5.0000 mg | Freq: Three times a day (TID) | INTRAMUSCULAR | Status: DC | PRN

## 2018-10-04 MED ORDER — KETOROLAC TROMETHAMINE 15 MG/ML IJ SOLN
7.5000 mg | Freq: Four times a day (QID) | INTRAMUSCULAR | Status: AC
  Administered 2018-10-04 – 2018-10-05 (×4): 7.5 mg via INTRAVENOUS
  Filled 2018-10-04 (×4): qty 1

## 2018-10-04 MED ORDER — DORZOLAMIDE HCL-TIMOLOL MAL 2-0.5 % OP SOLN
1.0000 [drp] | Freq: Two times a day (BID) | OPHTHALMIC | Status: DC
  Administered 2018-10-04 – 2018-10-05 (×2): 1 [drp] via OPHTHALMIC
  Filled 2018-10-04: qty 10

## 2018-10-04 MED ORDER — SODIUM CHLORIDE (PF) 0.9 % IJ SOLN
INTRAMUSCULAR | Status: AC
  Filled 2018-10-04: qty 50

## 2018-10-04 MED ORDER — HYDROMORPHONE HCL 2 MG PO TABS
2.0000 mg | ORAL_TABLET | ORAL | Status: DC | PRN
  Administered 2018-10-04 – 2018-10-05 (×4): 2 mg via ORAL
  Filled 2018-10-04 (×4): qty 1

## 2018-10-04 MED ORDER — KETOROLAC TROMETHAMINE 30 MG/ML IJ SOLN
INTRAMUSCULAR | Status: AC
  Filled 2018-10-04: qty 1

## 2018-10-04 MED ORDER — BUPIVACAINE-EPINEPHRINE 0.25% -1:200000 IJ SOLN
INTRAMUSCULAR | Status: DC | PRN
  Administered 2018-10-04: 30 mL

## 2018-10-04 MED ORDER — ALUM & MAG HYDROXIDE-SIMETH 200-200-20 MG/5ML PO SUSP: 30.0000 mL | ORAL | Status: DC | PRN

## 2018-10-04 MED ORDER — PROPOFOL 500 MG/50ML IV EMUL
INTRAVENOUS | Status: DC | PRN
  Administered 2018-10-04: 75 ug/kg/min via INTRAVENOUS

## 2018-10-04 MED ORDER — ONDANSETRON HCL 4 MG/2ML IJ SOLN
4.0000 mg | Freq: Four times a day (QID) | INTRAMUSCULAR | Status: DC | PRN
  Administered 2018-10-05: 4 mg via INTRAVENOUS
  Filled 2018-10-04: qty 2

## 2018-10-04 MED ORDER — HYDROMORPHONE HCL 1 MG/ML IJ SOLN
0.5000 mg | INTRAMUSCULAR | Status: DC | PRN
  Administered 2018-10-04 – 2018-10-05 (×2): 1 mg via INTRAVENOUS
  Filled 2018-10-04 (×2): qty 1

## 2018-10-04 MED ORDER — DEXAMETHASONE SODIUM PHOSPHATE 10 MG/ML IJ SOLN
INTRAMUSCULAR | Status: DC | PRN
  Administered 2018-10-04: 6 mg via INTRAVENOUS

## 2018-10-04 MED ORDER — ONDANSETRON HCL 4 MG PO TABS: 4.0000 mg | ORAL_TABLET | Freq: Four times a day (QID) | ORAL | Status: DC | PRN

## 2018-10-04 MED ORDER — METHOCARBAMOL 500 MG PO TABS
500.0000 mg | ORAL_TABLET | Freq: Four times a day (QID) | ORAL | Status: DC | PRN
  Administered 2018-10-04: 500 mg via ORAL
  Filled 2018-10-04: qty 1

## 2018-10-04 MED ORDER — ACETAMINOPHEN 325 MG PO TABS: 325.0000 mg | ORAL_TABLET | Freq: Four times a day (QID) | ORAL | Status: DC | PRN

## 2018-10-04 MED ORDER — SENNA 8.6 MG PO TABS
1.0000 | ORAL_TABLET | Freq: Two times a day (BID) | ORAL | Status: DC
  Administered 2018-10-04 – 2018-10-05 (×2): 8.6 mg via ORAL
  Filled 2018-10-04 (×2): qty 1

## 2018-10-04 MED ORDER — TRANEXAMIC ACID-NACL 1000-0.7 MG/100ML-% IV SOLN
1000.0000 mg | INTRAVENOUS | Status: AC
  Administered 2018-10-04: 1000 mg via INTRAVENOUS
  Filled 2018-10-04: qty 100

## 2018-10-04 MED ORDER — DEXAMETHASONE SODIUM PHOSPHATE 10 MG/ML IJ SOLN
10.0000 mg | Freq: Once | INTRAMUSCULAR | Status: AC
  Administered 2018-10-05: 09:00:00 10 mg via INTRAVENOUS
  Filled 2018-10-04: qty 1

## 2018-10-04 MED ORDER — ASPIRIN 81 MG PO CHEW
81.0000 mg | CHEWABLE_TABLET | Freq: Two times a day (BID) | ORAL | Status: DC
  Administered 2018-10-04 – 2018-10-05 (×2): 81 mg via ORAL
  Filled 2018-10-04 (×2): qty 1

## 2018-10-04 MED ORDER — ONDANSETRON HCL 4 MG/2ML IJ SOLN
INTRAMUSCULAR | Status: DC | PRN
  Administered 2018-10-04: 4 mg via INTRAVENOUS

## 2018-10-04 MED ORDER — ISOPROPYL ALCOHOL 70 % SOLN
Status: DC | PRN
  Administered 2018-10-04: 1 via TOPICAL

## 2018-10-04 MED ORDER — CHLORHEXIDINE GLUCONATE 4 % EX LIQD: 60.0000 mL | Freq: Once | CUTANEOUS | Status: DC

## 2018-10-04 MED ORDER — LACTATED RINGERS IV SOLN
INTRAVENOUS | Status: DC
  Administered 2018-10-04 (×2): via INTRAVENOUS

## 2018-10-04 MED ORDER — POLYETHYLENE GLYCOL 3350 17 G PO PACK: 17.0000 g | PACK | Freq: Every day | ORAL | Status: DC | PRN

## 2018-10-04 MED ORDER — FENTANYL CITRATE (PF) 100 MCG/2ML IJ SOLN
INTRAMUSCULAR | Status: AC
  Filled 2018-10-04: qty 2

## 2018-10-04 MED ORDER — POVIDONE-IODINE 10 % EX SWAB
2.0000 "application " | Freq: Once | CUTANEOUS | Status: AC
  Administered 2018-10-04: 2 via TOPICAL

## 2018-10-04 MED ORDER — BISACODYL 5 MG PO TBEC: 5.0000 mg | DELAYED_RELEASE_TABLET | Freq: Every day | ORAL | Status: DC | PRN

## 2018-10-04 MED ORDER — EPHEDRINE 5 MG/ML INJ
INTRAVENOUS | Status: AC
  Filled 2018-10-04: qty 10

## 2018-10-04 MED ORDER — PHENYLEPHRINE HCL (PRESSORS) 10 MG/ML IV SOLN
INTRAVENOUS | Status: AC
  Filled 2018-10-04: qty 7

## 2018-10-04 MED ORDER — BUPIVACAINE-EPINEPHRINE (PF) 0.25% -1:200000 IJ SOLN
INTRAMUSCULAR | Status: AC
  Filled 2018-10-04: qty 30

## 2018-10-04 MED ORDER — PROPOFOL 10 MG/ML IV BOLUS
INTRAVENOUS | Status: AC
  Filled 2018-10-04: qty 60

## 2018-10-04 MED ORDER — FENTANYL CITRATE (PF) 100 MCG/2ML IJ SOLN
INTRAMUSCULAR | Status: DC | PRN
  Administered 2018-10-04 (×2): 25 ug via INTRAVENOUS
  Administered 2018-10-04: 50 ug via INTRAVENOUS

## 2018-10-04 MED ORDER — SODIUM CHLORIDE 0.9 % IV SOLN
INTRAVENOUS | Status: DC
  Administered 2018-10-04 (×2): via INTRAVENOUS

## 2018-10-04 MED ORDER — MENTHOL 3 MG MT LOZG: 1.0000 | LOZENGE | OROMUCOSAL | Status: DC | PRN

## 2018-10-04 MED ORDER — FENTANYL CITRATE (PF) 100 MCG/2ML IJ SOLN: 25.0000 ug | INTRAMUSCULAR | Status: DC | PRN

## 2018-10-04 MED ORDER — PROPOFOL 10 MG/ML IV BOLUS
INTRAVENOUS | Status: DC | PRN
  Administered 2018-10-04: 10 mg via INTRAVENOUS

## 2018-10-04 MED ORDER — BUPIVACAINE IN DEXTROSE 0.75-8.25 % IT SOLN
INTRATHECAL | Status: DC | PRN
  Administered 2018-10-04: 12 mg via INTRATHECAL

## 2018-10-04 MED ORDER — PHENOL 1.4 % MT LIQD
1.0000 | OROMUCOSAL | Status: DC | PRN
  Filled 2018-10-04: qty 177

## 2018-10-04 MED ORDER — CEFAZOLIN SODIUM-DEXTROSE 2-4 GM/100ML-% IV SOLN
2.0000 g | INTRAVENOUS | Status: AC
  Administered 2018-10-04: 2 g via INTRAVENOUS
  Filled 2018-10-04: qty 100

## 2018-10-04 MED ORDER — BISACODYL 10 MG RE SUPP: 10.0000 mg | Freq: Every day | RECTAL | Status: DC | PRN

## 2018-10-04 MED ORDER — DOCUSATE SODIUM 100 MG PO CAPS
100.0000 mg | ORAL_CAPSULE | Freq: Two times a day (BID) | ORAL | Status: DC
  Administered 2018-10-04 – 2018-10-05 (×2): 100 mg via ORAL
  Filled 2018-10-04 (×2): qty 1

## 2018-10-04 MED ORDER — POVIDONE-IODINE 10 % EX SWAB: 2.0000 "application " | Freq: Once | CUTANEOUS | Status: DC

## 2018-10-04 MED ORDER — DIPHENHYDRAMINE HCL 12.5 MG/5ML PO ELIX: 12.5000 mg | ORAL_SOLUTION | ORAL | Status: DC | PRN

## 2018-10-04 MED ORDER — LATANOPROST 0.005 % OP SOLN
1.0000 [drp] | Freq: Every day | OPHTHALMIC | Status: DC
  Administered 2018-10-04: 1 [drp] via OPHTHALMIC
  Filled 2018-10-04: qty 2.5

## 2018-10-04 MED ORDER — METOCLOPRAMIDE HCL 5 MG PO TABS: 5.0000 mg | ORAL_TABLET | Freq: Three times a day (TID) | ORAL | Status: DC | PRN

## 2018-10-04 MED ORDER — SODIUM CHLORIDE 0.9 % IV SOLN
INTRAVENOUS | Status: DC | PRN
  Administered 2018-10-04: 25 ug/min via INTRAVENOUS

## 2018-10-04 MED ORDER — PANTOPRAZOLE SODIUM 40 MG PO TBEC
40.0000 mg | DELAYED_RELEASE_TABLET | Freq: Every day | ORAL | Status: DC
  Administered 2018-10-05: 09:00:00 40 mg via ORAL
  Filled 2018-10-04: qty 1

## 2018-10-04 MED ORDER — METHOCARBAMOL 500 MG IVPB - SIMPLE MED
500.0000 mg | Freq: Four times a day (QID) | INTRAVENOUS | Status: DC | PRN
  Filled 2018-10-04: qty 50

## 2018-10-04 MED ORDER — SODIUM CHLORIDE 0.9 % IV SOLN: INTRAVENOUS | Status: DC

## 2018-10-04 MED ORDER — SODIUM CHLORIDE 0.9 % IR SOLN
Status: DC | PRN
  Administered 2018-10-04: 1000 mL

## 2018-10-04 MED ORDER — CEFAZOLIN SODIUM-DEXTROSE 1-4 GM/50ML-% IV SOLN
1.0000 g | Freq: Four times a day (QID) | INTRAVENOUS | Status: AC
  Administered 2018-10-04 (×2): 1 g via INTRAVENOUS
  Filled 2018-10-04 (×2): qty 50

## 2018-10-04 MED ORDER — EPHEDRINE SULFATE-NACL 50-0.9 MG/10ML-% IV SOSY
PREFILLED_SYRINGE | INTRAVENOUS | Status: DC | PRN
  Administered 2018-10-04 (×3): 5 mg via INTRAVENOUS

## 2018-10-04 SURGICAL SUPPLY — 64 items
ADH SKN CLS APL DERMABOND .7 (GAUZE/BANDAGES/DRESSINGS) ×2
APL PRP STRL LF DISP 70% ISPRP (MISCELLANEOUS) ×1
BAG DECANTER FOR FLEXI CONT (MISCELLANEOUS) IMPLANT
BAG SPEC THK2 15X12 ZIP CLS (MISCELLANEOUS)
BAG ZIPLOCK 12X15 (MISCELLANEOUS) IMPLANT
BLADE SURG SZ10 CARB STEEL (BLADE) ×6 IMPLANT
CHLORAPREP W/TINT 26 (MISCELLANEOUS) ×3 IMPLANT
CLOTH BEACON ORANGE TIMEOUT ST (SAFETY) ×3 IMPLANT
COVER PERINEAL POST (MISCELLANEOUS) ×3 IMPLANT
COVER SURGICAL LIGHT HANDLE (MISCELLANEOUS) ×3 IMPLANT
COVER WAND RF STERILE (DRAPES) IMPLANT
CUP SECTOR GRIPTON 50MM (Cup) ×2 IMPLANT
DECANTER SPIKE VIAL GLASS SM (MISCELLANEOUS) ×3 IMPLANT
DERMABOND ADVANCED (GAUZE/BANDAGES/DRESSINGS) ×4
DERMABOND ADVANCED .7 DNX12 (GAUZE/BANDAGES/DRESSINGS) ×2 IMPLANT
DRAPE IMP U-DRAPE 54X76 (DRAPES) ×3 IMPLANT
DRAPE SHEET LG 3/4 BI-LAMINATE (DRAPES) ×9 IMPLANT
DRAPE STERI IOBAN 125X83 (DRAPES) ×3 IMPLANT
DRAPE U-SHAPE 47X51 STRL (DRAPES) ×6 IMPLANT
DRESSING AQUACEL AG SP 3.5X10 (GAUZE/BANDAGES/DRESSINGS) IMPLANT
DRSG AQUACEL AG ADV 3.5X10 (GAUZE/BANDAGES/DRESSINGS) ×3 IMPLANT
DRSG AQUACEL AG SP 3.5X10 (GAUZE/BANDAGES/DRESSINGS) ×3
ELECT BLADE TIP CTD 4 INCH (ELECTRODE) ×3 IMPLANT
ELECT PENCIL ROCKER SW 15FT (MISCELLANEOUS) ×3 IMPLANT
ELECT REM PT RETURN 15FT ADLT (MISCELLANEOUS) ×3 IMPLANT
EVACUATOR SILICONE 100CC (DRAIN) ×2 IMPLANT
GAUZE SPONGE 4X4 12PLY STRL (GAUZE/BANDAGES/DRESSINGS) ×3 IMPLANT
GLOVE BIO SURGEON STRL SZ8.5 (GLOVE) ×6 IMPLANT
GLOVE BIOGEL PI IND STRL 8.5 (GLOVE) ×1 IMPLANT
GLOVE BIOGEL PI INDICATOR 8.5 (GLOVE) ×2
GOWN SPEC L3 XXLG W/TWL (GOWN DISPOSABLE) ×3 IMPLANT
HANDPIECE INTERPULSE COAX TIP (DISPOSABLE) ×3
HEAD FEM STD 32X+1 STRL (Hips) ×2 IMPLANT
HOLDER FOLEY CATH W/STRAP (MISCELLANEOUS) ×3 IMPLANT
HOOD PEEL AWAY FLYTE STAYCOOL (MISCELLANEOUS) ×12 IMPLANT
KIT TURNOVER KIT A (KITS) IMPLANT
LINER ACETABULAR 32X50 (Liner) ×2 IMPLANT
MANIFOLD NEPTUNE II (INSTRUMENTS) ×3 IMPLANT
MARKER SKIN DUAL TIP RULER LAB (MISCELLANEOUS) ×3 IMPLANT
NDL SAFETY ECLIPSE 18X1.5 (NEEDLE) ×1 IMPLANT
NDL SPNL 18GX3.5 QUINCKE PK (NEEDLE) ×1 IMPLANT
NEEDLE HYPO 18GX1.5 SHARP (NEEDLE) ×3
NEEDLE SPNL 18GX3.5 QUINCKE PK (NEEDLE) ×3 IMPLANT
PACK ANTERIOR HIP CUSTOM (KITS) ×3 IMPLANT
SAW OSC TIP CART 19.5X105X1.3 (SAW) ×3 IMPLANT
SEALER BIPOLAR AQUA 6.0 (INSTRUMENTS) ×3 IMPLANT
SET HNDPC FAN SPRY TIP SCT (DISPOSABLE) ×1 IMPLANT
STEM TRI LOC GRIPTION SZ 3 STD IMPLANT
SUT ETHIBOND NAB CT1 #1 30IN (SUTURE) ×6 IMPLANT
SUT ETHILON 2 0 PS N (SUTURE) ×2 IMPLANT
SUT MNCRL AB 3-0 PS2 18 (SUTURE) ×3 IMPLANT
SUT MNCRL AB 4-0 PS2 18 (SUTURE) ×3 IMPLANT
SUT MON AB 2-0 CT1 36 (SUTURE) ×6 IMPLANT
SUT STRATAFIX PDO 1 14 VIOLET (SUTURE) ×3
SUT STRATFX PDO 1 14 VIOLET (SUTURE) ×1
SUT VIC AB 2-0 CT1 27 (SUTURE) ×3
SUT VIC AB 2-0 CT1 TAPERPNT 27 (SUTURE) ×1 IMPLANT
SUTURE STRATFX PDO 1 14 VIOLET (SUTURE) ×1 IMPLANT
SYR 3ML LL SCALE MARK (SYRINGE) ×6 IMPLANT
SYR 50ML LL SCALE MARK (SYRINGE) ×3 IMPLANT
TRAY FOLEY MTR SLVR 16FR STAT (SET/KITS/TRAYS/PACK) IMPLANT
TRI LOC GRIPTION SZ 3 STD ×3 IMPLANT
WATER STERILE IRR 1000ML POUR (IV SOLUTION) ×3 IMPLANT
YANKAUER SUCT BULB TIP 10FT TU (MISCELLANEOUS) ×3 IMPLANT

## 2018-10-04 NOTE — Anesthesia Procedure Notes (Signed)
Spinal  Patient location during procedure: OR End time: 10/04/2018 11:00 AM Staffing Anesthesiologist: Annye Asa, MD Performed: anesthesiologist  Preanesthetic Checklist Completed: patient identified, site marked, surgical consent, pre-op evaluation, timeout performed, IV checked, risks and benefits discussed and monitors and equipment checked Spinal Block Patient position: sitting Prep: site prepped and draped and DuraPrep Patient monitoring: heart rate, cardiac monitor, continuous pulse ox and blood pressure Approach: midline Location: L3-4 Injection technique: single-shot Needle Needle type: Pencan  Needle gauge: 25 G Needle length: 9 cm Additional Notes Pt identified in Operating room.  Monitors applied. Working IV access confirmed. Sterile prep, drape lumbar spine.  1% lido local L 3,4.  #24ga Pencan into clear CSF L 3,4.  12mg  0.75% Bupivacaine with dextrose injected with asp CSF beginning and end of injection.  Patient asymptomatic, VSS, no heme aspirated, tolerated well.  Jenita Seashore, MD

## 2018-10-04 NOTE — Transfer of Care (Signed)
Immediate Anesthesia Transfer of Care Note  Patient: [name]  Procedure(s) Performed: Procedure(s): TOTAL HIP ARTHROPLASTY ANTERIOR APPROACH (Right)  Patient Location: PACU  Anesthesia Type:Spinal  Level of Consciousness:  sedated, patient cooperative and responds to stimulation  Airway & Oxygen Therapy:Patient Spontanous Breathing and Patient connected to face mask oxgen  Post-op Assessment:  Report given to PACU RN and Post -op Vital signs reviewed and stable  Post vital signs:  Reviewed and stable  Last Vitals:  Vitals:   10/04/18 0829  BP: (!) 152/61  Pulse: 73  Resp: 16  Temp: 36.7 C  SpO2: 01%    Complications: No apparent anesthesia complications

## 2018-10-04 NOTE — Discharge Instructions (Signed)
°Dr. Myron Lona °Joint Replacement Specialist °[facility] °[address] °[place], [place] [address] °[phone] ° ° °TOTAL HIP REPLACEMENT POSTOPERATIVE DIRECTIONS ° ° ° °Hip Rehabilitation, Guidelines Following Surgery  ° °WEIGHT BEARING °Weight bearing as tolerated with assist device (walker, cane, etc) as directed, use it as long as suggested by your surgeon or therapist, typically at least 4-6 weeks. ° °The results of a hip operation are greatly improved after range of motion and muscle strengthening exercises. Follow all safety measures which are given to protect your hip. If any of these exercises cause increased pain or swelling in your joint, decrease the amount until you are comfortable again. Then slowly increase the exercises. Call your caregiver if you have problems or questions.  ° °HOME CARE INSTRUCTIONS  °Most of the following instructions are designed to prevent the dislocation of your new hip.  °Remove items at home which could result in a fall. This includes throw rugs or furniture in walking pathways.  °Continue medications as instructed at time of discharge. °· You may have some home medications which will be placed on hold until you complete the course of blood thinner medication. °· You may start showering once you are discharged home. Do not remove your dressing. °Do not put on socks or shoes without following the instructions of your caregivers.   °Sit on chairs with arms. Use the chair arms to help push yourself up when arising.  °Arrange for the use of a toilet seat elevator so you are not sitting low.  °· Walk with walker as instructed.  °You may resume a sexual relationship in one month or when given the OK by your caregiver.  °Use walker as long as suggested by your caregivers.  °You may put full weight on your legs and walk as much as is comfortable. °Avoid periods of inactivity such as sitting longer than an hour when not asleep. This helps prevent  blood clots.  °You may return to work once you are cleared by your surgeon.  °Do not drive a car for 6 weeks or until released by your surgeon.  °Do not drive while taking narcotics.  °Wear elastic stockings for two weeks following surgery during the day but you may remove then at night.  °Make sure you keep all of your appointments after your operation with all of your doctors and caregivers. You should call the office at the above phone number and make an appointment for approximately two weeks after the date of your surgery. °Please pick up a stool softener and laxative for home use as long as you are requiring pain medications. °· ICE to the affected hip every three hours for 30 minutes at a time and then as needed for pain and swelling. Continue to use ice on the hip for pain and swelling from surgery. You may notice swelling that will progress down to the foot and ankle.  This is normal after surgery.  Elevate the leg when you are not up walking on it.   °It is important for you to complete the blood thinner medication as prescribed by your doctor. °· Continue to use the breathing machine which will help keep your temperature down.  It is common for your temperature to cycle up and down following surgery, especially at night when you are not up moving around and exerting yourself.  The breathing machine keeps your lungs expanded and your temperature down. ° °RANGE OF MOTION AND STRENGTHENING EXERCISES  °These exercises are   designed to help you keep full movement of your hip joint. Follow your caregiver's or physical therapist's instructions. Perform all exercises about fifteen times, three times per day or as directed. Exercise both hips, even if you have had only one joint replacement. These exercises can be done on a training (exercise) mat, on the floor, on a table or on a bed. Use whatever works the best and is most comfortable for you. Use music or television while you are exercising so that the exercises  are a pleasant break in your day. This will make your life better with the exercises acting as a break in routine you can look forward to.  °Lying on your back, slowly slide your foot toward your buttocks, raising your knee up off the floor. Then slowly slide your foot back down until your leg is straight again.  °Lying on your back spread your legs as far apart as you can without causing discomfort.  °Lying on your side, raise your upper leg and foot straight up from the floor as far as is comfortable. Slowly lower the leg and repeat.  °Lying on your back, tighten up the muscle in the front of your thigh (quadriceps muscles). You can do this by keeping your leg straight and trying to raise your heel off the floor. This helps strengthen the largest muscle supporting your knee.  °Lying on your back, tighten up the muscles of your buttocks both with the legs straight and with the knee bent at a comfortable angle while keeping your heel on the floor.  ° °SKILLED REHAB INSTRUCTIONS: °If the patient is transferred to a skilled rehab facility following release from the hospital, a list of the current medications will be sent to the facility for the patient to continue.  When discharged from the skilled rehab facility, please have the facility set up the patient's Home Health Physical Therapy prior to being released. Also, the skilled facility will be responsible for providing the patient with their medications at time of release from the facility to include their pain medication and their blood thinner medication. If the patient is still at the rehab facility at time of the two week follow up appointment, the skilled rehab facility will also need to assist the patient in arranging follow up appointment in our office and any transportation needs. ° °MAKE SURE YOU:  °Understand these instructions.  °Will watch your condition.  °Will get help right away if you are not doing well or get worse. ° °Pick up stool softner and  laxative for home use following surgery while on pain medications. °Do not remove your dressing. °The dressing is waterproof--it is OK to take showers. °Continue to use ice for pain and swelling after surgery. °Do not use any lotions or creams on the incision until instructed by your surgeon. °Total Hip Protocol. ° ° °

## 2018-10-04 NOTE — Op Note (Signed)
OPERATIVE REPORT  SURGEON: Rod Can, MD   ASSISTANT: Staff.  PREOPERATIVE DIAGNOSIS: Right hip arthritis.   POSTOPERATIVE DIAGNOSIS: Right hip arthritis.   PROCEDURE: Right total hip arthroplasty, anterior approach.   IMPLANTS: DePuy Tri Lock stem, size 3, std offset. DePuy Pinnacle Cup, size 50 mm. DePuy Altrx liner, size 32 by 50 mm, neutral. DePuy metal head ball, size 32 + 1 mm.  ANESTHESIA:  Spinal  ESTIMATED BLOOD LOSS:-300 mL    ANTIBIOTICS: 2 g Ancef.  DRAINS: None.  COMPLICATIONS: None.   CONDITION: PACU - hemodynamically stable.   BRIEF CLINICAL NOTE: [name] is a 85 y.o. female with a long-standing history of Right hip arthritis. After failing conservative management, the patient was indicated for total hip arthroplasty. The risks, benefits, and alternatives to the procedure were explained, and the patient elected to proceed.  PROCEDURE IN DETAIL: Surgical site was marked by myself in the pre-op holding area. Once inside the operating room, spinal anesthesia was obtained, and a foley catheter was inserted. The patient was then positioned on the Hana table. All bony prominences were well padded. The hip was prepped and draped in the normal sterile surgical fashion. A time-out was called verifying side and site of surgery. The patient received IV antibiotics within 60 minutes of beginning the procedure.  The direct anterior approach to the hip was performed through the Hueter interval. Lateral femoral circumflex vessels were treated with the Auqumantys. The anterior capsule was exposed and an inverted T capsulotomy was made.The femoral neck cut was made to the level of the templated cut. A corkscrew was placed into the head and the head was removed. The femoral head was found to have eburnated bone. The head was passed to the back table and was measured.  Acetabular exposure was achieved, and the pulvinar and labrum were excised. Sequential  reaming of the acetabulum was then performed up to a size 49 mm reamer. A 50 mm cup was then opened and impacted into place at approximately 40 degrees of abduction and 20 degrees of anteversion. The final polyethylene liner was impacted into place and acetabular osteophytes were removed.   I then gained femoral exposure taking care to protect the abductors and greater trochanter. This was performed using standard external rotation, extension, and adduction. The capsule was peeled off the inner aspect of the greater trochanter, taking care to preserve the short external rotators. A cookie cutter was used to enter the femoral canal, and then the femoral canal finder was placed. Sequential broaching was performed up to a size 3. Calcar planer was used on the femoral neck remnant. I placed a std offset neck and a trial head ball. The hip was reduced. Leg lengths and offset were checked fluoroscopically. The hip was dislocated and trial components were removed. The final implants were placed, and the hip was reduced.  Fluoroscopy was used to confirm component position and leg lengths. At 90 degrees of external rotation and full extension, the hip was stable to an anterior directed force.  The wound was copiously irrigated with normal saline using pulse lavage. Marcaine solution was injected into the periarticular soft tissue. The wound was closed in layers using #1 Vicryl and V-Loc for the fascia, 2-0 Vicryl for the subcutaneous fat, 2-0 Monocryl for the deep dermal layer, 3-0 running Monocryl subcuticular stitch, and Dermabond for the skin. Once the glue was fully dried, an Aquacell Ag dressing was applied. The patient was transported to the recovery room in stable condition.  Sponge, needle, and instrument counts were correct at the end of the case x2. The patient tolerated the procedure well and there were no known complications.

## 2018-10-04 NOTE — Interval H&P Note (Signed)
History and Physical Interval Note:  10/04/2018 9:54 AM  [name]  has presented today for surgery, with the diagnosis of Right hip osteoarthritis.  The various methods of treatment have been discussed with the patient and family. After consideration of risks, benefits and other options for treatment, the patient has consented to  Procedure(s): TOTAL HIP ARTHROPLASTY ANTERIOR APPROACH (Right) as a surgical intervention.  The patient's history has been reviewed, patient examined, no change in status, stable for surgery.  I have reviewed the patient's chart and labs.  Questions were answered to the patient's satisfaction.     Hilton Cork Devonia Farro

## 2018-10-04 NOTE — Anesthesia Postprocedure Evaluation (Signed)
Anesthesia Post Note  Patient: [name]  Procedure(s) Performed: TOTAL HIP ARTHROPLASTY ANTERIOR APPROACH (Right Hip)     Patient location during evaluation: PACU Anesthesia Type: Spinal Level of consciousness: awake and alert, patient cooperative and oriented Vital Signs Assessment: post-procedure vital signs reviewed and stable Respiratory status: spontaneous breathing, nonlabored ventilation and respiratory function stable Cardiovascular status: blood pressure returned to baseline and stable Postop Assessment: patient able to bend at knees and no apparent nausea or vomiting Anesthetic complications: no    Last Vitals:  Vitals:   10/04/18 1409 10/04/18 1521  BP: (!) 112/52 (!) 129/52  Pulse: (!) 59 60  Resp: 16 16  Temp:  (!) 36.4 C  SpO2: 100% 100%    Last Pain:  Vitals:   10/04/18 1521  TempSrc: Oral  PainSc:                  Elleni Mozingo,E. Colbi Schiltz

## 2018-10-05 ENCOUNTER — Encounter (HOSPITAL_COMMUNITY): Payer: Self-pay | Admitting: Orthopedic Surgery

## 2018-10-05 LAB — CBC
HCT: 30.4 % — ABNORMAL LOW (ref 36.0–46.0)
Hemoglobin: 9.6 g/dL — ABNORMAL LOW (ref 12.0–15.0)
MCH: 29.9 pg (ref 26.0–34.0)
MCHC: 31.6 g/dL (ref 30.0–36.0)
MCV: 94.7 fL (ref 80.0–100.0)
Platelets: 226 10*3/uL (ref 150–400)
RBC: 3.21 MIL/uL — ABNORMAL LOW (ref 3.87–5.11)
RDW: 13.2 % (ref 11.5–15.5)
WBC: 13.2 10*3/uL — ABNORMAL HIGH (ref 4.0–10.5)
nRBC: 0 % (ref 0.0–0.2)

## 2018-10-05 LAB — BASIC METABOLIC PANEL
Anion gap: 9 (ref 5–15)
BUN: 18 mg/dL (ref 8–23)
CO2: 18 mmol/L — ABNORMAL LOW (ref 22–32)
Calcium: 8.4 mg/dL — ABNORMAL LOW (ref 8.9–10.3)
Chloride: 112 mmol/L — ABNORMAL HIGH (ref 98–111)
Creatinine, Ser: 0.88 mg/dL (ref 0.44–1.00)
GFR calc Af Amer: >60 mL/min (ref >60)
GFR calc non Af Amer: 58 mL/min — ABNORMAL LOW (ref >60)
Glucose, Bld: 171 mg/dL — ABNORMAL HIGH (ref 70–99)
Potassium: 3.9 mmol/L (ref 3.5–5.1)
Sodium: 139 mmol/L (ref 135–145)

## 2018-10-05 MED ORDER — HYDROMORPHONE HCL 2 MG PO TABS
4.0000 mg | ORAL_TABLET | ORAL | Status: DC | PRN
  Administered 2018-10-05: 15:00:00 4 mg via ORAL
  Filled 2018-10-05: qty 2

## 2018-10-05 MED ORDER — DOCUSATE SODIUM 100 MG PO CAPS: 100.0000 mg | ORAL_CAPSULE | Freq: Two times a day (BID) | ORAL | 1 refills | Status: DC

## 2018-10-05 MED ORDER — SENNA 8.6 MG PO TABS: 1.0000 | ORAL_TABLET | Freq: Two times a day (BID) | ORAL | 0 refills | Status: DC

## 2018-10-05 MED ORDER — HYDROMORPHONE HCL 4 MG PO TABS: 4.0000 mg | ORAL_TABLET | ORAL | 0 refills | Status: DC | PRN

## 2018-10-05 MED ORDER — ONDANSETRON HCL 4 MG PO TABS: 4.0000 mg | ORAL_TABLET | Freq: Four times a day (QID) | ORAL | 0 refills | Status: DC | PRN

## 2018-10-05 MED ORDER — ACETAMINOPHEN 325 MG PO TABS: 325.0000 mg | ORAL_TABLET | Freq: Four times a day (QID) | ORAL | 0 refills | Status: DC | PRN

## 2018-10-05 MED ORDER — ASPIRIN 81 MG PO CHEW: 81.0000 mg | CHEWABLE_TABLET | Freq: Two times a day (BID) | ORAL | 1 refills | Status: DC

## 2018-10-05 NOTE — Discharge Summary (Signed)
Physician Discharge Summary  Patient ID: MARQUE BANGO MRN: [id number] DOB/AGE: January 07, 1926 85 y.o.  Admit date: 10/04/2018 Discharge date: 10/05/2018  Admission Diagnoses:  Osteoarthritis of right hip  Discharge Diagnoses:  Principal Problem:   Osteoarthritis of right hip   Past Medical History:  Diagnosis Date  . Adenomatous colon polyp 04/26/95   tubulovillous  . Allergy   . Arthritis   . Bradycardia   . Bursitis   . Cataracts, bilateral   . Constipation   . COPD, moderate ([place])    "THIS WAS TOLD TO ME BACK IN THE DAYS WHEN EVERYONE THAT WAS  A SMOKER WAS TOLD THEY WERE A COPD. IM 90 AND I DONT GET SHORT OF BREATH , I DONT HAVE IT "  . Cystitis   . Degenerative joint disease   . Depression    DENIES   . Gastritis   . GERD (gastroesophageal reflux disease)   . Glaucoma   . Hypoglycemia   . Impaired memory   . Insomnia   . Lack of bladder control   . Macular degeneration   . OA (osteoarthritis)   . Osteopenia   . Renal cyst    right  . Shortness of breath   . Small bowel obstruction ([place])   . Trigeminal neuralgia    DENIES   . Urinary incontinence   . Wears dentures     Surgeries: Procedure(s): TOTAL HIP ARTHROPLASTY ANTERIOR APPROACH on 10/04/2018   Consultants (if any):   Discharged Condition: Improved  Hospital Course: NYLA CREASON is an 85 y.o. female who was admitted 10/04/2018 with a diagnosis of Osteoarthritis of right hip and went to the operating room on 10/04/2018 and underwent the above named procedures.    She was given perioperative antibiotics:  Anti-infectives (From admission, onward)   Start     Dose/Rate Route Frequency Ordered Stop   10/04/18 1700  ceFAZolin (ANCEF) IVPB 1 g/50 mL premix     1 g 100 mL/hr over 30 Minutes Intravenous Every 6 hours 10/04/18 1504 10/04/18 2357   10/04/18 1000  ceFAZolin (ANCEF) IVPB 2g/100 mL premix     2 g 200 mL/hr over 30 Minutes Intravenous On call to O.R. 10/04/18 9030 10/04/18 1059    .  She  was given sequential compression devices, early ambulation, and ASA for DVT prophylaxis.  She benefited maximally from the hospital stay and there were no complications.    Recent vital signs:  Vitals:   10/05/18 0104 10/05/18 0533  BP: (!) 114/46 (!) 144/54  Pulse: (!) 51 (!) 56  Resp: 16 14  Temp: 98.2 F (36.8 C) 97.6 F (36.4 C)  SpO2: 98% 100%    Recent laboratory studies:  Lab Results  Component Value Date   HGB 9.6 (L) 10/05/2018   HGB 12.9 09/24/2018   HGB 12.7 11/09/2017   Lab Results  Component Value Date   WBC 13.2 (H) 10/05/2018   PLT 226 10/05/2018   Lab Results  Component Value Date   INR 0.92 06/28/2012   Lab Results  Component Value Date   NA 139 10/05/2018   K 3.9 10/05/2018   CL 112 (H) 10/05/2018   CO2 18 (L) 10/05/2018   BUN 18 10/05/2018   CREATININE 0.88 10/05/2018   GLUCOSE 171 (H) 10/05/2018    Discharge Medications:   Allergies as of 10/05/2018      Reactions   Codeine Itching, Nausea Only   Small amounts okay   Pregabalin Other (See Comments)  Confusion and hallucinations   Promethazine Hcl Other (See Comments)   Muscle cramps   Sulfonamide Derivatives Nausea Only      Medication List    STOP taking these medications   aspirin EC 81 MG tablet Replaced by: aspirin 81 MG chewable tablet   morphine 15 MG tablet Commonly known as: MSIR     TAKE these medications   acetaminophen 325 MG tablet Commonly known as: TYLENOL Take 1-2 tablets (325-650 mg total) by mouth every 6 (six) hours as needed for mild pain (pain score 1-3 or temp > 100.5).   aspirin 81 MG chewable tablet Chew 1 tablet (81 mg total) by mouth 2 (two) times daily. Replaces: aspirin EC 81 MG tablet   beta carotene w/minerals tablet Take 1 tablet by mouth 2 (two) times daily.   bisacodyl 5 MG EC tablet Commonly known as: DULCOLAX Take 5 mg by mouth daily as needed for moderate constipation.   bisacodyl 10 MG suppository Commonly known as:  DULCOLAX Place 10 mg rectally daily as needed for moderate constipation.   docusate sodium 100 MG capsule Commonly known as: COLACE Take 1 capsule (100 mg total) by mouth 2 (two) times daily.   dorzolamide-timolol 22.3-6.8 MG/ML ophthalmic solution Commonly known as: COSOPT Place 1 drop into both eyes 2 (two) times daily.   HYDROmorphone 4 MG tablet Commonly known as: DILAUDID Take 1 tablet (4 mg total) by mouth every 4 (four) hours as needed for severe pain.   omeprazole 20 MG capsule Commonly known as: PRILOSEC Take 20 mg by mouth daily before breakfast.   ondansetron 4 MG tablet Commonly known as: ZOFRAN Take 1 tablet (4 mg total) by mouth every 6 (six) hours as needed for nausea.   polyethylene glycol 17 g packet Commonly known as: MIRALAX / GLYCOLAX Take 17 g by mouth daily as needed for moderate constipation.   senna 8.6 MG Tabs tablet Commonly known as: SENOKOT Take 1 tablet (8.6 mg total) by mouth 2 (two) times daily.   Travatan Z 0.004 % Soln ophthalmic solution Generic drug: Travoprost (BAK Free) Place 1 drop into both eyes at bedtime.            Durable Medical Equipment  (From admission, onward)         Start     Ordered   10/05/18 1046  For home use only DME Walker rolling  Once    Question:  Patient needs a walker to treat with the following condition  Answer:  Status post left hip replacement   10/05/18 1045          Diagnostic Studies: Dg Pelvis Portable  Result Date: 10/04/2018 CLINICAL DATA:  Status post right total hip replacement. EXAM: PORTABLE PELVIS 1-2 VIEWS COMPARISON:  Fluoroscopic images of same day. FINDINGS: The right femoral and acetabular components appear to be well situated. Expected postoperative changes are seen in the surrounding soft tissues. Previously placed left hip prosthesis is also noted. No fracture or dislocation is noted. IMPRESSION: Status post right total hip arthroplasty. Electronically Signed   By: Marijo Conception M.D.   On: 10/04/2018 13:33   Dg C-arm 1-60 Min-no Report  Result Date: 10/04/2018 CLINICAL DATA:  Right total hip arthroplasty. EXAM: OPERATIVE right HIP (WITH PELVIS IF PERFORMED) 2 VIEWS TECHNIQUE: Fluoroscopic spot image(s) were submitted for interpretation post-operatively. COMPARISON:  None. FINDINGS: Well seated components of a total right hip arthroplasty without complicating features. The patient also has a left total hip arthroplasty. The  bony pelvis appears intact. IMPRESSION: Well seated components of a total right hip arthroplasty. No complicating features are identified. Electronically Signed   By: Marijo Sanes M.D.   On: 10/04/2018 12:57   Dg Hip Operative Unilat W Or W/o Pelvis Right  Result Date: 10/04/2018 CLINICAL DATA:  Right total hip arthroplasty. EXAM: OPERATIVE right HIP (WITH PELVIS IF PERFORMED) 2 VIEWS TECHNIQUE: Fluoroscopic spot image(s) were submitted for interpretation post-operatively. COMPARISON:  None. FINDINGS: Well seated components of a total right hip arthroplasty without complicating features. The patient also has a left total hip arthroplasty. The bony pelvis appears intact. IMPRESSION: Well seated components of a total right hip arthroplasty. No complicating features are identified. Electronically Signed   By: Marijo Sanes M.D.   On: 10/04/2018 12:57    Disposition: Discharge disposition: 01-Home or Self Care       Discharge Instructions    Call MD / Call 911   Complete by: As directed    If you experience chest pain or shortness of breath, CALL 911 and be transported to the hospital emergency room.  If you develope a fever above 101 F, pus (white drainage) or increased drainage or redness at the wound, or calf pain, call your surgeon's office.   Constipation Prevention   Complete by: As directed    Drink plenty of fluids.  Prune juice may be helpful.  You may use a stool softener, such as Colace (over the counter) 100 mg twice a day.  Use MiraLax  (over the counter) for constipation as needed.   Diet - low sodium heart healthy   Complete by: As directed    Driving restrictions   Complete by: As directed    No driving for 6 weeks   Increase activity slowly as tolerated   Complete by: As directed    Lifting restrictions   Complete by: As directed    No lifting for 6 weeks   TED hose   Complete by: As directed    Use stockings (TED hose) for 2 weeks on both leg(s).  You may remove them at night for sleeping.      Follow-up Information    Suhaan Perleberg, Aaron Edelman, MD. Schedule an appointment as soon as possible for a visit in 2 weeks.   Specialty: Orthopedic Surgery Why: For wound re-check Contact information: [address] [place] [address] [phone]            Signed: Hilton Cork Wilkie Zenon 10/05/2018, 1:10 PM

## 2018-10-05 NOTE — Evaluation (Signed)
Physical Therapy Evaluation Patient Details Name: [name] MRN: [id number] DOB: Mar 17, 1926 Today's Date: 10/05/2018   History of Present Illness  R AA-THA; PMH of R TSA 2014, lumbar fusion 2010  Clinical Impression  Pt is s/p THA resulting in the deficits listed below (see PT Problem List). Pt ambulated 25' with RW, distance limited by pain. Initiated HEP. Pt lives in [place] where she plans to have a 24* caregiver upon acute DC.  Pt will benefit from skilled PT to increase their independence and safety with mobility to allow discharge to the venue listed below.      Follow Up Recommendations Follow surgeon's recommendation for DC plan and follow-up therapies;Home health PT(HHPT recommended)    Equipment Recommendations  Rolling walker with 5" wheels    Recommendations for Other Services       Precautions / Restrictions Precautions Precautions: Fall Restrictions Weight Bearing Restrictions: No Other Position/Activity Restrictions: WBAT      Mobility  Bed Mobility Overal bed mobility: Needs Assistance Bed Mobility: Rolling;Sidelying to Sit Rolling: Min assist Sidelying to sit: Mod assist;HOB elevated       General bed mobility comments: assist to initiate roll and to raise trunk  Transfers Overall transfer level: Needs assistance Equipment used: Rolling walker (2 wheeled) Transfers: Sit to/from Stand Sit to Stand: Min assist;From elevated surface;+2 safety/equipment         General transfer comment: VCs hand placement  Ambulation/Gait Ambulation/Gait assistance: Min guard Gait Distance (Feet): 25 Feet Assistive device: Rolling walker (2 wheeled) Gait Pattern/deviations: Step-to pattern;Decreased stride length;Trunk flexed Gait velocity: decr   General Gait Details: VCs sequencing and posture, distance limited by pain  Stairs            Wheelchair Mobility    Modified Rankin (Stroke Patients Only)       Balance Overall balance assessment:  Needs assistance   Sitting balance-Leahy Scale: Good     Standing balance support: Bilateral upper extremity supported Standing balance-Leahy Scale: Fair                               Pertinent Vitals/Pain Pain Assessment: 0-10 Pain Score: 9  Pain Location: R hip Pain Descriptors / Indicators: Sore;Guarding;Grimacing Pain Intervention(s): Limited activity within patient's tolerance;Monitored during session;Premedicated before session;Ice applied    Home Living Family/patient expects to be discharged to:: Private residence Living Arrangements: Alone Available Help at Discharge: Personal care attendant;Available 24 hours/day Type of Home: Independent living facility       Home Layout: One level Home Equipment: [place] - 4 wheels;Cane - single point      Prior Function Level of Independence: Needs assistance      ADL's / Homemaking Assistance Needed: aide assists with bathing/dressing  Comments: walks with SPC in apartment, uses rollator going out; pt stated she will have an aide 24*/day initially, and that she can get PT at her facility     Hand Dominance        Extremity/Trunk Assessment   Upper Extremity Assessment Upper Extremity Assessment: Overall WFL for tasks assessed    Lower Extremity Assessment Lower Extremity Assessment: RLE deficits/detail RLE Deficits / Details: R hip flexion AAROM 30* limited by pain, knee ext at least 3/5, ankle WNL RLE: Unable to fully assess due to pain    Cervical / Trunk Assessment Cervical / Trunk Assessment: Kyphotic  Communication   Communication: No difficulties  Cognition Arousal/Alertness: Awake/alert Behavior During Therapy: [facility] for  tasks assessed/performed Overall Cognitive Status: Within Functional Limits for tasks assessed                                        General Comments      Exercises Total Joint Exercises Ankle Circles/Pumps: AROM;Both;10 reps;Supine Heel Slides:  AAROM;Right;5 reps;Supine Hip ABduction/ADduction: AAROM;Right;5 reps;Supine Long Arc Quad: AROM;Right;10 reps;Seated   Assessment/Plan    PT Assessment Patient needs continued PT services  PT Problem List Decreased strength;Decreased range of motion;Decreased activity tolerance;Decreased balance;Decreased knowledge of use of DME;Decreased mobility;Pain       PT Treatment Interventions DME instruction;Gait training;Functional mobility training;Therapeutic exercise;Therapeutic activities;Patient/family education    PT Goals (Current goals can be found in the Care Plan section)  Acute Rehab PT Goals Patient Stated Goal: decrease pain PT Goal Formulation: With patient Time For Goal Achievement: 10/12/18 Potential to Achieve Goals: Good    Frequency 7X/week   Barriers to discharge        Co-evaluation               AM-PAC PT "6 Clicks" Mobility  Outcome Measure Help needed turning from your back to your side while in a flat bed without using bedrails?: A Little Help needed moving from lying on your back to sitting on the side of a flat bed without using bedrails?: A Lot Help needed moving to and from a bed to a chair (including a wheelchair)?: A Little Help needed standing up from a chair using your arms (e.g., wheelchair or bedside chair)?: A Lot Help needed to walk in hospital room?: A Little Help needed climbing 3-5 steps with a railing? : A Lot 6 Click Score: 15    End of Session Equipment Utilized During Treatment: Gait belt Activity Tolerance: Patient limited by pain Patient left: in chair;with call bell/phone within reach;with chair alarm set Nurse Communication: Mobility status PT Visit Diagnosis: Difficulty in walking, not elsewhere classified (R26.2);Pain Pain - Right/Left: Right Pain - part of body: Hip    Time: 8588-5027 PT Time Calculation (min) (ACUTE ONLY): 23 min   Charges:   PT Evaluation $PT Eval Low Complexity: 1 Low PT Treatments $Gait  Training: 8-22 mins        Blondell Reveal Kistler PT 10/05/2018  Acute Rehabilitation Services Pager [phone] Office [phone]

## 2018-10-05 NOTE — Progress Notes (Signed)
    Subjective:  Patient reports pain as severe.  Denies N/V/CP/SOB. C/o poor pain control with PO 2mg  dilaudid.  Objective:   VITALS:   Vitals:   10/04/18 1724 10/04/18 2130 10/05/18 0104 10/05/18 0533  BP: (!) 143/47 (!) 128/52 (!) 114/46 (!) 144/54  Pulse: 70 64 (!) 51 (!) 56  Resp: 16 12 16 14   Temp: 97.9 F (36.6 C) 98.2 F (36.8 C) 98.2 F (36.8 C) 97.6 F (36.4 C)  TempSrc: Oral Oral Oral Oral  SpO2: 100% 100% 98% 100%  Weight:      Height:        NAD ABD soft Sensation intact distally Intact pulses distally Dorsiflexion/Plantar flexion intact Incision: dressing C/D/I Compartment soft   Lab Results  Component Value Date   WBC 13.2 (H) 10/05/2018   HGB 9.6 (L) 10/05/2018   HCT 30.4 (L) 10/05/2018   MCV 94.7 10/05/2018   PLT 226 10/05/2018   BMET    Component Value Date/Time   NA 139 10/05/2018 0452   NA 142 02/16/2016   K 3.9 10/05/2018 0452   CL 112 (H) 10/05/2018 0452   CO2 18 (L) 10/05/2018 0452   GLUCOSE 171 (H) 10/05/2018 0452   BUN 18 10/05/2018 0452   BUN 14 02/16/2016   CREATININE 0.88 10/05/2018 0452   CALCIUM 8.4 (L) 10/05/2018 0452   GFRNONAA 58 (L) 10/05/2018 0452   GFRAA >60 10/05/2018 0452     Assessment/Plan: 1 Day Post-Op   Principal Problem:   Osteoarthritis of right hip   WBAT with walker DVT ppx: Aspirin, SCDs, TEDS PO pain control PT/OT Dispo: will increase PO dilaudid, d/c home with HEP   [name] 10/05/2018, 1:04 PM   Rod Can, MD Cell: [phone] [name] is now [facility]  [place] [address], [place], [place] [address] Phone: [phone] [email or web address] Facebook  [employer]

## 2018-10-05 NOTE — Progress Notes (Signed)
Physical Therapy Treatment Patient Details Name: [name] MRN: [id number] DOB: 1925/11/27 Today's Date: 10/05/2018    History of Present Illness R AA-THA; PMH of R TSA 2014, lumbar fusion 2010    PT Comments    Pt ambulated 150' with RW, no loss of balance. Reviewed THA HEP. Pt has no stairs at her home, so stair training deferred. Pt is ready to DC home from PT standpoint. Pt stated she has arranged for 24* private aides.    Follow Up Recommendations  Follow surgeon's recommendation for DC plan and follow-up therapies;Home health PT(HHPT recommended)     Equipment Recommendations  Rolling walker with 5" wheels    Recommendations for Other Services       Precautions / Restrictions Precautions Precautions: Fall Restrictions Weight Bearing Restrictions: No Other Position/Activity Restrictions: WBAT    Mobility  Bed Mobility               General bed mobility comments: up in recliner  Transfers Overall transfer level: Needs assistance Equipment used: Rolling walker (2 wheeled) Transfers: Sit to/from Stand Sit to Stand: Min guard         General transfer comment: VCs hand placement  Ambulation/Gait Ambulation/Gait assistance: Min guard Gait Distance (Feet): 150 Feet Assistive device: Rolling walker (2 wheeled) Gait Pattern/deviations: Step-to pattern;Decreased stride length;Trunk flexed Gait velocity: decr   General Gait Details: VCs sequencing and posture, distance limited by pain   Stairs             Wheelchair Mobility    Modified Rankin (Stroke Patients Only)       Balance Overall balance assessment: Needs assistance   Sitting balance-Leahy Scale: Good     Standing balance support: Bilateral upper extremity supported Standing balance-Leahy Scale: Fair                              Cognition Arousal/Alertness: Awake/alert Behavior During Therapy: WFL for tasks assessed/performed Overall Cognitive Status:  Within Functional Limits for tasks assessed                                        Exercises Total Joint Exercises Ankle Circles/Pumps: AROM;Both;10 reps;Supine Heel Slides: AAROM;Right;5 reps;Supine Hip ABduction/ADduction: AAROM;Right;5 reps;Supine Long Arc Quad: AROM;Right;10 reps;Seated    General Comments        Pertinent Vitals/Pain Pain Score: 10-Worst pain ever Pain Location: R hip Pain Descriptors / Indicators: Sore Pain Intervention(s): Limited activity within patient's tolerance;Monitored during session;Premedicated before session    Home Living                      Prior Function            PT Goals (current goals can now be found in the care plan section) Acute Rehab PT Goals Patient Stated Goal: decrease pain PT Goal Formulation: With patient Time For Goal Achievement: 10/12/18 Potential to Achieve Goals: Good Progress towards PT goals: Progressing toward goals    Frequency    7X/week      PT Plan Current plan remains appropriate    Co-evaluation              AM-PAC PT "6 Clicks" Mobility   Outcome Measure  Help needed turning from your back to your side while in a flat bed without using bedrails?: A Little Help  needed moving from lying on your back to sitting on the side of a flat bed without using bedrails?: A Little Help needed moving to and from a bed to a chair (including a wheelchair)?: A Little Help needed standing up from a chair using your arms (e.g., wheelchair or bedside chair)?: A Little Help needed to walk in hospital room?: A Little Help needed climbing 3-5 steps with a railing? : A Little 6 Click Score: 18    End of Session Equipment Utilized During Treatment: Gait belt Activity Tolerance: Patient limited by pain Patient left: in chair;with call bell/phone within reach;with chair alarm set Nurse Communication: Mobility status PT Visit Diagnosis: Difficulty in walking, not elsewhere classified  (R26.2);Pain Pain - Right/Left: Right Pain - part of body: Hip     Time: 4403-4742 PT Time Calculation (min) (ACUTE ONLY): 15 min  Charges:  $Therapeutic Exercise: 8-22 mins                     Blondell Reveal Kistler PT 10/05/2018  Acute Rehabilitation Services Pager [phone] Office [phone]

## 2018-10-05 NOTE — TOC Transition Note (Signed)
Transition of Care [facility]) - CM/SW Discharge Note   Patient Details  Name: [name] MRN: [id number] Date of Birth: 1926-03-05  Transition of Care [facility]) CM/SW Contact:  Lia Hopping, [place] Phone Number: 10/05/2018, 11:27 AM   Clinical Narrative: HEP    Need physician to sign DME order for rolling walker.    Final next level of care: Other (comment)(HEP) Barriers to Discharge: No Barriers Identified   Patient Goals and CMS Choice        Discharge Placement  Home :[facility]                     Discharge Plan and Services                DME Arranged: Walker rolling DME Agency: [employer] Date DME Agency Contacted: 10/05/18 Time DME Agency Contacted: [id number] Representative spoke with at DME Agency: [place] ([place]) Interventions     Readmission Risk Interventions No flowsheet data found.

## 2018-10-05 NOTE — Plan of Care (Signed)
Continued current POC

## 2018-10-19 DIAGNOSIS — Z471 Aftercare following joint replacement surgery: Secondary | ICD-10-CM | POA: Diagnosis not present

## 2018-10-19 DIAGNOSIS — Z96641 Presence of right artificial hip joint: Secondary | ICD-10-CM | POA: Diagnosis not present

## 2018-10-24 ENCOUNTER — Encounter
Admission: RE | Admit: 2018-10-24 | Discharge: 2018-10-24 | Disposition: A | Discharge status: Home / Self Care | Payer: Medicare Other | Source: Ambulatory Visit | Attending: Internal Medicine | Admitting: Internal Medicine

## 2018-10-24 DIAGNOSIS — M5416 Radiculopathy, lumbar region: Secondary | ICD-10-CM | POA: Diagnosis not present

## 2018-10-24 DIAGNOSIS — G894 Chronic pain syndrome: Secondary | ICD-10-CM | POA: Diagnosis not present

## 2018-10-24 DIAGNOSIS — M545 Low back pain: Secondary | ICD-10-CM | POA: Diagnosis not present

## 2018-10-24 DIAGNOSIS — M25551 Pain in right hip: Secondary | ICD-10-CM | POA: Diagnosis not present

## 2018-11-01 ENCOUNTER — Emergency Department: Payer: Medicare Other

## 2018-11-01 ENCOUNTER — Other Ambulatory Visit: Payer: Self-pay

## 2018-11-01 ENCOUNTER — Inpatient Hospital Stay
Admission: EM | Admit: 2018-11-01 | Discharge: 2018-11-05 | DRG: 556 | Disposition: A | Discharge status: Skilled Nursing Facility | Payer: Medicare Other | Attending: Internal Medicine | Admitting: Internal Medicine

## 2018-11-01 DIAGNOSIS — R2689 Other abnormalities of gait and mobility: Secondary | ICD-10-CM | POA: Diagnosis present

## 2018-11-01 DIAGNOSIS — J449 Chronic obstructive pulmonary disease, unspecified: Secondary | ICD-10-CM | POA: Diagnosis present

## 2018-11-01 DIAGNOSIS — K219 Gastro-esophageal reflux disease without esophagitis: Secondary | ICD-10-CM | POA: Diagnosis present

## 2018-11-01 DIAGNOSIS — R531 Weakness: Secondary | ICD-10-CM | POA: Diagnosis present

## 2018-11-01 DIAGNOSIS — Z1159 Encounter for screening for other viral diseases: Secondary | ICD-10-CM | POA: Diagnosis not present

## 2018-11-01 DIAGNOSIS — M8580 Other specified disorders of bone density and structure, unspecified site: Secondary | ICD-10-CM | POA: Diagnosis present

## 2018-11-01 DIAGNOSIS — M25572 Pain in left ankle and joints of left foot: Secondary | ICD-10-CM | POA: Diagnosis not present

## 2018-11-01 DIAGNOSIS — Z981 Arthrodesis status: Secondary | ICD-10-CM

## 2018-11-01 DIAGNOSIS — Z9181 History of falling: Secondary | ICD-10-CM

## 2018-11-01 DIAGNOSIS — W19XXXA Unspecified fall, initial encounter: Secondary | ICD-10-CM | POA: Diagnosis not present

## 2018-11-01 DIAGNOSIS — M25561 Pain in right knee: Secondary | ICD-10-CM | POA: Diagnosis not present

## 2018-11-01 DIAGNOSIS — N390 Urinary tract infection, site not specified: Secondary | ICD-10-CM | POA: Diagnosis not present

## 2018-11-01 DIAGNOSIS — Z96641 Presence of right artificial hip joint: Secondary | ICD-10-CM | POA: Diagnosis not present

## 2018-11-01 DIAGNOSIS — M25551 Pain in right hip: Secondary | ICD-10-CM | POA: Diagnosis not present

## 2018-11-01 DIAGNOSIS — R262 Difficulty in walking, not elsewhere classified: Secondary | ICD-10-CM | POA: Diagnosis not present

## 2018-11-01 DIAGNOSIS — N3000 Acute cystitis without hematuria: Secondary | ICD-10-CM | POA: Diagnosis not present

## 2018-11-01 DIAGNOSIS — Z8249 Family history of ischemic heart disease and other diseases of the circulatory system: Secondary | ICD-10-CM

## 2018-11-01 DIAGNOSIS — Z96643 Presence of artificial hip joint, bilateral: Secondary | ICD-10-CM | POA: Diagnosis not present

## 2018-11-01 DIAGNOSIS — I10 Essential (primary) hypertension: Secondary | ICD-10-CM | POA: Diagnosis not present

## 2018-11-01 DIAGNOSIS — S299XXA Unspecified injury of thorax, initial encounter: Secondary | ICD-10-CM | POA: Diagnosis not present

## 2018-11-01 DIAGNOSIS — Z833 Family history of diabetes mellitus: Secondary | ICD-10-CM

## 2018-11-01 DIAGNOSIS — Z87891 Personal history of nicotine dependence: Secondary | ICD-10-CM

## 2018-11-01 DIAGNOSIS — S8991XA Unspecified injury of right lower leg, initial encounter: Secondary | ICD-10-CM | POA: Diagnosis not present

## 2018-11-01 DIAGNOSIS — K5900 Constipation, unspecified: Secondary | ICD-10-CM | POA: Diagnosis present

## 2018-11-01 DIAGNOSIS — Z96611 Presence of right artificial shoulder joint: Secondary | ICD-10-CM | POA: Diagnosis present

## 2018-11-01 DIAGNOSIS — Z03818 Encounter for observation for suspected exposure to other biological agents ruled out: Secondary | ICD-10-CM | POA: Diagnosis not present

## 2018-11-01 DIAGNOSIS — R110 Nausea: Secondary | ICD-10-CM | POA: Diagnosis not present

## 2018-11-01 DIAGNOSIS — R0902 Hypoxemia: Secondary | ICD-10-CM | POA: Diagnosis not present

## 2018-11-01 DIAGNOSIS — S3993XA Unspecified injury of pelvis, initial encounter: Secondary | ICD-10-CM | POA: Diagnosis not present

## 2018-11-01 LAB — COMPREHENSIVE METABOLIC PANEL
ALT: 11 U/L (ref 0–44)
AST: 20 U/L (ref 15–41)
Albumin: 3.4 g/dL — ABNORMAL LOW (ref 3.5–5.0)
Alkaline Phosphatase: 69 U/L (ref 38–126)
Anion gap: 8 (ref 5–15)
BUN: 16 mg/dL (ref 8–23)
CO2: 23 mmol/L (ref 22–32)
Calcium: 9.8 mg/dL (ref 8.9–10.3)
Chloride: 109 mmol/L (ref 98–111)
Creatinine, Ser: 0.83 mg/dL (ref 0.44–1.00)
GFR calc Af Amer: >60 mL/min (ref >60)
GFR calc non Af Amer: >60 mL/min (ref >60)
Glucose, Bld: 105 mg/dL — ABNORMAL HIGH (ref 70–99)
Potassium: 3.5 mmol/L (ref 3.5–5.1)
Sodium: 140 mmol/L (ref 135–145)
Total Bilirubin: 0.4 mg/dL (ref 0.3–1.2)
Total Protein: 7.1 g/dL (ref 6.5–8.1)

## 2018-11-01 LAB — URINALYSIS, COMPLETE (UACMP) WITH MICROSCOPIC
Bilirubin Urine: NEGATIVE
Glucose, UA: NEGATIVE mg/dL
Ketones, ur: NEGATIVE mg/dL
Nitrite: POSITIVE — AB
Protein, ur: NEGATIVE mg/dL
Specific Gravity, Urine: 1.019 (ref 1.005–1.030)
pH: 5 (ref 5.0–8.0)

## 2018-11-01 LAB — CBC WITH DIFFERENTIAL/PLATELET
Abs Immature Granulocytes: 0.06 10*3/uL (ref 0.00–0.07)
Basophils Absolute: 0.1 10*3/uL (ref 0.0–0.1)
Basophils Relative: 1 %
Eosinophils Absolute: 0.1 10*3/uL (ref 0.0–0.5)
Eosinophils Relative: 1 %
HCT: 32.3 % — ABNORMAL LOW (ref 36.0–46.0)
Hemoglobin: 10.3 g/dL — ABNORMAL LOW (ref 12.0–15.0)
Immature Granulocytes: 1 %
Lymphocytes Relative: 18 %
Lymphs Abs: 2.1 10*3/uL (ref 0.7–4.0)
MCH: 29.4 pg (ref 26.0–34.0)
MCHC: 31.9 g/dL (ref 30.0–36.0)
MCV: 92.3 fL (ref 80.0–100.0)
Monocytes Absolute: 0.9 10*3/uL (ref 0.1–1.0)
Monocytes Relative: 7 %
Neutro Abs: 8.6 10*3/uL — ABNORMAL HIGH (ref 1.7–7.7)
Neutrophils Relative %: 72 %
Platelets: 458 10*3/uL — ABNORMAL HIGH (ref 150–400)
RBC: 3.5 MIL/uL — ABNORMAL LOW (ref 3.87–5.11)
RDW: 14.2 % (ref 11.5–15.5)
WBC: 11.9 10*3/uL — ABNORMAL HIGH (ref 4.0–10.5)
nRBC: 0 % (ref 0.0–0.2)

## 2018-11-01 LAB — SARS CORONAVIRUS 2 BY RT PCR (HOSPITAL ORDER, PERFORMED IN ~~LOC~~ HOSPITAL LAB): SARS Coronavirus 2: NEGATIVE

## 2018-11-01 MED ORDER — MORPHINE SULFATE (PF) 4 MG/ML IV SOLN
4.0000 mg | INTRAVENOUS | Status: DC | PRN
  Administered 2018-11-01 – 2018-11-02 (×4): 4 mg via INTRAVENOUS
  Filled 2018-11-01 (×4): qty 1

## 2018-11-01 MED ORDER — OCUVITE-LUTEIN PO CAPS
1.0000 | ORAL_CAPSULE | Freq: Two times a day (BID) | ORAL | Status: DC
  Administered 2018-11-01 – 2018-11-05 (×8): 1 via ORAL
  Filled 2018-11-01 (×10): qty 1

## 2018-11-01 MED ORDER — SENNA 8.6 MG PO TABS
1.0000 | ORAL_TABLET | Freq: Two times a day (BID) | ORAL | Status: DC
  Administered 2018-11-02 – 2018-11-05 (×7): 8.6 mg via ORAL
  Filled 2018-11-01 (×8): qty 1

## 2018-11-01 MED ORDER — HYDROMORPHONE HCL 2 MG PO TABS
2.0000 mg | ORAL_TABLET | ORAL | Status: DC | PRN
  Filled 2018-11-01: qty 1

## 2018-11-01 MED ORDER — ENOXAPARIN SODIUM 40 MG/0.4ML ~~LOC~~ SOLN
40.0000 mg | SUBCUTANEOUS | Status: DC
  Administered 2018-11-01 – 2018-11-04 (×4): 40 mg via SUBCUTANEOUS
  Filled 2018-11-01 (×4): qty 0.4

## 2018-11-01 MED ORDER — DOCUSATE SODIUM 100 MG PO CAPS
100.0000 mg | ORAL_CAPSULE | Freq: Two times a day (BID) | ORAL | Status: DC
  Administered 2018-11-02 – 2018-11-05 (×7): 100 mg via ORAL
  Filled 2018-11-01 (×9): qty 1

## 2018-11-01 MED ORDER — PANTOPRAZOLE SODIUM 40 MG PO TBEC
40.0000 mg | DELAYED_RELEASE_TABLET | Freq: Every day | ORAL | Status: DC
  Administered 2018-11-01 – 2018-11-05 (×5): 40 mg via ORAL
  Filled 2018-11-01 (×6): qty 1

## 2018-11-01 MED ORDER — BISACODYL 10 MG RE SUPP: 10.0000 mg | Freq: Every day | RECTAL | Status: DC | PRN

## 2018-11-01 MED ORDER — ONDANSETRON HCL 4 MG PO TABS: 4.0000 mg | ORAL_TABLET | Freq: Four times a day (QID) | ORAL | Status: DC | PRN

## 2018-11-01 MED ORDER — LATANOPROST 0.005 % OP SOLN
1.0000 [drp] | Freq: Every day | OPHTHALMIC | Status: DC
  Administered 2018-11-01 – 2018-11-04 (×4): 1 [drp] via OPHTHALMIC
  Filled 2018-11-01: qty 2.5

## 2018-11-01 MED ORDER — SODIUM CHLORIDE 0.9 % IV SOLN
1.0000 g | Freq: Once | INTRAVENOUS | Status: AC
  Administered 2018-11-01: 15:00:00 1 g via INTRAVENOUS
  Filled 2018-11-01: qty 10

## 2018-11-01 MED ORDER — HYDROMORPHONE HCL 2 MG PO TABS
2.0000 mg | ORAL_TABLET | ORAL | Status: DC | PRN
  Administered 2018-11-01 – 2018-11-02 (×2): 2 mg via ORAL
  Filled 2018-11-01 (×2): qty 1

## 2018-11-01 MED ORDER — TIZANIDINE HCL 2 MG PO TABS
2.0000 mg | ORAL_TABLET | Freq: Every day | ORAL | Status: DC
  Administered 2018-11-01 – 2018-11-04 (×4): 2 mg via ORAL
  Filled 2018-11-01 (×5): qty 1

## 2018-11-01 MED ORDER — SODIUM CHLORIDE 0.9 % IV SOLN
INTRAVENOUS | Status: DC
  Administered 2018-11-01 – 2018-11-03 (×3): via INTRAVENOUS

## 2018-11-01 MED ORDER — ASPIRIN 81 MG PO CHEW
81.0000 mg | CHEWABLE_TABLET | Freq: Two times a day (BID) | ORAL | Status: DC
  Administered 2018-11-01 – 2018-11-05 (×8): 81 mg via ORAL
  Filled 2018-11-01 (×8): qty 1

## 2018-11-01 MED ORDER — MORPHINE SULFATE 15 MG PO TABS
15.0000 mg | ORAL_TABLET | Freq: Four times a day (QID) | ORAL | Status: DC
  Administered 2018-11-01 – 2018-11-05 (×15): 15 mg via ORAL
  Filled 2018-11-01 (×15): qty 1

## 2018-11-01 MED ORDER — SODIUM CHLORIDE 0.9 % IV SOLN
1.0000 g | INTRAVENOUS | Status: DC
  Administered 2018-11-02 – 2018-11-04 (×3): 1 g via INTRAVENOUS
  Filled 2018-11-01: qty 10
  Filled 2018-11-01 (×3): qty 1

## 2018-11-01 MED ORDER — ACETAMINOPHEN 325 MG PO TABS: 325.0000 mg | ORAL_TABLET | Freq: Four times a day (QID) | ORAL | Status: DC | PRN

## 2018-11-01 MED ORDER — ONDANSETRON HCL 4 MG/2ML IJ SOLN: 4.0000 mg | Freq: Once | INTRAMUSCULAR | Status: DC

## 2018-11-01 MED ORDER — POLYETHYLENE GLYCOL 3350 17 G PO PACK: 17.0000 g | PACK | Freq: Every day | ORAL | Status: DC | PRN

## 2018-11-01 NOTE — Care Management (Addendum)
Right total hip arthroplasty, on 10/04/18 under Dr. Dannette Barbara [phone].Message left for [place] with [facility]. Patient is from independent living at [facility] and may need SNF rehab. [facility] can accept her however I will need to confirm with Dr. Marlyn Corporal office since this is potentially an ortho bundle. Update at 1500: call received from [facility] with [facility] [phone] stating the this should have been a ortho bundle however they do not have her listed as so and therefore services were not set up at for patient. Rolling walker was delivered at time discharge. I explained to [facility] that ED would like to send patient to [facility]  [place] states that she will speak with Dr. Alphonzo Cruise however he's in  Surgery. She states she will call me back by the end of the day. Patient will need a follow up with Dr. Alphonzo Cruise. Since patient is already affiliated with [facility], and they can meet her needs, EDP updated that this is an option for transition. Patient shares that she has not received PT post operatively. Callback message received from [place] with [facility] stating that she has faxed PT RX to the correct number. RNCM returned Stacy's call [phone].

## 2018-11-01 NOTE — TOC Progression Note (Addendum)
Transition of Care [facility]) - Progression Note    Patient Details  Name: [name] MRN: [id number] Date of Birth: 1925/09/20  Transition of Care [facility]) CM/SW Contact  Tania Maynor Mwangi, LCSW Phone Number: 11/01/2018, 2:00 PM  Clinical Narrative:     CSW spoke to Geralyn Flash [phone]) the social worker at [employer] at [place]. She stated that the facilty would be able to provide PT services for the patient, and she will just need a PASSR and FL2.    PASSR #   [phone] A  5:04pm - Joelene Millin was informed that patient will be staying overnight per EDP. Joelene Millin understood. FL2 was sent to facility.   Expected Discharge Plan: [place] Barriers to Discharge: Continued Medical Work up  Expected Discharge Plan and Services Expected Discharge Plan: [place]   Discharge Planning Services: CM Consult Post Acute Care Choice: [place] Living arrangements for the past 2 months: [place]                                       Social Determinants of Health (SDOH) Interventions    Readmission Risk Interventions No flowsheet data found.

## 2018-11-01 NOTE — Evaluation (Signed)
Physical Therapy Evaluation Patient Details Name: [name] MRN: [id number] DOB: [birth year] Today's Date: 11/01/2018   History of Present Illness  85 y/o female who had a R total hip replacement 1 month ago, had done relatively well walking >100 ft POD1 and for ~1 week was walking long distances each day - then, without know injury stated having severe pain with WBing and has been limping and struggling with severe pain for the last few weeks.  Pt finally suffered a fall and came to the ED.  Clinical Impression  Pt eager to show what she can and can't do, but ultimately was very pain limited functionally.  She was able to tolerate some in-bed mobility but needed assist just to get LEs off EOB, was unable to bear weight on the R LE during standing and clearly was in a lot of pain with all tasks related to R LE acts.  She reports lateral thigh spasming as well as iliac crest pain, but again most pain is focused in lateral mid thigh (no swelling, bruising, know method of injury, etc).  Pt unable to manage at apartment and will clearly need assist and rehab to get back to functional independence.        Follow Up Recommendations SNF    Equipment Recommendations  None recommended by PT    Recommendations for Other Services       Precautions / Restrictions Precautions Precautions: Anterior Hip;Fall Restrictions Weight Bearing Restrictions: No Other Position/Activity Restrictions: Pt was WBAT post surgery 1 month ago, no new orders      Mobility  Bed Mobility Overal bed mobility: Needs Assistance Bed Mobility: Supine to Sit;Sit to Supine     Supine to sit: Mod assist Sit to supine: Mod assist   General bed mobility comments: Pt able to roll to side w/o direct assist, but needed assist with LEs getting off of and back onto bed  Transfers Overall transfer level: Needs assistance Equipment used: Rolling walker (2 wheeled) Transfers: Sit to/from Stand Sit to Stand: Min assist          General transfer comment: Pt leaning elbow on walker, very labored stooped effort, did not put weight through R LE during transition to standing.  Struggled to put even minimal weight through R LE (pain in thigh)  Ambulation/Gait             General Gait Details: unable to ambulate, struggled to heel-toe with WBing on L LE only minimally to the side  Stairs            Wheelchair Mobility    Modified Rankin (Stroke Patients Only)       Balance Overall balance assessment: Needs assistance   Sitting balance-Leahy Scale: Good       Standing balance-Leahy Scale: Fair Standing balance comment: heavily reliant on the walker, unable to put weight on R LE                             Pertinent Vitals/Pain Pain Assessment: 0-10 Pain Score: 8  Pain Location: lateral mid thigh (with some spasms), no groin pain, pain into lateral hip and iliac crest area    Home Living Family/patient expects to be discharged to:: Private residence Living Arrangements: Alone Available Help at Discharge: Personal care attendant;Available PRN/intermittently(pt has been alone during night, help during day) Type of Home: Apartment Home Access: Level entry       Home Equipment: Gilford Rile -  4 wheels;Cane - single point;Walker - 2 wheels      Prior Function Level of Independence: Needs assistance         Comments: Pt has generally been able to be relatively independent with mobility, etc in her apartment, recently has been very limited secondary to R hip/thigh pain, inability to bear weight      Hand Dominance        Extremity/Trunk Assessment   Upper Extremity Assessment Upper Extremity Assessment: Generalized weakness;Overall WFL for tasks assessed(age appropriate limitations)    Lower Extremity Assessment Lower Extremity Assessment: RLE deficits/detail RLE Deficits / Details: unable to do SLRs; guarded, lacking TKE LAQ, severe pain with hip ABd        Communication      Cognition Arousal/Alertness: Awake/alert Behavior During Therapy: WFL for tasks assessed/performed Overall Cognitive Status: Within Functional Limits for tasks assessed                                        General Comments      Exercises     Assessment/Plan    PT Assessment Patient needs continued PT services  PT Problem List Decreased strength;Decreased range of motion;Decreased activity tolerance;Decreased balance;Decreased mobility;Decreased coordination;Decreased knowledge of use of DME;Decreased safety awareness;Decreased knowledge of precautions;Pain       PT Treatment Interventions DME instruction;Gait training;Functional mobility training;Therapeutic activities;Therapeutic exercise;Balance training;Neuromuscular re-education;Patient/family education    PT Goals (Current goals can be found in the Care Plan section)  Acute Rehab PT Goals Patient Stated Goal: control pain PT Goal Formulation: With patient Time For Goal Achievement: 11/15/18 Potential to Achieve Goals: Fair    Frequency Min 2X/week   Barriers to discharge        Co-evaluation               AM-PAC PT "6 Clicks" Mobility  Outcome Measure Help needed turning from your back to your side while in a flat bed without using bedrails?: None Help needed moving from lying on your back to sitting on the side of a flat bed without using bedrails?: A Little Help needed moving to and from a bed to a chair (including a wheelchair)?: A Lot Help needed standing up from a chair using your arms (e.g., wheelchair or bedside chair)?: A Lot Help needed to walk in hospital room?: Total Help needed climbing 3-5 steps with a railing? : Total 6 Click Score: 13    End of Session Equipment Utilized During Treatment: Gait belt Activity Tolerance: Patient limited by pain;Patient tolerated treatment well Patient left: with call bell/phone within reach;in bed Nurse Communication:  Patient requests pain meds;Mobility status PT Visit Diagnosis: Muscle weakness (generalized) (M62.81);Difficulty in walking, not elsewhere classified (R26.2);Pain Pain - Right/Left: Right Pain - part of body: Hip    Time: 9509-3267 PT Time Calculation (min) (ACUTE ONLY): 32 min   Charges:   PT Evaluation $PT Eval Low Complexity: 1 Low PT Treatments $Therapeutic Activity: 8-22 mins        Kreg Shropshire, DPT 11/01/2018, 5:39 PM

## 2018-11-01 NOTE — H&P (Signed)
[place] [facility] [place] NAME: [name]    MR#:  [id number]  DATE OF BIRTH:  01-Feb-1926  DATE OF ADMISSION:  11/01/2018  PRIMARY CARE PHYSICIAN: Janith Lima, MD   REQUESTING/REFERRING PHYSICIAN: Merlyn Lot, MD  CHIEF COMPLAINT:   Chief Complaint  Patient presents with   Fall    HISTORY OF PRESENT ILLNESS:  85 y.o. female with pertinent past medical history of degenerative joint disease, GERD, OA, osteopenia, small bowel obstruction, COPD, bradycardia, and recent right total hip arthroplasty 10/04/2018 presenting to the ED from [facility] independent living with complaints of severe right hip pain post fall.  Patient reports that she recently had right hip replacement and has been doing well 1 week after the surgery.  She reports that after 1 week she has had right hip pain and muscle spasm causing weakness.  Patient states she was walking to the bathroom last night and her leg/hip just gave out and she fell.  She denies hitting her head or LOC she however states that she hit her right hip.  She is currently unable to bear weight or ambulate due to significant pain in her right hip she is also having recurrent muscle spasm was with movement or repositioning.  She was given 75 mcg of fentanyl and 4 g of Zofran by EMS.  On arrival to the ED, she was afebrile with blood pressure 156/53 mm Hg and pulse rate 67 beats/min. There were no focal neurological deficits; he was alert and oriented x4, and she did not demonstrate any memory deficits.  Initial labs revealed WBC 11.9, unremarkable CMP, urinalysis showed positive nitrites, small leukocytes and many bacteria.  X-ray of right hip showed no evidence of acute abnormality.  Chest x-ray showed no active cardiopulmonary disease.  CT pelvis shows no fracture or dislocation.  She has continued to complain of severe right hip pain following fall.  Patient will be admitted under hospitalist service  for further management.  PAST MEDICAL HISTORY:   Past Medical History:  Diagnosis Date   Adenomatous colon polyp 04/26/95   tubulovillous   Allergy    Arthritis    Bradycardia    Bursitis    Cataracts, bilateral    Constipation    COPD, moderate ([place])    "THIS WAS TOLD TO ME BACK IN THE DAYS WHEN EVERYONE THAT WAS  A SMOKER WAS TOLD THEY WERE A COPD. IM 90 AND I DONT GET SHORT OF BREATH , I DONT HAVE IT "   Cystitis    Degenerative joint disease    Depression    DENIES    Gastritis    GERD (gastroesophageal reflux disease)    Glaucoma    Hypoglycemia    Impaired memory    Insomnia    Lack of bladder control    Macular degeneration    OA (osteoarthritis)    Osteopenia    Renal cyst    right   Shortness of breath    Small bowel obstruction (HCC)    Trigeminal neuralgia    DENIES    Urinary incontinence    Wears dentures     PAST SURGICAL HISTORY:   Past Surgical History:  Procedure Laterality Date   ABDOMINAL HYSTERECTOMY  1992   nonmalignant reasons   BACK SURGERY  05/20/08   LUMBAR SPINAL FUSION    BREAST SURGERY     EXCISION OF CALCIUM DEPOSIT    CATARACT EXTRACTION Bilateral    CHOLECYSTECTOMY  [place]     EYE SURGERY     RIGHT EYE HEMORRHAGE SURGERY    JOINT REPLACEMENT  2005   LEFT HIP    PARTIAL COLECTOMY  04/26/95   tubulovillous adenoma   SHOULDER SURGERY  01/22/2009   left   TONSILLECTOMY     TOTAL HIP ARTHROPLASTY Right 10/04/2018   Procedure: TOTAL HIP ARTHROPLASTY ANTERIOR APPROACH;  Surgeon: Rod Can, MD;  Location: [facility];  Service: Orthopedics;  Laterality: Right;   TOTAL SHOULDER ARTHROPLASTY Right 07/05/2012   Procedure: RIGHT TOTAL SHOULDER ARTHROPLASTY;  Surgeon: Marin Shutter, MD;  Location: [place];  Service: Orthopedics;  Laterality: Right;  Right total shoulder arthroplasty    SOCIAL HISTORY:   Social History   Tobacco Use   Smoking status: Former  Smoker    Packs/day: 1.00    Years: 50.00    Pack years: 50.00    Types: Cigarettes    Quit date: 06/28/2005    Years since quitting: 13.3   Smokeless tobacco: Never Used  Substance Use Topics   Alcohol use: No    FAMILY HISTORY:   Family History  Problem Relation Age of Onset   Cancer Father        colon   Heart disease Sister    Colon polyps Brother    Diabetes Brother     DRUG ALLERGIES:   Allergies  Allergen Reactions   Codeine Itching and Nausea Only    Small amounts okay   Pregabalin Other (See Comments)    Confusion and hallucinations   Promethazine Hcl Other (See Comments)    Muscle cramps   Sulfonamide Derivatives Nausea Only    REVIEW OF SYSTEMS:   Review of Systems  Constitutional: Negative for chills, fever, malaise/fatigue and weight loss.  HENT: Negative for congestion, hearing loss and sore throat.   Eyes: Negative for blurred vision and double vision.  Respiratory: Negative for cough, shortness of breath and wheezing.   Cardiovascular: Negative for chest pain, palpitations, orthopnea and leg swelling.  Gastrointestinal: Negative for abdominal pain, diarrhea, nausea and vomiting.  Genitourinary: Negative for dysuria and urgency.  Musculoskeletal: Positive for falls and joint pain. Negative for myalgias.       Right leg muscle spasm  Skin: Negative for rash.  Neurological: Positive for weakness. Negative for dizziness, sensory change, speech change, focal weakness and headaches.  Psychiatric/Behavioral: Negative for depression.   MEDICATIONS AT HOME:   Prior to Admission medications   Medication Sig Start Date End Date Taking? Authorizing Provider  acetaminophen (TYLENOL) 325 MG tablet Take 1-2 tablets (325-650 mg total) by mouth every 6 (six) hours as needed for mild pain (pain score 1-3 or temp > 100.5). 10/05/18  Yes Swinteck, Aaron Edelman, MD  aspirin 81 MG chewable tablet Chew 1 tablet (81 mg total) by mouth 2 (two) times daily. 10/05/18  Yes  Swinteck, Aaron Edelman, MD  beta carotene w/minerals (OCUVITE) tablet Take 1 tablet by mouth 2 (two) times daily.   Yes [provider]  bisacodyl (DULCOLAX) 5 MG EC tablet Take 5 mg by mouth daily as needed for moderate constipation.   Yes [provider]  HYDROmorphone (DILAUDID) 2 MG tablet Take 2 mg by mouth 4 (four) times daily as needed for pain.   Yes [provider]  omeprazole (PRILOSEC) 20 MG capsule Take 20 mg by mouth daily before breakfast.    Yes [provider]  ondansetron (ZOFRAN) 4 MG tablet Take 1 tablet (  4 mg total) by mouth every 6 (six) hours as needed for nausea. 10/05/18  Yes Swinteck, Aaron Edelman, MD  polyethylene glycol (MIRALAX / GLYCOLAX) 17 g packet Take 17 g by mouth daily as needed for moderate constipation.   Yes [provider]  senna (SENOKOT) 8.6 MG TABS tablet Take 1 tablet (8.6 mg total) by mouth 2 (two) times daily. 10/05/18  Yes Swinteck, Aaron Edelman, MD  Travoprost, BAK Free, (TRAVATAN Z) 0.004 % SOLN ophthalmic solution Place 1 drop into both eyes at bedtime.  10/24/16  Yes [provider]  bisacodyl (DULCOLAX) 10 MG suppository Place 10 mg rectally daily as needed for moderate constipation.    [provider]  docusate sodium (COLACE) 100 MG capsule Take 1 capsule (100 mg total) by mouth 2 (two) times daily. 10/05/18   Swinteck, Aaron Edelman, MD  morphine (MSIR) 15 MG tablet Take 15 mg by mouth 4 (four) times daily. 10/25/18   [provider]    VITAL SIGNS:  Blood pressure 129/84, pulse 73, temperature 99 F (37.2 C), temperature source Oral, resp. rate 18, height 5\' 2"  (1.575 m), weight 56.7 kg, SpO2 100 %.  PHYSICAL EXAMINATION:   Physical Exam  GENERAL:  85 y.o.-year-old patient lying in the bed moderate amount of pain.  EYES: Pupils equal, round, reactive to light and accommodation. No scleral icterus. Extraocular muscles intact.  HEENT: Head atraumatic, normocephalic. Oropharynx and nasopharynx clear.    NECK:  Supple, no jugular venous distention. No thyroid enlargement, no tenderness.  LUNGS: Normal breath sounds bilaterally, no wheezing, rales,rhonchi or crepitation. No use of accessory muscles of respiration.  CARDIOVASCULAR: S1, S2 normal. No murmurs, rubs, or gallops.  ABDOMEN: Soft, nontender, nondistended. Bowel sounds present. No organomegaly or mass.  EXTREMITIES: No pedal edema, cyanosis, or clubbing. No rash or lesions. + pedal pulses MUSCULOSKELETAL: Normal bulk, and power was 5+ grip and elbow, knee, and ankle flexion and extension bilaterally on the left.  Decreased range of motion on the right.  Unable to assess due to significant amount of pain in the right lower extremity NEUROLOGIC:Alert and oriented x 3. CN 2-12 intact. Sensation to light touch and cold stimuli intact bilaterally. Finger to nose nl. DTR's (biceps, patellar, and achilles) 1+ and symmetric throughout. Gait not tested due to safety concern. PSYCHIATRIC: The patient is alert and oriented x 3.  SKIN: No obvious rash, lesion, or ulcer.   DATA REVIEWED:  LABORATORY PANEL:   CBC Recent Labs  Lab 11/01/18 1133  WBC 11.9*  HGB 10.3*  HCT 32.3*  PLT 458*   ------------------------------------------------------------------------------------------------------------------  Chemistries  Recent Labs  Lab 11/01/18 1133  NA 140  K 3.5  CL 109  CO2 23  GLUCOSE 105*  BUN 16  CREATININE 0.83  CALCIUM 9.8  AST 20  ALT 11  ALKPHOS 69  BILITOT 0.4   ------------------------------------------------------------------------------------------------------------------  Cardiac Enzymes No results for input(s): TROPONINI in the last 168 hours. ------------------------------------------------------------------------------------------------------------------  RADIOLOGY:  Dg Chest 1 View  Result Date: 11/01/2018 CLINICAL DATA:  pt recently had a scheduled right hip replacement about a month ago. Pt states she  was walking to bathroom last night and her leg/hip just gave out and she fell. Pt denies any LOC. Pt hit right hip. EXAM: CHEST  1 VIEW COMPARISON:  06/28/2012 and earlier FINDINGS: Heart size is normal. There is atherosclerotic calcification of the thoracic aorta. The lungs are clear. Probable skin fold overlies the RIGHT LATERAL chest. No acute fractures. Remote bilateral shoulder arthroplasty. IMPRESSION: 1.  Probable skin fold overlying the RIGHT LATERAL chest. There has been injury to the chest, consider decubitus or erect views to exclude pneumothorax. 2. No evidence for acute cardiopulmonary disease. Electronically Signed   By: Nolon Nations M.D.   On: 11/01/2018 12:41   Dg Chest 2 View  Result Date: 11/01/2018 CLINICAL DATA:  Fall. EXAM: CHEST - 2 VIEW COMPARISON:  Radiographs of November 01, 2018. FINDINGS: The heart size and mediastinal contours are within normal limits. Both lungs are clear. The visualized skeletal structures are unremarkable. IMPRESSION: No active cardiopulmonary disease. Electronically Signed   By: Marijo Conception M.D.   On: 11/01/2018 13:32   Dg Knee 2 Views Right  Result Date: 11/01/2018 CLINICAL DATA:  female that presents to the ED due to fall. Patient lives at [facility] at [place]. Patient states that last night she tried to go to the bathroom and her "right leg gave way." Patient had a hip surgery last month, and did not receive any physical therapy. Increased pain in the RIGHT knee since the fall. History of osteopenia, osteoarthritis. EXAM: RIGHT KNEE - 1-2 VIEW COMPARISON:  None. FINDINGS: There is no acute fracture or subluxation. There is mild narrowing of the LATERAL compartment. No joint effusion. IMPRESSION: No evidence for acute abnormality. Electronically Signed   By: Nolon Nations M.D.   On: 11/01/2018 17:24   Ct Pelvis Wo Contrast  Result Date: 11/01/2018 CLINICAL DATA:  Severe right hip pain following a fall. Previous right hip replacement. EXAM: CT  PELVIS WITHOUT CONTRAST TECHNIQUE: Multidetector CT imaging of the pelvis was performed following the standard protocol without intravenous contrast. COMPARISON:  Right hip radiographs obtained earlier today. Small sq FINDINGS: Urinary Tract:  No abnormality visualized. Bowel:  Unremarkable visualized pelvic bowel loops. Vascular/Lymphatic: Atheromatous arterial calcifications without aneurysm. No enlarged lymph nodes. Reproductive:  Surgically absent uterus. No adnexal masses. Other:  None. Musculoskeletal: Lower lumbar spine degenerative changes and fixation hardware. Bilateral hip prostheses. No fracture or dislocation seen. IMPRESSION: 1. No fracture or dislocation. 2. Bilateral hip prostheses. 3. Lower lumbar spine degenerative changes and fixation hardware. Electronically Signed   By: Claudie Revering M.D.   On: 11/01/2018 14:45   Dg Hip Unilat W Or Wo Pelvis 2-3 Views Right  Result Date: 11/01/2018 CLINICAL DATA:  RIGHT hip pain. EXAM: DG HIP (WITH OR WITHOUT PELVIS) 2-3V RIGHT COMPARISON:  None. FINDINGS: Bilateral total hip arthroplasties noted. No acute fracture, dislocation or definite complicating features noted. No suspicious focal bony lesions are present. Surgical changes in the LOWER lumbar spine are present. IMPRESSION: 1. No evidence of acute abnormality 2. Bilateral hip arthroplasties without definite complicating features. Electronically Signed   By: Margarette Canada M.D.   On: 11/01/2018 12:34    EKG:  EKG: normal EKG, normal sinus rhythm, unchanged from previous tracings.  IMPRESSION AND PLAN:   85 y.o. female with pertinent past medical history of degenerative joint disease, GERD, OA, osteopenia, small bowel obstruction, COPD, bradycardia, and recent right total hip arthroplasty 10/04/2018 presenting to the ED from [facility] independent living with complaints of severe right hip pain post fall.  1. Severe right hip pain -patient with recent right total hip arthroplasty 10/04/2018, pain now  worse with fall. - Admit to MedSurg unit - Imaging reviewed and shows no acute fracture of right hip - PRN p.o. and IV pain management - Tizanidine for muscle spasm - Orthopedic consult for evaluation - PT/OT eval  2. Fall -likely due to weakness in a patient  with recent hip surgery and now with UTI -Falls precautions initiated -PT/OT eval as above  3. UTI -UA positive for UTI -Urine cultures pending -Start ceftriaxone  4. Chronic COPD -no evidence of exacerbation -Chest x-ray shows no active cardiopulmonary disease -DuoNebs PRN  5.  GERD -Protonix  6. DVT prophylaxis - Enoxaparin SubQ    All the records are reviewed and case discussed with ED provider. Management plans discussed with the patient, family and they are in agreement.  CODE STATUS: FULL  TOTAL TIME TAKING CARE OF THIS PATIENT: 50 minutes.    on 11/01/2018 at 6:41 PM  Rufina Falco, DNP, FNP-BC [facility] Nurse Practitioner Between 7am to 6pm - Pager [phone]  After 6pm go to [email or web address] - password [email or web address] [facility]  Office  [phone]  CC: Primary care physician; Janith Lima, MD

## 2018-11-01 NOTE — ED Notes (Signed)
PT at bedside at this time 

## 2018-11-01 NOTE — TOC Initial Note (Addendum)
Transition of Care [facility]) - Initial/Assessment Note    Patient Details  Name: [facility] [place] CARMACK MRN: [id number] Date of Birth: [birth year]  Transition of Care [facility]) CM/SW Contact:    Fredric Mare, LCSW Phone Number: 11/01/2018, 1:46 PM  Clinical Narrative:                  Patient is a 85 year old female that presents to the ED due to fall. Patient lives at [facility] [place]. Patient states that last night she tried to go to the bathroom and her "right leg gave way." Patient had a hip surgery last month, and did not receive any PT services at the time. Patient states that she was doing her exercises, and was supposed to have PT see her today at the facility. Patient shared that [facility] has a SNF, and she would like to receive services there. CSW will call to confirm this.    Expected Discharge Plan: Skilled Nursing Facility Barriers to Discharge: Continued Medical Work up   Patient Goals and CMS Choice Patient states their goals for this hospitalization and ongoing recovery are:: "To get better"      Expected Discharge Plan and Services Expected Discharge Plan: [place]   Discharge Planning Services: CM Consult Post Acute Care Choice: [place] Living arrangements for the past 2 months: [place]                                      Prior Living Arrangements/Services Living arrangements for the past 2 months: [place] Lives with:: Facility Resident Patient language and need for interpreter reviewed:: Yes Do you feel safe going back to the place where you live?: Yes      Need for Family Participation in Patient Care: No (Comment) Care giver support system in place?: No (comment) Current home services: DME(patient has rolling walker, straight leg walker, bedside commode, and a rollator) Criminal Activity/Legal Involvement Pertinent to Current Situation/Hospitalization: No -  Comment as needed  Activities of Daily Living      Permission Sought/Granted Permission sought to share information with : Facility Sport and exercise psychologist, Family Supports Permission granted to share information with : Yes, Verbal Permission Granted  Share Information with NAME: [name]     Permission granted to share info w Relationship: Sister  Permission granted to share info w Contact Information: [phone]  Emotional Assessment Appearance:: Appears stated age Attitude/Demeanor/Rapport: Engaged, Self-Confident Affect (typically observed): Appropriate, Accepting, Calm Orientation: : Oriented to Self, Oriented to Place, Oriented to  Time, Oriented to Situation Alcohol / Substance Use: Tobacco Use(former smoker) Psych Involvement: No (comment)  Admission diagnosis:  ems/fall Patient Active Problem List   Diagnosis Date Noted  . Osteoarthritis of right hip 10/04/2018  . TSH elevation 09/24/2018  . Pre-operative clearance 09/24/2018  . Frequent UTI 11/09/2017  . Leukocytosis 02/06/2016  . Hyperlipidemia with target LDL less than 160 09/24/2014  . Routine general medical examination at a health care facility 09/24/2014  . MACULAR DEGENERATION 08/07/2008  . Glaucoma 08/07/2008  . Insomnia 08/07/2008  . Constipation 07/22/2008  . NEURALGIA, TRIGEMINAL 01/07/2008  . Allergic rhinitis 09/03/2007  . COPD 09/03/2007  . GERD 09/03/2007  . Osteoarthritis 09/03/2007   PCP:  Janith Lima, MD Pharmacy:   [place], [place] - [address] [place] [address] Phone: [phone] Fax: [phone]  [facility] - [place], [place] [address] [place] [address] Phone: [phone] Fax: [phone]     Social Determinants of Health (SDOH) Interventions    Readmission Risk Interventions No flowsheet data found.

## 2018-11-01 NOTE — ED Notes (Signed)
Pt cleaned up and repositioned in bed at this time. External cath placed and pt resting comfortably in bed

## 2018-11-01 NOTE — ED Provider Notes (Signed)
[facility] Emergency Department Provider Note    First MD Initiated Contact with Patient 11/01/18 1156     (approximate)  I have reviewed the triage vital signs and the nursing notes.   HISTORY  Chief Complaint Fall    HPI [name] is a 85 y.o. female presents from home after mechanical fall occurring last night while she was getting up from bathroom.  States that she tripped and fell over her walker.  Did not hit her head or neck.  Landed on her right hip which is status post recent hip arthroplasty.  She denies any fevers.  States that she is having severe muscle spasms.  No numbness or tingling.  Denies any abdominal pain.  She is unable to place any weight on her right leg.  Patient states that also 1 week ago they discontinued her home morphine.  States she does still take Dilaudid orally.    Past Medical History:  Diagnosis Date  . Adenomatous colon polyp 04/26/95   tubulovillous  . Allergy   . Arthritis   . Bradycardia   . Bursitis   . Cataracts, bilateral   . Constipation   . COPD, moderate ([place])    "THIS WAS TOLD TO ME BACK IN THE DAYS WHEN EVERYONE THAT WAS  A SMOKER WAS TOLD THEY WERE A COPD. IM 90 AND I DONT GET SHORT OF BREATH , I DONT HAVE IT "  . Cystitis   . Degenerative joint disease   . Depression    DENIES   . Gastritis   . GERD (gastroesophageal reflux disease)   . Glaucoma   . Hypoglycemia   . Impaired memory   . Insomnia   . Lack of bladder control   . Macular degeneration   . OA (osteoarthritis)   . Osteopenia   . Renal cyst    right  . Shortness of breath   . Small bowel obstruction ([place])   . Trigeminal neuralgia    DENIES   . Urinary incontinence   . Wears dentures    Family History  Problem Relation Age of Onset  . Cancer Father        colon  . Heart disease Sister   . Colon polyps Brother   . Diabetes Brother    Past Surgical History:  Procedure Laterality Date  . ABDOMINAL HYSTERECTOMY  1992    nonmalignant reasons  . BACK SURGERY  05/20/08   LUMBAR SPINAL FUSION   . BREAST SURGERY     EXCISION OF CALCIUM DEPOSIT   . CATARACT EXTRACTION Bilateral   . CHOLECYSTECTOMY  1994  . DILATION AND CURETTAGE OF UTERUS    . EYE SURGERY     RIGHT EYE HEMORRHAGE SURGERY   . JOINT REPLACEMENT  2005   LEFT HIP   . PARTIAL COLECTOMY  04/26/95   tubulovillous adenoma  . SHOULDER SURGERY  01/22/2009   left  . TONSILLECTOMY    . TOTAL HIP ARTHROPLASTY Right 10/04/2018   Procedure: TOTAL HIP ARTHROPLASTY ANTERIOR APPROACH;  Surgeon: Rod Can, MD;  Location: [facility];  Service: Orthopedics;  Laterality: Right;  . TOTAL SHOULDER ARTHROPLASTY Right 07/05/2012   Procedure: RIGHT TOTAL SHOULDER ARTHROPLASTY;  Surgeon: Marin Shutter, MD;  Location: [facility];  Service: Orthopedics;  Laterality: Right;  Right total shoulder arthroplasty   Patient Active Problem List   Diagnosis Date Noted  . UTI (urinary tract infection) 11/01/2018  . Osteoarthritis of right hip 10/04/2018  . TSH  elevation 09/24/2018  . Pre-operative clearance 09/24/2018  . Frequent UTI 11/09/2017  . Leukocytosis 02/06/2016  . Hyperlipidemia with target LDL less than 160 09/24/2014  . Routine general medical examination at a health care facility 09/24/2014  . MACULAR DEGENERATION 08/07/2008  . Glaucoma 08/07/2008  . Insomnia 08/07/2008  . Constipation 07/22/2008  . NEURALGIA, TRIGEMINAL 01/07/2008  . Allergic rhinitis 09/03/2007  . COPD 09/03/2007  . GERD 09/03/2007  . Osteoarthritis 09/03/2007      Prior to Admission medications   Medication Sig Start Date End Date Taking? Authorizing Provider  acetaminophen (TYLENOL) 325 MG tablet Take 1-2 tablets (325-650 mg total) by mouth every 6 (six) hours as needed for mild pain (pain score 1-3 or temp > 100.5). 10/05/18  Yes Swinteck, Aaron Edelman, MD  aspirin 81 MG chewable tablet Chew 1 tablet (81 mg total) by mouth 2 (two) times daily. 10/05/18  Yes Swinteck, Aaron Edelman, MD  beta carotene  w/minerals (OCUVITE) tablet Take 1 tablet by mouth 2 (two) times daily.   Yes [provider]  bisacodyl (DULCOLAX) 5 MG EC tablet Take 5 mg by mouth daily as needed for moderate constipation.   Yes [provider]  HYDROmorphone (DILAUDID) 2 MG tablet Take 2 mg by mouth 4 (four) times daily as needed for pain.   Yes [provider]  omeprazole (PRILOSEC) 20 MG capsule Take 20 mg by mouth daily before breakfast.    Yes [provider]  ondansetron (ZOFRAN) 4 MG tablet Take 1 tablet (4 mg total) by mouth every 6 (six) hours as needed for nausea. 10/05/18  Yes Swinteck, Aaron Edelman, MD  polyethylene glycol (MIRALAX / GLYCOLAX) 17 g packet Take 17 g by mouth daily as needed for moderate constipation.   Yes [provider]  senna (SENOKOT) 8.6 MG TABS tablet Take 1 tablet (8.6 mg total) by mouth 2 (two) times daily. 10/05/18  Yes Swinteck, Aaron Edelman, MD  Travoprost, BAK Free, (TRAVATAN Z) 0.004 % SOLN ophthalmic solution Place 1 drop into both eyes at bedtime.  10/24/16  Yes [provider]  bisacodyl (DULCOLAX) 10 MG suppository Place 10 mg rectally daily as needed for moderate constipation.    [provider]  docusate sodium (COLACE) 100 MG capsule Take 1 capsule (100 mg total) by mouth 2 (two) times daily. 10/05/18   Swinteck, Aaron Edelman, MD  morphine (MSIR) 15 MG tablet Take 15 mg by mouth 4 (four) times daily. 10/25/18   [provider]    Allergies Codeine, Pregabalin, Promethazine hcl, and Sulfonamide derivatives    Social History Social History   Tobacco Use  . Smoking status: Former Smoker    Packs/day: 1.00    Years: 50.00    Pack years: 50.00    Types: Cigarettes    Quit date: 06/28/2005    Years since quitting: 13.3  . Smokeless tobacco: Never Used  Substance Use Topics  . Alcohol use: No  . Drug use: No    Review of Systems Patient denies headaches, rhinorrhea, blurry vision, numbness, shortness of breath, chest pain,  edema, cough, abdominal pain, nausea, vomiting, diarrhea, dysuria, fevers, rashes or hallucinations unless otherwise stated above in HPI. ____________________________________________   PHYSICAL EXAM:  VITAL SIGNS: Vitals:   11/01/18 1815 11/01/18 1900  BP:  (!) 150/56  Pulse: 73 70  Resp:  18  Temp:    SpO2: 100% 99%    Constitutional: Alert and oriented.  Eyes: Conjunctivae are normal.  Head: Atraumatic. Nose: No congestion/rhinnorhea. Mouth/Throat: Mucous membranes are moist.  Neck: No stridor. Painless ROM.  Cardiovascular: Normal rate, regular rhythm. Grossly normal heart sounds.  Good peripheral circulation. Respiratory: Normal respiratory effort.  No retractions. Lungs CTAB. Gastrointestinal: Soft and nontender. No distention. No abdominal bruits. No CVA tenderness. Genitourinary:  Musculoskeletal: There is palpation over the greater trochanter.  No overlying warmth.  Surgical incision appears clean dry and intact.  Pain with range of motion.  Neurovascular intact distally.  Edema.  No joint effusions. Neurologic:  Normal speech and language. No gross focal neurologic deficits are appreciated. No facial droop Skin:  Skin is warm, dry and intact. No rash noted. Psychiatric: Mood and affect are normal. Speech and behavior are normal.  ____________________________________________   LABS (all labs ordered are listed, but only abnormal results are displayed)  Results for orders placed or performed during the hospital encounter of 11/01/18 (from the past 24 hour(s))  CBC with Differential/Platelet     Status: Abnormal   Collection Time: 11/01/18 11:33 AM  Result Value Ref Range   WBC 11.9 (H) 4.0 - 10.5 K/uL   RBC 3.50 (L) 3.87 - 5.11 MIL/uL   Hemoglobin 10.3 (L) 12.0 - 15.0 g/dL   HCT 32.3 (L) 36.0 - 46.0 %   MCV 92.3 80.0 - 100.0 fL   MCH 29.4 26.0 - 34.0 pg   MCHC 31.9 30.0 - 36.0 g/dL   RDW 14.2 11.5 - 15.5 %   Platelets 458 (H) 150 - 400 K/uL   nRBC 0.0 0.0 -  0.2 %   Neutrophils Relative % 72 %   Neutro Abs 8.6 (H) 1.7 - 7.7 K/uL   Lymphocytes Relative 18 %   Lymphs Abs 2.1 0.7 - 4.0 K/uL   Monocytes Relative 7 %   Monocytes Absolute 0.9 0.1 - 1.0 K/uL   Eosinophils Relative 1 %   Eosinophils Absolute 0.1 0.0 - 0.5 K/uL   Basophils Relative 1 %   Basophils Absolute 0.1 0.0 - 0.1 K/uL   Immature Granulocytes 1 %   Abs Immature Granulocytes 0.06 0.00 - 0.07 K/uL  Comprehensive metabolic panel     Status: Abnormal   Collection Time: 11/01/18 11:33 AM  Result Value Ref Range   Sodium 140 135 - 145 mmol/L   Potassium 3.5 3.5 - 5.1 mmol/L   Chloride 109 98 - 111 mmol/L   CO2 23 22 - 32 mmol/L   Glucose, Bld 105 (H) 70 - 99 mg/dL   BUN 16 8 - 23 mg/dL   Creatinine, Ser 0.83 0.44 - 1.00 mg/dL   Calcium 9.8 8.9 - 10.3 mg/dL   Total Protein 7.1 6.5 - 8.1 g/dL   Albumin 3.4 (L) 3.5 - 5.0 g/dL   AST 20 15 - 41 U/L   ALT 11 0 - 44 U/L   Alkaline Phosphatase 69 38 - 126 U/L   Total Bilirubin 0.4 0.3 - 1.2 mg/dL   GFR calc non Af Amer >60 >60 mL/min   GFR calc Af Amer >60 >60 mL/min   Anion gap 8 5 - 15  Urinalysis, Complete w Microscopic     Status: Abnormal   Collection Time: 11/01/18  2:01 PM  Result Value Ref Range   Color, Urine YELLOW (A) YELLOW   APPearance HAZY (A) CLEAR   Specific Gravity, Urine 1.019 1.005 - 1.030   pH 5.0 5.0 - 8.0   Glucose, UA NEGATIVE NEGATIVE mg/dL   Hgb urine dipstick SMALL (A) NEGATIVE   Bilirubin Urine NEGATIVE NEGATIVE   Ketones, ur NEGATIVE NEGATIVE mg/dL  Protein, ur NEGATIVE NEGATIVE mg/dL   Nitrite POSITIVE (A) NEGATIVE   Leukocytes,Ua SMALL (A) NEGATIVE   RBC / HPF 0-5 0 - 5 RBC/hpf   WBC, UA 21-50 0 - 5 WBC/hpf   Bacteria, UA MANY (A) NONE SEEN   Squamous Epithelial / LPF 0-5 0 - 5   Mucus PRESENT    Ca Oxalate Crys, UA PRESENT   SARS Coronavirus 2 (CEPHEID - Performed in [place] hospital lab), Hosp Order     Status: None   Collection Time: 11/01/18  5:25 PM   Specimen: Nasopharyngeal  Swab  Result Value Ref Range   SARS Coronavirus 2 NEGATIVE NEGATIVE   ____________________________________________  ____________________________________________  RADIOLOGY  I personally reviewed all radiographic images ordered to evaluate for the above acute complaints and reviewed radiology reports and findings.  These findings were personally discussed with the patient.  Please see medical record for radiology report.  ____________________________________________   PROCEDURES  Procedure(s) performed:  Procedures    Critical Care performed: no ____________________________________________   INITIAL IMPRESSION / ASSESSMENT AND PLAN / ED COURSE  Pertinent labs & imaging results that were available during my care of the patient were reviewed by me and considered in my medical decision making (see chart for details).   UKG:URKYHCWC, dislocation, contusion, hematoma, gluteal compartment syndrome  FAJR FIFE is a 85 y.o. who presents to the ED with right hip pain after mechanical fall as described above.  Blood work and imaging will be sent for the by differential.  The patient will be placed on continuous pulse oximetry and telemetry for monitoring.  Laboratory evaluation will be sent to evaluate for the above complaints.     Clinical Course as of Nov 01 2006  Thu Nov 01, 2018  1338 Right hip and pelvis films are equivocal.  Will order CT imaging to further evaluate if she is having severe pain.  Presentation and exam somewhat complicated by her extensive opioid dependence with recent stopping her morphine at home may be exacerbating her symptoms.  White count downtrending from previous.  Hemoglobin improved.   [PR]  3762 Analysis with nitrite positive UTI.  Has had a history of E. coli infections but no evidence of ESBL.  Will give Rocephin.  CT imaging does not show any evidence of fracture or displacement.   [PR]  1500 Patient reassessed.  Repeat exam and she is now  pain-free.  Compartments are soft.  Doubt compartment syndrome. Able to range hip at this time.  Does not seem consistent with septic arthritis or bursitis particularly given lack of effusion or overlying erythema.  Likely muscle spasms.   [PR]  8315 Discussed with case management and patient is coming from [place] where they have rehab.  She is not showing any signs of sepsis.  On further history taking it appears that she was discharged home with video for exercises and was supposed to have home therapy but the order was faxed to the wrong address therefore she is been over 1 month without appropriate physical therapy post right hip arthroplasty.  She is still having pretty significant right hip pain I think she is appropriate for discharge to increased level of care with rehab.   [PR]  1640 Unable to bear weight with physical therapy.   [PR]  1645 Discussed case in consultation with the patient's orthopedic surgeon Dr. Gretel Acre.  If CT negative would not recommend MRI.  Patient with severe persistent pain and unable to bear weight.  Will discuss  with hospitalist for observation for pain control as well as IV antibiotics and inpatient physical therapy.   [PR]    Clinical Course User Index [PR] Merlyn Lot, MD    The patient was evaluated in Emergency Department today for the symptoms described in the history of present illness. He/she was evaluated in the context of the global COVID-19 pandemic, which necessitated consideration that the patient might be at risk for infection with the SARS-CoV-2 virus that causes COVID-19. Institutional protocols and algorithms that pertain to the evaluation of patients at risk for COVID-19 are in a state of rapid change based on information released by regulatory bodies including the CDC and federal and state organizations. These policies and algorithms were followed during the patient's care in the ED.  As part of my medical decision making, I reviewed the  following data within the [facility] notes reviewed and incorporated, Labs reviewed, notes from prior ED visits and [place] Controlled Substance Database   ____________________________________________   FINAL CLINICAL IMPRESSION(S) / ED DIAGNOSES  Final diagnoses:  Right hip pain  Acute cystitis without hematuria  Inability to bear weight      NEW MEDICATIONS STARTED DURING THIS VISIT:  New Prescriptions   No medications on file     Note:  This document was prepared using Dragon voice recognition software and may include unintentional dictation errors.    Merlyn Lot, MD 11/01/18 2009

## 2018-11-01 NOTE — ED Triage Notes (Addendum)
Pt come vi [facility] from [place] living with c/o fall. EMS reports pt recently had a scheduled right hip replacement about a month ago. Pt states she was walking to bathroom last night and her leg/hip just gave out and she fell. Pt denies any LOC. Pt hit right hip. Pt unable to bear weight or ambulate at this time. Pt states she has been having muscle spasms as well.  EMS gave 39mcg of fent and 4 of zofran. Pt reports pain 4/10 at this time. VSS  Pt is alert and oriented. Pt denies any dizziness, SOB or chest pain.

## 2018-11-01 NOTE — ED Notes (Signed)
ED TO INPATIENT HANDOFF REPORT  ED Nurse Name Sam and Phone #: [phone]  S Name/Age/Gender [name] 85 y.o. female Room/Bed: ED13A/ED13A  Code Status   Code Status: Prior  Home/SNF/Other Skilled nursing facility Patient oriented to: self, place, time and situation Is this baseline? Yes   Triage Complete: Triage complete  Chief Complaint ems/fall  Triage Note Pt come vi [facility] from [facility] living with c/o fall. EMS reports pt recently had a scheduled right hip replacement about a month ago. Pt states she was walking to bathroom last night and her leg/hip just gave out and she fell. Pt denies any LOC. Pt hit right hip. Pt unable to bear weight or ambulate at this time. Pt states she has been having muscle spasms as well.  EMS gave 83mcg of fent and 4 of zofran. Pt reports pain 4/10 at this time. VSS  Pt is alert and oriented. Pt denies any dizziness, SOB or chest pain.   Allergies Allergies  Allergen Reactions  . Codeine Itching and Nausea Only    Small amounts okay  . Pregabalin Other (See Comments)    Confusion and hallucinations  . Promethazine Hcl Other (See Comments)    Muscle cramps  . Sulfonamide Derivatives Nausea Only    Level of Care/Admitting Diagnosis ED Disposition    ED Disposition Condition [facility] Area: [place] [100120]  Level of Care: Med-Surg [16]  Covid Evaluation: Person Under Investigation (PUI)  Diagnosis: UTI (urinary tract infection) [563149]  Admitting Physician: Lang Snow [phone]  Attending Physician: Rufina Falco ACHIENG [[id number]]  PT Class (Do Not Modify): Observation [104]  PT Acc Code (Do Not Modify): Observation [10022]       B Medical/Surgery History Past Medical History:  Diagnosis Date  . Adenomatous colon polyp 04/26/95   tubulovillous  . Allergy   . Arthritis   . Bradycardia   . Bursitis   . Cataracts, bilateral   . Constipation   . COPD,  moderate ([place])    "THIS WAS TOLD TO ME BACK IN THE DAYS WHEN EVERYONE THAT WAS  A SMOKER WAS TOLD THEY WERE A COPD. IM 90 AND I DONT GET SHORT OF BREATH , I DONT HAVE IT "  . Cystitis   . Degenerative joint disease   . Depression    DENIES   . Gastritis   . GERD (gastroesophageal reflux disease)   . Glaucoma   . Hypoglycemia   . Impaired memory   . Insomnia   . Lack of bladder control   . Macular degeneration   . OA (osteoarthritis)   . Osteopenia   . Renal cyst    right  . Shortness of breath   . Small bowel obstruction ([place])   . Trigeminal neuralgia    DENIES   . Urinary incontinence   . Wears dentures    Past Surgical History:  Procedure Laterality Date  . ABDOMINAL HYSTERECTOMY  1992   nonmalignant reasons  . BACK SURGERY  05/20/08   LUMBAR SPINAL FUSION   . BREAST SURGERY     EXCISION OF CALCIUM DEPOSIT   . CATARACT EXTRACTION Bilateral   . CHOLECYSTECTOMY  1994  . DILATION AND CURETTAGE OF UTERUS    . EYE SURGERY     RIGHT EYE HEMORRHAGE SURGERY   . JOINT REPLACEMENT  2005   LEFT HIP   . PARTIAL COLECTOMY  04/26/95   tubulovillous adenoma  . SHOULDER SURGERY  01/22/2009  left  . TONSILLECTOMY    . TOTAL HIP ARTHROPLASTY Right 10/04/2018   Procedure: TOTAL HIP ARTHROPLASTY ANTERIOR APPROACH;  Surgeon: Rod Can, MD;  Location: [facility];  Service: Orthopedics;  Laterality: Right;  . TOTAL SHOULDER ARTHROPLASTY Right 07/05/2012   Procedure: RIGHT TOTAL SHOULDER ARTHROPLASTY;  Surgeon: Marin Shutter, MD;  Location: [facility];  Service: Orthopedics;  Laterality: Right;  Right total shoulder arthroplasty     A IV Location/Drains/Wounds Patient Lines/Drains/Airways Status   Active Line/Drains/Airways    Name:   Placement date:   Placement time:   Site:   Days:   Peripheral IV 11/01/18 Right Antecubital   11/01/18    1130    Antecubital   less than 1   Incision (Closed) 10/04/18 Hip Right   10/04/18    1129     28          Intake/Output Last 24 hours No  intake or output data in the 24 hours ending 11/01/18 1819  Labs/Imaging Results for orders placed or performed during the hospital encounter of 11/01/18 (from the past 48 hour(s))  CBC with Differential/Platelet     Status: Abnormal   Collection Time: 11/01/18 11:33 AM  Result Value Ref Range   WBC 11.9 (H) 4.0 - 10.5 K/uL   RBC 3.50 (L) 3.87 - 5.11 MIL/uL   Hemoglobin 10.3 (L) 12.0 - 15.0 g/dL   HCT 32.3 (L) 36.0 - 46.0 %   MCV 92.3 80.0 - 100.0 fL   MCH 29.4 26.0 - 34.0 pg   MCHC 31.9 30.0 - 36.0 g/dL   RDW 14.2 11.5 - 15.5 %   Platelets 458 (H) 150 - 400 K/uL   nRBC 0.0 0.0 - 0.2 %   Neutrophils Relative % 72 %   Neutro Abs 8.6 (H) 1.7 - 7.7 K/uL   Lymphocytes Relative 18 %   Lymphs Abs 2.1 0.7 - 4.0 K/uL   Monocytes Relative 7 %   Monocytes Absolute 0.9 0.1 - 1.0 K/uL   Eosinophils Relative 1 %   Eosinophils Absolute 0.1 0.0 - 0.5 K/uL   Basophils Relative 1 %   Basophils Absolute 0.1 0.0 - 0.1 K/uL   Immature Granulocytes 1 %   Abs Immature Granulocytes 0.06 0.00 - 0.07 K/uL    Comment: Performed at [facility], [address] [place], [place] [address]  Comprehensive metabolic panel     Status: Abnormal   Collection Time: 11/01/18 11:33 AM  Result Value Ref Range   Sodium 140 135 - 145 mmol/L   Potassium 3.5 3.5 - 5.1 mmol/L   Chloride 109 98 - 111 mmol/L   CO2 23 22 - 32 mmol/L   Glucose, Bld 105 (H) 70 - 99 mg/dL   BUN 16 8 - 23 mg/dL   Creatinine, Ser 0.83 0.44 - 1.00 mg/dL   Calcium 9.8 8.9 - 10.3 mg/dL   Total Protein 7.1 6.5 - 8.1 g/dL   Albumin 3.4 (L) 3.5 - 5.0 g/dL   AST 20 15 - 41 U/L   ALT 11 0 - 44 U/L   Alkaline Phosphatase 69 38 - 126 U/L   Total Bilirubin 0.4 0.3 - 1.2 mg/dL   GFR calc non Af Amer >60 >60 mL/min   GFR calc Af Amer >60 >60 mL/min   Anion gap 8 5 - 15    Comment: Performed at [facility], [address] [place], [place] [address]  Urinalysis, Complete w Microscopic     Status:  Abnormal   Collection Time:  11/01/18  2:01 PM  Result Value Ref Range   Color, Urine YELLOW (A) YELLOW   APPearance HAZY (A) CLEAR   Specific Gravity, Urine 1.019 1.005 - 1.030   pH 5.0 5.0 - 8.0   Glucose, UA NEGATIVE NEGATIVE mg/dL   Hgb urine dipstick SMALL (A) NEGATIVE   Bilirubin Urine NEGATIVE NEGATIVE   Ketones, ur NEGATIVE NEGATIVE mg/dL   Protein, ur NEGATIVE NEGATIVE mg/dL   Nitrite POSITIVE (A) NEGATIVE   Leukocytes,Ua SMALL (A) NEGATIVE   RBC / HPF 0-5 0 - 5 RBC/hpf   WBC, UA 21-50 0 - 5 WBC/hpf   Bacteria, UA MANY (A) NONE SEEN   Squamous Epithelial / LPF 0-5 0 - 5   Mucus PRESENT    Ca Oxalate Crys, UA PRESENT     Comment: Performed at [facility], [address] [place], [place] [address]   Dg Chest 1 View  Result Date: 11/01/2018 CLINICAL DATA:  pt recently had a scheduled right hip replacement about a month ago. Pt states she was walking to bathroom last night and her leg/hip just gave out and she fell. Pt denies any LOC. Pt hit right hip. EXAM: CHEST  1 VIEW COMPARISON:  06/28/2012 and earlier FINDINGS: Heart size is normal. There is atherosclerotic calcification of the thoracic aorta. The lungs are clear. Probable skin fold overlies the RIGHT LATERAL chest. No acute fractures. Remote bilateral shoulder arthroplasty. IMPRESSION: 1. Probable skin fold overlying the RIGHT LATERAL chest. There has been injury to the chest, consider decubitus or erect views to exclude pneumothorax. 2. No evidence for acute cardiopulmonary disease. Electronically Signed   By: Nolon Nations M.D.   On: 11/01/2018 12:41   Dg Chest 2 View  Result Date: 11/01/2018 CLINICAL DATA:  Fall. EXAM: CHEST - 2 VIEW COMPARISON:  Radiographs of November 01, 2018. FINDINGS: The heart size and mediastinal contours are within normal limits. Both lungs are clear. The visualized skeletal structures are unremarkable. IMPRESSION: No active cardiopulmonary disease. Electronically Signed   By: Marijo Conception M.D.   On: 11/01/2018 13:32    Dg Knee 2 Views Right  Result Date: 11/01/2018 CLINICAL DATA:  female that presents to the ED due to fall. Patient lives at [facility] at [place]. Patient states that last night she tried to go to the bathroom and her "right leg gave way." Patient had a hip surgery last month, and did not receive any physical therapy. Increased pain in the RIGHT knee since the fall. History of osteopenia, osteoarthritis. EXAM: RIGHT KNEE - 1-2 VIEW COMPARISON:  None. FINDINGS: There is no acute fracture or subluxation. There is mild narrowing of the LATERAL compartment. No joint effusion. IMPRESSION: No evidence for acute abnormality. Electronically Signed   By: Nolon Nations M.D.   On: 11/01/2018 17:24   Ct Pelvis Wo Contrast  Result Date: 11/01/2018 CLINICAL DATA:  Severe right hip pain following a fall. Previous right hip replacement. EXAM: CT PELVIS WITHOUT CONTRAST TECHNIQUE: Multidetector CT imaging of the pelvis was performed following the standard protocol without intravenous contrast. COMPARISON:  Right hip radiographs obtained earlier today. Small sq FINDINGS: Urinary Tract:  No abnormality visualized. Bowel:  Unremarkable visualized pelvic bowel loops. Vascular/Lymphatic: Atheromatous arterial calcifications without aneurysm. No enlarged lymph nodes. Reproductive:  Surgically absent uterus. No adnexal masses. Other:  None. Musculoskeletal: Lower lumbar spine degenerative changes and fixation hardware. Bilateral hip prostheses. No fracture or dislocation seen. IMPRESSION: 1. No fracture or dislocation.  2. Bilateral hip prostheses. 3. Lower lumbar spine degenerative changes and fixation hardware. Electronically Signed   By: Claudie Revering M.D.   On: 11/01/2018 14:45   Dg Hip Unilat W Or Wo Pelvis 2-3 Views Right  Result Date: 11/01/2018 CLINICAL DATA:  RIGHT hip pain. EXAM: DG HIP (WITH OR WITHOUT PELVIS) 2-3V RIGHT COMPARISON:  None. FINDINGS: Bilateral total hip arthroplasties noted. No acute fracture,  dislocation or definite complicating features noted. No suspicious focal bony lesions are present. Surgical changes in the LOWER lumbar spine are present. IMPRESSION: 1. No evidence of acute abnormality 2. Bilateral hip arthroplasties without definite complicating features. Electronically Signed   By: Margarette Canada M.D.   On: 11/01/2018 12:34    Pending Labs Unresulted Labs (From admission, onward)    Start     Ordered   11/01/18 1650  SARS Coronavirus 2 (CEPHEID - Performed in [place] hospital lab), Hosp Order  (Asymptomatic Patients Labs)  Once,   STAT    Question:  Rule Out  Answer:  Yes   11/01/18 1649   Signed and Held  Basic metabolic panel  Tomorrow morning,   R     Signed and Held   Signed and Held  CBC  Tomorrow morning,   R     Signed and Held          Vitals/Pain Today's Vitals   11/01/18 1800 11/01/18 1805 11/01/18 1809 11/01/18 1815  BP: 129/84     Pulse: (!) 149  72 73  Resp:      Temp:      TempSrc:      SpO2: 98%  100% 100%  Weight:      Height:      PainSc:  4       Isolation Precautions No active isolations  Medications Medications  morphine 4 MG/ML injection 4 mg (4 mg Intravenous Given 11/01/18 1740)  HYDROmorphone (DILAUDID) tablet 2 mg (has no administration in time range)  cefTRIAXone (ROCEPHIN) 1 g in sodium chloride 0.9 % 100 mL IVPB (has no administration in time range)  cefTRIAXone (ROCEPHIN) 1 g in sodium chloride 0.9 % 100 mL IVPB (0 g Intravenous Stopped 11/01/18 1554)    Mobility walks with device Low fall risk   Focused Assessments Pain control   R Recommendations: See Admitting Provider Note  Report given to:   Additional Notes: pt was able to sit on edge of bed with assistance and stand with assistance when changing bed linens.

## 2018-11-01 NOTE — NC FL2 (Signed)
[place] LEVEL OF CARE SCREENING TOOL     IDENTIFICATION  Patient Name: [name] Birthdate: Mar 27, 1926 Sex: female Admission Date (Current Location): 11/01/2018  [employer] and [place] Number:  Engineering geologist and Address:  [facility], [address] [place], [place] [address]      Provider Number: [id number]  Attending Physician Name and Address:  Merlyn Lot, MD  Relative Name and Phone Number:  [place],Shirley   Sister   [phone]    Current Level of Care: Hospital Recommended Level of Care: [place] Prior Approval Number:    Date Approved/Denied:   PASRR Number: [id number]  Discharge Plan: SNF    Current Diagnoses: Patient Active Problem List   Diagnosis Date Noted  . Osteoarthritis of right hip 10/04/2018  . TSH elevation 09/24/2018  . Pre-operative clearance 09/24/2018  . Frequent UTI 11/09/2017  . Leukocytosis 02/06/2016  . Hyperlipidemia with target LDL less than 160 09/24/2014  . Routine general medical examination at a health care facility 09/24/2014  . MACULAR DEGENERATION 08/07/2008  . Glaucoma 08/07/2008  . Insomnia 08/07/2008  . Constipation 07/22/2008  . NEURALGIA, TRIGEMINAL 01/07/2008  . Allergic rhinitis 09/03/2007  . COPD 09/03/2007  . GERD 09/03/2007  . Osteoarthritis 09/03/2007    Orientation RESPIRATION BLADDER Height & Weight     Self, Time, Situation, Place  Normal Continent Weight: 125 lb (56.7 kg) Height:  5\' 2"  (157.5 cm)  BEHAVIORAL SYMPTOMS/MOOD NEUROLOGICAL BOWEL NUTRITION STATUS      Continent Diet  AMBULATORY STATUS COMMUNICATION OF NEEDS Skin   Extensive Assist Verbally Normal                       Personal Care Assistance Level of Assistance  Bathing, Feeding, Dressing Bathing Assistance: Limited assistance Feeding assistance: Limited assistance Dressing Assistance: Maximum assistance     Functional Limitations Info  Sight,  Hearing, Speech Sight Info: Adequate Hearing Info: Adequate Speech Info: Adequate    SPECIAL CARE FACTORS FREQUENCY  PT (By licensed PT), OT (By licensed OT)     PT Frequency: 5 times per week OT Frequency: 5 times per week            Contractures Contractures Info: Not present    Additional Factors Info  Code Status, Allergies Code Status Info: FULL Allergies Info: Codeine, pregabalin, promethazine, sulfonamide           Current Medications (11/01/2018):  This is the current hospital active medication list Current Facility-Administered Medications  Medication Dose Route Frequency Provider Last Rate Last Dose  . cefTRIAXone (ROCEPHIN) 1 g in sodium chloride 0.9 % 100 mL IVPB  1 g Intravenous Once Merlyn Lot, MD 200 mL/hr at 11/01/18 1506 1 g at 11/01/18 1506  . morphine 4 MG/ML injection 4 mg  4 mg Intravenous Q3H PRN Merlyn Lot, MD   4 mg at 11/01/18 1304   Current Outpatient Medications  Medication Sig Dispense Refill  . acetaminophen (TYLENOL) 325 MG tablet Take 1-2 tablets (325-650 mg total) by mouth every 6 (six) hours as needed for mild pain (pain score 1-3 or temp > 100.5). 120 tablet 0  . aspirin 81 MG chewable tablet Chew 1 tablet (81 mg total) by mouth 2 (two) times daily. 60 tablet 1  . beta carotene w/minerals (OCUVITE) tablet Take 1 tablet by mouth 2 (two) times daily.    . bisacodyl (DULCOLAX) 5 MG EC tablet Take 5 mg by mouth daily as needed  for moderate constipation.    Marland Kitchen HYDROmorphone (DILAUDID) 2 MG tablet Take 2 mg by mouth 4 (four) times daily as needed for pain.    Marland Kitchen omeprazole (PRILOSEC) 20 MG capsule Take 20 mg by mouth daily before breakfast.     . ondansetron (ZOFRAN) 4 MG tablet Take 1 tablet (4 mg total) by mouth every 6 (six) hours as needed for nausea. 20 tablet 0  . polyethylene glycol (MIRALAX / GLYCOLAX) 17 g packet Take 17 g by mouth daily as needed for moderate constipation.    . senna (SENOKOT) 8.6 MG TABS tablet Take 1 tablet  (8.6 mg total) by mouth 2 (two) times daily. 120 each 0  . Travoprost, BAK Free, (TRAVATAN Z) 0.004 % SOLN ophthalmic solution Place 1 drop into both eyes at bedtime.     . bisacodyl (DULCOLAX) 10 MG suppository Place 10 mg rectally daily as needed for moderate constipation.    . docusate sodium (COLACE) 100 MG capsule Take 1 capsule (100 mg total) by mouth 2 (two) times daily. 60 capsule 1  . morphine (MSIR) 15 MG tablet Take 15 mg by mouth 4 (four) times daily.       Discharge Medications: Please see discharge summary for a list of discharge medications.  Relevant Imaging Results:  Relevant Lab Results:   Additional Information SSN:  [id number]  Tania Lamara Brecht, LCSW

## 2018-11-02 DIAGNOSIS — W19XXXA Unspecified fall, initial encounter: Secondary | ICD-10-CM | POA: Diagnosis not present

## 2018-11-02 DIAGNOSIS — Z9181 History of falling: Secondary | ICD-10-CM | POA: Diagnosis not present

## 2018-11-02 DIAGNOSIS — Z96611 Presence of right artificial shoulder joint: Secondary | ICD-10-CM | POA: Diagnosis present

## 2018-11-02 DIAGNOSIS — K219 Gastro-esophageal reflux disease without esophagitis: Secondary | ICD-10-CM | POA: Diagnosis present

## 2018-11-02 DIAGNOSIS — Z87891 Personal history of nicotine dependence: Secondary | ICD-10-CM | POA: Diagnosis not present

## 2018-11-02 DIAGNOSIS — Z833 Family history of diabetes mellitus: Secondary | ICD-10-CM | POA: Diagnosis not present

## 2018-11-02 DIAGNOSIS — M8580 Other specified disorders of bone density and structure, unspecified site: Secondary | ICD-10-CM | POA: Diagnosis present

## 2018-11-02 DIAGNOSIS — R531 Weakness: Secondary | ICD-10-CM | POA: Diagnosis present

## 2018-11-02 DIAGNOSIS — Z8249 Family history of ischemic heart disease and other diseases of the circulatory system: Secondary | ICD-10-CM | POA: Diagnosis not present

## 2018-11-02 DIAGNOSIS — Z1159 Encounter for screening for other viral diseases: Secondary | ICD-10-CM | POA: Diagnosis not present

## 2018-11-02 DIAGNOSIS — R2689 Other abnormalities of gait and mobility: Secondary | ICD-10-CM | POA: Diagnosis present

## 2018-11-02 DIAGNOSIS — M25551 Pain in right hip: Secondary | ICD-10-CM | POA: Diagnosis present

## 2018-11-02 DIAGNOSIS — Z96641 Presence of right artificial hip joint: Secondary | ICD-10-CM | POA: Diagnosis present

## 2018-11-02 DIAGNOSIS — K5900 Constipation, unspecified: Secondary | ICD-10-CM | POA: Diagnosis present

## 2018-11-02 DIAGNOSIS — N390 Urinary tract infection, site not specified: Secondary | ICD-10-CM | POA: Diagnosis not present

## 2018-11-02 DIAGNOSIS — Z7401 Bed confinement status: Secondary | ICD-10-CM | POA: Diagnosis not present

## 2018-11-02 DIAGNOSIS — M2550 Pain in unspecified joint: Secondary | ICD-10-CM | POA: Diagnosis not present

## 2018-11-02 DIAGNOSIS — J449 Chronic obstructive pulmonary disease, unspecified: Secondary | ICD-10-CM | POA: Diagnosis present

## 2018-11-02 DIAGNOSIS — N3000 Acute cystitis without hematuria: Secondary | ICD-10-CM | POA: Diagnosis present

## 2018-11-02 DIAGNOSIS — Z981 Arthrodesis status: Secondary | ICD-10-CM | POA: Diagnosis not present

## 2018-11-02 DIAGNOSIS — I959 Hypotension, unspecified: Secondary | ICD-10-CM | POA: Diagnosis not present

## 2018-11-02 LAB — BASIC METABOLIC PANEL
Anion gap: 8 (ref 5–15)
BUN: 15 mg/dL (ref 8–23)
CO2: 21 mmol/L — ABNORMAL LOW (ref 22–32)
Calcium: 9.4 mg/dL (ref 8.9–10.3)
Chloride: 113 mmol/L — ABNORMAL HIGH (ref 98–111)
Creatinine, Ser: 0.89 mg/dL (ref 0.44–1.00)
GFR calc Af Amer: >60 mL/min (ref >60)
GFR calc non Af Amer: 57 mL/min — ABNORMAL LOW (ref >60)
Glucose, Bld: 84 mg/dL (ref 70–99)
Potassium: 3.7 mmol/L (ref 3.5–5.1)
Sodium: 142 mmol/L (ref 135–145)

## 2018-11-02 LAB — CBC
HCT: 34.3 % — ABNORMAL LOW (ref 36.0–46.0)
Hemoglobin: 10.8 g/dL — ABNORMAL LOW (ref 12.0–15.0)
MCH: 29.5 pg (ref 26.0–34.0)
MCHC: 31.5 g/dL (ref 30.0–36.0)
MCV: 93.7 fL (ref 80.0–100.0)
Platelets: 406 10*3/uL — ABNORMAL HIGH (ref 150–400)
RBC: 3.66 MIL/uL — ABNORMAL LOW (ref 3.87–5.11)
RDW: 14.1 % (ref 11.5–15.5)
WBC: 11.2 10*3/uL — ABNORMAL HIGH (ref 4.0–10.5)
nRBC: 0 % (ref 0.0–0.2)

## 2018-11-02 LAB — MRSA PCR SCREENING: MRSA by PCR: NEGATIVE

## 2018-11-02 MED ORDER — HYDROMORPHONE HCL 1 MG/ML IJ SOLN
1.0000 mg | INTRAMUSCULAR | Status: DC | PRN
  Administered 2018-11-02 – 2018-11-04 (×5): 1 mg via INTRAVENOUS
  Filled 2018-11-02 (×5): qty 1

## 2018-11-02 MED ORDER — DIAZEPAM 2 MG PO TABS
2.0000 mg | ORAL_TABLET | Freq: Four times a day (QID) | ORAL | Status: DC | PRN
  Administered 2018-11-02 – 2018-11-03 (×3): 2 mg via ORAL
  Filled 2018-11-02 (×3): qty 1

## 2018-11-02 MED ORDER — KETOROLAC TROMETHAMINE 15 MG/ML IJ SOLN
15.0000 mg | Freq: Once | INTRAMUSCULAR | Status: AC
  Administered 2018-11-02: 15 mg via INTRAVENOUS
  Filled 2018-11-02: qty 1

## 2018-11-02 NOTE — Progress Notes (Signed)
[facility] [place] NAME: [name]    MR#:  [id number]  DATE OF BIRTH:  01/07/1926  SUBJECTIVE:  CHIEF COMPLAINT:   Chief Complaint  Patient presents with   Fall   This morning patient was complaining of severe right hip pain despite PRN p.o. Dilaudid and IV morphine.  Orthopedic consult placed.  Changes made to pain meds after discussing with pharmacist.  REVIEW OF SYSTEMS:  Review of Systems  Constitutional: Negative for chills and fever.  HENT: Negative for hearing loss and tinnitus.   Eyes: Negative for blurred vision and double vision.  Respiratory: Negative for cough and shortness of breath.   Cardiovascular: Negative for chest pain and palpitations.  Gastrointestinal: Negative for heartburn and nausea.  Genitourinary: Negative for dysuria and urgency.  Musculoskeletal: Negative for myalgias.       Right hip pain  Skin: Negative for itching and rash.  Neurological: Negative for dizziness and headaches.  Psychiatric/Behavioral: Negative for depression and hallucinations.    DRUG ALLERGIES:   Allergies  Allergen Reactions   Codeine Itching and Nausea Only    Small amounts okay   Pregabalin Other (See Comments)    Confusion and hallucinations   Promethazine Hcl Other (See Comments)    Muscle cramps   Sulfonamide Derivatives Nausea Only   VITALS:  Blood pressure (!) 133/57, pulse 67, temperature 98.4 F (36.9 C), temperature source Oral, resp. rate 18, height 5\' 2"  (1.575 m), weight 54 kg, SpO2 100 %. PHYSICAL EXAMINATION:  Physical Exam  GENERAL:  85 y.o.-year-old patient lying in the bed moderate amount of pain.  EYES: Pupils equal, round, reactive to light and accommodation. No scleral icterus. Extraocular muscles intact.  HEENT: Head atraumatic, normocephalic. Oropharynx and nasopharynx clear.  NECK:  Supple, no jugular venous distention. No thyroid enlargement, no tenderness.  LUNGS: Normal breath  sounds bilaterally, no wheezing, rales,rhonchi or crepitation. No use of accessory muscles of respiration.  CARDIOVASCULAR: S1, S2 normal. No murmurs, rubs, or gallops.  ABDOMEN: Soft, nontender, nondistended. Bowel sounds present. No organomegaly or mass.  EXTREMITIES: Limitation of movement of right lower extremity due to severe right hip pains.  No edema.  No cyanosis.   MUSCULOSKELETAL: Normal bulk, Unable to assess due to significant amount of pain in the right lower extremity NEUROLOGIC:Alert and oriented x 3.  Sensation to light touch.gait not checked.   PSYCHIATRIC: The patient is alert and oriented x 3.  SKIN: No obvious rash, lesion, or ulcer.   LABORATORY PANEL:  Female CBC Recent Labs  Lab 11/02/18 0604  WBC 11.2*  HGB 10.8*  HCT 34.3*  PLT 406*   ------------------------------------------------------------------------------------------------------------------ Chemistries  Recent Labs  Lab 11/01/18 1133 11/02/18 0604  NA 140 142  K 3.5 3.7  CL 109 113*  CO2 23 21*  GLUCOSE 105* 84  BUN 16 15  CREATININE 0.83 0.89  CALCIUM 9.8 9.4  AST 20  --   ALT 11  --   ALKPHOS 69  --   BILITOT 0.4  --    RADIOLOGY:  Dg Chest 2 View  Result Date: 11/01/2018 CLINICAL DATA:  Fall. EXAM: CHEST - 2 VIEW COMPARISON:  Radiographs of November 01, 2018. FINDINGS: The heart size and mediastinal contours are within normal limits. Both lungs are clear. The visualized skeletal structures are unremarkable. IMPRESSION: No active cardiopulmonary disease. Electronically Signed   By: Marijo Conception M.D.   On: 11/01/2018 13:32   Dg Knee 2 Views  Right  Result Date: 11/01/2018 CLINICAL DATA:  female that presents to the ED due to fall. Patient lives at [facility] at [place]. Patient states that last night she tried to go to the bathroom and her "right leg gave way." Patient had a hip surgery last month, and did not receive any physical therapy. Increased pain in the RIGHT knee since the fall.  History of osteopenia, osteoarthritis. EXAM: RIGHT KNEE - 1-2 VIEW COMPARISON:  None. FINDINGS: There is no acute fracture or subluxation. There is mild narrowing of the LATERAL compartment. No joint effusion. IMPRESSION: No evidence for acute abnormality. Electronically Signed   By: Nolon Nations M.D.   On: 11/01/2018 17:24   Ct Pelvis Wo Contrast  Result Date: 11/01/2018 CLINICAL DATA:  Severe right hip pain following a fall. Previous right hip replacement. EXAM: CT PELVIS WITHOUT CONTRAST TECHNIQUE: Multidetector CT imaging of the pelvis was performed following the standard protocol without intravenous contrast. COMPARISON:  Right hip radiographs obtained earlier today. Small sq FINDINGS: Urinary Tract:  No abnormality visualized. Bowel:  Unremarkable visualized pelvic bowel loops. Vascular/Lymphatic: Atheromatous arterial calcifications without aneurysm. No enlarged lymph nodes. Reproductive:  Surgically absent uterus. No adnexal masses. Other:  None. Musculoskeletal: Lower lumbar spine degenerative changes and fixation hardware. Bilateral hip prostheses. No fracture or dislocation seen. IMPRESSION: 1. No fracture or dislocation. 2. Bilateral hip prostheses. 3. Lower lumbar spine degenerative changes and fixation hardware. Electronically Signed   By: Claudie Revering M.D.   On: 11/01/2018 14:45   ASSESSMENT AND PLAN:   85 y.o. female with pertinent past medical history of degenerative joint disease, GERD, OA, osteopenia, small bowel obstruction, COPD, bradycardia, and recent right total hip arthroplasty 10/04/2018 presenting to the ED from [facility] independent living with complaints of severe right hip pain post fall.  1. Severe right hip pain -patient with recent right total hip arthroplasty 10/04/2018 at [facility], pain now worse with fall. -Imaging studies including CT scan of the pelvis with no acute fractures. However due to severity of pain this morning despite several pain meds, I have  requested for orthopedic consultation to evaluate and determine if MRI may be beneficial for further evaluation. Patient on p.o. Dilaudid at home.  Also on MS Contin at home.  Changed p.o. Dilaudid to PRN IV Dilaudid.  Given a dose of IV Toradol with some improvement this morning.  PT/OT eval when pain is better controlled  2. Fall -likely due to weakness in a patient with recent hip surgery and now with UTI -Falls precautions initiated -PT/OT eval when pain better controlled  3. UTI -UA positive for UTI -Urine cultures pending -Started ceftriaxone  4. Chronic COPD -no evidence of exacerbation -Chest x-ray shows no active cardiopulmonary disease -DuoNebs PRN  5.  GERD -Protonix  6. DVT prophylaxis - Enoxaparin    All the records are reviewed and case discussed with Care Management/Social Worker. Management plans discussed with the patient, family and they are in agreement.  CODE STATUS: Full Code  TOTAL TIME TAKING CARE OF THIS PATIENT: 38 minutes.   More than 50% of the time was spent in counseling/coordination of care: YES  POSSIBLE D/C IN 2 DAYS, DEPENDING ON CLINICAL CONDITION.   Fallyn Munnerlyn M.D on 11/02/2018 at 12:58 PM  Between 7am to 6pm - Pager - [phone]  After 6pm go to [email or web address] - password [email or web address] [facility]  [employer] [place] [facility]  Office  [phone]  CC: Primary care physician; Janith Lima, MD  Note: This dictation was  prepared with Dragon dictation along with smaller phrase technology. Any transcriptional errors that result from this process are unintentional.

## 2018-11-02 NOTE — Plan of Care (Signed)

## 2018-11-02 NOTE — Consult Note (Signed)
ORTHOPAEDIC CONSULTATION  REQUESTING PHYSICIAN: Otila Back, MD  Chief Complaint: Right hip pain  HPI: [name] is a 85 y.o. female who complains of right hip pain.  She had a right anterior total hip replacement done by Dr. Lyla Glassing in [place] 1 month ago.  She developed pain a couple weeks later.  She fell a day or 2 ago and was admitted to the [facility] facility for urinary tract infection.  She denies any fever or chills.  She has pain with movement of the right hip and ambulation.  X-rays from surgery and 11/01/2018 at [facility] show a satisfactory right total hip replacement with no changes.  No evidence of fracture.  A CT scan was done which did not show any further pathology.  Past Medical History:  Diagnosis Date  . Adenomatous colon polyp 04/26/95   tubulovillous  . Allergy   . Arthritis   . Bradycardia   . Bursitis   . Cataracts, bilateral   . Constipation   . COPD, moderate ([place])    "THIS WAS TOLD TO ME BACK IN THE DAYS WHEN EVERYONE THAT WAS  A SMOKER WAS TOLD THEY WERE A COPD. IM 90 AND I DONT GET SHORT OF BREATH , I DONT HAVE IT "  . Cystitis   . Degenerative joint disease   . Depression    DENIES   . Gastritis   . GERD (gastroesophageal reflux disease)   . Glaucoma   . Hypoglycemia   . Impaired memory   . Insomnia   . Lack of bladder control   . Macular degeneration   . OA (osteoarthritis)   . Osteopenia   . Renal cyst    right  . Shortness of breath   . Small bowel obstruction ([place])   . Trigeminal neuralgia    DENIES   . Urinary incontinence   . Wears dentures    Past Surgical History:  Procedure Laterality Date  . ABDOMINAL HYSTERECTOMY  1992   nonmalignant reasons  . BACK SURGERY  05/20/08   LUMBAR SPINAL FUSION   . BREAST SURGERY     EXCISION OF CALCIUM DEPOSIT   . CATARACT EXTRACTION Bilateral   . CHOLECYSTECTOMY  1994  . DILATION AND CURETTAGE OF UTERUS    . EYE SURGERY     RIGHT EYE HEMORRHAGE SURGERY   . JOINT REPLACEMENT  2005   LEFT HIP    . PARTIAL COLECTOMY  04/26/95   tubulovillous adenoma  . SHOULDER SURGERY  01/22/2009   left  . TONSILLECTOMY    . TOTAL HIP ARTHROPLASTY Right 10/04/2018   Procedure: TOTAL HIP ARTHROPLASTY ANTERIOR APPROACH;  Surgeon: Rod Can, MD;  Location: [facility];  Service: Orthopedics;  Laterality: Right;  . TOTAL SHOULDER ARTHROPLASTY Right 07/05/2012   Procedure: RIGHT TOTAL SHOULDER ARTHROPLASTY;  Surgeon: Marin Shutter, MD;  Location: [facility];  Service: Orthopedics;  Laterality: Right;  Right total shoulder arthroplasty   Social History   Socioeconomic History  . Marital status: Widowed    Spouse name: Not on file  . Number of children: Not on file  . Years of education: Not on file  . Highest education level: Not on file  Occupational History  . Not on file  Social Needs  . Financial resource strain: Not on file  . Food insecurity    Worry: Not on file    Inability: Not on file  . Transportation needs    Medical: Not on file    Non-medical: Not on  file  Tobacco Use  . Smoking status: Former Smoker    Packs/day: 1.00    Years: 50.00    Pack years: 50.00    Types: Cigarettes    Quit date: 06/28/2005    Years since quitting: 13.3  . Smokeless tobacco: Never Used  Substance and Sexual Activity  . Alcohol use: No  . Drug use: No  . Sexual activity: Never  Lifestyle  . Physical activity    Days per week: Not on file    Minutes per session: Not on file  . Stress: Not on file  Relationships  . Social Herbalist on phone: Not on file    Gets together: Not on file    Attends religious service: Not on file    Active member of club or organization: Not on file    Attends meetings of clubs or organizations: Not on file    Relationship status: Not on file  Other Topics Concern  . Not on file  Social History Narrative   Childbirth x 2   Retired Therapist, sports         Family History  Problem Relation Age of Onset  . Cancer Father        colon  . Heart disease Sister   .  Colon polyps Brother   . Diabetes Brother    Allergies  Allergen Reactions  . Codeine Itching and Nausea Only    Small amounts okay  . Pregabalin Other (See Comments)    Confusion and hallucinations  . Promethazine Hcl Other (See Comments)    Muscle cramps  . Sulfonamide Derivatives Nausea Only   Prior to Admission medications   Medication Sig Start Date End Date Taking? Authorizing Provider  acetaminophen (TYLENOL) 325 MG tablet Take 1-2 tablets (325-650 mg total) by mouth every 6 (six) hours as needed for mild pain (pain score 1-3 or temp > 100.5). 10/05/18  Yes Swinteck, Aaron Edelman, MD  aspirin 81 MG chewable tablet Chew 1 tablet (81 mg total) by mouth 2 (two) times daily. 10/05/18  Yes Swinteck, Aaron Edelman, MD  beta carotene w/minerals (OCUVITE) tablet Take 1 tablet by mouth 2 (two) times daily.   Yes [provider]  bisacodyl (DULCOLAX) 5 MG EC tablet Take 5 mg by mouth daily as needed for moderate constipation.   Yes [provider]  HYDROmorphone (DILAUDID) 2 MG tablet Take 2 mg by mouth 4 (four) times daily as needed for pain.   Yes [provider]  omeprazole (PRILOSEC) 20 MG capsule Take 20 mg by mouth daily before breakfast.    Yes [provider]  ondansetron (ZOFRAN) 4 MG tablet Take 1 tablet (4 mg total) by mouth every 6 (six) hours as needed for nausea. 10/05/18  Yes Swinteck, Aaron Edelman, MD  polyethylene glycol (MIRALAX / GLYCOLAX) 17 g packet Take 17 g by mouth daily as needed for moderate constipation.   Yes [provider]  senna (SENOKOT) 8.6 MG TABS tablet Take 1 tablet (8.6 mg total) by mouth 2 (two) times daily. 10/05/18  Yes Swinteck, Aaron Edelman, MD  Travoprost, BAK Free, (TRAVATAN Z) 0.004 % SOLN ophthalmic solution Place 1 drop into both eyes at bedtime.  10/24/16  Yes [provider]  bisacodyl (DULCOLAX) 10 MG suppository Place 10 mg rectally daily as needed for moderate constipation.    [provider]  docusate sodium  (COLACE) 100 MG capsule Take 1 capsule (100 mg total) by mouth 2 (two) times daily. 10/05/18   Swinteck,  Aaron Edelman, MD  morphine (MSIR) 15 MG tablet Take 15 mg by mouth 4 (four) times daily. 10/25/18   [provider]   Dg Chest 1 View  Result Date: 11/01/2018 CLINICAL DATA:  pt recently had a scheduled right hip replacement about a month ago. Pt states she was walking to bathroom last night and her leg/hip just gave out and she fell. Pt denies any LOC. Pt hit right hip. EXAM: CHEST  1 VIEW COMPARISON:  06/28/2012 and earlier FINDINGS: Heart size is normal. There is atherosclerotic calcification of the thoracic aorta. The lungs are clear. Probable skin fold overlies the RIGHT LATERAL chest. No acute fractures. Remote bilateral shoulder arthroplasty. IMPRESSION: 1. Probable skin fold overlying the RIGHT LATERAL chest. There has been injury to the chest, consider decubitus or erect views to exclude pneumothorax. 2. No evidence for acute cardiopulmonary disease. Electronically Signed   By: Nolon Nations M.D.   On: 11/01/2018 12:41   Dg Chest 2 View  Result Date: 11/01/2018 CLINICAL DATA:  Fall. EXAM: CHEST - 2 VIEW COMPARISON:  Radiographs of November 01, 2018. FINDINGS: The heart size and mediastinal contours are within normal limits. Both lungs are clear. The visualized skeletal structures are unremarkable. IMPRESSION: No active cardiopulmonary disease. Electronically Signed   By: Marijo Conception M.D.   On: 11/01/2018 13:32   Dg Knee 2 Views Right  Result Date: 11/01/2018 CLINICAL DATA:  female that presents to the ED due to fall. Patient lives at [facility] at [place]. Patient states that last night she tried to go to the bathroom and her "right leg gave way." Patient had a hip surgery last month, and did not receive any physical therapy. Increased pain in the RIGHT knee since the fall. History of osteopenia, osteoarthritis. EXAM: RIGHT KNEE - 1-2 VIEW COMPARISON:  None. FINDINGS: There is no acute  fracture or subluxation. There is mild narrowing of the LATERAL compartment. No joint effusion. IMPRESSION: No evidence for acute abnormality. Electronically Signed   By: Nolon Nations M.D.   On: 11/01/2018 17:24   Ct Pelvis Wo Contrast  Result Date: 11/01/2018 CLINICAL DATA:  Severe right hip pain following a fall. Previous right hip replacement. EXAM: CT PELVIS WITHOUT CONTRAST TECHNIQUE: Multidetector CT imaging of the pelvis was performed following the standard protocol without intravenous contrast. COMPARISON:  Right hip radiographs obtained earlier today. Small sq FINDINGS: Urinary Tract:  No abnormality visualized. Bowel:  Unremarkable visualized pelvic bowel loops. Vascular/Lymphatic: Atheromatous arterial calcifications without aneurysm. No enlarged lymph nodes. Reproductive:  Surgically absent uterus. No adnexal masses. Other:  None. Musculoskeletal: Lower lumbar spine degenerative changes and fixation hardware. Bilateral hip prostheses. No fracture or dislocation seen. IMPRESSION: 1. No fracture or dislocation. 2. Bilateral hip prostheses. 3. Lower lumbar spine degenerative changes and fixation hardware. Electronically Signed   By: Claudie Revering M.D.   On: 11/01/2018 14:45   Dg Hip Unilat W Or Wo Pelvis 2-3 Views Right  Result Date: 11/01/2018 CLINICAL DATA:  RIGHT hip pain. EXAM: DG HIP (WITH OR WITHOUT PELVIS) 2-3V RIGHT COMPARISON:  None. FINDINGS: Bilateral total hip arthroplasties noted. No acute fracture, dislocation or definite complicating features noted. No suspicious focal bony lesions are present. Surgical changes in the LOWER lumbar spine are present. IMPRESSION: 1. No evidence of acute abnormality 2. Bilateral hip arthroplasties without definite complicating features. Electronically Signed   By: Margarette Canada M.D.   On: 11/01/2018 12:34    Positive ROS: All other systems have been reviewed and were  otherwise negative with the exception of those mentioned in the HPI and as  above.  Physical Exam: General: Alert, no acute distress Cardiovascular: No pedal edema Respiratory: No cyanosis, no use of accessory musculature GI: No organomegaly, abdomen is soft and non-tender Skin: No lesions in the area of chief complaint Neurologic: Sensation intact distally Psychiatric: Patient is competent for consent with normal mood and affect Lymphatic: No axillary or cervical lymphadenopathy  MUSCULOSKELETAL: The patient is alert and cooperative.  The anterior right total hip incision is well-healed.  There is no redness or warmth.  There is some bruising and residual induration.  Passive motion is satisfactory.  Hip is stable.  No other pain is noted.  Neurovascular status is normal distally.  Assessment: Right hip pain.  It appears to be mostly incisional.  There is no sign of infection.  Plan: We will apply heat to the area.  Physical therapy will ambulate her with minimal weightbearing. Follow-up with Dr. Lyla Glassing in his office next week.    Park Breed, MD [phone]   11/02/2018 1:16 PM

## 2018-11-02 NOTE — Progress Notes (Signed)
Physical Therapy Treatment Patient Details Name: [name] MRN: [id number] DOB: 1926/03/06 Today's Date: 11/02/2018    History of Present Illness 85 y/o female who had a R total hip replacement 1 month ago, had done relatively well walking >100 ft POD1 and for ~1 week was walking long distances each day - then, without know injury stated having severe pain with WBing and has been limping and struggling with severe pain for the last few weeks.  Pt finally suffered a fall and came to the ED.    PT Comments    Pt showed very good effort and enthusiasm for PT this date.  She is still having considerable pain, but much less so than yesterday and it is much less functionally limiting.  She showed ability to tolerate "prolonged" standing with walker and was able to do multiple forward and backward near hopping (TTWBing on R) steps with good safety and only mild increased pain.  She also displayed much more tolerance with R LE exercises, though again still painful and guarded.  Pt still unsafe to return to apartment at [place] and will need rehab once medically cleared for d/c.     Follow Up Recommendations  SNF     Equipment Recommendations  None recommended by PT    Recommendations for Other Services       Precautions / Restrictions Precautions Precautions: Anterior Hip;Fall Restrictions Weight Bearing Restrictions: No Other Position/Activity Restrictions: Pt was WBAT post surgery 1 month ago, per ortho note (7/10) "minimal weight-bearing"    Mobility  Bed Mobility Overal bed mobility: Needs Assistance Bed Mobility: Supine to Sit;Sit to Supine     Supine to sit: Min assist Sit to supine: Mod assist   General bed mobility comments: Pt much more mobile in getting to side (able to get to and tolerate laying on R) needed light assist to get to sitting, heavier assist with b/l LE to get back into bed  Transfers Overall transfer level: Needs assistance Equipment used: Rolling walker  (2 wheeled) Transfers: Sit to/from Stand Sit to Stand: Min assist         General transfer comment: much more confident this date with less hesitancy, still intolerant of much weight through R and reliant on walker once up  Ambulation/Gait Ambulation/Gait assistance: Min guard Gait Distance (Feet): 8 Feet Assistive device: Rolling walker (2 wheeled)       General Gait Details: forward and back 45ft X 2 with essentially TTWBing on R and heavy reliance on walker.  Much more confident and stable than yesterday in ED (though still guarded).  Pt's O2 remained stable, she had no LOBs or overt safety issues, though clearly she is unable to attempt prolonged ambulation of any kind at this time   Stairs             Wheelchair Mobility    Modified Rankin (Stroke Patients Only)       Balance                                            Cognition Arousal/Alertness: Awake/alert Behavior During Therapy: WFL for tasks assessed/performed Overall Cognitive Status: Within Functional Limits for tasks assessed  Exercises General Exercises - Lower Extremity Ankle Circles/Pumps: AROM;10 reps Short Arc Quad: Strengthening;10 reps Heel Slides: Strengthening;10 reps Hip ABduction/ADduction: AROM;10 reps(pt with much, much less pain this date during R Ab/Ad) Straight Leg Raises: AROM;10 reps(surprisingly able to perform on L w/o assist, + pain)    General Comments        Pertinent Vitals/Pain Pain Assessment: 0-10 Pain Score: 5  Pain Location: mostly focused in the lateral thigh    Home Living                      Prior Function            PT Goals (current goals can now be found in the care plan section) Progress towards PT goals: Progressing toward goals    Frequency    Min 2X/week      PT Plan Current plan remains appropriate    Co-evaluation              AM-PAC PT "6  Clicks" Mobility   Outcome Measure  Help needed turning from your back to your side while in a flat bed without using bedrails?: None Help needed moving from lying on your back to sitting on the side of a flat bed without using bedrails?: A Little Help needed moving to and from a bed to a chair (including a wheelchair)?: A Lot Help needed standing up from a chair using your arms (e.g., wheelchair or bedside chair)?: A Little Help needed to walk in hospital room?: A Lot Help needed climbing 3-5 steps with a railing? : Total 6 Click Score: 15    End of Session Equipment Utilized During Treatment: Gait belt Activity Tolerance: Patient limited by pain;Patient tolerated treatment well Patient left: with bed alarm set;with call bell/phone within reach Nurse Communication: Mobility status PT Visit Diagnosis: Muscle weakness (generalized) (M62.81);Difficulty in walking, not elsewhere classified (R26.2);Pain Pain - Right/Left: Right Pain - part of body: Hip     Time: 8280-0349 PT Time Calculation (min) (ACUTE ONLY): 24 min  Charges:  $Gait Training: 8-22 mins $Therapeutic Exercise: 8-22 mins                     Kreg Shropshire, DPT 11/02/2018, 5:37 PM

## 2018-11-02 NOTE — Care Management Obs Status (Signed)
[facility] NOTIFICATION   Patient Details  Name: LEENA TIEDE MRN: [id number] Date of Birth: 1925/08/30   Medicare Observation Status Notification Given:  Yes    Ikhlas Albo A Jaquon Gingerich, RN 11/02/2018, 8:43 AM

## 2018-11-02 NOTE — Plan of Care (Signed)

## 2018-11-03 LAB — BASIC METABOLIC PANEL
Anion gap: 7 (ref 5–15)
BUN: 18 mg/dL (ref 8–23)
CO2: 21 mmol/L — ABNORMAL LOW (ref 22–32)
Calcium: 9 mg/dL (ref 8.9–10.3)
Chloride: 114 mmol/L — ABNORMAL HIGH (ref 98–111)
Creatinine, Ser: 0.74 mg/dL (ref 0.44–1.00)
GFR calc Af Amer: >60 mL/min (ref >60)
GFR calc non Af Amer: >60 mL/min (ref >60)
Glucose, Bld: 83 mg/dL (ref 70–99)
Potassium: 3.6 mmol/L (ref 3.5–5.1)
Sodium: 142 mmol/L (ref 135–145)

## 2018-11-03 LAB — MAGNESIUM: Magnesium: 2.1 mg/dL (ref 1.7–2.4)

## 2018-11-03 MED ORDER — DIPHENHYDRAMINE HCL 25 MG PO CAPS
25.0000 mg | ORAL_CAPSULE | Freq: Every evening | ORAL | Status: DC | PRN
  Administered 2018-11-03: 01:00:00 25 mg via ORAL
  Filled 2018-11-03: qty 1

## 2018-11-03 MED ORDER — HYDROMORPHONE HCL 2 MG PO TABS
4.0000 mg | ORAL_TABLET | ORAL | Status: DC | PRN
  Administered 2018-11-04 – 2018-11-05 (×3): 4 mg via ORAL
  Filled 2018-11-03 (×3): qty 2

## 2018-11-03 MED ORDER — DIAZEPAM 2 MG PO TABS
2.0000 mg | ORAL_TABLET | Freq: Four times a day (QID) | ORAL | Status: DC | PRN
  Administered 2018-11-04: 05:00:00 2 mg via ORAL
  Filled 2018-11-03: qty 1

## 2018-11-03 NOTE — Plan of Care (Signed)

## 2018-11-03 NOTE — Progress Notes (Signed)
[facility] [place] NAME: Semone Orlov    MR#:  [id number]  DATE OF BIRTH:  11-23-1925  SUBJECTIVE:  CHIEF COMPLAINT:   Chief Complaint  Patient presents with  . Fall   Sitting in a chair.  Continues to have pain right hip.  Afebrile.  No trouble breathing.  No other pain.  REVIEW OF SYSTEMS:  Review of Systems  Constitutional: Negative for chills and fever.  HENT: Negative for hearing loss and tinnitus.   Eyes: Negative for blurred vision and double vision.  Respiratory: Negative for cough and shortness of breath.   Cardiovascular: Negative for chest pain and palpitations.  Gastrointestinal: Negative for heartburn and nausea.  Genitourinary: Negative for dysuria and urgency.  Musculoskeletal: Negative for myalgias.       Right hip pain  Skin: Negative for itching and rash.  Neurological: Negative for dizziness and headaches.  Psychiatric/Behavioral: Negative for depression and hallucinations.    DRUG ALLERGIES:   Allergies  Allergen Reactions  . Codeine Itching and Nausea Only    Small amounts okay  . Pregabalin Other (See Comments)    Confusion and hallucinations  . Promethazine Hcl Other (See Comments)    Muscle cramps  . Sulfonamide Derivatives Nausea Only   VITALS:  Blood pressure (!) 140/37, pulse (!) 53, temperature 98.3 F (36.8 C), temperature source Oral, resp. rate 20, height 5\' 2"  (1.575 m), weight 54 kg, SpO2 96 %. PHYSICAL EXAMINATION:  Physical Exam  GENERAL:  85 y.o.-year-old patient lying in the bed moderate amount of pain.  EYES: Pupils equal, round, reactive to light and accommodation. No scleral icterus. Extraocular muscles intact.  HEENT: Head atraumatic, normocephalic. Oropharynx and nasopharynx clear.  NECK:  Supple, no jugular venous distention. No thyroid enlargement, no tenderness.  LUNGS: Normal breath sounds bilaterally, no wheezing, rales,rhonchi or crepitation. No use of accessory muscles  of respiration.  CARDIOVASCULAR: S1, S2 normal. No murmurs, rubs, or gallops.  ABDOMEN: Soft, nontender, nondistended. Bowel sounds present. No organomegaly or mass.  EXTREMITIES: Limitation of movement of right lower extremity due to severe right hip pains.  No edema.  No cyanosis.   MUSCULOSKELETAL: Normal bulk, Unable to assess due to significant amount of pain in the right lower extremity NEUROLOGIC:Alert and oriented x 3.  Sensation to light touch.gait not checked.   PSYCHIATRIC: The patient is alert and oriented x 3.  SKIN: No obvious rash, lesion, or ulcer.   LABORATORY PANEL:  Female CBC Recent Labs  Lab 11/02/18 0604  WBC 11.2*  HGB 10.8*  HCT 34.3*  PLT 406*   ------------------------------------------------------------------------------------------------------------------ Chemistries  Recent Labs  Lab 11/01/18 1133  11/03/18 0508  NA 140   < > 142  K 3.5   < > 3.6  CL 109   < > 114*  CO2 23   < > 21*  GLUCOSE 105*   < > 83  BUN 16   < > 18  CREATININE 0.83   < > 0.74  CALCIUM 9.8   < > 9.0  MG  --   --  2.1  AST 20  --   --   ALT 11  --   --   ALKPHOS 69  --   --   BILITOT 0.4  --   --    < > = values in this interval not displayed.   RADIOLOGY:  No results found. ASSESSMENT AND PLAN:   85 y.o. female with pertinent past medical  history of degenerative joint disease, GERD, OA, osteopenia, small bowel obstruction, COPD, bradycardia, and recent right total hip arthroplasty 10/04/2018 presenting to the ED from [facility] independent living with complaints of severe right hip pain post fall.  1. Severe right hip pain -patient with recent right total hip arthroplasty 10/04/2018 at [facility], pain now worse with fall. -Imaging studies including CT scan of the pelvis with no acute fractures. Evaluated by orthopedics.  Advised to manage symptomatically and continue physical therapy.  Appreciate input. Continue MS Contin and Dilaudid.  I have increased dose of oral  Dilaudid that patient takes at her request.  She seems to have high tolerance for narcotics. Continue physical therapy and Occupational Therapy.  Skilled nursing facility at discharge  2. Fall -due to weakness from prior surgery of the hip.  Will need therapy at SNF.  3. UTI On IV ceftriaxone  4. Chronic COPD -no evidence of exacerbation -Chest x-ray shows no active cardiopulmonary disease -DuoNebs PRN  5.  GERD -Protonix  6. DVT prophylaxis - Enoxaparin    All the records are reviewed and case discussed with Care Management/Social Worker. Management plans discussed with the patient, family and they are in agreement.  CODE STATUS: Full Code  TOTAL TIME TAKING CARE OF THIS PATIENT: 35 minutes.   POSSIBLE D/C IN 2 DAYS, DEPENDING ON CLINICAL CONDITION.  Neita Carp M.D on 11/03/2018 at 12:45 PM  Between 7am to 6pm - Pager - [phone]  After 6pm go to [email or web address] - Proofreader  [employer]  Hospitalists  Office  [phone]  CC: Primary care physician; Janith Lima, MD  Note: This dictation was prepared with Dragon dictation along with smaller phrase technology. Any transcriptional errors that result from this process are unintentional.

## 2018-11-03 NOTE — Progress Notes (Signed)
Physical Therapy Treatment Patient Details Name: [name] MRN: [id number] DOB: 1925-12-11 Today's Date: 11/03/2018    History of Present Illness 85 y/o female who had a R total hip replacement 1 month ago, had done relatively well walking >100 ft POD1 and for ~1 week was walking long distances each day - then, without know injury stated having severe pain with WBing and has been limping and struggling with severe pain for the last few weeks.  Pt finally suffered a fall and came to the ED.    PT Comments    Pt agreeable to PT; reports 10+ pain in RLE hip to knee along lateral thigh with some movements and weight bearing. Vitals checked prior to activity due to varied recent BPs. BP currently 150/64 seated. Pt continues to have difficulty mobilizing RLE in and out of bed with greater difficulty returning to bed Min and Mod A respectively.  Pt notes most pain with long lever movements against gravity. Pt participates in both seated and standing exercises; encouraged abdominal bracing and proper posture throughout. Rest breaks taken as needed. Educated pt on avoiding over work and inflammation. Continue PT to progress range and strength with decreased pain to improve functional mobility with appropriate weight bearing per ortho.    Follow Up Recommendations  SNF     Equipment Recommendations  None recommended by PT    Recommendations for Other Services       Precautions / Restrictions Precautions Precautions: Anterior Hip;Fall Restrictions Weight Bearing Restrictions: Yes Other Position/Activity Restrictions: Per ortho, minimal weightbearing    Mobility  Bed Mobility Overal bed mobility: Needs Assistance Bed Mobility: Supine to Sit;Sit to Supine     Supine to sit: Min assist;HOB elevated Sit to supine: Mod assist;HOB elevated(for LEs return to bed)   General bed mobility comments: Manages trunk well; primary difficulty is with return of RLE/BLE to bed  Transfers Overall  transfer level: Needs assistance Equipment used: Rolling walker (2 wheeled) Transfers: Sit to/from Stand Sit to Stand: Min assist         General transfer comment: slow to rise; self limits weight bearing on RLE  Ambulation/Gait                 Stairs             Wheelchair Mobility    Modified Rankin (Stroke Patients Only)       Balance Overall balance assessment: Needs assistance Sitting-balance support: Bilateral upper extremity supported;Feet supported Sitting balance-Leahy Scale: Good     Standing balance support: Bilateral upper extremity supported Standing balance-Leahy Scale: Fair                              Cognition Arousal/Alertness: Awake/alert Behavior During Therapy: WFL for tasks assessed/performed Overall Cognitive Status: Within Functional Limits for tasks assessed                                        Exercises General Exercises - Lower Extremity Quad Sets: Strengthening;Both;10 reps;Standing;Other (comment)(FF TDWB on R) Gluteal Sets: Strengthening;Both;10 reps;Standing;Other (comment)(FF TDWB on R) Long Arc Quad: AROM;Strengthening;Both;10 reps;Seated Hip ABduction/ADduction: Strengthening;Right;10 reps;Standing Straight Leg Raises: Strengthening;Right;10 reps;Standing Hip Flexion/Marching: AROM;Both;10 reps;Seated;Other (comment)(R standing 10 reps) Toe Raises: AROM;Both;10 reps;Seated Heel Raises: AROM;Both;10 reps;Seated Other Exercises Other Exercises: abdominal bracing and posture with exercises     General Comments  Pertinent Vitals/Pain Pain Assessment: 0-10 Pain Score: 10-Worst pain ever Pain Location: R hip to knee laterally with antigravity long lever movement Pain Descriptors / Indicators: Burning;Grimacing;Operative site guarding;Sharp;Shooting Pain Intervention(s): Limited activity within patient's tolerance;Monitored during session;Other (comment)(pt has heat pad?)     Home Living                      Prior Function            PT Goals (current goals can now be found in the care plan section) Progress towards PT goals: Progressing toward goals    Frequency    Min 2X/week      PT Plan Current plan remains appropriate    Co-evaluation              AM-PAC PT "6 Clicks" Mobility   Outcome Measure  Help needed turning from your back to your side while in a flat bed without using bedrails?: A Little Help needed moving from lying on your back to sitting on the side of a flat bed without using bedrails?: A Little Help needed moving to and from a bed to a chair (including a wheelchair)?: A Lot Help needed standing up from a chair using your arms (e.g., wheelchair or bedside chair)?: A Little Help needed to walk in hospital room?: A Lot Help needed climbing 3-5 steps with a railing? : Total 6 Click Score: 14    End of Session Equipment Utilized During Treatment: Gait belt Activity Tolerance: Patient tolerated treatment well;Patient limited by pain Patient left: in bed;with call bell/phone within reach;with bed alarm set Nurse Communication: Mobility status PT Visit Diagnosis: Muscle weakness (generalized) (M62.81);Difficulty in walking, not elsewhere classified (R26.2);Pain Pain - Right/Left: Right Pain - part of body: Hip     Time: 0623-7628 PT Time Calculation (min) (ACUTE ONLY): 32 min  Charges:  $Therapeutic Exercise: 8-22 mins $Therapeutic Activity: 8-22 mins                      Larae Grooms, PTA 11/03/2018, 1:32 PM

## 2018-11-04 MED ORDER — BISACODYL 10 MG RE SUPP
10.0000 mg | Freq: Once | RECTAL | Status: AC
  Administered 2018-11-04: 10 mg via RECTAL
  Filled 2018-11-04: qty 1

## 2018-11-04 MED ORDER — GABAPENTIN 100 MG PO CAPS
100.0000 mg | ORAL_CAPSULE | Freq: Once | ORAL | Status: AC
  Administered 2018-11-04: 11:00:00 100 mg via ORAL
  Filled 2018-11-04: qty 1

## 2018-11-04 MED ORDER — LORAZEPAM 1 MG PO TABS
1.0000 mg | ORAL_TABLET | Freq: Three times a day (TID) | ORAL | Status: DC | PRN
  Administered 2018-11-04: 13:00:00 1 mg via ORAL
  Filled 2018-11-04: qty 1

## 2018-11-04 MED ORDER — SODIUM CHLORIDE 0.9 % IV SOLN
INTRAVENOUS | Status: DC | PRN
  Administered 2018-11-04: 13:00:00 250 mL via INTRAVENOUS

## 2018-11-04 MED ORDER — AMITRIPTYLINE HCL 25 MG PO TABS
25.0000 mg | ORAL_TABLET | Freq: Every day | ORAL | Status: DC
  Administered 2018-11-04: 25 mg via ORAL
  Filled 2018-11-04: qty 1

## 2018-11-04 NOTE — Progress Notes (Signed)
[place] [facility] [place] NAME: [name]    MR#:  [id number]  DATE OF BIRTH:  1926/01/02  SUBJECTIVE:  CHIEF COMPLAINT:   Chief Complaint  Patient presents with  . Fall   Laying in bed. No pain at rest but severe pain when she moves her right lower extremity. Constipated  REVIEW OF SYSTEMS:  Review of Systems  Constitutional: Negative for chills and fever.  HENT: Negative for hearing loss and tinnitus.   Eyes: Negative for blurred vision and double vision.  Respiratory: Negative for cough and shortness of breath.   Cardiovascular: Negative for chest pain and palpitations.  Gastrointestinal: Negative for heartburn and nausea.  Genitourinary: Negative for dysuria and urgency.  Musculoskeletal: Negative for myalgias.       Right hip pain  Skin: Negative for itching and rash.  Neurological: Negative for dizziness and headaches.  Psychiatric/Behavioral: Negative for depression and hallucinations.    DRUG ALLERGIES:   Allergies  Allergen Reactions  . Codeine Itching and Nausea Only    Small amounts okay  . Pregabalin Other (See Comments)    Confusion and hallucinations  . Promethazine Hcl Other (See Comments)    Muscle cramps  . Sulfonamide Derivatives Nausea Only   VITALS:  Blood pressure (!) 131/49, pulse (!) 57, temperature 97.9 F (36.6 C), temperature source Oral, resp. rate 18, height 5\' 2"  (1.575 m), weight 54 kg, SpO2 97 %. PHYSICAL EXAMINATION:  Physical Exam  GENERAL:  85 y.o.-year-old patient lying in the bed moderate amount of pain.  EYES: Pupils equal, round, reactive to light and accommodation. No scleral icterus. Extraocular muscles intact.  HEENT: Head atraumatic, normocephalic. Oropharynx and nasopharynx clear.  NECK:  Supple, no jugular venous distention. No thyroid enlargement, no tenderness.  LUNGS: Normal breath sounds bilaterally, no wheezing, rales,rhonchi or crepitation. No use of accessory muscles  of respiration.  CARDIOVASCULAR: S1, S2 normal. No murmurs, rubs, or gallops.  ABDOMEN: Soft, nontender, nondistended. Bowel sounds present. No organomegaly or mass.  EXTREMITIES: Limitation of movement of right lower extremity due to severe right hip pains.  No edema.  No cyanosis.   MUSCULOSKELETAL: Normal bulk, Unable to assess due to significant amount of pain in the right lower extremity NEUROLOGIC:Alert and oriented x 3.  Sensation to light touch.gait not checked.   PSYCHIATRIC: The patient is alert and oriented x 3.  SKIN: No obvious rash, lesion, or ulcer.   LABORATORY PANEL:  Female CBC Recent Labs  Lab 11/02/18 0604  WBC 11.2*  HGB 10.8*  HCT 34.3*  PLT 406*   ------------------------------------------------------------------------------------------------------------------ Chemistries  Recent Labs  Lab 11/01/18 1133  11/03/18 0508  NA 140   < > 142  K 3.5   < > 3.6  CL 109   < > 114*  CO2 23   < > 21*  GLUCOSE 105*   < > 83  BUN 16   < > 18  CREATININE 0.83   < > 0.74  CALCIUM 9.8   < > 9.0  MG  --   --  2.1  AST 20  --   --   ALT 11  --   --   ALKPHOS 69  --   --   BILITOT 0.4  --   --    < > = values in this interval not displayed.   RADIOLOGY:  No results found. ASSESSMENT AND PLAN:   85 y.o. female with pertinent past medical history of degenerative  joint disease, GERD, OA, osteopenia, small bowel obstruction, COPD, bradycardia, and recent right total hip arthroplasty 10/04/2018 presenting to the ED from [facility] independent living with complaints of severe right hip pain post fall.  1. Severe right hip pain -patient with recent right total hip arthroplasty 10/04/2018 at [facility], pain now worse with fall. -Imaging studies including CT scan of the pelvis with no acute fractures. Evaluated by orthopedics.  Advised to manage symptomatically and continue physical therapy.  Appreciate input. Continue MS Contin and Dilaudid.  Will give 1 dose of Neurontin  and see if that helps. Continue physical therapy and Occupational Therapy.  Skilled nursing facility at discharge  2. Fall -due to weakness from prior surgery of the hip.  Will need therapy at SNF.  3. UTI On IV ceftriaxone  4. Chronic COPD -no evidence of exacerbation -Chest x-ray shows no active cardiopulmonary disease -DuoNebs PRN  5.  GERD -Protonix  6. DVT prophylaxis - Enoxaparin   7.  Constipation.  Order Dulcolax suppository.   All the records are reviewed and case discussed with Care Management/Social Worker. Management plans discussed with the patient, family and they are in agreement.  CODE STATUS: Full Code  TOTAL TIME TAKING CARE OF THIS PATIENT: 35 minutes.   POSSIBLE D/C IN 2 DAYS, DEPENDING ON CLINICAL CONDITION.  Neita Carp M.D on 11/04/2018 at 10:38 AM  Between 7am to 6pm - Pager - [phone]  After 6pm go to [email or web address] - Proofreader  [employer] [place] [facility]  Office  [phone]  CC: Primary care physician; Janith Lima, MD  Note: This dictation was prepared with Dragon dictation along with smaller phrase technology. Any transcriptional errors that result from this process are unintentional.

## 2018-11-04 NOTE — Plan of Care (Signed)
Dilaudid IV and PO given once each for R hip pain with improvement. Ativan given once for anxiety with good effect.  Dulcolax supp given. Pt had a BM.

## 2018-11-05 MED ORDER — MORPHINE SULFATE 15 MG PO TABS: 15.0000 mg | ORAL_TABLET | Freq: Four times a day (QID) | ORAL | 0 refills | Status: DC

## 2018-11-05 MED ORDER — ASPIRIN 81 MG PO CHEW: 81.0000 mg | CHEWABLE_TABLET | Freq: Every day | ORAL | 1 refills | Status: DC

## 2018-11-05 MED ORDER — HYDROMORPHONE HCL 2 MG PO TABS: 2.0000 mg | ORAL_TABLET | Freq: Four times a day (QID) | ORAL | 0 refills | Status: DC | PRN

## 2018-11-05 MED ORDER — AMITRIPTYLINE HCL 25 MG PO TABS: 25.0000 mg | ORAL_TABLET | Freq: Every day | ORAL | Status: DC

## 2018-11-05 MED ORDER — LORAZEPAM 0.5 MG PO TABS: 0.5000 mg | ORAL_TABLET | Freq: Two times a day (BID) | ORAL | 0 refills | Status: DC | PRN

## 2018-11-05 NOTE — Care Management Important Message (Signed)
Important Message  Patient Details  Name: [name] MRN: [id number] Date of Birth: Mar 03, 1926   Medicare Important Message Given:  Yes     Dannette Barbara 11/05/2018, 11:20 AM

## 2018-11-05 NOTE — Discharge Instructions (Signed)
Full weight bearing  Regular diet

## 2018-11-05 NOTE — Progress Notes (Signed)
Physical Therapy Treatment Patient Details Name: [name] MRN: [id number] DOB: June 16, 1925 Today's Date: 11/05/2018    History of Present Illness 85 y/o female who had a R total hip replacement 1 month ago, had done relatively well walking >100 ft POD1 and for ~1 week was walking long distances each day - then, without know injury stated having severe pain with WBing and has been limping and struggling with severe pain for the last few weeks.  Pt finally suffered a fall and came to the ED.    PT Comments    Pt agreeable to PT; reports 5/10 pain R hip/lateral thigh. Pt wishes to remain in bed for exercises, as she notes she was just out of bed for [facility] use and bath. Pt participates well with supine bed exercises with assist as needed. Pt displays some grimacing/moaning due to pain in R at times; encouraged to perform these exercises with some assist to avoid overuse/strain and manage pain. Improved comfort and overall performance with assist as needed. Per SW during session, pt to be discharged today to skilled nursing facility to continue rehab efforts.    Follow Up Recommendations  SNF     Equipment Recommendations  None recommended by PT    Recommendations for Other Services       Precautions / Restrictions Precautions Precautions: Anterior Hip;Fall Restrictions Weight Bearing Restrictions: Yes Other Position/Activity Restrictions: Per ortho, minimal weightbearing    Mobility  Bed Mobility               General bed mobility comments: Not tested; pt notes recently finishing being up on BSC/bathing and wishes to remain in bed. Noted improved ability to partial bridge and mobilize hips in bed  Transfers                    Ambulation/Gait                 Stairs             Wheelchair Mobility    Modified Rankin (Stroke Patients Only)       Balance                                            Cognition  Arousal/Alertness: Awake/alert Behavior During Therapy: WFL for tasks assessed/performed Overall Cognitive Status: Within Functional Limits for tasks assessed                                        Exercises General Exercises - Lower Extremity Ankle Circles/Pumps: AROM;Both;20 reps Quad Sets: Strengthening;Both;20 reps Gluteal Sets: Strengthening;Both;20 reps Short Arc Quad: AROM;Strengthening;Both;20 reps Heel Slides: AROM;Both;20 reps;AAROM(Assist on R) Hip ABduction/ADduction: AAROM;Both;20 reps(AROM on R painful; encouraged AAROM for now) Straight Leg Raises: AAROM;Both;Strengthening;10 reps(AAROM on R; strengthening L; 2 sets each)    General Comments        Pertinent Vitals/Pain Pain Assessment: 0-10 Pain Score: 5  Pain Location: R hip to knee laterally with antigravity long lever movement Pain Descriptors / Indicators: Moaning;Grimacing    Home Living                      Prior Function            PT Goals (current goals can now  be found in the care plan section) Progress towards PT goals: Progressing toward goals(slowly)    Frequency    Min 2X/week      PT Plan Current plan remains appropriate    Co-evaluation              AM-PAC PT "6 Clicks" Mobility   Outcome Measure  Help needed turning from your back to your side while in a flat bed without using bedrails?: A Little Help needed moving from lying on your back to sitting on the side of a flat bed without using bedrails?: A Little Help needed moving to and from a bed to a chair (including a wheelchair)?: A Lot Help needed standing up from a chair using your arms (e.g., wheelchair or bedside chair)?: A Little Help needed to walk in hospital room?: A Little Help needed climbing 3-5 steps with a railing? : Total 6 Click Score: 15    End of Session   Activity Tolerance: Patient tolerated treatment well;Patient limited by pain Patient left: in bed;with call bell/phone  within reach;with bed alarm set   PT Visit Diagnosis: Muscle weakness (generalized) (M62.81);Difficulty in walking, not elsewhere classified (R26.2);Pain Pain - Right/Left: Right Pain - part of body: Hip     Time: 1130-1155 PT Time Calculation (min) (ACUTE ONLY): 25 min  Charges:  $Therapeutic Exercise: 23-37 mins                      Larae Grooms, PTA 11/05/2018, 11:59 AM

## 2018-11-05 NOTE — TOC Progression Note (Signed)
Transition of Care [facility]) - Progression Note    Patient Details  Name: [name] MRN: [id number] Date of Birth: Jul 21, 1925  [facility] CM/SW Contact  Shelbie Hutching, RN Phone Number: 11/05/2018, 3:46 PM  Clinical Narrative:    Patient verbalizes understanding that Medicare will not pay for SNF.  Patient reports that she will be able to pay out of pocket.  Patient will discharge to the [place] [place] [facility] via EMS.  Patient has spoken with the business office about cost of stay.    Expected Discharge Plan: Skilled Nursing Facility Barriers to Discharge: Barriers Resolved  Expected Discharge Plan and Services Expected Discharge Plan: [place]   Discharge Planning Services: CM Consult Post Acute Care Choice: [place] Living arrangements for the past 2 months: [place] Expected Discharge Date: 11/05/18                                     Social Determinants of Health (SDOH) Interventions    Readmission Risk Interventions No flowsheet data found.

## 2018-11-05 NOTE — Discharge Summary (Addendum)
[facility] NAME: [name]    MR#:  [id number]  DATE OF BIRTH:  [birth year]  DATE OF ADMISSION:  11/01/2018 ADMITTING PHYSICIAN: Lang Snow, NP  DATE OF DISCHARGE: 11/05/2018  PRIMARY CARE PHYSICIAN: Janith Lima, MD   ADMISSION DIAGNOSIS:  Right hip pain [M25.551] Inability to bear weight [R26.89] Acute cystitis without hematuria [N30.00] UTI (urinary tract infection) [N39.0]  DISCHARGE DIAGNOSIS:  Active Problems:   UTI (urinary tract infection)   SECONDARY DIAGNOSIS:   Past Medical History:  Diagnosis Date  . Adenomatous colon polyp 04/26/95   tubulovillous  . Allergy   . Arthritis   . Bradycardia   . Bursitis   . Cataracts, bilateral   . Constipation   . COPD, moderate ([place])    "THIS WAS TOLD TO ME BACK IN THE DAYS WHEN EVERYONE THAT WAS  A SMOKER WAS TOLD THEY WERE A COPD. IM 90 AND I DONT GET SHORT OF BREATH , I DONT HAVE IT "  . Cystitis   . Degenerative joint disease   . Depression    DENIES   . Gastritis   . GERD (gastroesophageal reflux disease)   . Glaucoma   . Hypoglycemia   . Impaired memory   . Insomnia   . Lack of bladder control   . Macular degeneration   . OA (osteoarthritis)   . Osteopenia   . Renal cyst    right  . Shortness of breath   . Small bowel obstruction ([place])   . Trigeminal neuralgia    DENIES   . Urinary incontinence   . Wears dentures      ADMITTING HISTORY  HISTORY OF PRESENT ILLNESS:  85 y.o. female with pertinent past medical history of degenerative joint disease, GERD, OA, osteopenia, small bowel obstruction, COPD, bradycardia, and recent right total hip arthroplasty 10/04/2018 presenting to the ED from [facility] independent living with complaints of severe right hip pain post fall.  Patient reports that she recently had right hip replacement and has been doing well 1 week after the surgery.  She reports that after 1 week she has had right hip pain and  muscle spasm causing weakness.  Patient states she was walking to the bathroom last night and her leg/hip just gave out and she fell.  She denies hitting her head or LOC she however states that she hit her right hip.  She is currently unable to bear weight or ambulate due to significant pain in her right hip she is also having recurrent muscle spasm was with movement or repositioning.  She was given 75 mcg of fentanyl and 4 g of Zofran by EMS.  On arrival to the ED, she was afebrile with blood pressure 156/53 mm Hg and pulse rate 67 beats/min. There were no focal neurological deficits; he was alert and oriented x4, and she did not demonstrate any memory deficits.  Initial labs revealed WBC 11.9, unremarkable CMP, urinalysis showed positive nitrites, small leukocytes and many bacteria.  X-ray of right hip showed no evidence of acute abnormality.  Chest x-ray showed no active cardiopulmonary disease.  CT pelvis shows no fracture or dislocation.  She has continued to complain of severe right hip pain following fall.  Patient will be admitted under hospitalist service for further management.   HOSPITAL COURSE:   85 y.o.femalewith pertinent past medical historyof degenerative joint disease, GERD, OA, osteopenia, small bowel obstruction, COPD, bradycardia, and recent right total hip arthroplasty6/11/2020presenting to  the ED from [facility] independent living with complaints of severe right hip pain post fall.  1.Severe right hip pain-patient with recent right total hip arthroplasty6/02/2019 at [facility] now worse with fall. -Imaging studies including CT scan of the pelvis with no acute fractures. Evaluated by orthopedics.  Advised to manage symptomatically and continue physical therapy.  Appreciate input. Continue MS Contin and Dilaudid.  Will give 1 dose of Neurontin and see if that helps. Continue physical therapy and Occupational Therapy.  Skilled nursing facility at discharge. Follow with  her orthopedic Dr. Delfino Lovett in   2.Fall-due to weakness from prior surgery of the hip.  Will need therapy at SNF.  3.UTI On IV ceftriaxone and finished course in hospital.  4.Chronic COPD-no evidence of exacerbation -Chest x-ray shows no active cardiopulmonary disease -DuoNebs PRN  5.GERD -Protonix  6.DVT prophylaxis - Enoxaparin   7.  Constipation. Resolved  High doses of narcotic use prior to admission. Goes to pain clinic.  Stable for discharge to SNF   CONSULTS OBTAINED:  Treatment Team:  Earnestine Leys, MD  DRUG ALLERGIES:   Allergies  Allergen Reactions  . Codeine Itching and Nausea Only    Small amounts okay  . Pregabalin Other (See Comments)    Confusion and hallucinations  . Promethazine Hcl Other (See Comments)    Muscle cramps  . Sulfonamide Derivatives Nausea Only    DISCHARGE MEDICATIONS:   Allergies as of 11/05/2018      Reactions   Codeine Itching, Nausea Only   Small amounts okay   Pregabalin Other (See Comments)   Confusion and hallucinations   Promethazine Hcl Other (See Comments)   Muscle cramps   Sulfonamide Derivatives Nausea Only      Medication List    TAKE these medications   acetaminophen 325 MG tablet Commonly known as: TYLENOL Take 1-2 tablets (325-650 mg total) by mouth every 6 (six) hours as needed for mild pain (pain score 1-3 or temp > 100.5).   amitriptyline 25 MG tablet Commonly known as: ELAVIL Take 1 tablet (25 mg total) by mouth at bedtime.   aspirin 81 MG chewable tablet Chew 1 tablet (81 mg total) by mouth daily. What changed: when to take this   beta carotene w/minerals tablet Take 1 tablet by mouth 2 (two) times daily.   bisacodyl 5 MG EC tablet Commonly known as: DULCOLAX Take 5 mg by mouth daily as needed for moderate constipation.   bisacodyl 10 MG suppository Commonly known as: DULCOLAX Place 10 mg rectally daily as needed for moderate constipation.   docusate sodium 100 MG  capsule Commonly known as: COLACE Take 1 capsule (100 mg total) by mouth 2 (two) times daily.   HYDROmorphone 2 MG tablet Commonly known as: DILAUDID Take 1 tablet (2 mg total) by mouth 4 (four) times daily as needed. What changed: reasons to take this   LORazepam 0.5 MG tablet Commonly known as: ATIVAN Take 1 tablet (0.5 mg total) by mouth 2 (two) times daily as needed for anxiety or sleep.   morphine 15 MG tablet Commonly known as: MSIR Take 1 tablet (15 mg total) by mouth 4 (four) times daily.   omeprazole 20 MG capsule Commonly known as: PRILOSEC Take 20 mg by mouth daily before breakfast.   ondansetron 4 MG tablet Commonly known as: ZOFRAN Take 1 tablet (4 mg total) by mouth every 6 (six) hours as needed for nausea.   polyethylene glycol 17 g packet Commonly known as: MIRALAX / GLYCOLAX Take 17 g  by mouth daily as needed for moderate constipation.   senna 8.6 MG Tabs tablet Commonly known as: SENOKOT Take 1 tablet (8.6 mg total) by mouth 2 (two) times daily.   Travatan Z 0.004 % Soln ophthalmic solution Generic drug: Travoprost (BAK Free) Place 1 drop into both eyes at bedtime.       Today   VITAL SIGNS:  Blood pressure (!) 176/57, pulse 63, temperature 97.7 F (36.5 C), temperature source Oral, resp. rate 18, height 5\' 2"  (1.575 m), weight 54 kg, SpO2 98 %.  I/O:    Intake/Output Summary (Last 24 hours) at 11/05/2018 1143 Last data filed at 11/05/2018 0900 Gross per 24 hour  Intake 342.96 ml  Output -  Net 342.96 ml    PHYSICAL EXAMINATION:  Physical Exam  GENERAL:  85 y.o.-year-old patient lying in the bed with no acute distress.  LUNGS: Normal breath sounds bilaterally, no wheezing, rales,rhonchi or crepitation. No use of accessory muscles of respiration.  CARDIOVASCULAR: S1, S2 normal. No murmurs, rubs, or gallops.  ABDOMEN: Soft, non-tender, non-distended. Bowel sounds present. No organomegaly or mass.  NEUROLOGIC: Moves all 4  extremities. PSYCHIATRIC: The patient is alert and oriented x 3.  SKIN: No obvious rash, lesion, or ulcer.   DATA REVIEW:   CBC Recent Labs  Lab 11/02/18 0604  WBC 11.2*  HGB 10.8*  HCT 34.3*  PLT 406*    Chemistries  Recent Labs  Lab 11/01/18 1133  11/03/18 0508  NA 140   < > 142  K 3.5   < > 3.6  CL 109   < > 114*  CO2 23   < > 21*  GLUCOSE 105*   < > 83  BUN 16   < > 18  CREATININE 0.83   < > 0.74  CALCIUM 9.8   < > 9.0  MG  --   --  2.1  AST 20  --   --   ALT 11  --   --   ALKPHOS 69  --   --   BILITOT 0.4  --   --    < > = values in this interval not displayed.    Cardiac Enzymes No results for input(s): TROPONINI in the last 168 hours.  Microbiology Results  Results for orders placed or performed during the hospital encounter of 11/01/18  SARS Coronavirus 2 (CEPHEID - Performed in [facility] hospital lab), Hosp Order     Status: None   Collection Time: 11/01/18  5:25 PM   Specimen: Nasopharyngeal Swab  Result Value Ref Range Status   SARS Coronavirus 2 NEGATIVE NEGATIVE Final    Comment: (NOTE) If result is NEGATIVE SARS-CoV-2 target nucleic acids are NOT DETECTED. The SARS-CoV-2 RNA is generally detectable in upper and lower  respiratory specimens during the acute phase of infection. The lowest  concentration of SARS-CoV-2 viral copies this assay can detect is 250  copies / mL. A negative result does not preclude SARS-CoV-2 infection  and should not be used as the sole basis for treatment or other  patient management decisions.  A negative result may occur with  improper specimen collection / handling, submission of specimen other  than nasopharyngeal swab, presence of viral mutation(s) within the  areas targeted by this assay, and inadequate number of viral copies  (<250 copies / mL). A negative result must be combined with clinical  observations, patient history, and epidemiological information. If result is POSITIVE SARS-CoV-2 target nucleic  acids are DETECTED. The  SARS-CoV-2 RNA is generally detectable in upper and lower  respiratory specimens dur ing the acute phase of infection.  Positive  results are indicative of active infection with SARS-CoV-2.  Clinical  correlation with patient history and other diagnostic information is  necessary to determine patient infection status.  Positive results do  not rule out bacterial infection or co-infection with other viruses. If result is PRESUMPTIVE POSTIVE SARS-CoV-2 nucleic acids MAY BE PRESENT.   A presumptive positive result was obtained on the submitted specimen  and confirmed on repeat testing.  While 2019 novel coronavirus  (SARS-CoV-2) nucleic acids may be present in the submitted sample  additional confirmatory testing may be necessary for epidemiological  and / or clinical management purposes  to differentiate between  SARS-CoV-2 and other Sarbecovirus currently known to infect humans.  If clinically indicated additional testing with an alternate test  methodology [phone]) is advised. The SARS-CoV-2 RNA is generally  detectable in upper and lower respiratory sp ecimens during the acute  phase of infection. The expected result is Negative. Fact Sheet for Patients:  StrictlyIdeas.no Fact Sheet for Healthcare Providers: BankingDealers.co.za This test is not yet approved or cleared by the [place] FDA and has been authorized for detection and/or diagnosis of SARS-CoV-2 by FDA under an Emergency Use Authorization (EUA).  This EUA will remain in effect (meaning this test can be used) for the duration of the COVID-19 declaration under Section 564(b)(1) of the Act, 21 U.S.C. section 360bbb-3(b)(1), unless the authorization is terminated or revoked sooner. Performed at [facility], [address] [place], [place] [address]   MRSA PCR Screening     Status: None   Collection Time: 11/02/18  2:47 AM   Specimen:  Nasopharyngeal  Result Value Ref Range Status   MRSA by PCR NEGATIVE NEGATIVE Final    Comment:        The GeneXpert MRSA Assay (FDA approved for NASAL specimens only), is one component of a comprehensive MRSA colonization surveillance program. It is not intended to diagnose MRSA infection nor to guide or monitor treatment for MRSA infections. Performed at [facility], [address], [place], [place] [address]     RADIOLOGY:  No results found.  Follow up with PCP in 1 week.  Management plans discussed with the patient, family and they are in agreement.  CODE STATUS:     Code Status Orders  (From admission, onward)         Start     Ordered   11/01/18 2050  Full code  Continuous     11/01/18 2049        Code Status History    Date Active Date Inactive Code Status Order ID Comments User Context   10/04/2018 1504 10/05/2018 1820 Full Code [id number]  Rod Can, MD Inpatient   02/07/2016 0021 02/10/2016 1700 DNR [id number]  Edwin Dada, MD Inpatient   Advance Care Planning Activity    Advance Directive Documentation     Most Recent Value  Type of Advance Directive  Healthcare Power of [place], Living will  Pre-existing out of facility DNR order (yellow form or pink MOST form)  -  "MOST" Form in Place?  -      TOTAL TIME TAKING CARE OF THIS PATIENT ON DAY OF DISCHARGE: more than 30 minutes.   Neita Carp M.D on 11/05/2018 at 11:43 AM  Between 7am to 6pm - Pager - [phone]  After 6pm go to [email or web address] - [place]  [facility]  Office  [phone]  CC: Primary care physician; Janith Lima, MD  Note: This dictation was prepared with Dragon dictation along with smaller phrase technology. Any transcriptional errors that result from this process are unintentional.

## 2018-11-05 NOTE — Progress Notes (Signed)
Pt is being discharged to the [facility]. AVS given and explained to pt. Pt verbalized understanding. 2nd copy of AVS placed in discharge packet for receiving facility.  Called report to Demetrios Loll, LPN.  Awaiting EMS.

## 2018-11-05 NOTE — TOC Progression Note (Signed)
Transition of Care [facility]) - Progression Note    Patient Details  Name: [name] MRN: [id number] Date of Birth: 07-20-1925  [facility]) CM/SW Contact  Shelbie Hutching, RN Phone Number: 11/05/2018, 2:03 PM  Clinical Narrative:    RNCM just received notice from [place] that Medicare will not pay for the SNF stay, patient will have to self pay.  Patient reports that she can self pay but she wants to speak with someone in the business office about the cost.   Expected Discharge Plan: Skilled Nursing Facility Barriers to Discharge: Barriers Resolved  Expected Discharge Plan and Services Expected Discharge Plan: [place]   Discharge Planning Services: CM Consult Post Acute Care Choice: [place] Living arrangements for the past 2 months: [place] Expected Discharge Date: 11/05/18                                     Social Determinants of Health (SDOH) Interventions    Readmission Risk Interventions No flowsheet data found.

## 2018-11-05 NOTE — TOC Transition Note (Signed)
Transition of Care [facility]) - CM/SW Discharge Note   Patient Details  Name: [name] MRN: [id number] Date of Birth: [birth year]  [facility] CM/SW Contact:  Shelbie Hutching, RN Phone Number: 11/05/2018, 11:51 AM   Clinical Narrative:    Patient is ready for discharge and has a bed over at the [facility].  Patient is going to Rm 333, bedside RN will call report to [phone].  Patient will transport via EMS.  Attempted to call patient's sister to notify of discharge but line is busy.  RNCM will attempt to call sister again later today.     Final next level of care: Skilled Nursing Facility Barriers to Discharge: Barriers Resolved   Patient Goals and CMS Choice Patient states their goals for this hospitalization and ongoing recovery are:: "To get better"      Discharge Placement              Patient [facility] bed at: [facility] Patient to be transferred to facility by: [facility] Name of family member notified: [name]- sister Patient and family notified of of transfer: 11/05/18  Discharge Plan and Services   Discharge Planning Services: CM Consult Post Acute Care Choice: [place]                               Social Determinants of Health (SDOH) Interventions     Readmission Risk Interventions No flowsheet data found.

## 2018-11-06 ENCOUNTER — Telehealth: Payer: Self-pay | Admitting: *Deleted

## 2018-11-06 DIAGNOSIS — M545 Low back pain: Secondary | ICD-10-CM | POA: Diagnosis not present

## 2018-11-06 DIAGNOSIS — R2689 Other abnormalities of gait and mobility: Secondary | ICD-10-CM | POA: Diagnosis not present

## 2018-11-06 DIAGNOSIS — M25551 Pain in right hip: Secondary | ICD-10-CM | POA: Diagnosis not present

## 2018-11-06 DIAGNOSIS — H3530 Unspecified macular degeneration: Secondary | ICD-10-CM | POA: Diagnosis not present

## 2018-11-06 DIAGNOSIS — R41841 Cognitive communication deficit: Secondary | ICD-10-CM | POA: Diagnosis not present

## 2018-11-06 DIAGNOSIS — Z9181 History of falling: Secondary | ICD-10-CM | POA: Diagnosis not present

## 2018-11-06 DIAGNOSIS — F329 Major depressive disorder, single episode, unspecified: Secondary | ICD-10-CM | POA: Diagnosis not present

## 2018-11-06 DIAGNOSIS — Z87891 Personal history of nicotine dependence: Secondary | ICD-10-CM | POA: Diagnosis not present

## 2018-11-06 DIAGNOSIS — R1312 Dysphagia, oropharyngeal phase: Secondary | ICD-10-CM | POA: Diagnosis not present

## 2018-11-06 DIAGNOSIS — G8929 Other chronic pain: Secondary | ICD-10-CM | POA: Diagnosis not present

## 2018-11-06 DIAGNOSIS — Z96641 Presence of right artificial hip joint: Secondary | ICD-10-CM | POA: Diagnosis not present

## 2018-11-06 DIAGNOSIS — J449 Chronic obstructive pulmonary disease, unspecified: Secondary | ICD-10-CM | POA: Diagnosis not present

## 2018-11-06 DIAGNOSIS — M5416 Radiculopathy, lumbar region: Secondary | ICD-10-CM | POA: Diagnosis not present

## 2018-11-06 DIAGNOSIS — K219 Gastro-esophageal reflux disease without esophagitis: Secondary | ICD-10-CM | POA: Diagnosis not present

## 2018-11-06 DIAGNOSIS — H409 Unspecified glaucoma: Secondary | ICD-10-CM | POA: Diagnosis not present

## 2018-11-06 DIAGNOSIS — M6281 Muscle weakness (generalized): Secondary | ICD-10-CM | POA: Diagnosis not present

## 2018-11-06 DIAGNOSIS — M1990 Unspecified osteoarthritis, unspecified site: Secondary | ICD-10-CM | POA: Diagnosis not present

## 2018-11-06 NOTE — Telephone Encounter (Signed)
Pt was on TCM report admitted 11/01/18 for severe right hip pain post fall. Pt recently had a right total hip arthroplasty done on 6/11/2020and was doing well. Patient states she was walking to the bathroom, and her leg/hip just gave out and she fell. CT scan of the pelvis was done w/ no acute fractures. Pt also had UTI. Pt was discharge  11/05/18 to SNF, and will follow=up w/Dr Swintek in 2 weeks.Marland KitchenJohny Chess

## 2018-11-07 DIAGNOSIS — R1312 Dysphagia, oropharyngeal phase: Secondary | ICD-10-CM | POA: Diagnosis not present

## 2018-11-07 DIAGNOSIS — R41841 Cognitive communication deficit: Secondary | ICD-10-CM | POA: Diagnosis not present

## 2018-11-07 DIAGNOSIS — M6281 Muscle weakness (generalized): Secondary | ICD-10-CM | POA: Diagnosis not present

## 2018-11-07 DIAGNOSIS — R2689 Other abnormalities of gait and mobility: Secondary | ICD-10-CM | POA: Diagnosis not present

## 2018-11-07 DIAGNOSIS — Z9181 History of falling: Secondary | ICD-10-CM | POA: Diagnosis not present

## 2018-11-07 DIAGNOSIS — M5416 Radiculopathy, lumbar region: Secondary | ICD-10-CM | POA: Diagnosis not present

## 2018-11-08 DIAGNOSIS — R1312 Dysphagia, oropharyngeal phase: Secondary | ICD-10-CM | POA: Diagnosis not present

## 2018-11-08 DIAGNOSIS — M5416 Radiculopathy, lumbar region: Secondary | ICD-10-CM | POA: Diagnosis not present

## 2018-11-08 DIAGNOSIS — R2689 Other abnormalities of gait and mobility: Secondary | ICD-10-CM | POA: Diagnosis not present

## 2018-11-08 DIAGNOSIS — R41841 Cognitive communication deficit: Secondary | ICD-10-CM | POA: Diagnosis not present

## 2018-11-08 DIAGNOSIS — Z9181 History of falling: Secondary | ICD-10-CM | POA: Diagnosis not present

## 2018-11-08 DIAGNOSIS — M6281 Muscle weakness (generalized): Secondary | ICD-10-CM | POA: Diagnosis not present

## 2018-11-09 DIAGNOSIS — M6281 Muscle weakness (generalized): Secondary | ICD-10-CM | POA: Diagnosis not present

## 2018-11-09 DIAGNOSIS — R41841 Cognitive communication deficit: Secondary | ICD-10-CM | POA: Diagnosis not present

## 2018-11-09 DIAGNOSIS — R2689 Other abnormalities of gait and mobility: Secondary | ICD-10-CM | POA: Diagnosis not present

## 2018-11-09 DIAGNOSIS — R1312 Dysphagia, oropharyngeal phase: Secondary | ICD-10-CM | POA: Diagnosis not present

## 2018-11-09 DIAGNOSIS — Z9181 History of falling: Secondary | ICD-10-CM | POA: Diagnosis not present

## 2018-11-09 DIAGNOSIS — M5416 Radiculopathy, lumbar region: Secondary | ICD-10-CM | POA: Diagnosis not present

## 2018-11-12 DIAGNOSIS — Z9181 History of falling: Secondary | ICD-10-CM | POA: Diagnosis not present

## 2018-11-12 DIAGNOSIS — M6281 Muscle weakness (generalized): Secondary | ICD-10-CM | POA: Diagnosis not present

## 2018-11-12 DIAGNOSIS — R2689 Other abnormalities of gait and mobility: Secondary | ICD-10-CM | POA: Diagnosis not present

## 2018-11-12 DIAGNOSIS — R41841 Cognitive communication deficit: Secondary | ICD-10-CM | POA: Diagnosis not present

## 2018-11-12 DIAGNOSIS — M5416 Radiculopathy, lumbar region: Secondary | ICD-10-CM | POA: Diagnosis not present

## 2018-11-12 DIAGNOSIS — R1312 Dysphagia, oropharyngeal phase: Secondary | ICD-10-CM | POA: Diagnosis not present

## 2018-11-13 DIAGNOSIS — M6281 Muscle weakness (generalized): Secondary | ICD-10-CM | POA: Diagnosis not present

## 2018-11-13 DIAGNOSIS — R41841 Cognitive communication deficit: Secondary | ICD-10-CM | POA: Diagnosis not present

## 2018-11-13 DIAGNOSIS — R2689 Other abnormalities of gait and mobility: Secondary | ICD-10-CM | POA: Diagnosis not present

## 2018-11-13 DIAGNOSIS — R1312 Dysphagia, oropharyngeal phase: Secondary | ICD-10-CM | POA: Diagnosis not present

## 2018-11-13 DIAGNOSIS — M5416 Radiculopathy, lumbar region: Secondary | ICD-10-CM | POA: Diagnosis not present

## 2018-11-13 DIAGNOSIS — Z9181 History of falling: Secondary | ICD-10-CM | POA: Diagnosis not present

## 2018-11-14 DIAGNOSIS — M6281 Muscle weakness (generalized): Secondary | ICD-10-CM | POA: Diagnosis not present

## 2018-11-14 DIAGNOSIS — Z9181 History of falling: Secondary | ICD-10-CM | POA: Diagnosis not present

## 2018-11-14 DIAGNOSIS — R1312 Dysphagia, oropharyngeal phase: Secondary | ICD-10-CM | POA: Diagnosis not present

## 2018-11-14 DIAGNOSIS — M5416 Radiculopathy, lumbar region: Secondary | ICD-10-CM | POA: Diagnosis not present

## 2018-11-14 DIAGNOSIS — R41841 Cognitive communication deficit: Secondary | ICD-10-CM | POA: Diagnosis not present

## 2018-11-14 DIAGNOSIS — R2689 Other abnormalities of gait and mobility: Secondary | ICD-10-CM | POA: Diagnosis not present

## 2018-11-15 DIAGNOSIS — R41841 Cognitive communication deficit: Secondary | ICD-10-CM | POA: Diagnosis not present

## 2018-11-15 DIAGNOSIS — R2689 Other abnormalities of gait and mobility: Secondary | ICD-10-CM | POA: Diagnosis not present

## 2018-11-15 DIAGNOSIS — Z9181 History of falling: Secondary | ICD-10-CM | POA: Diagnosis not present

## 2018-11-15 DIAGNOSIS — R1312 Dysphagia, oropharyngeal phase: Secondary | ICD-10-CM | POA: Diagnosis not present

## 2018-11-15 DIAGNOSIS — M5416 Radiculopathy, lumbar region: Secondary | ICD-10-CM | POA: Diagnosis not present

## 2018-11-15 DIAGNOSIS — M6281 Muscle weakness (generalized): Secondary | ICD-10-CM | POA: Diagnosis not present

## 2018-11-16 DIAGNOSIS — R2689 Other abnormalities of gait and mobility: Secondary | ICD-10-CM | POA: Diagnosis not present

## 2018-11-16 DIAGNOSIS — M6281 Muscle weakness (generalized): Secondary | ICD-10-CM | POA: Diagnosis not present

## 2018-11-16 DIAGNOSIS — Z20828 Contact with and (suspected) exposure to other viral communicable diseases: Secondary | ICD-10-CM | POA: Diagnosis not present

## 2018-11-16 DIAGNOSIS — M5416 Radiculopathy, lumbar region: Secondary | ICD-10-CM | POA: Diagnosis not present

## 2018-11-16 DIAGNOSIS — Z9181 History of falling: Secondary | ICD-10-CM | POA: Diagnosis not present

## 2018-11-16 DIAGNOSIS — R1312 Dysphagia, oropharyngeal phase: Secondary | ICD-10-CM | POA: Diagnosis not present

## 2018-11-16 DIAGNOSIS — R41841 Cognitive communication deficit: Secondary | ICD-10-CM | POA: Diagnosis not present

## 2018-11-19 DIAGNOSIS — M5416 Radiculopathy, lumbar region: Secondary | ICD-10-CM | POA: Diagnosis not present

## 2018-11-19 DIAGNOSIS — R1312 Dysphagia, oropharyngeal phase: Secondary | ICD-10-CM | POA: Diagnosis not present

## 2018-11-19 DIAGNOSIS — M6281 Muscle weakness (generalized): Secondary | ICD-10-CM | POA: Diagnosis not present

## 2018-11-19 DIAGNOSIS — R41841 Cognitive communication deficit: Secondary | ICD-10-CM | POA: Diagnosis not present

## 2018-11-19 DIAGNOSIS — R2689 Other abnormalities of gait and mobility: Secondary | ICD-10-CM | POA: Diagnosis not present

## 2018-11-19 DIAGNOSIS — Z9181 History of falling: Secondary | ICD-10-CM | POA: Diagnosis not present

## 2018-11-20 DIAGNOSIS — R41841 Cognitive communication deficit: Secondary | ICD-10-CM | POA: Diagnosis not present

## 2018-11-20 DIAGNOSIS — M6281 Muscle weakness (generalized): Secondary | ICD-10-CM | POA: Diagnosis not present

## 2018-11-20 DIAGNOSIS — R1312 Dysphagia, oropharyngeal phase: Secondary | ICD-10-CM | POA: Diagnosis not present

## 2018-11-20 DIAGNOSIS — M5416 Radiculopathy, lumbar region: Secondary | ICD-10-CM | POA: Diagnosis not present

## 2018-11-20 DIAGNOSIS — R2689 Other abnormalities of gait and mobility: Secondary | ICD-10-CM | POA: Diagnosis not present

## 2018-11-20 DIAGNOSIS — Z471 Aftercare following joint replacement surgery: Secondary | ICD-10-CM | POA: Diagnosis not present

## 2018-11-20 DIAGNOSIS — Z9181 History of falling: Secondary | ICD-10-CM | POA: Diagnosis not present

## 2018-11-20 DIAGNOSIS — Z96641 Presence of right artificial hip joint: Secondary | ICD-10-CM | POA: Diagnosis not present

## 2018-11-21 ENCOUNTER — Other Ambulatory Visit: Payer: Self-pay | Admitting: Orthopedic Surgery

## 2018-11-21 DIAGNOSIS — M6281 Muscle weakness (generalized): Secondary | ICD-10-CM | POA: Diagnosis not present

## 2018-11-21 DIAGNOSIS — Z471 Aftercare following joint replacement surgery: Secondary | ICD-10-CM

## 2018-11-21 DIAGNOSIS — M5416 Radiculopathy, lumbar region: Secondary | ICD-10-CM | POA: Diagnosis not present

## 2018-11-21 DIAGNOSIS — R41841 Cognitive communication deficit: Secondary | ICD-10-CM | POA: Diagnosis not present

## 2018-11-21 DIAGNOSIS — R2689 Other abnormalities of gait and mobility: Secondary | ICD-10-CM | POA: Diagnosis not present

## 2018-11-21 DIAGNOSIS — R1312 Dysphagia, oropharyngeal phase: Secondary | ICD-10-CM | POA: Diagnosis not present

## 2018-11-21 DIAGNOSIS — Z9181 History of falling: Secondary | ICD-10-CM | POA: Diagnosis not present

## 2018-11-22 ENCOUNTER — Other Ambulatory Visit: Payer: Self-pay | Admitting: Orthopedic Surgery

## 2018-11-22 DIAGNOSIS — Z471 Aftercare following joint replacement surgery: Secondary | ICD-10-CM

## 2018-11-22 DIAGNOSIS — Z96641 Presence of right artificial hip joint: Secondary | ICD-10-CM

## 2018-11-22 DIAGNOSIS — M5416 Radiculopathy, lumbar region: Secondary | ICD-10-CM | POA: Diagnosis not present

## 2018-11-22 DIAGNOSIS — M6281 Muscle weakness (generalized): Secondary | ICD-10-CM | POA: Diagnosis not present

## 2018-11-22 DIAGNOSIS — R41841 Cognitive communication deficit: Secondary | ICD-10-CM | POA: Diagnosis not present

## 2018-11-22 DIAGNOSIS — R2689 Other abnormalities of gait and mobility: Secondary | ICD-10-CM | POA: Diagnosis not present

## 2018-11-22 DIAGNOSIS — R1312 Dysphagia, oropharyngeal phase: Secondary | ICD-10-CM | POA: Diagnosis not present

## 2018-11-22 DIAGNOSIS — Z9181 History of falling: Secondary | ICD-10-CM | POA: Diagnosis not present

## 2018-11-23 ENCOUNTER — Other Ambulatory Visit: Payer: Self-pay

## 2018-11-23 DIAGNOSIS — M5416 Radiculopathy, lumbar region: Secondary | ICD-10-CM | POA: Diagnosis not present

## 2018-11-23 DIAGNOSIS — R2689 Other abnormalities of gait and mobility: Secondary | ICD-10-CM | POA: Diagnosis not present

## 2018-11-23 DIAGNOSIS — Z9181 History of falling: Secondary | ICD-10-CM | POA: Diagnosis not present

## 2018-11-23 DIAGNOSIS — M6281 Muscle weakness (generalized): Secondary | ICD-10-CM | POA: Diagnosis not present

## 2018-11-23 DIAGNOSIS — R41841 Cognitive communication deficit: Secondary | ICD-10-CM | POA: Diagnosis not present

## 2018-11-23 DIAGNOSIS — R1312 Dysphagia, oropharyngeal phase: Secondary | ICD-10-CM | POA: Diagnosis not present

## 2018-11-26 ENCOUNTER — Encounter
Admission: RE | Admit: 2018-11-26 | Discharge: 2018-11-26 | Disposition: A | Discharge status: Home / Self Care | Payer: Medicare Other | Source: Ambulatory Visit | Attending: Internal Medicine | Admitting: Internal Medicine

## 2018-11-26 DIAGNOSIS — M9701XD Periprosthetic fracture around internal prosthetic right hip joint, subsequent encounter: Secondary | ICD-10-CM | POA: Diagnosis not present

## 2018-11-26 DIAGNOSIS — M545 Low back pain: Secondary | ICD-10-CM | POA: Diagnosis not present

## 2018-11-26 DIAGNOSIS — Z96641 Presence of right artificial hip joint: Secondary | ICD-10-CM | POA: Diagnosis not present

## 2018-11-26 DIAGNOSIS — M25551 Pain in right hip: Secondary | ICD-10-CM | POA: Diagnosis not present

## 2018-11-26 DIAGNOSIS — G8929 Other chronic pain: Secondary | ICD-10-CM | POA: Diagnosis not present

## 2018-11-26 DIAGNOSIS — R2689 Other abnormalities of gait and mobility: Secondary | ICD-10-CM | POA: Diagnosis not present

## 2018-11-26 DIAGNOSIS — Z9181 History of falling: Secondary | ICD-10-CM | POA: Diagnosis not present

## 2018-11-26 DIAGNOSIS — R41841 Cognitive communication deficit: Secondary | ICD-10-CM | POA: Diagnosis not present

## 2018-11-26 DIAGNOSIS — H3530 Unspecified macular degeneration: Secondary | ICD-10-CM | POA: Diagnosis not present

## 2018-11-26 DIAGNOSIS — F329 Major depressive disorder, single episode, unspecified: Secondary | ICD-10-CM | POA: Diagnosis not present

## 2018-11-26 DIAGNOSIS — M5416 Radiculopathy, lumbar region: Secondary | ICD-10-CM | POA: Diagnosis not present

## 2018-11-26 DIAGNOSIS — Z87891 Personal history of nicotine dependence: Secondary | ICD-10-CM | POA: Diagnosis not present

## 2018-11-26 DIAGNOSIS — J449 Chronic obstructive pulmonary disease, unspecified: Secondary | ICD-10-CM | POA: Diagnosis not present

## 2018-11-26 DIAGNOSIS — M1990 Unspecified osteoarthritis, unspecified site: Secondary | ICD-10-CM | POA: Diagnosis not present

## 2018-11-26 DIAGNOSIS — S72344D Nondisplaced spiral fracture of shaft of right femur, subsequent encounter for closed fracture with routine healing: Secondary | ICD-10-CM | POA: Diagnosis not present

## 2018-11-26 DIAGNOSIS — H409 Unspecified glaucoma: Secondary | ICD-10-CM | POA: Diagnosis not present

## 2018-11-26 DIAGNOSIS — K219 Gastro-esophageal reflux disease without esophagitis: Secondary | ICD-10-CM | POA: Diagnosis not present

## 2018-11-26 DIAGNOSIS — M6281 Muscle weakness (generalized): Secondary | ICD-10-CM | POA: Diagnosis not present

## 2018-11-26 DIAGNOSIS — R1312 Dysphagia, oropharyngeal phase: Secondary | ICD-10-CM | POA: Diagnosis not present

## 2018-11-27 DIAGNOSIS — R41841 Cognitive communication deficit: Secondary | ICD-10-CM | POA: Diagnosis not present

## 2018-11-27 DIAGNOSIS — S72344D Nondisplaced spiral fracture of shaft of right femur, subsequent encounter for closed fracture with routine healing: Secondary | ICD-10-CM | POA: Diagnosis not present

## 2018-11-27 DIAGNOSIS — M5416 Radiculopathy, lumbar region: Secondary | ICD-10-CM | POA: Diagnosis not present

## 2018-11-27 DIAGNOSIS — R1312 Dysphagia, oropharyngeal phase: Secondary | ICD-10-CM | POA: Diagnosis not present

## 2018-11-27 DIAGNOSIS — R2689 Other abnormalities of gait and mobility: Secondary | ICD-10-CM | POA: Diagnosis not present

## 2018-11-27 DIAGNOSIS — M6281 Muscle weakness (generalized): Secondary | ICD-10-CM | POA: Diagnosis not present

## 2018-11-28 DIAGNOSIS — R41841 Cognitive communication deficit: Secondary | ICD-10-CM | POA: Diagnosis not present

## 2018-11-28 DIAGNOSIS — M6281 Muscle weakness (generalized): Secondary | ICD-10-CM | POA: Diagnosis not present

## 2018-11-28 DIAGNOSIS — S72344D Nondisplaced spiral fracture of shaft of right femur, subsequent encounter for closed fracture with routine healing: Secondary | ICD-10-CM | POA: Diagnosis not present

## 2018-11-28 DIAGNOSIS — R1312 Dysphagia, oropharyngeal phase: Secondary | ICD-10-CM | POA: Diagnosis not present

## 2018-11-28 DIAGNOSIS — R2689 Other abnormalities of gait and mobility: Secondary | ICD-10-CM | POA: Diagnosis not present

## 2018-11-28 DIAGNOSIS — M5416 Radiculopathy, lumbar region: Secondary | ICD-10-CM | POA: Diagnosis not present

## 2018-11-29 DIAGNOSIS — R41841 Cognitive communication deficit: Secondary | ICD-10-CM | POA: Diagnosis not present

## 2018-11-29 DIAGNOSIS — S72344D Nondisplaced spiral fracture of shaft of right femur, subsequent encounter for closed fracture with routine healing: Secondary | ICD-10-CM | POA: Diagnosis not present

## 2018-11-29 DIAGNOSIS — R1312 Dysphagia, oropharyngeal phase: Secondary | ICD-10-CM | POA: Diagnosis not present

## 2018-11-29 DIAGNOSIS — M5416 Radiculopathy, lumbar region: Secondary | ICD-10-CM | POA: Diagnosis not present

## 2018-11-29 DIAGNOSIS — R2689 Other abnormalities of gait and mobility: Secondary | ICD-10-CM | POA: Diagnosis not present

## 2018-11-29 DIAGNOSIS — M6281 Muscle weakness (generalized): Secondary | ICD-10-CM | POA: Diagnosis not present

## 2018-11-30 ENCOUNTER — Other Ambulatory Visit: Payer: Self-pay

## 2018-11-30 ENCOUNTER — Ambulatory Visit
Admission: RE | Admit: 2018-11-30 | Discharge: 2018-11-30 | Disposition: A | Discharge status: Home / Self Care | Payer: Medicare Other | Source: Ambulatory Visit | Attending: Orthopedic Surgery | Admitting: Orthopedic Surgery

## 2018-11-30 DIAGNOSIS — Z471 Aftercare following joint replacement surgery: Secondary | ICD-10-CM | POA: Insufficient documentation

## 2018-11-30 DIAGNOSIS — Z96641 Presence of right artificial hip joint: Secondary | ICD-10-CM | POA: Insufficient documentation

## 2018-11-30 DIAGNOSIS — M25551 Pain in right hip: Secondary | ICD-10-CM | POA: Diagnosis not present

## 2018-12-03 DIAGNOSIS — M5416 Radiculopathy, lumbar region: Secondary | ICD-10-CM | POA: Diagnosis not present

## 2018-12-03 DIAGNOSIS — R41841 Cognitive communication deficit: Secondary | ICD-10-CM | POA: Diagnosis not present

## 2018-12-03 DIAGNOSIS — R1312 Dysphagia, oropharyngeal phase: Secondary | ICD-10-CM | POA: Diagnosis not present

## 2018-12-03 DIAGNOSIS — R2689 Other abnormalities of gait and mobility: Secondary | ICD-10-CM | POA: Diagnosis not present

## 2018-12-03 DIAGNOSIS — M6281 Muscle weakness (generalized): Secondary | ICD-10-CM | POA: Diagnosis not present

## 2018-12-03 DIAGNOSIS — S72344D Nondisplaced spiral fracture of shaft of right femur, subsequent encounter for closed fracture with routine healing: Secondary | ICD-10-CM | POA: Diagnosis not present

## 2018-12-04 DIAGNOSIS — M6281 Muscle weakness (generalized): Secondary | ICD-10-CM | POA: Diagnosis not present

## 2018-12-04 DIAGNOSIS — M5416 Radiculopathy, lumbar region: Secondary | ICD-10-CM | POA: Diagnosis not present

## 2018-12-04 DIAGNOSIS — S72344D Nondisplaced spiral fracture of shaft of right femur, subsequent encounter for closed fracture with routine healing: Secondary | ICD-10-CM | POA: Diagnosis not present

## 2018-12-04 DIAGNOSIS — R41841 Cognitive communication deficit: Secondary | ICD-10-CM | POA: Diagnosis not present

## 2018-12-04 DIAGNOSIS — R2689 Other abnormalities of gait and mobility: Secondary | ICD-10-CM | POA: Diagnosis not present

## 2018-12-04 DIAGNOSIS — R1312 Dysphagia, oropharyngeal phase: Secondary | ICD-10-CM | POA: Diagnosis not present

## 2018-12-07 ENCOUNTER — Other Ambulatory Visit
Admission: RE | Admit: 2018-12-07 | Discharge: 2018-12-07 | Disposition: A | Discharge status: Home / Self Care | Payer: Medicare Other | Source: Skilled Nursing Facility | Attending: Internal Medicine | Admitting: Internal Medicine

## 2018-12-07 DIAGNOSIS — J029 Acute pharyngitis, unspecified: Secondary | ICD-10-CM | POA: Diagnosis not present

## 2018-12-07 DIAGNOSIS — Z20828 Contact with and (suspected) exposure to other viral communicable diseases: Secondary | ICD-10-CM | POA: Diagnosis not present

## 2018-12-07 LAB — GROUP A STREP BY PCR: Group A Strep by PCR: NOT DETECTED

## 2018-12-09 ENCOUNTER — Other Ambulatory Visit
Admission: RE | Admit: 2018-12-09 | Discharge: 2018-12-09 | Disposition: A | Discharge status: Home / Self Care | Payer: Medicare Other | Source: Ambulatory Visit | Attending: Internal Medicine | Admitting: Internal Medicine

## 2018-12-09 DIAGNOSIS — N390 Urinary tract infection, site not specified: Secondary | ICD-10-CM | POA: Diagnosis not present

## 2018-12-09 LAB — URINALYSIS, COMPLETE (UACMP) WITH MICROSCOPIC
Bilirubin Urine: NEGATIVE
Glucose, UA: NEGATIVE mg/dL
Ketones, ur: NEGATIVE mg/dL
Nitrite: NEGATIVE
Protein, ur: 30 mg/dL — AB
RBC / HPF: >50 RBC/hpf — ABNORMAL HIGH (ref 0–5)
Specific Gravity, Urine: 1.017 (ref 1.005–1.030)
WBC, UA: >50 WBC/hpf — ABNORMAL HIGH (ref 0–5)
pH: 6 (ref 5.0–8.0)

## 2018-12-10 DIAGNOSIS — M5416 Radiculopathy, lumbar region: Secondary | ICD-10-CM | POA: Diagnosis not present

## 2018-12-10 DIAGNOSIS — R1312 Dysphagia, oropharyngeal phase: Secondary | ICD-10-CM | POA: Diagnosis not present

## 2018-12-10 DIAGNOSIS — R41841 Cognitive communication deficit: Secondary | ICD-10-CM | POA: Diagnosis not present

## 2018-12-10 DIAGNOSIS — M6281 Muscle weakness (generalized): Secondary | ICD-10-CM | POA: Diagnosis not present

## 2018-12-10 DIAGNOSIS — R2689 Other abnormalities of gait and mobility: Secondary | ICD-10-CM | POA: Diagnosis not present

## 2018-12-10 DIAGNOSIS — S72344D Nondisplaced spiral fracture of shaft of right femur, subsequent encounter for closed fracture with routine healing: Secondary | ICD-10-CM | POA: Diagnosis not present

## 2018-12-11 LAB — URINE CULTURE: Culture: >=100000 — AB

## 2018-12-12 DIAGNOSIS — S72344D Nondisplaced spiral fracture of shaft of right femur, subsequent encounter for closed fracture with routine healing: Secondary | ICD-10-CM | POA: Diagnosis not present

## 2018-12-12 DIAGNOSIS — R41841 Cognitive communication deficit: Secondary | ICD-10-CM | POA: Diagnosis not present

## 2018-12-12 DIAGNOSIS — R1312 Dysphagia, oropharyngeal phase: Secondary | ICD-10-CM | POA: Diagnosis not present

## 2018-12-12 DIAGNOSIS — R2689 Other abnormalities of gait and mobility: Secondary | ICD-10-CM | POA: Diagnosis not present

## 2018-12-12 DIAGNOSIS — M6281 Muscle weakness (generalized): Secondary | ICD-10-CM | POA: Diagnosis not present

## 2018-12-12 DIAGNOSIS — M5416 Radiculopathy, lumbar region: Secondary | ICD-10-CM | POA: Diagnosis not present

## 2018-12-13 DIAGNOSIS — R2689 Other abnormalities of gait and mobility: Secondary | ICD-10-CM | POA: Diagnosis not present

## 2018-12-13 DIAGNOSIS — R1312 Dysphagia, oropharyngeal phase: Secondary | ICD-10-CM | POA: Diagnosis not present

## 2018-12-13 DIAGNOSIS — M5416 Radiculopathy, lumbar region: Secondary | ICD-10-CM | POA: Diagnosis not present

## 2018-12-13 DIAGNOSIS — S72344D Nondisplaced spiral fracture of shaft of right femur, subsequent encounter for closed fracture with routine healing: Secondary | ICD-10-CM | POA: Diagnosis not present

## 2018-12-13 DIAGNOSIS — R41841 Cognitive communication deficit: Secondary | ICD-10-CM | POA: Diagnosis not present

## 2018-12-13 DIAGNOSIS — M6281 Muscle weakness (generalized): Secondary | ICD-10-CM | POA: Diagnosis not present

## 2018-12-14 DIAGNOSIS — M6281 Muscle weakness (generalized): Secondary | ICD-10-CM | POA: Diagnosis not present

## 2018-12-14 DIAGNOSIS — R2689 Other abnormalities of gait and mobility: Secondary | ICD-10-CM | POA: Diagnosis not present

## 2018-12-14 DIAGNOSIS — R41841 Cognitive communication deficit: Secondary | ICD-10-CM | POA: Diagnosis not present

## 2018-12-14 DIAGNOSIS — R1312 Dysphagia, oropharyngeal phase: Secondary | ICD-10-CM | POA: Diagnosis not present

## 2018-12-14 DIAGNOSIS — M5416 Radiculopathy, lumbar region: Secondary | ICD-10-CM | POA: Diagnosis not present

## 2018-12-14 DIAGNOSIS — S72344D Nondisplaced spiral fracture of shaft of right femur, subsequent encounter for closed fracture with routine healing: Secondary | ICD-10-CM | POA: Diagnosis not present

## 2018-12-17 DIAGNOSIS — R41841 Cognitive communication deficit: Secondary | ICD-10-CM | POA: Diagnosis not present

## 2018-12-17 DIAGNOSIS — Z471 Aftercare following joint replacement surgery: Secondary | ICD-10-CM | POA: Diagnosis not present

## 2018-12-17 DIAGNOSIS — R1312 Dysphagia, oropharyngeal phase: Secondary | ICD-10-CM | POA: Diagnosis not present

## 2018-12-17 DIAGNOSIS — M6281 Muscle weakness (generalized): Secondary | ICD-10-CM | POA: Diagnosis not present

## 2018-12-17 DIAGNOSIS — H04129 Dry eye syndrome of unspecified lacrimal gland: Secondary | ICD-10-CM | POA: Diagnosis not present

## 2018-12-17 DIAGNOSIS — H353212 Exudative age-related macular degeneration, right eye, with inactive choroidal neovascularization: Secondary | ICD-10-CM | POA: Diagnosis not present

## 2018-12-17 DIAGNOSIS — H353134 Nonexudative age-related macular degeneration, bilateral, advanced atrophic with subfoveal involvement: Secondary | ICD-10-CM | POA: Diagnosis not present

## 2018-12-17 DIAGNOSIS — R2689 Other abnormalities of gait and mobility: Secondary | ICD-10-CM | POA: Diagnosis not present

## 2018-12-17 DIAGNOSIS — M5416 Radiculopathy, lumbar region: Secondary | ICD-10-CM | POA: Diagnosis not present

## 2018-12-17 DIAGNOSIS — Z96641 Presence of right artificial hip joint: Secondary | ICD-10-CM | POA: Diagnosis not present

## 2018-12-17 DIAGNOSIS — S72344D Nondisplaced spiral fracture of shaft of right femur, subsequent encounter for closed fracture with routine healing: Secondary | ICD-10-CM | POA: Diagnosis not present

## 2018-12-17 DIAGNOSIS — H35051 Retinal neovascularization, unspecified, right eye: Secondary | ICD-10-CM | POA: Diagnosis not present

## 2018-12-24 DIAGNOSIS — M6281 Muscle weakness (generalized): Secondary | ICD-10-CM | POA: Diagnosis not present

## 2018-12-24 DIAGNOSIS — M545 Low back pain: Secondary | ICD-10-CM | POA: Diagnosis not present

## 2018-12-24 DIAGNOSIS — M25551 Pain in right hip: Secondary | ICD-10-CM | POA: Diagnosis not present

## 2018-12-24 DIAGNOSIS — G8929 Other chronic pain: Secondary | ICD-10-CM | POA: Diagnosis not present

## 2018-12-24 DIAGNOSIS — R2689 Other abnormalities of gait and mobility: Secondary | ICD-10-CM | POA: Diagnosis not present

## 2018-12-24 DIAGNOSIS — Z9181 History of falling: Secondary | ICD-10-CM | POA: Diagnosis not present

## 2018-12-24 DIAGNOSIS — Z96641 Presence of right artificial hip joint: Secondary | ICD-10-CM | POA: Diagnosis not present

## 2018-12-24 DIAGNOSIS — M5416 Radiculopathy, lumbar region: Secondary | ICD-10-CM | POA: Diagnosis not present

## 2018-12-27 DIAGNOSIS — Z20828 Contact with and (suspected) exposure to other viral communicable diseases: Secondary | ICD-10-CM | POA: Diagnosis not present

## 2019-01-02 DIAGNOSIS — M6281 Muscle weakness (generalized): Secondary | ICD-10-CM | POA: Diagnosis not present

## 2019-01-02 DIAGNOSIS — M545 Low back pain: Secondary | ICD-10-CM | POA: Diagnosis not present

## 2019-01-02 DIAGNOSIS — Z9181 History of falling: Secondary | ICD-10-CM | POA: Diagnosis not present

## 2019-01-02 DIAGNOSIS — M25551 Pain in right hip: Secondary | ICD-10-CM | POA: Diagnosis not present

## 2019-01-02 DIAGNOSIS — R2689 Other abnormalities of gait and mobility: Secondary | ICD-10-CM | POA: Diagnosis not present

## 2019-01-02 DIAGNOSIS — G8929 Other chronic pain: Secondary | ICD-10-CM | POA: Diagnosis not present

## 2019-01-02 DIAGNOSIS — Z96641 Presence of right artificial hip joint: Secondary | ICD-10-CM | POA: Diagnosis not present

## 2019-01-02 DIAGNOSIS — M5416 Radiculopathy, lumbar region: Secondary | ICD-10-CM | POA: Diagnosis not present

## 2019-01-07 ENCOUNTER — Other Ambulatory Visit: Payer: Self-pay | Admitting: Internal Medicine

## 2019-01-07 DIAGNOSIS — M5416 Radiculopathy, lumbar region: Secondary | ICD-10-CM | POA: Diagnosis not present

## 2019-01-07 DIAGNOSIS — M25551 Pain in right hip: Secondary | ICD-10-CM | POA: Diagnosis not present

## 2019-01-07 DIAGNOSIS — F5104 Psychophysiologic insomnia: Secondary | ICD-10-CM

## 2019-01-07 DIAGNOSIS — M545 Low back pain: Secondary | ICD-10-CM | POA: Diagnosis not present

## 2019-01-07 DIAGNOSIS — G8929 Other chronic pain: Secondary | ICD-10-CM | POA: Diagnosis not present

## 2019-01-07 DIAGNOSIS — Z9181 History of falling: Secondary | ICD-10-CM | POA: Diagnosis not present

## 2019-01-07 DIAGNOSIS — Z96641 Presence of right artificial hip joint: Secondary | ICD-10-CM | POA: Diagnosis not present

## 2019-01-09 DIAGNOSIS — Z1159 Encounter for screening for other viral diseases: Secondary | ICD-10-CM | POA: Diagnosis not present

## 2019-01-09 DIAGNOSIS — Z20828 Contact with and (suspected) exposure to other viral communicable diseases: Secondary | ICD-10-CM | POA: Diagnosis not present

## 2019-01-11 DIAGNOSIS — M545 Low back pain: Secondary | ICD-10-CM | POA: Diagnosis not present

## 2019-01-11 DIAGNOSIS — M25551 Pain in right hip: Secondary | ICD-10-CM | POA: Diagnosis not present

## 2019-01-11 DIAGNOSIS — G8929 Other chronic pain: Secondary | ICD-10-CM | POA: Diagnosis not present

## 2019-01-11 DIAGNOSIS — Z9181 History of falling: Secondary | ICD-10-CM | POA: Diagnosis not present

## 2019-01-11 DIAGNOSIS — Z96641 Presence of right artificial hip joint: Secondary | ICD-10-CM | POA: Diagnosis not present

## 2019-01-11 DIAGNOSIS — M5416 Radiculopathy, lumbar region: Secondary | ICD-10-CM | POA: Diagnosis not present

## 2019-01-12 ENCOUNTER — Other Ambulatory Visit: Payer: Self-pay | Admitting: Internal Medicine

## 2019-01-12 DIAGNOSIS — F5104 Psychophysiologic insomnia: Secondary | ICD-10-CM

## 2019-01-14 DIAGNOSIS — Z471 Aftercare following joint replacement surgery: Secondary | ICD-10-CM | POA: Diagnosis not present

## 2019-01-14 DIAGNOSIS — Z96641 Presence of right artificial hip joint: Secondary | ICD-10-CM | POA: Diagnosis not present

## 2019-01-15 DIAGNOSIS — Z96641 Presence of right artificial hip joint: Secondary | ICD-10-CM | POA: Diagnosis not present

## 2019-01-15 DIAGNOSIS — M545 Low back pain: Secondary | ICD-10-CM | POA: Diagnosis not present

## 2019-01-15 DIAGNOSIS — G8929 Other chronic pain: Secondary | ICD-10-CM | POA: Diagnosis not present

## 2019-01-15 DIAGNOSIS — M25551 Pain in right hip: Secondary | ICD-10-CM | POA: Diagnosis not present

## 2019-01-15 DIAGNOSIS — Z9181 History of falling: Secondary | ICD-10-CM | POA: Diagnosis not present

## 2019-01-15 DIAGNOSIS — M5416 Radiculopathy, lumbar region: Secondary | ICD-10-CM | POA: Diagnosis not present

## 2019-01-17 DIAGNOSIS — Z96641 Presence of right artificial hip joint: Secondary | ICD-10-CM | POA: Diagnosis not present

## 2019-01-17 DIAGNOSIS — M25551 Pain in right hip: Secondary | ICD-10-CM | POA: Diagnosis not present

## 2019-01-17 DIAGNOSIS — Z9181 History of falling: Secondary | ICD-10-CM | POA: Diagnosis not present

## 2019-01-17 DIAGNOSIS — M545 Low back pain: Secondary | ICD-10-CM | POA: Diagnosis not present

## 2019-01-17 DIAGNOSIS — G8929 Other chronic pain: Secondary | ICD-10-CM | POA: Diagnosis not present

## 2019-01-17 DIAGNOSIS — M5416 Radiculopathy, lumbar region: Secondary | ICD-10-CM | POA: Diagnosis not present

## 2019-01-21 DIAGNOSIS — G8929 Other chronic pain: Secondary | ICD-10-CM | POA: Diagnosis not present

## 2019-01-21 DIAGNOSIS — Z96641 Presence of right artificial hip joint: Secondary | ICD-10-CM | POA: Diagnosis not present

## 2019-01-21 DIAGNOSIS — M25551 Pain in right hip: Secondary | ICD-10-CM | POA: Diagnosis not present

## 2019-01-21 DIAGNOSIS — M5416 Radiculopathy, lumbar region: Secondary | ICD-10-CM | POA: Diagnosis not present

## 2019-01-21 DIAGNOSIS — M545 Low back pain: Secondary | ICD-10-CM | POA: Diagnosis not present

## 2019-01-21 DIAGNOSIS — Z9181 History of falling: Secondary | ICD-10-CM | POA: Diagnosis not present

## 2019-01-22 DIAGNOSIS — M545 Low back pain: Secondary | ICD-10-CM | POA: Diagnosis not present

## 2019-01-22 DIAGNOSIS — M5416 Radiculopathy, lumbar region: Secondary | ICD-10-CM | POA: Diagnosis not present

## 2019-01-22 DIAGNOSIS — Z9181 History of falling: Secondary | ICD-10-CM | POA: Diagnosis not present

## 2019-01-22 DIAGNOSIS — Z96641 Presence of right artificial hip joint: Secondary | ICD-10-CM | POA: Diagnosis not present

## 2019-01-22 DIAGNOSIS — G8929 Other chronic pain: Secondary | ICD-10-CM | POA: Diagnosis not present

## 2019-01-22 DIAGNOSIS — M25551 Pain in right hip: Secondary | ICD-10-CM | POA: Diagnosis not present

## 2019-01-24 DIAGNOSIS — M6281 Muscle weakness (generalized): Secondary | ICD-10-CM | POA: Diagnosis not present

## 2019-01-24 DIAGNOSIS — G8929 Other chronic pain: Secondary | ICD-10-CM | POA: Diagnosis not present

## 2019-01-24 DIAGNOSIS — M25551 Pain in right hip: Secondary | ICD-10-CM | POA: Diagnosis not present

## 2019-01-24 DIAGNOSIS — M545 Low back pain: Secondary | ICD-10-CM | POA: Diagnosis not present

## 2019-01-24 DIAGNOSIS — Z96641 Presence of right artificial hip joint: Secondary | ICD-10-CM | POA: Diagnosis not present

## 2019-01-24 DIAGNOSIS — M5416 Radiculopathy, lumbar region: Secondary | ICD-10-CM | POA: Diagnosis not present

## 2019-01-24 DIAGNOSIS — R2689 Other abnormalities of gait and mobility: Secondary | ICD-10-CM | POA: Diagnosis not present

## 2019-01-24 DIAGNOSIS — Z9181 History of falling: Secondary | ICD-10-CM | POA: Diagnosis not present

## 2019-01-28 DIAGNOSIS — H401133 Primary open-angle glaucoma, bilateral, severe stage: Secondary | ICD-10-CM | POA: Diagnosis not present

## 2019-01-29 DIAGNOSIS — M5416 Radiculopathy, lumbar region: Secondary | ICD-10-CM | POA: Diagnosis not present

## 2019-01-29 DIAGNOSIS — Z96641 Presence of right artificial hip joint: Secondary | ICD-10-CM | POA: Diagnosis not present

## 2019-01-29 DIAGNOSIS — M25551 Pain in right hip: Secondary | ICD-10-CM | POA: Diagnosis not present

## 2019-01-29 DIAGNOSIS — G8929 Other chronic pain: Secondary | ICD-10-CM | POA: Diagnosis not present

## 2019-01-29 DIAGNOSIS — M545 Low back pain: Secondary | ICD-10-CM | POA: Diagnosis not present

## 2019-01-29 DIAGNOSIS — Z9181 History of falling: Secondary | ICD-10-CM | POA: Diagnosis not present

## 2019-01-31 DIAGNOSIS — M25551 Pain in right hip: Secondary | ICD-10-CM | POA: Diagnosis not present

## 2019-01-31 DIAGNOSIS — Z9181 History of falling: Secondary | ICD-10-CM | POA: Diagnosis not present

## 2019-01-31 DIAGNOSIS — M5416 Radiculopathy, lumbar region: Secondary | ICD-10-CM | POA: Diagnosis not present

## 2019-01-31 DIAGNOSIS — G8929 Other chronic pain: Secondary | ICD-10-CM | POA: Diagnosis not present

## 2019-01-31 DIAGNOSIS — Z96641 Presence of right artificial hip joint: Secondary | ICD-10-CM | POA: Diagnosis not present

## 2019-01-31 DIAGNOSIS — M545 Low back pain: Secondary | ICD-10-CM | POA: Diagnosis not present

## 2019-02-05 DIAGNOSIS — G8929 Other chronic pain: Secondary | ICD-10-CM | POA: Diagnosis not present

## 2019-02-05 DIAGNOSIS — M25551 Pain in right hip: Secondary | ICD-10-CM | POA: Diagnosis not present

## 2019-02-05 DIAGNOSIS — M545 Low back pain: Secondary | ICD-10-CM | POA: Diagnosis not present

## 2019-02-05 DIAGNOSIS — M5416 Radiculopathy, lumbar region: Secondary | ICD-10-CM | POA: Diagnosis not present

## 2019-02-05 DIAGNOSIS — Z96641 Presence of right artificial hip joint: Secondary | ICD-10-CM | POA: Diagnosis not present

## 2019-02-05 DIAGNOSIS — Z9181 History of falling: Secondary | ICD-10-CM | POA: Diagnosis not present

## 2019-02-07 DIAGNOSIS — Z9181 History of falling: Secondary | ICD-10-CM | POA: Diagnosis not present

## 2019-02-07 DIAGNOSIS — M5416 Radiculopathy, lumbar region: Secondary | ICD-10-CM | POA: Diagnosis not present

## 2019-02-07 DIAGNOSIS — G8929 Other chronic pain: Secondary | ICD-10-CM | POA: Diagnosis not present

## 2019-02-07 DIAGNOSIS — Z96641 Presence of right artificial hip joint: Secondary | ICD-10-CM | POA: Diagnosis not present

## 2019-02-07 DIAGNOSIS — M545 Low back pain: Secondary | ICD-10-CM | POA: Diagnosis not present

## 2019-02-07 DIAGNOSIS — Z23 Encounter for immunization: Secondary | ICD-10-CM | POA: Diagnosis not present

## 2019-02-07 DIAGNOSIS — M25551 Pain in right hip: Secondary | ICD-10-CM | POA: Diagnosis not present

## 2019-02-12 DIAGNOSIS — G8929 Other chronic pain: Secondary | ICD-10-CM | POA: Diagnosis not present

## 2019-02-12 DIAGNOSIS — M5416 Radiculopathy, lumbar region: Secondary | ICD-10-CM | POA: Diagnosis not present

## 2019-02-12 DIAGNOSIS — Z9181 History of falling: Secondary | ICD-10-CM | POA: Diagnosis not present

## 2019-02-12 DIAGNOSIS — M545 Low back pain: Secondary | ICD-10-CM | POA: Diagnosis not present

## 2019-02-12 DIAGNOSIS — M25551 Pain in right hip: Secondary | ICD-10-CM | POA: Diagnosis not present

## 2019-02-12 DIAGNOSIS — Z96641 Presence of right artificial hip joint: Secondary | ICD-10-CM | POA: Diagnosis not present

## 2019-03-04 ENCOUNTER — Ambulatory Visit (INDEPENDENT_AMBULATORY_CARE_PROVIDER_SITE_OTHER): Payer: Medicare Other | Admitting: Internal Medicine

## 2019-03-04 ENCOUNTER — Ambulatory Visit (INDEPENDENT_AMBULATORY_CARE_PROVIDER_SITE_OTHER)
Admission: RE | Admit: 2019-03-04 | Discharge: 2019-03-04 | Disposition: A | Discharge status: Home / Self Care | Payer: Medicare Other | Source: Ambulatory Visit | Attending: Internal Medicine | Admitting: Internal Medicine

## 2019-03-04 ENCOUNTER — Other Ambulatory Visit (INDEPENDENT_AMBULATORY_CARE_PROVIDER_SITE_OTHER): Payer: Medicare Other

## 2019-03-04 ENCOUNTER — Encounter: Payer: Self-pay | Admitting: Internal Medicine

## 2019-03-04 ENCOUNTER — Other Ambulatory Visit: Payer: Self-pay

## 2019-03-04 VITALS — BP 156/80 | HR 74 | Temp 97.8°F | Ht 62.0 in | Wt 115.5 lb

## 2019-03-04 DIAGNOSIS — N184 Chronic kidney disease, stage 4 (severe): Secondary | ICD-10-CM | POA: Insufficient documentation

## 2019-03-04 DIAGNOSIS — R10817 Generalized abdominal tenderness: Secondary | ICD-10-CM

## 2019-03-04 DIAGNOSIS — K5904 Chronic idiopathic constipation: Secondary | ICD-10-CM

## 2019-03-04 LAB — POC URINALSYSI DIPSTICK (AUTOMATED)
Bilirubin, UA: NEGATIVE
Blood, UA: NEGATIVE
Glucose, UA: NEGATIVE
Ketones, UA: NEGATIVE
Nitrite, UA: NEGATIVE
Protein, UA: POSITIVE — AB
Spec Grav, UA: 1.015 (ref 1.010–1.025)
Urobilinogen, UA: 0.2 E.U./dL
pH, UA: 6 (ref 5.0–8.0)

## 2019-03-04 LAB — CBC WITH DIFFERENTIAL/PLATELET
Basophils Absolute: 0.1 10*3/uL (ref 0.0–0.1)
Basophils Relative: 1.2 % (ref 0.0–3.0)
Eosinophils Absolute: 0.2 10*3/uL (ref 0.0–0.7)
Eosinophils Relative: 1.9 % (ref 0.0–5.0)
HCT: 38.1 % (ref 36.0–46.0)
Hemoglobin: 12.4 g/dL (ref 12.0–15.0)
Lymphocytes Relative: 29.6 % (ref 12.0–46.0)
Lymphs Abs: 2.3 10*3/uL (ref 0.7–4.0)
MCHC: 32.6 g/dL (ref 30.0–36.0)
MCV: 88.8 fl (ref 78.0–100.0)
Monocytes Absolute: 0.5 10*3/uL (ref 0.1–1.0)
Monocytes Relative: 6.3 % (ref 3.0–12.0)
Neutro Abs: 4.8 10*3/uL (ref 1.4–7.7)
Neutrophils Relative %: 61 % (ref 43.0–77.0)
Platelets: 327 10*3/uL (ref 150.0–400.0)
RBC: 4.29 Mil/uL (ref 3.87–5.11)
RDW: 15.2 % (ref 11.5–15.5)
WBC: 7.9 10*3/uL (ref 4.0–10.5)

## 2019-03-04 LAB — AMYLASE: Amylase: 25 U/L — ABNORMAL LOW (ref 27–131)

## 2019-03-04 LAB — BASIC METABOLIC PANEL
BUN: 19 mg/dL (ref 6–23)
CO2: 28 mEq/L (ref 19–32)
Calcium: 10 mg/dL (ref 8.4–10.5)
Chloride: 103 mEq/L (ref 96–112)
Creatinine, Ser: 1.12 mg/dL (ref 0.40–1.20)
GFR: 45.58 mL/min — ABNORMAL LOW (ref >60.00)
Glucose, Bld: 102 mg/dL — ABNORMAL HIGH (ref 70–99)
Potassium: 4.3 mEq/L (ref 3.5–5.1)
Sodium: 138 mEq/L (ref 135–145)

## 2019-03-04 LAB — TSH: TSH: 5.23 u[IU]/mL — ABNORMAL HIGH (ref 0.35–4.50)

## 2019-03-04 LAB — HEPATIC FUNCTION PANEL
ALT: 14 U/L (ref 0–35)
AST: 20 U/L (ref 0–37)
Albumin: 4 g/dL (ref 3.5–5.2)
Alkaline Phosphatase: 68 U/L (ref 39–117)
Bilirubin, Direct: 0.1 mg/dL (ref 0.0–0.3)
Total Bilirubin: 0.4 mg/dL (ref 0.2–1.2)
Total Protein: 7.4 g/dL (ref 6.0–8.3)

## 2019-03-04 LAB — LIPASE: Lipase: 14 U/L (ref 11.0–59.0)

## 2019-03-04 LAB — MAGNESIUM: Magnesium: 2.7 mg/dL — ABNORMAL HIGH (ref 1.5–2.5)

## 2019-03-04 NOTE — Progress Notes (Signed)
Subjective:  Patient ID: [name], female    DOB: 08-Nov-1925  Age: 85 y.o. MRN: [id number]  CC: Abdominal Pain   HPI ALAYA IVERSON presents for f/up - She complains of 1 week history of constipation.  She has not had a normal formed bowel movement for about a week.  She tells me she is using a lot of laxatives and is passing gas and liquids but no formed stool.  She denies nausea, vomiting, abdominal pain, loss of appetite, or weight loss.  Outpatient Medications Prior to Visit  Medication Sig Dispense Refill  . acetaminophen (TYLENOL) 325 MG tablet Take 1-2 tablets (325-650 mg total) by mouth every 6 (six) hours as needed for mild pain (pain score 1-3 or temp > 100.5). 120 tablet 0  . amitriptyline (ELAVIL) 25 MG tablet Take 1 tablet (25 mg total) by mouth at bedtime.    Marland Kitchen aspirin 81 MG chewable tablet Chew 1 tablet (81 mg total) by mouth daily. 60 tablet 1  . beta carotene w/minerals (OCUVITE) tablet Take 1 tablet by mouth 2 (two) times daily.    . bisacodyl (DULCOLAX) 10 MG suppository Place 10 mg rectally daily as needed for moderate constipation.    . bisacodyl (DULCOLAX) 5 MG EC tablet Take 5 mg by mouth daily as needed for moderate constipation.    . docusate sodium (COLACE) 100 MG capsule Take 1 capsule (100 mg total) by mouth 2 (two) times daily. 60 capsule 1  . dorzolamide-timolol (COSOPT) 22.3-6.8 MG/ML ophthalmic solution PLACE 1 DROP INTO BOTH EYES TWICE DAILY    . morphine (MSIR) 15 MG tablet Take 1 tablet (15 mg total) by mouth 4 (four) times daily. 20 tablet 0  . omeprazole (PRILOSEC) 20 MG capsule Take 20 mg by mouth daily before breakfast.     . polyethylene glycol (MIRALAX / GLYCOLAX) 17 g packet Take 17 g by mouth daily as needed for moderate constipation.    . senna (SENOKOT) 8.6 MG TABS tablet Take 1 tablet (8.6 mg total) by mouth 2 (two) times daily. 120 each 0  . Travoprost, BAK Free, (TRAVATAN Z) 0.004 % SOLN ophthalmic solution Place 1 drop into both eyes  at bedtime.     Marland Kitchen HYDROmorphone (DILAUDID) 2 MG tablet Take 1 tablet (2 mg total) by mouth 4 (four) times daily as needed. 20 tablet 0  . LORazepam (ATIVAN) 0.5 MG tablet Take 1 tablet (0.5 mg total) by mouth 2 (two) times daily as needed for anxiety or sleep. 10 tablet 0  . ondansetron (ZOFRAN) 4 MG tablet Take 1 tablet (4 mg total) by mouth every 6 (six) hours as needed for nausea. 20 tablet 0   No facility-administered medications prior to visit.     ROS Review of Systems  Constitutional: Negative for appetite change, diaphoresis, fatigue and unexpected weight change.  HENT: Negative.   Eyes: Negative for visual disturbance.  Respiratory: Negative for cough, chest tightness, shortness of breath and wheezing.   Cardiovascular: Negative for chest pain, palpitations and leg swelling.  Gastrointestinal: Positive for constipation. Negative for abdominal pain, blood in stool, diarrhea, nausea and vomiting.  Endocrine: Negative.   Genitourinary: Negative.  Negative for difficulty urinating, hematuria and urgency.  Musculoskeletal: Negative.   Skin: Negative.   Neurological: Negative.   Hematological: Negative for adenopathy. Does not bruise/bleed easily.  Psychiatric/Behavioral: Negative.     Objective:  BP (!) 156/80 (BP Location: Left Arm, Patient Position: Sitting, Cuff Size: Normal)   Pulse 74  Temp 97.8 F (36.6 C) (Oral)   Ht 5\' 2"  (1.575 m)   Wt 115 lb 8 oz (52.4 kg)   SpO2 96%   BMI 21.13 kg/m   BP Readings from Last 3 Encounters:  03/04/19 (!) 156/80  11/05/18 (!) 134/51  10/05/18 (!) 144/54    Wt Readings from Last 3 Encounters:  03/04/19 115 lb 8 oz (52.4 kg)  11/01/18 119 lb 1.6 oz (54 kg)  10/04/18 125 lb (56.7 kg)    Physical Exam Vitals signs reviewed.  Constitutional:      General: She is not in acute distress.    Appearance: She is well-developed. She is not ill-appearing, toxic-appearing or diaphoretic.  HENT:     Mouth/Throat:     Mouth: Mucous  membranes are moist.  Eyes:     General: No scleral icterus. Cardiovascular:     Heart sounds: Normal heart sounds.  Pulmonary:     Effort: Pulmonary effort is normal.     Breath sounds: No stridor. No wheezing, rhonchi or rales.  Abdominal:     General: Abdomen is flat. Bowel sounds are normal. There is no distension.     Palpations: Abdomen is soft. There is no hepatomegaly, splenomegaly or mass.     Tenderness: There is abdominal tenderness in the right lower quadrant and left lower quadrant. There is no rebound. Negative signs include Murphy's sign.     Hernia: No hernia is present.  Genitourinary:    Adnexa: Right adnexa normal.  Skin:    General: Skin is warm and dry.     Coloration: Skin is not jaundiced.  Neurological:     General: No focal deficit present.     Mental Status: She is alert.     Lab Results  Component Value Date   WBC 7.9 03/04/2019   HGB 12.4 03/04/2019   HCT 38.1 03/04/2019   PLT 327.0 03/04/2019   GLUCOSE 102 (H) 03/04/2019   CHOL 211 (H) 11/09/2017   TRIG 91.0 11/09/2017   HDL 74.40 11/09/2017   LDLDIRECT 106.0 09/03/2007   LDLCALC 118 (H) 11/09/2017   ALT 14 03/04/2019   AST 20 03/04/2019   NA 138 03/04/2019   K 4.3 03/04/2019   CL 103 03/04/2019   CREATININE 1.12 03/04/2019   BUN 19 03/04/2019   CO2 28 03/04/2019   TSH 5.23 (H) 03/04/2019   INR 0.92 06/28/2012   Dg Abd Acute W/chest  Result Date: 03/04/2019 CLINICAL DATA:  Lower quadrant abdominal pain EXAM: DG ABDOMEN ACUTE W/ 1V CHEST COMPARISON:  11/01/2018 chest x-ray FINDINGS: Single-view chest demonstrates bilateral shoulder replacements. No focal opacity or pleural effusion. Normal heart size. No pneumothorax. Supine and upright views of the abdomen demonstrate no free air beneath the diaphragm. Nonobstructed bowel-gas pattern. Bilateral hip replacements. Scoliosis of the spine with lateral plate and fixating screws at L3-L4. Clips in the right upper quadrant IMPRESSION: Negative  abdominal radiographs.  No acute cardiopulmonary disease. Electronically Signed   By: Donavan Foil M.D.   On: 03/04/2019 15:24     Assessment & Plan:   Tawyna was seen today for abdominal pain.  Diagnoses and all orders for this visit:  Generalized abdominal tenderness without rebound tenderness- She has minimal abdominal symptoms and mild tenderness to palpation but the examination is not a consistent with an acute abdominal process.  Her labs and x-rays are reassuring.  I do not know what is driving her symptoms.  Will observe for now. -  CBC with Differential; Future -     Magnesium; Future -     Lipase; Future -     Amylase; Future -     Basic metabolic panel; Future -     TSH; Future -     Hepatic function panel; Future -     Cancel: Urinalysis, Routine w reflex microscopic; Future -     DG Abd Acute W/Chest; Future -     POCT Urinalysis Dipstick (Automated)  Chronic idiopathic constipation- Exam, labs, and x-rays are negative for any complications or secondary causes.  She feels like she is getting adequate symptom relief with her current laxatives and does not want to try a prescription product for constipation. -     CBC with Differential; Future -     Magnesium; Future -     Lipase; Future -     Amylase; Future -     Basic metabolic panel; Future -     TSH; Future -     Hepatic function panel; Future  Chronic renal disease, stage 4, severely decreased glomerular filtration rate (GFR) between 15-29 mL/min/1.73 square meter (HCC)   I have discontinued Tylie M. Rincon's ondansetron, HYDROmorphone, and LORazepam. I am also having her maintain her omeprazole, beta carotene w/minerals, Travoprost (BAK Free), bisacodyl, bisacodyl, polyethylene glycol, acetaminophen, docusate sodium, senna, morphine, amitriptyline, aspirin, and dorzolamide-timolol.  No orders of the defined types were placed in this encounter.    Follow-up: Return if symptoms worsen or fail to  improve.  Scarlette Calico, MD

## 2019-03-04 NOTE — Patient Instructions (Signed)
Abdominal Pain, Adult Abdominal pain can be caused by many things. Often, abdominal pain is not serious and it gets better with no treatment or by being treated at home. However, sometimes abdominal pain is serious. Your health care provider will do a medical history and a physical exam to try to determine the cause of your abdominal pain. Follow these instructions at home:  Take over-the-counter and prescription medicines only as told by your health care provider. Do not take a laxative unless told by your health care provider.  Drink enough fluid to keep your urine clear or pale yellow.  Watch your condition for any changes.  Keep all follow-up visits as told by your health care provider. This is important. Contact a health care provider if:  Your abdominal pain changes or gets worse.  You are not hungry or you lose weight without trying.  You are constipated or have diarrhea for more than 2-3 days.  You have pain when you urinate or have a bowel movement.  Your abdominal pain wakes you up at night.  Your pain gets worse with meals, after eating, or with certain foods.  You are throwing up and cannot keep anything down.  You have a fever. Get help right away if:  Your pain does not go away as soon as your health care provider told you to expect.  You cannot stop throwing up.  Your pain is only in areas of the abdomen, such as the right side or the left lower portion of the abdomen.  You have bloody or black stools, or stools that look like tar.  You have severe pain, cramping, or bloating in your abdomen.  You have signs of dehydration, such as: ? Dark urine, very little urine, or no urine. ? Cracked lips. ? Dry mouth. ? Sunken eyes. ? Sleepiness. ? Weakness. This information is not intended to replace advice given to you by your health care provider. Make sure you discuss any questions you have with your health care provider. Document Released: 01/19/2005 Document  Revised: 10/30/2015 Document Reviewed: 09/23/2015 Elsevier Interactive Patient Education  2020 Elsevier Inc.  

## 2019-10-03 ENCOUNTER — Other Ambulatory Visit: Payer: Self-pay

## 2019-10-03 ENCOUNTER — Ambulatory Visit (INDEPENDENT_AMBULATORY_CARE_PROVIDER_SITE_OTHER): Payer: Medicare Other | Admitting: Nurse Practitioner

## 2019-10-03 ENCOUNTER — Encounter: Payer: Self-pay | Admitting: Nurse Practitioner

## 2019-10-03 VITALS — BP 130/60 | HR 73 | Temp 97.7°F | Ht 62.0 in | Wt 122.0 lb

## 2019-10-03 DIAGNOSIS — R300 Dysuria: Secondary | ICD-10-CM | POA: Diagnosis not present

## 2019-10-03 LAB — POCT URINALYSIS DIPSTICK
Bilirubin, UA: NEGATIVE
Blood, UA: POSITIVE
Glucose, UA: NEGATIVE
Ketones, UA: NEGATIVE
Nitrite, UA: NEGATIVE
Protein, UA: NEGATIVE
Spec Grav, UA: 1.025 (ref 1.010–1.025)
Urobilinogen, UA: NEGATIVE E.U./dL — AB
pH, UA: 5 (ref 5.0–8.0)

## 2019-10-03 MED ORDER — CEPHALEXIN 500 MG PO CAPS: 500.0000 mg | ORAL_CAPSULE | Freq: Two times a day (BID) | ORAL | 0 refills | Status: DC

## 2019-10-03 NOTE — Progress Notes (Addendum)
Established Patient Office Visit  Subjective:  Patient ID: [name], female    DOB: [birth year]  Age: 86 y.o. MRN: [id number]  CC:  Chief Complaint  Patient presents with  . Acute Visit    UTI    HPI [name] a 86 yo  presents for an acute visit for urinary urgency and burning.  She reports she is prone to urinary tract infections.  Her current symptoms started Sunday, and she tried to flush it out with drinking large amounts of fluid.  She continues to have symptoms.  Patient has noted no fevers or chills.  She has had no nausea or vomiting.  She does feel tired but that is chronic.  She has chronic lower back pain but no new flank pain.  She has been eating and drinking at her baseline.  She can take penicillin.  She had dental work done on the bridge 2 weeks ago and took a dental preventative dose of amoxicillin.  She has noted no diarrhea.    She would like to transfer her care to [facility] to be closer to her home at [place].  She has not made a decision yet about which provider she would like to see.  Past Medical History:  Diagnosis Date  . Adenomatous colon polyp 04/26/95   tubulovillous  . Allergy   . Arthritis   . Bradycardia   . Bursitis   . Cataracts, bilateral   . Constipation   . COPD, moderate ([place])    "THIS WAS TOLD TO ME BACK IN THE DAYS WHEN EVERYONE THAT WAS  A SMOKER WAS TOLD THEY WERE A COPD. IM 90 AND I DONT GET SHORT OF BREATH , I DONT HAVE IT "  . Cystitis   . Degenerative joint disease   . Depression    DENIES   . Gastritis   . GERD (gastroesophageal reflux disease)   . Glaucoma   . Hypoglycemia   . Impaired memory   . Insomnia   . Lack of bladder control   . Macular degeneration   . OA (osteoarthritis)   . Osteopenia   . Renal cyst    right  . Shortness of breath   . Small bowel obstruction ([place])   . Trigeminal neuralgia    DENIES   . Urinary incontinence   . Wears dentures     Past Surgical History:    Procedure Laterality Date  . ABDOMINAL HYSTERECTOMY  1992   nonmalignant reasons  . BACK SURGERY  05/20/08   LUMBAR SPINAL FUSION   . BREAST SURGERY     EXCISION OF CALCIUM DEPOSIT   . CATARACT EXTRACTION Bilateral   . CHOLECYSTECTOMY  1994  . DILATION AND CURETTAGE OF UTERUS    . EYE SURGERY     RIGHT EYE HEMORRHAGE SURGERY   . JOINT REPLACEMENT  2005   LEFT HIP   . PARTIAL COLECTOMY  04/26/95   tubulovillous adenoma  . SHOULDER SURGERY  01/22/2009   left  . TONSILLECTOMY    . TOTAL HIP ARTHROPLASTY Right 10/04/2018   Procedure: TOTAL HIP ARTHROPLASTY ANTERIOR APPROACH;  Surgeon: Rod Can, MD;  Location: [facility];  Service: Orthopedics;  Laterality: Right;  . TOTAL SHOULDER ARTHROPLASTY Right 07/05/2012   Procedure: RIGHT TOTAL SHOULDER ARTHROPLASTY;  Surgeon: Marin Shutter, MD;  Location: [facility];  Service: Orthopedics;  Laterality: Right;  Right total shoulder arthroplasty    Family History  Problem Relation Age of Onset  . Cancer  Father        colon  . Heart disease Sister   . Colon polyps Brother   . Diabetes Brother     Social History   Socioeconomic History  . Marital status: Widowed    Spouse name: Not on file  . Number of children: Not on file  . Years of education: Not on file  . Highest education level: Not on file  Occupational History  . Not on file  Tobacco Use  . Smoking status: Former Smoker    Packs/day: 1.00    Years: 50.00    Pack years: 50.00    Types: Cigarettes    Quit date: 06/28/2005    Years since quitting: 14.2  . Smokeless tobacco: Never Used  Substance and Sexual Activity  . Alcohol use: No  . Drug use: No  . Sexual activity: Never  Other Topics Concern  . Not on file  Social History Narrative   Childbirth x 2   Retired Therapist, sports         Social Determinants of Radio broadcast assistant Strain:   . Difficulty of Paying Living Expenses:   Food Insecurity:   . Worried About Charity fundraiser in the Last Year:   . Academic librarian in the Last Year:   Transportation Needs:   . Film/video editor (Medical):   Marland Kitchen Lack of Transportation (Non-Medical):   Physical Activity:   . Days of Exercise per Week:   . Minutes of Exercise per Session:   Stress:   . Feeling of Stress :   Social Connections:   . Frequency of Communication with Friends and Family:   . Frequency of Social Gatherings with Friends and Family:   . Attends Religious Services:   . Active Member of Clubs or Organizations:   . Attends Archivist Meetings:   Marland Kitchen Marital Status:   Intimate Partner Violence:   . Fear of Current or Ex-Partner:   . Emotionally Abused:   Marland Kitchen Physically Abused:   . Sexually Abused:     Outpatient Medications Prior to Visit  Medication Sig Dispense Refill  . acetaminophen (TYLENOL) 325 MG tablet Take 1-2 tablets (325-650 mg total) by mouth every 6 (six) hours as needed for mild pain (pain score 1-3 or temp > 100.5). 120 tablet 0  . aspirin 81 MG chewable tablet Chew 1 tablet (81 mg total) by mouth daily. 60 tablet 1  . beta carotene w/minerals (OCUVITE) tablet Take 1 tablet by mouth 2 (two) times daily.    . bisacodyl (DULCOLAX) 10 MG suppository Place 10 mg rectally daily as needed for moderate constipation.    . bisacodyl (DULCOLAX) 5 MG EC tablet Take 5 mg by mouth daily as needed for moderate constipation.    . docusate sodium (COLACE) 100 MG capsule Take 1 capsule (100 mg total) by mouth 2 (two) times daily. 60 capsule 1  . dorzolamide-timolol (COSOPT) 22.3-6.8 MG/ML ophthalmic solution PLACE 1 DROP INTO BOTH EYES TWICE DAILY    . morphine (MSIR) 15 MG tablet Take 1 tablet (15 mg total) by mouth 4 (four) times daily. 20 tablet 0  . omeprazole (PRILOSEC) 20 MG capsule Take 20 mg by mouth daily before breakfast.     . polyethylene glycol (MIRALAX / GLYCOLAX) 17 g packet Take 17 g by mouth daily as needed for moderate constipation.    . senna (SENOKOT) 8.6 MG TABS tablet Take 1 tablet (8.6 mg total) by mouth 2  (  two) times daily. 120 each 0  . timolol (TIMOPTIC) 0.5 % ophthalmic solution timolol maleate 0.5 % eye drops    . Travoprost, BAK Free, (TRAVATAN Z) 0.004 % SOLN ophthalmic solution Place 1 drop into both eyes at bedtime.     Marland Kitchen amitriptyline (ELAVIL) 25 MG tablet Take 1 tablet (25 mg total) by mouth at bedtime.     No facility-administered medications prior to visit.    Allergies  Allergen Reactions  . Codeine Itching and Nausea Only    Small amounts okay  . Pregabalin Other (See Comments)    Confusion and hallucinations  . Promethazine Hcl Other (See Comments)    Muscle cramps  . Sulfonamide Derivatives Nausea Only   On review of systems performed and pertinent positives none history of present illness otherwise negative.     Objective:    Physical Exam Vitals reviewed.  Constitutional:      Appearance: She is normal weight.  HENT:     Head: Normocephalic.  Cardiovascular:     Rate and Rhythm: Normal rate and regular rhythm.  Pulmonary:     Effort: Pulmonary effort is normal.     Breath sounds: Normal breath sounds.  Abdominal:     General: There is no distension.     Palpations: Abdomen is soft.     Tenderness: There is no abdominal tenderness. There is no right CVA tenderness, left CVA tenderness or guarding.  Musculoskeletal:     Comments: She is walking pushing  a seated walker. Presents alone.   Skin:    General: Skin is warm and dry.  Neurological:     General: No focal deficit present.     Mental Status: She is alert and oriented to person, place, and time.     Comments: Very good historian.   Psychiatric:        Mood and Affect: Mood normal.        Behavior: Behavior normal.     BP 130/60 (BP Location: Left Arm, Patient Position: Sitting, Cuff Size: Normal)   Pulse 73   Temp 97.7 F (36.5 C) (Skin)   Ht 5\' 2"  (1.575 m)   Wt 122 lb (55.3 kg)   SpO2 96%   BMI 22.31 kg/m  Wt Readings from Last 3 Encounters:  10/03/19 122 lb (55.3 kg)  03/04/19  115 lb 8 oz (52.4 kg)  11/01/18 119 lb 1.6 oz (54 kg)     Health Maintenance Due  Topic Date Due  . COVID-19 Vaccine (1) Never done    There are no preventive care reminders to display for this patient.  Lab Results  Component Value Date   TSH 5.23 (H) 03/04/2019   Lab Results  Component Value Date   WBC 7.9 03/04/2019   HGB 12.4 03/04/2019   HCT 38.1 03/04/2019   MCV 88.8 03/04/2019   PLT 327.0 03/04/2019   Lab Results  Component Value Date   NA 138 03/04/2019   K 4.3 03/04/2019   CO2 28 03/04/2019   GLUCOSE 102 (H) 03/04/2019   BUN 19 03/04/2019   CREATININE 1.12 03/04/2019   BILITOT 0.4 03/04/2019   ALKPHOS 68 03/04/2019   AST 20 03/04/2019   ALT 14 03/04/2019   PROT 7.4 03/04/2019   ALBUMIN 4.0 03/04/2019   CALCIUM 10.0 03/04/2019   ANIONGAP 7 11/03/2018   GFR 45.58 (L) 03/04/2019   Lab Results  Component Value Date   CHOL 211 (H) 11/09/2017   Lab Results  Component Value Date  HDL 74.40 11/09/2017   Lab Results  Component Value Date   LDLCALC 118 (H) 11/09/2017   Lab Results  Component Value Date   TRIG 91.0 11/09/2017   Lab Results  Component Value Date   CHOLHDL 3 11/09/2017   No results found for: HGBA1C    Assessment & Plan:   Problem List Items Addressed This Visit      Other   Dysuria - Primary   Relevant Orders   POCT Urinalysis Dipstick (Completed)   Urine Culture   Urine Microscopic Only     Urine point-of-care is positive for 1+ leukocytes, negative nitrites, positive for blood, clear yellow.  Patient with dysuria that she acknowledges is her classic urinary tract infection symptoms.  Patient reports she can tolerate penicillin well.  We have sent off a urine culture and in the meantime we will begin her on Keflex 500 mg twice daily for 7 days.  She had a GFR of 45% in November. She is advised to return to the clinic if symptoms do not improve as expected.  We will call her when we get the urine culture back.  She was  welcomed in and advised to make a follow-up appt with one of [place] for preventative care and to establish care with a new provider. The patient would like to see Dr. Nicki Reaper as her friends have recommended her.   Meds ordered this encounter  Medications  . cephALEXin (KEFLEX) 500 MG capsule    Sig: Take 1 capsule (500 mg total) by mouth 2 (two) times daily for 7 days.    Dispense:  14 capsule    Refill:  0    Order Specific Question:   Supervising Provider    Answer:   Einar Pheasant [[id number]]    Follow-up: Return if symptoms worsen or fail to improve.   This visit occurred during the SARS-CoV-2 public health emergency.  Safety protocols were in place, including screening questions prior to the visit, additional usage of staff PPE, and extensive cleaning of exam room while observing appropriate contact time as indicated for disinfecting solutions.   Denice Paradise, NP

## 2019-10-03 NOTE — Patient Instructions (Addendum)
Begin the Keflex as soon as you can and we will call you with  the urine culture result.   Please make a follow-up appt with one of [place] for preventative care and to establish  care with a new provider.       Dysuria Dysuria is pain or discomfort while urinating. The pain or discomfort may be felt in the part of your body that drains urine from the bladder (urethra) or in the surrounding tissue of the genitals. The pain may also be felt in the groin area, lower abdomen, or lower back. You may have to urinate frequently or have the sudden feeling that you have to urinate (urgency). Dysuria can affect both men and women, but it is more common in women. Dysuria can be caused by many different things, including:  Urinary tract infection.  Kidney stones or bladder stones.  Certain sexually transmitted infections (STIs), such as chlamydia.  Dehydration.  Inflammation of the tissues of the vagina.  Use of certain medicines.  Use of certain soaps or scented products that cause irritation. Follow these instructions at home: General instructions  Watch your condition for any changes.  Urinate often. Avoid holding urine for long periods of time.  After a bowel movement or urination, women should cleanse from front to back, using each tissue only once.  Urinate after sexual intercourse.  Keep all follow-up visits as told by your health care provider. This is important.  If you had any tests done to find the cause of dysuria, it is up to you to get your test results. Ask your health care provider, or the department that is doing the test, when your results will be ready. Eating and drinking   Drink enough fluid to keep your urine pale yellow.  Avoid caffeine, tea, and alcohol. They can irritate the bladder and make dysuria worse. In men, alcohol may irritate the prostate. Medicines  Take over-the-counter and prescription medicines only as told by your health care provider.  If you  were prescribed an antibiotic medicine, take it as told by your health care provider. Do not stop taking the antibiotic even if you start to feel better. Contact a health care provider if:  You have a fever.  You develop pain in your back or sides.  You have nausea or vomiting.  You have blood in your urine.  You are not urinating as often as you usually do. Get help right away if:  Your pain is severe and not relieved with medicines.  You cannot eat or drink without vomiting.  You are confused.  You have a rapid heartbeat while at rest.  You have shaking or chills.  You feel extremely weak. Summary  Dysuria is pain or discomfort while urinating. Many different conditions can lead to dysuria.  If you have dysuria, you may have to urinate frequently or have the sudden feeling that you have to urinate (urgency).  Watch your condition for any changes. Keep all follow-up visits as told by your health care provider.  Make sure that you urinate often and drink enough fluid to keep your urine pale yellow. This information is not intended to replace advice given to you by your health care provider. Make sure you discuss any questions you have with your health care provider. Document Revised: 03/24/2017 Document Reviewed: 01/26/2017 Elsevier Patient Education  [place].

## 2019-10-04 ENCOUNTER — Other Ambulatory Visit: Payer: Self-pay | Admitting: Internal Medicine

## 2019-10-04 LAB — URINALYSIS, MICROSCOPIC ONLY

## 2019-10-05 LAB — URINE CULTURE
MICRO NUMBER:: 10576471
SPECIMEN QUALITY:: ADEQUATE

## 2019-10-07 ENCOUNTER — Telehealth: Payer: Self-pay | Admitting: Nurse Practitioner

## 2019-10-07 MED ORDER — CEFDINIR 300 MG PO CAPS: 300.0000 mg | ORAL_CAPSULE | Freq: Two times a day (BID) | ORAL | 0 refills | Status: AC

## 2019-10-07 NOTE — Telephone Encounter (Signed)
She is still having a lot of pain and burning  4 days into Keflex with urine culture showing:  E coli and for infections other than uncomplicated UTI  caused by E. coli, K. pneumoniae or P. mirabilis:  Cefazolin is resistant if MIC > or = 8 mcg/mL.  PLAN: She should not still be having those symptoms now. I think the Keflex is not adequate for her UTI.    Plan:   1. Stop the Keflex.    2. Omnicef 300 mg 1 pill twice daily x7 days.  3. Take a probiotic of choice- Flor A Stor- Eat yogurt  or buttermilk, cottage cheese, these are all good probiotics.  4. Call back if she develops diarrhea. Ask her if having any fever/chills/weakness, and if eating and drinking well. Any back pain?   5. Call back for symptom update in 2 days- should be feeling better. If not, she will need office visit .

## 2019-10-07 NOTE — Telephone Encounter (Signed)
-----   Message from Filbert Berthold, [place] sent at 10/07/2019  1:30 PM EDT ----- Patient aware of result. Patient is starting to feel a little better but still in a lot of pain and burning. Advised patient to call back if she gets no better after finishing medication.

## 2019-10-08 ENCOUNTER — Other Ambulatory Visit: Payer: Self-pay | Admitting: Nurse Practitioner

## 2019-10-08 MED ORDER — SACCHAROMYCES BOULARDII 250 MG PO CAPS: 250.0000 mg | ORAL_CAPSULE | Freq: Two times a day (BID) | ORAL | 0 refills | Status: DC

## 2019-10-08 NOTE — Progress Notes (Signed)
I ordered FLorastor and changed her to [place]. She is aware.

## 2019-10-09 NOTE — Telephone Encounter (Signed)
Patient started new antibiotic and is already starting to feel much better; patient states she is burning a lot less. She is not having any other sx.

## 2019-10-11 NOTE — Telephone Encounter (Signed)
Can you triage this? Does she vaginal yeast symptoms?

## 2019-10-11 NOTE — Telephone Encounter (Signed)
Pt states that she is feeling much better. She is just having perineal area. Pt thinks it is possibly from wearing pads. Please advise

## 2019-10-11 NOTE — Telephone Encounter (Signed)
Patient states she is only having some burning on the perineal area; patient thinks she is just irritated from having to wear a pad. Patient states her other sx are better. Patient is not having any kind of discharge or yeast sx. Patient still taking prescribed medication; has about 2 or 3 days left.

## 2019-10-17 ENCOUNTER — Other Ambulatory Visit: Payer: Self-pay | Admitting: Nurse Practitioner

## 2019-10-17 ENCOUNTER — Telehealth: Payer: Self-pay | Admitting: Nurse Practitioner

## 2019-10-17 DIAGNOSIS — R300 Dysuria: Secondary | ICD-10-CM

## 2019-10-17 NOTE — Telephone Encounter (Signed)
She is having urinary burning and vaginal soreness. She just completed antibiotics for UTI.   PLAN: She needs to come in tomorrow for UA and urine culture. Please give her an appt. She also needs a pelvic exam. Can we fit her in next week?

## 2019-10-17 NOTE — Telephone Encounter (Signed)
I spoke with patient and she was okay to come in but we do not have any open appts and neither does anyone else in the office. What would you like to do?

## 2019-10-17 NOTE — Telephone Encounter (Signed)
Pt is having pain and burning again like she has another UTI. She just finished up antibiotics yesterday for a UTI and wants to know if she needs to be seen again or what can she do?

## 2019-10-17 NOTE — Progress Notes (Unsigned)
Orders in for UA and Culture for tomorrow. I would like to see her in the office for a pelvic exam.

## 2019-10-17 NOTE — Telephone Encounter (Signed)
She can come in for a repeat UA and culture and will need vaginal exam to check for candidiasis.

## 2019-10-17 NOTE — Telephone Encounter (Signed)
Patient scheduled for tomorrow at 2pm for UA. Patient will call back if this appt does not work for her driver to move appt.

## 2019-10-18 ENCOUNTER — Other Ambulatory Visit: Payer: Self-pay

## 2019-10-18 ENCOUNTER — Other Ambulatory Visit (INDEPENDENT_AMBULATORY_CARE_PROVIDER_SITE_OTHER): Payer: Medicare Other

## 2019-10-18 ENCOUNTER — Telehealth: Payer: Self-pay | Admitting: Nurse Practitioner

## 2019-10-18 DIAGNOSIS — R300 Dysuria: Secondary | ICD-10-CM

## 2019-10-18 LAB — URINALYSIS, ROUTINE W REFLEX MICROSCOPIC
Bilirubin Urine: NEGATIVE
Hgb urine dipstick: NEGATIVE
Ketones, ur: NEGATIVE
Nitrite: NEGATIVE
Specific Gravity, Urine: 1.025 (ref 1.000–1.030)
Total Protein, Urine: NEGATIVE
Urine Glucose: NEGATIVE
Urobilinogen, UA: 0.2 (ref 0.0–1.0)
pH: 5.5 (ref 5.0–8.0)

## 2019-10-18 MED ORDER — FLUCONAZOLE 150 MG PO TABS: 150.0000 mg | ORAL_TABLET | Freq: Once | ORAL | 0 refills | Status: AC

## 2019-10-18 NOTE — Progress Notes (Signed)
Patient scheduled for Monday at 1:30pm

## 2019-10-18 NOTE — Telephone Encounter (Addendum)
She has yeast in UA and I suspect her vaginal complaints are candidiasis from recent urinary tract antibiotic.    Plan: Diflucan 150 mg 1 tablet one time a day.  May repeat in 3 days as needed.  Her [facility] will deliver it tonight.   I spoke to [name] and she will take one pill  tonight. She will take the 2nd pil in 3 days if needed. She has an appt with me on Mon for pelvic check.   Urine culture is pending.

## 2019-10-19 LAB — URINE CULTURE
MICRO NUMBER:: 10635617
SPECIMEN QUALITY:: ADEQUATE

## 2019-10-21 ENCOUNTER — Ambulatory Visit: Payer: Medicare Other | Admitting: Nurse Practitioner

## 2019-10-22 ENCOUNTER — Encounter: Payer: Self-pay | Admitting: Nurse Practitioner

## 2019-10-22 ENCOUNTER — Ambulatory Visit (INDEPENDENT_AMBULATORY_CARE_PROVIDER_SITE_OTHER): Payer: Medicare Other | Admitting: Nurse Practitioner

## 2019-10-22 ENCOUNTER — Other Ambulatory Visit: Payer: Self-pay

## 2019-10-22 VITALS — BP 126/78 | HR 57 | Temp 98.3°F | Ht 62.0 in | Wt 121.0 lb

## 2019-10-22 DIAGNOSIS — B373 Candidiasis of vulva and vagina: Secondary | ICD-10-CM | POA: Diagnosis not present

## 2019-10-22 DIAGNOSIS — B3731 Acute candidiasis of vulva and vagina: Secondary | ICD-10-CM

## 2019-10-22 MED ORDER — MICONAZOLE NITRATE 2 % VA CREA: 1.0000 | TOPICAL_CREAM | Freq: Every day | VAGINAL | 0 refills | Status: AC

## 2019-10-22 NOTE — Patient Instructions (Addendum)
You have a vaginal yeast infection.  Please ask your pharmacist to deliver Monistat 2 % cream to place inside the vaginal and smear around the outside at bedtime for 3-5 days-as needed. This should help resolve this. Call the office to give [place] an update next week.    Vaginal Yeast Infection, Adult  Vaginal yeast infection is a condition that causes vaginal discharge as well as soreness, swelling, and redness (inflammation) of the vagina. This is a common condition. Some women get this infection frequently. What are the causes? This condition is caused by a change in the normal balance of the yeast (candida) and bacteria that live in the vagina. This change causes an overgrowth of yeast, which causes the inflammation. What increases the risk? The condition is more likely to develop in women who:  Take antibiotic medicines.  Have diabetes.  Take birth control pills.  Are pregnant.  Douche often.  Have a weak body defense system (immune system).  Have been taking steroid medicines for a long time.  Frequently wear tight clothing. What are the signs or symptoms? Symptoms of this condition include:  White, thick, creamy vaginal discharge.  Swelling, itching, redness, and irritation of the vagina. The lips of the vagina (vulva) may be affected as well.  Pain or a burning feeling while urinating.  Pain during sex. How is this diagnosed? This condition is diagnosed based on:  Your medical history.  A physical exam.  A pelvic exam. Your health care provider will examine a sample of your vaginal discharge under a microscope. Your health care provider may send this sample for testing to confirm the diagnosis. How is this treated? This condition is treated with medicine. Medicines may be over-the-counter or prescription. You may be told to use one or more of the following:  Medicine that is taken by mouth (orally).  Medicine that is applied as a cream (topically).  Medicine  that is inserted directly into the vagina (suppository). Follow these instructions at home:  Lifestyle  Do not have sex until your health care provider approves. Tell your sex partner that you have a yeast infection. That person should go to his or her health care provider and ask if they should also be treated.  Do not wear tight clothes, such as pantyhose or tight pants.  Wear breathable cotton underwear. General instructions  Take or apply over-the-counter and prescription medicines only as told by your health care provider.  Eat more yogurt. This may help to keep your yeast infection from returning.  Do not use tampons until your health care provider approves.  Try taking a sitz bath to help with discomfort. This is a warm water bath that is taken while you are sitting down. The water should only come up to your hips and should cover your buttocks. Do this 3-4 times per day or as told by your health care provider.  Do not douche.  If you have diabetes, keep your blood sugar levels under control.  Keep all follow-up visits as told by your health care provider. This is important. Contact a health care provider if:  You have a fever.  Your symptoms go away and then return.  Your symptoms do not get better with treatment.  Your symptoms get worse.  You have new symptoms.  You develop blisters in or around your vagina.  You have blood coming from your vagina and it is not your menstrual period.  You develop pain in your abdomen. Summary  Vaginal yeast  infection is a condition that causes discharge as well as soreness, swelling, and redness (inflammation) of the vagina.  This condition is treated with medicine. Medicines may be over-the-counter or prescription.  Take or apply over-the-counter and prescription medicines only as told by your health care provider.  Do not douche. Do not have sex or use tampons until your health care provider approves.  Contact a health  care provider if your symptoms do not get better with treatment or your symptoms go away and then return. This information is not intended to replace advice given to you by your health care provider. Make sure you discuss any questions you have with your health care provider. Document Revised: 11/09/2018 Document Reviewed: 08/28/2017 Elsevier Patient Education  [place].

## 2019-10-22 NOTE — Progress Notes (Signed)
Established Patient Office Visit  Subjective:  Patient ID: [name], female    DOB: 12-Apr-1926  Age: 86 y.o. MRN: [id number]  CC:  Chief Complaint  Patient presents with  . Acute Visit    vaginal discomfort    HPI [name] is a 86 yo who  presents for vaginal exam. She was treated for a UTI with antibiotics and called in reporting pelvic burning and pain. A repeat UA showed no bacterial infection, but positive candidiasis. She was given Diflucan 150 mg stat dose last week and took her second dose today.  She calls in and is still burning. She reports that it is getting better, but very bothersome to her. She is unable to get into the tub to soak. She has used the same wipes for years- no change in personal products. No urinary incontinence.   Past Medical History:  Diagnosis Date  . Adenomatous colon polyp 04/26/95   tubulovillous  . Allergy   . Arthritis   . Bradycardia   . Bursitis   . Cataracts, bilateral   . Constipation   . COPD, moderate ([place])    "THIS WAS TOLD TO ME BACK IN THE DAYS WHEN EVERYONE THAT WAS  A SMOKER WAS TOLD THEY WERE A COPD. IM 90 AND I DONT GET SHORT OF BREATH , I DONT HAVE IT "  . Cystitis   . Degenerative joint disease   . Depression    DENIES   . Gastritis   . GERD (gastroesophageal reflux disease)   . Glaucoma   . Hypoglycemia   . Impaired memory   . Insomnia   . Lack of bladder control   . Macular degeneration   . OA (osteoarthritis)   . Osteopenia   . Renal cyst    right  . Shortness of breath   . Small bowel obstruction ([place])   . Trigeminal neuralgia    DENIES   . Urinary incontinence   . Wears dentures     Past Surgical History:  Procedure Laterality Date  . ABDOMINAL HYSTERECTOMY  1992   nonmalignant reasons  . BACK SURGERY  05/20/08   LUMBAR SPINAL FUSION   . BREAST SURGERY     EXCISION OF CALCIUM DEPOSIT   . CATARACT EXTRACTION Bilateral   . CHOLECYSTECTOMY  1994  . DILATION AND CURETTAGE OF UTERUS    .  EYE SURGERY     RIGHT EYE HEMORRHAGE SURGERY   . JOINT REPLACEMENT  2005   LEFT HIP   . PARTIAL COLECTOMY  04/26/95   tubulovillous adenoma  . SHOULDER SURGERY  01/22/2009   left  . TONSILLECTOMY    . TOTAL HIP ARTHROPLASTY Right 10/04/2018   Procedure: TOTAL HIP ARTHROPLASTY ANTERIOR APPROACH;  Surgeon: Rod Can, MD;  Location: [facility];  Service: Orthopedics;  Laterality: Right;  . TOTAL SHOULDER ARTHROPLASTY Right 07/05/2012   Procedure: RIGHT TOTAL SHOULDER ARTHROPLASTY;  Surgeon: Marin Shutter, MD;  Location: [facility];  Service: Orthopedics;  Laterality: Right;  Right total shoulder arthroplasty    Family History  Problem Relation Age of Onset  . Cancer Father        colon  . Heart disease Sister   . Colon polyps Brother   . Diabetes Brother     Social History   Socioeconomic History  . Marital status: Widowed    Spouse name: Not on file  . Number of children: Not on file  . Years of education: Not on file  .  Highest education level: Not on file  Occupational History  . Not on file  Tobacco Use  . Smoking status: Former Smoker    Packs/day: 1.00    Years: 50.00    Pack years: 50.00    Types: Cigarettes    Quit date: 06/28/2005    Years since quitting: 14.3  . Smokeless tobacco: Never Used  Substance and Sexual Activity  . Alcohol use: No  . Drug use: No  . Sexual activity: Never  Other Topics Concern  . Not on file  Social History Narrative   Childbirth x 2   Retired Therapist, sports         Social Determinants of Radio broadcast assistant Strain:   . Difficulty of Paying Living Expenses:   Food Insecurity:   . Worried About Charity fundraiser in the Last Year:   . Arboriculturist in the Last Year:   Transportation Needs:   . Film/video editor (Medical):   Marland Kitchen Lack of Transportation (Non-Medical):   Physical Activity:   . Days of Exercise per Week:   . Minutes of Exercise per Session:   Stress:   . Feeling of Stress :   Social Connections:   .  Frequency of Communication with Friends and Family:   . Frequency of Social Gatherings with Friends and Family:   . Attends Religious Services:   . Active Member of Clubs or Organizations:   . Attends Archivist Meetings:   Marland Kitchen Marital Status:   Intimate Partner Violence:   . Fear of Current or Ex-Partner:   . Emotionally Abused:   Marland Kitchen Physically Abused:   . Sexually Abused:     Outpatient Medications Prior to Visit  Medication Sig Dispense Refill  . acetaminophen (TYLENOL) 325 MG tablet Take 1-2 tablets (325-650 mg total) by mouth every 6 (six) hours as needed for mild pain (pain score 1-3 or temp > 100.5). 120 tablet 0  . aspirin 81 MG chewable tablet Chew 1 tablet (81 mg total) by mouth daily. 60 tablet 1  . beta carotene w/minerals (OCUVITE) tablet Take 1 tablet by mouth 2 (two) times daily.    . bisacodyl (DULCOLAX) 10 MG suppository Place 10 mg rectally daily as needed for moderate constipation.    . bisacodyl (DULCOLAX) 5 MG EC tablet Take 5 mg by mouth daily as needed for moderate constipation.    . docusate sodium (COLACE) 100 MG capsule Take 1 capsule (100 mg total) by mouth 2 (two) times daily. 60 capsule 1  . dorzolamide-timolol (COSOPT) 22.3-6.8 MG/ML ophthalmic solution PLACE 1 DROP INTO BOTH EYES TWICE DAILY    . omeprazole (PRILOSEC) 20 MG capsule Take 20 mg by mouth daily before breakfast.     . polyethylene glycol (MIRALAX / GLYCOLAX) 17 g packet Take 17 g by mouth daily as needed for moderate constipation.    . saccharomyces boulardii (FLORASTOR) 250 MG capsule Take 1 capsule (250 mg total) by mouth 2 (two) times daily. 14 capsule 0  . senna (SENOKOT) 8.6 MG TABS tablet Take 1 tablet (8.6 mg total) by mouth 2 (two) times daily. 120 each 0  . timolol (TIMOPTIC) 0.5 % ophthalmic solution timolol maleate 0.5 % eye drops    . Travoprost, BAK Free, (TRAVATAN Z) 0.004 % SOLN ophthalmic solution Place 1 drop into both eyes at bedtime.     Marland Kitchen morphine (MSIR) 15 MG tablet  Take 1 tablet (15 mg total) by mouth 4 (four) times daily. (  Patient not taking: Reported on 10/22/2019) 20 tablet 0   No facility-administered medications prior to visit.    Allergies  Allergen Reactions  . Codeine Itching and Nausea Only    Small amounts okay  . Pregabalin Other (See Comments)    Confusion and hallucinations  . Promethazine Hcl Other (See Comments)    Muscle cramps  . Sulfonamide Derivatives Nausea Only    ROS Review of Systems  Constitutional: Negative for chills, fever and unexpected weight change.  Gastrointestinal: Negative.  Negative for diarrhea.  Genitourinary: Positive for vaginal discharge and vaginal pain. Negative for difficulty urinating, flank pain and pelvic pain.  Musculoskeletal: Negative.   Neurological: Negative.       Objective:    Physical Exam Vitals reviewed.  Constitutional:      Appearance: Normal appearance.  HENT:     Head: Normocephalic.  Cardiovascular:     Rate and Rhythm: Normal rate and regular rhythm.     Pulses: Normal pulses.     Heart sounds: Normal heart sounds.  Abdominal:     Palpations: Abdomen is soft.     Tenderness: There is no abdominal tenderness.  Genitourinary:    Comments: Vaginal introitus mildly erythemaouts and  large amount of thick creamy discharge caked b/w the labia majora and minora. No blisters, ulcerations, or edema.  Anal area is normal.  Musculoskeletal:     Cervical back: Normal range of motion and neck supple.  Neurological:     Mental Status: She is alert.     BP 126/78 (BP Location: Left Arm, Patient Position: Sitting, Cuff Size: Normal)   Pulse (!) 57   Temp 98.3 F (36.8 C) (Oral)   Ht 5\' 2"  (1.575 m)   Wt 121 lb (54.9 kg)   SpO2 96%   BMI 22.13 kg/m  Wt Readings from Last 3 Encounters:  10/22/19 121 lb (54.9 kg)  10/03/19 122 lb (55.3 kg)  03/04/19 115 lb 8 oz (52.4 kg)     Health Maintenance Due  Topic Date Due  . COVID-19 Vaccine (1) Never done    There are no  preventive care reminders to display for this patient.  Lab Results  Component Value Date   TSH 5.23 (H) 03/04/2019   Lab Results  Component Value Date   WBC 7.9 03/04/2019   HGB 12.4 03/04/2019   HCT 38.1 03/04/2019   MCV 88.8 03/04/2019   PLT 327.0 03/04/2019   Lab Results  Component Value Date   NA 138 03/04/2019   K 4.3 03/04/2019   CO2 28 03/04/2019   GLUCOSE 102 (H) 03/04/2019   BUN 19 03/04/2019   CREATININE 1.12 03/04/2019   BILITOT 0.4 03/04/2019   ALKPHOS 68 03/04/2019   AST 20 03/04/2019   ALT 14 03/04/2019   PROT 7.4 03/04/2019   ALBUMIN 4.0 03/04/2019   CALCIUM 10.0 03/04/2019   ANIONGAP 7 11/03/2018   GFR 45.58 (L) 03/04/2019   Lab Results  Component Value Date   CHOL 211 (H) 11/09/2017   Lab Results  Component Value Date   HDL 74.40 11/09/2017   Lab Results  Component Value Date   LDLCALC 118 (H) 11/09/2017   Lab Results  Component Value Date   TRIG 91.0 11/09/2017   Lab Results  Component Value Date   CHOLHDL 3 11/09/2017   No results found for: HGBA1C    Assessment & Plan:   Problem List Items Addressed This Visit    None    Visit Diagnoses  Vaginal candida    -  Primary      Meds ordered this encounter  Medications  . miconazole (MONISTAT 7 SIMPLY CURE) 2 % vaginal cream    Sig: Place 1 Applicatorful vaginally at bedtime for 5 days.    Dispense:  45 g    Refill:  0    Order Specific Question:   Supervising Provider    Answer:   Einar Pheasant [[id number]]   You have a vaginal yeast infection.  Please ask your pharmacist to deliver Monistat 2 % cream to place inside the vaginal and smear around the outside at bedtime for 3-5 days-as needed. This should help resolve this. Call the office to give [place] an update next week.   Follow-up: No follow-ups on file.   This visit occurred during the SARS-CoV-2 public health emergency.  Safety protocols were in place, including screening questions prior to the visit, additional usage of  staff PPE, and extensive cleaning of exam room while observing appropriate contact time as indicated for disinfecting solutions.   Denice Paradise, NP

## 2019-11-01 ENCOUNTER — Telehealth: Payer: Self-pay | Admitting: Nurse Practitioner

## 2019-11-01 NOTE — Telephone Encounter (Signed)
Patient scheduled for 11/04/19 at 3pm

## 2019-11-01 NOTE — Telephone Encounter (Signed)
Ask  her to come in for UA and urine culture for dysuria. Any more vaginal yeast symptoms? Did the vaginal discharge clear up?

## 2019-11-01 NOTE — Telephone Encounter (Signed)
Can you triage this patient please?

## 2019-11-01 NOTE — Telephone Encounter (Signed)
LMTCB to schedule appt

## 2019-11-01 NOTE — Telephone Encounter (Signed)
Patient was told by Maudie Mercury to call back if she was still having issues. When she goes to the bathroom urine burns, this has been going on for 3 days.

## 2019-11-04 ENCOUNTER — Encounter: Payer: Self-pay | Admitting: Nurse Practitioner

## 2019-11-04 ENCOUNTER — Other Ambulatory Visit: Payer: Self-pay

## 2019-11-04 ENCOUNTER — Ambulatory Visit (INDEPENDENT_AMBULATORY_CARE_PROVIDER_SITE_OTHER): Payer: Medicare Other | Admitting: Nurse Practitioner

## 2019-11-04 ENCOUNTER — Other Ambulatory Visit (HOSPITAL_COMMUNITY)
Admission: RE | Admit: 2019-11-04 | Discharge: 2019-11-04 | Disposition: A | Discharge status: Home / Self Care | Payer: Medicare Other | Source: Ambulatory Visit | Attending: Nurse Practitioner | Admitting: Nurse Practitioner

## 2019-11-04 VITALS — BP 102/62 | HR 64 | Temp 98.4°F | Ht 62.0 in | Wt 119.0 lb

## 2019-11-04 DIAGNOSIS — N760 Acute vaginitis: Secondary | ICD-10-CM | POA: Diagnosis not present

## 2019-11-04 DIAGNOSIS — R300 Dysuria: Secondary | ICD-10-CM

## 2019-11-04 LAB — POCT URINALYSIS DIPSTICK
Bilirubin, UA: NEGATIVE
Blood, UA: POSITIVE
Glucose, UA: POSITIVE — AB
Ketones, UA: NEGATIVE
Nitrite, UA: POSITIVE
Protein, UA: POSITIVE — AB
Spec Grav, UA: >=1.03 — AB (ref 1.010–1.025)
Urobilinogen, UA: 1 E.U./dL
pH, UA: 5.5 (ref 5.0–8.0)

## 2019-11-04 NOTE — Progress Notes (Signed)
Established Patient Office Visit  Subjective:  Patient ID: [name], female    DOB: 04-Jul-1925  Age: 86 y.o. MRN: [id number]  CC:  Chief Complaint  Patient presents with  . Follow-up    yeast infection/labs    HPI [name] is a 86 yo who established care with me last month with an acute UTI. She reported that she was prone to UTI's.  She has a PMH of right renal cyst, urinary incontinence, lack of bladder control.   10/03/2019: Initial encounter:  Urine point-of-care is positive for 1+ leukocytes, negative nitrites, positive for blood, clear yellow.  Patient with dysuria that she acknowledges is her classic urinary tract infection symptoms.  Patient reports she can tolerate penicillin well.  We have sent off a urine culture and in the meantime we will begin her on Keflex 500 mg twice daily for 7 days.  She had a GFR of 45% in November.  10/07/2019: She is still having a lot of pain and burning  4 days into Keflex with urine culture showing:  E coli and for infections other than uncomplicated UTI  caused by E. coli, K. pneumoniae or P. mirabilis:  Cefazolin is resistant if MIC > or = 8 mcg/mL. She was advised to stop the Keflex and begin Omnicef 300 mg 1 pill twice daily for 7 days.  Begin Florastor as well as yogurt, buttermilk, cottage cheese which are good probiotics.   10/17/2019: She is having urinary burning and vaginal soreness. She just completed antibiotics for UTI.   10/18/2019: She has yeast in UA and Culture no growth. I suspect her vaginal complaints are candidiasis from recent urinary tract antibiotic.  Treated with  Diflucan 150 mg 1 tablet one time a day. Repeated in 3 days.   10/22/2019: Called in with pelvic burning and pain.  A pelvic exam-  external showed persistent vaginal candidiasis. Monistat 2% cream was ordered for 3-5 days.   Today, 11/01/2019: When she goes to the bathroom urine burns, this has been going on for 3 days.  She has little pelvic burning.   No fevers or chills.  No suprapubic pain or tenderness.  No back pain.  She does not pass blood.  She is passing urine normally.  She is drinking plenty.  Appetite and diet are normal.  She does wear pads for urinary incontinence.  She does not feel that her vagina is as irritated. She used the Monistat without a problem.  She is asking for another pelvic exam.   Wt Readings from Last 3 Encounters:  11/04/19 119 lb (54 kg)  10/22/19 121 lb (54.9 kg)  10/03/19 122 lb (55.3 kg)    Past Medical History:  Diagnosis Date  . Adenomatous colon polyp 04/26/95   tubulovillous  . Allergy   . Arthritis   . Bradycardia   . Bursitis   . Cataracts, bilateral   . Constipation   . COPD, moderate ([place])    "THIS WAS TOLD TO ME BACK IN THE DAYS WHEN EVERYONE THAT WAS  A SMOKER WAS TOLD THEY WERE A COPD. IM 90 AND I DONT GET SHORT OF BREATH , I DONT HAVE IT "  . Cystitis   . Degenerative joint disease   . Depression    DENIES   . Gastritis   . GERD (gastroesophageal reflux disease)   . Glaucoma   . Hypoglycemia   . Impaired memory   . Insomnia   . Lack of bladder control   .  Macular degeneration   . OA (osteoarthritis)   . Osteopenia   . Renal cyst    right  . Shortness of breath   . Small bowel obstruction ([place])   . Trigeminal neuralgia    DENIES   . Urinary incontinence   . Wears dentures     Past Surgical History:  Procedure Laterality Date  . ABDOMINAL HYSTERECTOMY  1992   nonmalignant reasons  . BACK SURGERY  05/20/08   LUMBAR SPINAL FUSION   . BREAST SURGERY     EXCISION OF CALCIUM DEPOSIT   . CATARACT EXTRACTION Bilateral   . CHOLECYSTECTOMY  1994  . DILATION AND CURETTAGE OF UTERUS    . EYE SURGERY     RIGHT EYE HEMORRHAGE SURGERY   . JOINT REPLACEMENT  2005   LEFT HIP   . PARTIAL COLECTOMY  04/26/95   tubulovillous adenoma  . SHOULDER SURGERY  01/22/2009   left  . TONSILLECTOMY    . TOTAL HIP ARTHROPLASTY Right 10/04/2018   Procedure: TOTAL HIP ARTHROPLASTY ANTERIOR  APPROACH;  Surgeon: Rod Can, MD;  Location: [facility];  Service: Orthopedics;  Laterality: Right;  . TOTAL SHOULDER ARTHROPLASTY Right 07/05/2012   Procedure: RIGHT TOTAL SHOULDER ARTHROPLASTY;  Surgeon: Marin Shutter, MD;  Location: [facility];  Service: Orthopedics;  Laterality: Right;  Right total shoulder arthroplasty    Family History  Problem Relation Age of Onset  . Cancer Father        colon  . Heart disease Sister   . Colon polyps Brother   . Diabetes Brother     Social History   Socioeconomic History  . Marital status: Widowed    Spouse name: Not on file  . Number of children: Not on file  . Years of education: Not on file  . Highest education level: Not on file  Occupational History  . Not on file  Tobacco Use  . Smoking status: Former Smoker    Packs/day: 1.00    Years: 50.00    Pack years: 50.00    Types: Cigarettes    Quit date: 06/28/2005    Years since quitting: 14.3  . Smokeless tobacco: Never Used  Substance and Sexual Activity  . Alcohol use: No  . Drug use: No  . Sexual activity: Never  Other Topics Concern  . Not on file  Social History Narrative   Childbirth x 2   Retired Therapist, sports         Social Determinants of Radio broadcast assistant Strain:   . Difficulty of Paying Living Expenses:   Food Insecurity:   . Worried About Charity fundraiser in the Last Year:   . Arboriculturist in the Last Year:   Transportation Needs:   . Film/video editor (Medical):   Marland Kitchen Lack of Transportation (Non-Medical):   Physical Activity:   . Days of Exercise per Week:   . Minutes of Exercise per Session:   Stress:   . Feeling of Stress :   Social Connections:   . Frequency of Communication with Friends and Family:   . Frequency of Social Gatherings with Friends and Family:   . Attends Religious Services:   . Active Member of Clubs or Organizations:   . Attends Archivist Meetings:   Marland Kitchen Marital Status:   Intimate Partner Violence:   . Fear of  Current or Ex-Partner:   . Emotionally Abused:   Marland Kitchen Physically Abused:   . Sexually Abused:  Outpatient Medications Prior to Visit  Medication Sig Dispense Refill  . acetaminophen (TYLENOL) 325 MG tablet Take 1-2 tablets (325-650 mg total) by mouth every 6 (six) hours as needed for mild pain (pain score 1-3 or temp > 100.5). 120 tablet 0  . aspirin 81 MG chewable tablet Chew 1 tablet (81 mg total) by mouth daily. 60 tablet 1  . beta carotene w/minerals (OCUVITE) tablet Take 1 tablet by mouth 2 (two) times daily.    . bisacodyl (DULCOLAX) 10 MG suppository Place 10 mg rectally daily as needed for moderate constipation.    . bisacodyl (DULCOLAX) 5 MG EC tablet Take 5 mg by mouth daily as needed for moderate constipation.    . docusate sodium (COLACE) 100 MG capsule Take 1 capsule (100 mg total) by mouth 2 (two) times daily. 60 capsule 1  . dorzolamide-timolol (COSOPT) 22.3-6.8 MG/ML ophthalmic solution PLACE 1 DROP INTO BOTH EYES TWICE DAILY    . omeprazole (PRILOSEC) 20 MG capsule Take 20 mg by mouth daily before breakfast.     . polyethylene glycol (MIRALAX / GLYCOLAX) 17 g packet Take 17 g by mouth daily as needed for moderate constipation.    . saccharomyces boulardii (FLORASTOR) 250 MG capsule Take 1 capsule (250 mg total) by mouth 2 (two) times daily. 14 capsule 0  . senna (SENOKOT) 8.6 MG TABS tablet Take 1 tablet (8.6 mg total) by mouth 2 (two) times daily. 120 each 0  . timolol (TIMOPTIC) 0.5 % ophthalmic solution timolol maleate 0.5 % eye drops    . Travoprost, BAK Free, (TRAVATAN Z) 0.004 % SOLN ophthalmic solution Place 1 drop into both eyes at bedtime.      No facility-administered medications prior to visit.    Allergies  Allergen Reactions  . Codeine Itching and Nausea Only    Small amounts okay  . Pregabalin Other (See Comments)    Confusion and hallucinations  . Promethazine Hcl Other (See Comments)    Muscle cramps  . Sulfonamide Derivatives Nausea Only    Review  of Systems Pertinent positives as noted in history of present illness otherwise negative.   Objective:    Physical Exam Vitals reviewed. Exam conducted with a chaperone present.  Constitutional:      Appearance: Normal appearance.  HENT:     Head: Normocephalic.  Cardiovascular:     Rate and Rhythm: Normal rate and regular rhythm.     Pulses: Normal pulses.     Heart sounds: Normal heart sounds.  Pulmonary:     Effort: Pulmonary effort is normal.     Breath sounds: Normal breath sounds.  Genitourinary:    General: Normal vulva.     Labia:        Right: No rash or lesion.        Left: No rash.      Urethra: Prolapse present.     Vagina: Prolapsed vaginal walls present. No vaginal discharge, erythema, tenderness, bleeding or lesions.     Comments: Cervix not identified. Bimanual not performed. Rectocele present. Q Tip swab performed of vaginal canal and sent to cytology for candidiasis, BV, trich.  Musculoskeletal:        General: No swelling. Normal range of motion.  Skin:    General: Skin is warm and dry.  Neurological:     General: No focal deficit present.     Mental Status: She is alert and oriented to person, place, and time.  Psychiatric:        Mood  and Affect: Mood normal.        Behavior: Behavior normal.    BP 102/62 (BP Location: Left Arm, Patient Position: Sitting, Cuff Size: Normal)   Pulse 64   Temp 98.4 F (36.9 C) (Oral)   Ht 5\' 2"  (1.575 m)   Wt 119 lb (54 kg)   SpO2 96%   BMI 21.77 kg/m  Wt Readings from Last 3 Encounters:  11/04/19 119 lb (54 kg)  10/22/19 121 lb (54.9 kg)  10/03/19 122 lb (55.3 kg)     Health Maintenance Due  Topic Date Due  . COVID-19 Vaccine (1) Never done    There are no preventive care reminders to display for this patient.  Lab Results  Component Value Date   TSH 5.23 (H) 03/04/2019   Lab Results  Component Value Date   WBC 7.9 03/04/2019   HGB 12.4 03/04/2019   HCT 38.1 03/04/2019   MCV 88.8 03/04/2019    PLT 327.0 03/04/2019   Lab Results  Component Value Date   NA 138 03/04/2019   K 4.3 03/04/2019   CO2 28 03/04/2019   GLUCOSE 102 (H) 03/04/2019   BUN 19 03/04/2019   CREATININE 1.12 03/04/2019   BILITOT 0.4 03/04/2019   ALKPHOS 68 03/04/2019   AST 20 03/04/2019   ALT 14 03/04/2019   PROT 7.4 03/04/2019   ALBUMIN 4.0 03/04/2019   CALCIUM 10.0 03/04/2019   ANIONGAP 7 11/03/2018   GFR 45.58 (L) 03/04/2019   Lab Results  Component Value Date   CHOL 211 (H) 11/09/2017   Lab Results  Component Value Date   HDL 74.40 11/09/2017   Lab Results  Component Value Date   LDLCALC 118 (H) 11/09/2017   Lab Results  Component Value Date   TRIG 91.0 11/09/2017   Lab Results  Component Value Date   CHOLHDL 3 11/09/2017   No results found for: HGBA1C    Assessment & Plan:   Problem List Items Addressed This Visit      Other   Dysuria - Primary   Relevant Orders   POCT Urinalysis Dipstick (Completed)   Urine Culture   Urine Microscopic Only (Completed)   Ambulatory referral to Urology    Other Visit Diagnoses    Acute vaginitis       Relevant Orders   Cervicovaginal ancillary only( [place])     Vaginal exam showed no more redness or white discharge. She does have pelvic relaxation changes noted.   Referral to Urology for persistent burning with urination.   Please stop drinking the tea, coke, pepsi products.   Urine culture is pending to make sure the bacterial infection is gone.  No orders of the defined types were placed in this encounter.   Follow-up: No follow-ups on file.   This visit occurred during the SARS-CoV-2 public health emergency.  Safety protocols were in place, including screening questions prior to the visit, additional usage of staff PPE, and extensive cleaning of exam room while observing appropriate contact time as indicated for disinfecting solutions.    Denice Paradise, NP

## 2019-11-04 NOTE — Patient Instructions (Addendum)
Vaginal exam showed no more redness or white discharge. You do have relaxation changes noted.   I will send you to Urology for persistent burning with urination.   Please stop drinking the tea, coke, pepsi products.   Urine culture is pending to make sure the bacterial infection is gone.   Dysuria Dysuria is pain or discomfort while urinating. The pain or discomfort may be felt in the part of your body that drains urine from the bladder (urethra) or in the surrounding tissue of the genitals. The pain may also be felt in the groin area, lower abdomen, or lower back. You may have to urinate frequently or have the sudden feeling that you have to urinate (urgency). Dysuria can affect both men and women, but it is more common in women. Dysuria can be caused by many different things, including:  Urinary tract infection.  Kidney stones or bladder stones.  Certain sexually transmitted infections (STIs), such as chlamydia.  Dehydration.  Inflammation of the tissues of the vagina.  Use of certain medicines.  Use of certain soaps or scented products that cause irritation. Follow these instructions at home: General instructions  Watch your condition for any changes.  Urinate often. Avoid holding urine for long periods of time.  After a bowel movement or urination, women should cleanse from front to back, using each tissue only once.  Urinate after sexual intercourse.  Keep all follow-up visits as told by your health care provider. This is important.  If you had any tests done to find the cause of dysuria, it is up to you to get your test results. Ask your health care provider, or the department that is doing the test, when your results will be ready. Eating and drinking   Drink enough fluid to keep your urine pale yellow.  Avoid caffeine, tea, and alcohol. They can irritate the bladder and make dysuria worse. In men, alcohol may irritate the prostate. Medicines  Take  over-the-counter and prescription medicines only as told by your health care provider.  If you were prescribed an antibiotic medicine, take it as told by your health care provider. Do not stop taking the antibiotic even if you start to feel better. Contact a health care provider if:  You have a fever.  You develop pain in your back or sides.  You have nausea or vomiting.  You have blood in your urine.  You are not urinating as often as you usually do. Get help right away if:  Your pain is severe and not relieved with medicines.  You cannot eat or drink without vomiting.  You are confused.  You have a rapid heartbeat while at rest.  You have shaking or chills.  You feel extremely weak. Summary  Dysuria is pain or discomfort while urinating. Many different conditions can lead to dysuria.  If you have dysuria, you may have to urinate frequently or have the sudden feeling that you have to urinate (urgency).  Watch your condition for any changes. Keep all follow-up visits as told by your health care provider.  Make sure that you urinate often and drink enough fluid to keep your urine pale yellow. This information is not intended to replace advice given to you by your health care provider. Make sure you discuss any questions you have with your health care provider. Document Revised: 03/24/2017 Document Reviewed: 01/26/2017 Elsevier Patient Education  [employer].

## 2019-11-05 LAB — URINALYSIS, MICROSCOPIC ONLY: RBC / HPF: NONE SEEN (ref 0–?)

## 2019-11-06 ENCOUNTER — Encounter: Payer: Self-pay | Admitting: Nurse Practitioner

## 2019-11-06 LAB — CERVICOVAGINAL ANCILLARY ONLY
Bacterial Vaginitis (gardnerella): NEGATIVE
Candida Glabrata: NEGATIVE
Candida Vaginitis: NEGATIVE
Comment: NEGATIVE
Comment: NEGATIVE
Comment: NEGATIVE
Comment: NEGATIVE
Trichomonas: NEGATIVE

## 2019-11-06 LAB — URINE CULTURE
MICRO NUMBER:: 10693189
SPECIMEN QUALITY:: ADEQUATE

## 2019-11-06 MED ORDER — FLUCONAZOLE 150 MG PO TABS: ORAL_TABLET | ORAL | 0 refills | Status: DC

## 2019-11-06 MED ORDER — CIPROFLOXACIN HCL 250 MG PO TABS: 250.0000 mg | ORAL_TABLET | Freq: Two times a day (BID) | ORAL | 0 refills | Status: AC

## 2019-11-06 MED ORDER — SACCHAROMYCES BOULARDII 250 MG PO CAPS: 250.0000 mg | ORAL_CAPSULE | Freq: Two times a day (BID) | ORAL | 0 refills | Status: AC

## 2019-11-08 ENCOUNTER — Telehealth: Payer: Self-pay | Admitting: Nurse Practitioner

## 2019-11-08 NOTE — Telephone Encounter (Signed)
I left message on machine for patient to call us to give [place] a symptom update.  She is on Cipro 250 twice daily for 3 days along with Diflucan.  She has had recurrent UTI and vaginitis.

## 2019-11-08 NOTE — Telephone Encounter (Signed)
Pt returned provider's call

## 2019-11-08 NOTE — Telephone Encounter (Signed)
I spoke to her about symptoms and she is tolerating the Cipro well. No vaginitis. Not using wipes, soaps and wiping gently.  Told to stop Cokes/Pepsi/teas and other irritating beverages. Still has burning at the end of urination .Seeing Urology 11/21/19 and will try to get that  appt moved up if possible. Request in to scheduler.

## 2019-11-20 DIAGNOSIS — H401133 Primary open-angle glaucoma, bilateral, severe stage: Secondary | ICD-10-CM | POA: Diagnosis not present

## 2019-11-20 NOTE — Progress Notes (Signed)
11/21/2019 11:09 AM   [name] Celeste [birth year] [id number]  Referring provider: Marval Regal, NP [address] [place],  [place] [address] Chief Complaint  Patient presents with   Dysuria    HPI: [name] is a 86 y.o. female who presents today to be evaluated and managed for persistent dysuria.   She has a personal history of right renal cyst, urinary incontinence, lack of bladder control. Patient has complained of UTI related symptoms to her PCP for 2 months now. (see details below)  She had an initial consult with her PCP on 10/03/2019. Patient had dysuria that she acknowledges as her classic urinary tract infection symptoms. Patient reported she can tolerate penicillin well. UA was positive for 1+ leukocytes, negative nitrites, positive for blood, clear yellow. Microscopic UA showed WBC 21-50 and few bacteria. Urine culture was indicative for E. Coli. Patient was treated with Keflex 500 mg twice daily for 7 days.  On 10/07/2019 she was still having a lot of pain and burning 4 days into Keflex. She was advised to stop the Keflex and begin Omnicef 300 mg 1 pill twice daily for 7 days.  Advised to begin Florastor as well as yogurt, buttermilk, cottage cheese which were good probiotics.  After completing antibiotics she contacted her PCP on 10/17/2019. She was having urinary burning and vaginal soreness. UA on 10/18/2019 showed yeast, WBC 3-6 and urine culture had no growth. Her PCP suspected her vaginal complaints werecandidiasis from recent urinary tract antibiotic. Treated with  Diflucan 150 mg 1 tabletone time a day.Repeated in 3 days. On 10/22/2019 She called in with pelvic burning and pain. A pelvic exam externally showed persistent vaginal candidiasis. Monistat 2% cream was ordered for 3-5 days.   She had a follow up with her PCP on 11/04/2019. She reported that she was prone to UTI's. She had burning with urination x 3 days. She had mild pelvic  burning. No fever or chills. No suprapubic pain or tenderness. Denied hematuria. She was passing urine normally.  She was drinking plenty.  Appetite and diet were normal.  She wore pads for urinary incontinence. She did not feel that her vagina was as irritated. She used the Monistat without a problem.  She asked for another pelvic exam.   She was advised to stop drinking the tea, coke, pepsi products. STI testing was negative. Microscopic UA showed WBC 11-20, many bacteria, mucus and calcium oxalate crystals were present. UA dipstick showed glucose and protein positive, and trace leukocytes. Urine culture indicative for citrobacter freundii resistant to amox/clavulanic and cefazolin. Treated with Cipro 250 twice daily for 3 days along with Diflucan.  (+) Urine culture: 10/03/2019: E. Coli 11/04/2019: Citrobacter freundii   She was having constant UTI symptoms that she felt would not go away. She had dysuria, and pain in her clitoris and labia. Reports her symptoms resolved 1 week ago. This is the first time she was unable to get rid of an infection with just 1 course of antibiotics.    PMH: Past Medical History:  Diagnosis Date   Adenomatous colon polyp 04/26/95   tubulovillous   Allergy    Arthritis    Bradycardia    Bursitis    Cataracts, bilateral    Constipation    COPD, moderate ([place])    "THIS WAS TOLD TO ME BACK IN THE DAYS WHEN EVERYONE THAT WAS  A SMOKER WAS TOLD THEY WERE A COPD. IM 90 AND I DONT GET SHORT OF BREATH ,  I DONT HAVE IT "   Cystitis    Degenerative joint disease    Depression    DENIES    Gastritis    GERD (gastroesophageal reflux disease)    Glaucoma    Hypoglycemia    Impaired memory    Insomnia    Lack of bladder control    Macular degeneration    OA (osteoarthritis)    Osteopenia    Renal cyst    right   Shortness of breath    Small bowel obstruction ([facility])    Trigeminal neuralgia    DENIES    Urinary incontinence     Wears dentures     Surgical History: Past Surgical History:  Procedure Laterality Date   ABDOMINAL HYSTERECTOMY  1992   nonmalignant reasons   BACK SURGERY  05/20/08   LUMBAR SPINAL FUSION    BREAST SURGERY     EXCISION OF CALCIUM DEPOSIT    CATARACT EXTRACTION Bilateral    CHOLECYSTECTOMY  1994   DILATION AND CURETTAGE OF UTERUS     EYE SURGERY     RIGHT EYE HEMORRHAGE SURGERY    JOINT REPLACEMENT  2005   LEFT HIP    PARTIAL COLECTOMY  04/26/95   tubulovillous adenoma   SHOULDER SURGERY  01/22/2009   left   TONSILLECTOMY     TOTAL HIP ARTHROPLASTY Right 10/04/2018   Procedure: TOTAL HIP ARTHROPLASTY ANTERIOR APPROACH;  Surgeon: Rod Can, MD;  Location: [facility];  Service: Orthopedics;  Laterality: Right;   TOTAL SHOULDER ARTHROPLASTY Right 07/05/2012   Procedure: RIGHT TOTAL SHOULDER ARTHROPLASTY;  Surgeon: Marin Shutter, MD;  Location: [facility];  Service: Orthopedics;  Laterality: Right;  Right total shoulder arthroplasty    Home Medications:  Allergies as of 11/21/2019      Reactions   Codeine Itching, Nausea Only   Small amounts okay   Pregabalin Other (See Comments)   Confusion and hallucinations   Promethazine Hcl Other (See Comments)   Muscle cramps   Sulfonamide Derivatives Nausea Only      Medication List       Accurate as of November 21, 2019 11:09 AM. If you have any questions, ask your nurse or doctor.        acetaminophen 325 MG tablet Commonly known as: TYLENOL Take 1-2 tablets (325-650 mg total) by mouth every 6 (six) hours as needed for mild pain (pain score 1-3 or temp > 100.5).   aspirin 81 MG chewable tablet Chew 1 tablet (81 mg total) by mouth daily.   beta carotene w/minerals tablet Take 1 tablet by mouth 2 (two) times daily.   bisacodyl 5 MG EC tablet Commonly known as: DULCOLAX Take 5 mg by mouth daily as needed for moderate constipation.   bisacodyl 10 MG suppository Commonly known as: DULCOLAX Place 10 mg rectally daily  as needed for moderate constipation.   docusate sodium 100 MG capsule Commonly known as: COLACE Take 1 capsule (100 mg total) by mouth 2 (two) times daily.   dorzolamide-timolol 22.3-6.8 MG/ML ophthalmic solution Commonly known as: COSOPT PLACE 1 DROP INTO BOTH EYES TWICE DAILY   fluconazole 150 MG tablet Commonly known as: DIFLUCAN Take one tablet today. If needed for vaginal irritation may repeat in 3 days.   ketorolac 0.5 % ophthalmic solution Commonly known as: ACULAR   latanoprost 0.005 % ophthalmic solution Commonly known as: XALATAN 1 drop at bedtime.   omeprazole 20 MG capsule Commonly known as: PRILOSEC Take 20 mg by mouth daily  before breakfast.   polyethylene glycol 17 g packet Commonly known as: MIRALAX / GLYCOLAX Take 17 g by mouth daily as needed for moderate constipation.   saccharomyces boulardii 250 MG capsule Commonly known as: Florastor Take 1 capsule (250 mg total) by mouth 2 (two) times daily.   senna 8.6 MG Tabs tablet Commonly known as: SENOKOT Take 1 tablet (8.6 mg total) by mouth 2 (two) times daily.   timolol 0.5 % ophthalmic solution Commonly known as: TIMOPTIC timolol maleate 0.5 % eye drops   Travatan Z 0.004 % Soln ophthalmic solution Generic drug: Travoprost (BAK Free) Place 1 drop into both eyes at bedtime.       Allergies:  Allergies  Allergen Reactions   Codeine Itching and Nausea Only    Small amounts okay   Pregabalin Other (See Comments)    Confusion and hallucinations   Promethazine Hcl Other (See Comments)    Muscle cramps   Sulfonamide Derivatives Nausea Only    Family History: Family History  Problem Relation Age of Onset   Cancer Father        colon   Heart disease Sister    Colon polyps Brother    Diabetes Brother     Social History:  reports that she quit smoking about 14 years ago. Her smoking use included cigarettes. She has a 50.00 pack-year smoking history. She has never used smokeless  tobacco. She reports that she does not drink alcohol and does not use drugs.   Physical Exam: BP (!) 138/76 (BP Location: Right Arm, Patient Position: Sitting, Cuff Size: Normal)    Pulse 62    Ht 5\' 2"  (1.575 m)    Wt 121 lb (54.9 kg)    BMI 22.13 kg/m   Constitutional:  Alert and oriented, No acute distress.  Accompanied by daughter. HEENT: [place] AT, moist mucus membranes.  Trachea midline, no masses. Cardiovascular: No clubbing, cyanosis, or edema. Respiratory: Normal respiratory effort, no increased work of breathing. Skin: No rashes, bruises or suspicious lesions. Neurologic: Grossly intact, no focal deficits, moving all 4 extremities. Psychiatric: Normal mood and affect.  Urinalysis unable to void today but currently asymptomatic  Assessment & Plan:    1. RUTI/ suspected atrophic vaginitis Bothersome symptoms resolved x 1 week ago.   Counseled pt on prevention techniques including probiotics, cranberry tablets and estrogen cream (pea sized amount) 3x a week on urethra tube at night time    Rx of Premarin given; reassured pt that it's safe to use estrogen cream given very low systemic absorption   Advised to return to our office specifically if she has any UTI type symptoms  Return to office as needed with any recurrence of UTI symptoms, may need further w/u or treatment if infections continue with current frequency    [facility] [address] [place], [place] [address] [phone]  I, Selena Batten, am acting as a scribe for Dr. Hollice Espy.  I have reviewed the above documentation for accuracy and completeness, and I agree with the above.   Hollice Espy, MD  I spent 45 total minutes on the day of the encounter including pre-visit review of the medical record, face-to-face time with the patient, and post visit ordering of labs/imaging/tests.

## 2019-11-21 ENCOUNTER — Other Ambulatory Visit: Payer: Self-pay

## 2019-11-21 ENCOUNTER — Ambulatory Visit (INDEPENDENT_AMBULATORY_CARE_PROVIDER_SITE_OTHER): Payer: Medicare Other | Admitting: Urology

## 2019-11-21 VITALS — BP 138/76 | HR 62 | Ht 62.0 in | Wt 121.0 lb

## 2019-11-21 DIAGNOSIS — R300 Dysuria: Secondary | ICD-10-CM | POA: Diagnosis not present

## 2019-11-21 MED ORDER — PREMARIN 0.625 MG/GM VA CREA: 1.0000 | TOPICAL_CREAM | Freq: Every day | VAGINAL | 12 refills | Status: DC

## 2019-11-21 NOTE — Patient Instructions (Signed)
We discussed the following today in clinic:  1.  I like you to start using topical estrogen cream 3 times a week, Monday Wednesday Friday.  You will use a pea-sized amount placed directly on your urethral meatus and Smir backwards towards your vagina.  This will help with UTI prevention.  2.  Please pick up cranberry tablets at the pharmacy, these are over-the-counter.  Ideally, I would like you to take these twice per day.  3.  If you have any UTI type symptoms, like you to call and make a same-day or next day appointment.  You would likely benefit from a catheterized specimen.  We can consider further treatment options or diagnostic work-up if you continue to have these infections as frequently as you have had them this past month.

## 2019-11-22 DIAGNOSIS — X32XXXA Exposure to sunlight, initial encounter: Secondary | ICD-10-CM | POA: Diagnosis not present

## 2019-11-22 DIAGNOSIS — L570 Actinic keratosis: Secondary | ICD-10-CM | POA: Diagnosis not present

## 2019-11-22 DIAGNOSIS — L821 Other seborrheic keratosis: Secondary | ICD-10-CM | POA: Diagnosis not present

## 2019-12-10 ENCOUNTER — Telehealth: Payer: Self-pay | Admitting: Nurse Practitioner

## 2019-12-10 NOTE — Telephone Encounter (Signed)
CALLED. NO ANSWER/NO VM. Pt due to schedule Medicare Annual Wellness Visit (AWV)   This should be a telephone visit only=30 minutes.  Last AWV 11/09/17; please schedule at anytime with Denisa O'Brien-Blaney at [facility].

## 2019-12-16 ENCOUNTER — Encounter (INDEPENDENT_AMBULATORY_CARE_PROVIDER_SITE_OTHER): Payer: Medicare Other | Admitting: Ophthalmology

## 2019-12-19 DIAGNOSIS — Z79891 Long term (current) use of opiate analgesic: Secondary | ICD-10-CM | POA: Diagnosis not present

## 2019-12-20 ENCOUNTER — Telehealth: Payer: Self-pay | Admitting: Nurse Practitioner

## 2019-12-20 NOTE — Progress Notes (Signed)
  Chronic Care Management   Outreach Note  12/20/2019 Name: [name] MRN: [id number] DOB: October 14, 1925  Referred by: Marval Regal, NP Reason for referral : No chief complaint on file.   An unsuccessful telephone outreach was attempted today. The patient was referred to the pharmacist for assistance with care management and care coordination.   Follow Up Plan:   Earney Hamburg Upstream Scheduler

## 2019-12-25 ENCOUNTER — Telehealth: Payer: Self-pay | Admitting: Nurse Practitioner

## 2019-12-25 NOTE — Progress Notes (Signed)
  Chronic Care Management   Outreach Note  12/25/2019 Name: [name] MRN: [id number] DOB: 03-11-1926  Referred by: Marval Regal, NP Reason for referral : No chief complaint on file.   An unsuccessful telephone outreach was attempted today. The patient was referred to the pharmacist for assistance with care management and care coordination.   Follow Up Plan:   Earney Hamburg Upstream Scheduler

## 2020-01-02 ENCOUNTER — Telehealth: Payer: Self-pay | Admitting: Nurse Practitioner

## 2020-01-02 NOTE — Progress Notes (Signed)
°  Chronic Care Management   Outreach Note  01/02/2020 Name: [name] MRN: [id number] DOB: 1925/08/12  Referred by: Marval Regal, NP Reason for referral : No chief complaint on file.   Third unsuccessful telephone outreach was attempted today. The patient was referred to the pharmacist for assistance with care management and care coordination.   Follow Up Plan:   Carley Perdue [employer] Scheduler

## 2020-01-13 ENCOUNTER — Encounter (INDEPENDENT_AMBULATORY_CARE_PROVIDER_SITE_OTHER): Payer: Self-pay | Admitting: Ophthalmology

## 2020-01-13 ENCOUNTER — Other Ambulatory Visit: Payer: Self-pay

## 2020-01-13 ENCOUNTER — Ambulatory Visit (INDEPENDENT_AMBULATORY_CARE_PROVIDER_SITE_OTHER): Payer: Medicare Other | Admitting: Ophthalmology

## 2020-01-13 DIAGNOSIS — H353134 Nonexudative age-related macular degeneration, bilateral, advanced atrophic with subfoveal involvement: Secondary | ICD-10-CM | POA: Diagnosis not present

## 2020-01-13 DIAGNOSIS — H35051 Retinal neovascularization, unspecified, right eye: Secondary | ICD-10-CM

## 2020-01-13 DIAGNOSIS — H353222 Exudative age-related macular degeneration, left eye, with inactive choroidal neovascularization: Secondary | ICD-10-CM

## 2020-01-13 DIAGNOSIS — H353220 Exudative age-related macular degeneration, left eye, stage unspecified: Secondary | ICD-10-CM | POA: Insufficient documentation

## 2020-01-13 DIAGNOSIS — H401133 Primary open-angle glaucoma, bilateral, severe stage: Secondary | ICD-10-CM | POA: Diagnosis not present

## 2020-01-13 NOTE — Progress Notes (Signed)
01/13/2020     CHIEF COMPLAINT Patient presents for Retina Follow Up   HISTORY OF PRESENT ILLNESS: [name] is a 86 y.o. female who presents to the clinic today for:   HPI    Retina Follow Up    Patient presents with  Dry AMD.  In both eyes.  This started 1 year ago.  Severity is mild.  Duration of 1 year.  Since onset it is gradually worsening.          Comments    1 Year AMD F/U OU  Pt sts [facility] OU is gradually getting worse over time. Pt denies ocular pain, flashes of light, or floaters OU.         Last edited by Rockie Neighbours, [place] on 01/13/2020  2:38 PM. (History)      Referring physician: Janith Lima, MD [place],  [place] [address]  HISTORICAL INFORMATION:   Selected notes from the MEDICAL RECORD NUMBER       CURRENT MEDICATIONS: Current Outpatient Medications (Ophthalmic Drugs)  Medication Sig  . dorzolamide-timolol (COSOPT) 22.3-6.8 MG/ML ophthalmic solution PLACE 1 DROP INTO BOTH EYES TWICE DAILY  . ketorolac (ACULAR) 0.5 % ophthalmic solution  (Patient not taking: Reported on 01/13/2020)  . latanoprost (XALATAN) 0.005 % ophthalmic solution 1 drop at bedtime.  . timolol (TIMOPTIC) 0.5 % ophthalmic solution timolol maleate 0.5 % eye drops  . Travoprost, BAK Free, (TRAVATAN Z) 0.004 % SOLN ophthalmic solution Place 1 drop into both eyes at bedtime.  (Patient not taking: Reported on 01/13/2020)   No current facility-administered medications for this visit. (Ophthalmic Drugs)   Current Outpatient Medications (Other)  Medication Sig  . acetaminophen (TYLENOL) 325 MG tablet Take 1-2 tablets (325-650 mg total) by mouth every 6 (six) hours as needed for mild pain (pain score 1-3 or temp > 100.5).  Marland Kitchen aspirin 81 MG chewable tablet Chew 1 tablet (81 mg total) by mouth daily.  . beta carotene w/minerals (OCUVITE) tablet Take 1 tablet by mouth 2 (two) times daily.  . bisacodyl (DULCOLAX) 10 MG suppository Place 10 mg rectally daily as needed for  moderate constipation.  . bisacodyl (DULCOLAX) 5 MG EC tablet Take 5 mg by mouth daily as needed for moderate constipation.  . conjugated estrogens (PREMARIN) vaginal cream Place 1 Applicatorful vaginally daily. Use pea sized amount M-W-Fr before bedtime  . docusate sodium (COLACE) 100 MG capsule Take 1 capsule (100 mg total) by mouth 2 (two) times daily.  . fluconazole (DIFLUCAN) 150 MG tablet Take one tablet today. If needed for vaginal irritation may repeat in 3 days. (Patient not taking: Reported on 11/21/2019)  . omeprazole (PRILOSEC) 20 MG capsule Take 20 mg by mouth daily before breakfast.   . polyethylene glycol (MIRALAX / GLYCOLAX) 17 g packet Take 17 g by mouth daily as needed for moderate constipation.  . saccharomyces boulardii (FLORASTOR) 250 MG capsule Take 1 capsule (250 mg total) by mouth 2 (two) times daily.  Marland Kitchen senna (SENOKOT) 8.6 MG TABS tablet Take 1 tablet (8.6 mg total) by mouth 2 (two) times daily.   No current facility-administered medications for this visit. (Other)      REVIEW OF SYSTEMS:    ALLERGIES Allergies  Allergen Reactions  . Codeine Itching and Nausea Only    Small amounts okay  . Pregabalin Other (See Comments)    Confusion and hallucinations  . Promethazine Hcl Other (See Comments)    Muscle cramps  . Sulfonamide Derivatives  Nausea Only    PAST MEDICAL HISTORY Past Medical History:  Diagnosis Date  . Adenomatous colon polyp 04/26/95   tubulovillous  . Allergy   . Arthritis   . Bradycardia   . Bursitis   . Cataracts, bilateral   . Constipation   . COPD, moderate ([place])    "THIS WAS TOLD TO ME BACK IN THE DAYS WHEN EVERYONE THAT WAS  A SMOKER WAS TOLD THEY WERE A COPD. IM 90 AND I DONT GET SHORT OF BREATH , I DONT HAVE IT "  . Cystitis   . Degenerative joint disease   . Depression    DENIES   . Gastritis   . GERD (gastroesophageal reflux disease)   . Glaucoma   . Hypoglycemia   . Impaired memory   . Insomnia   . Lack of bladder  control   . Macular degeneration   . OA (osteoarthritis)   . Osteopenia   . Renal cyst    right  . Shortness of breath   . Small bowel obstruction ([place])   . Trigeminal neuralgia    DENIES   . Urinary incontinence   . Wears dentures    Past Surgical History:  Procedure Laterality Date  . ABDOMINAL HYSTERECTOMY  1992   nonmalignant reasons  . BACK SURGERY  05/20/08   LUMBAR SPINAL FUSION   . BREAST SURGERY     EXCISION OF CALCIUM DEPOSIT   . CATARACT EXTRACTION Bilateral   . CHOLECYSTECTOMY  1994  . DILATION AND CURETTAGE OF UTERUS    . EYE SURGERY     RIGHT EYE HEMORRHAGE SURGERY   . JOINT REPLACEMENT  2005   LEFT HIP   . PARTIAL COLECTOMY  04/26/95   tubulovillous adenoma  . SHOULDER SURGERY  01/22/2009   left  . TONSILLECTOMY    . TOTAL HIP ARTHROPLASTY Right 10/04/2018   Procedure: TOTAL HIP ARTHROPLASTY ANTERIOR APPROACH;  Surgeon: Rod Can, MD;  Location: [facility];  Service: Orthopedics;  Laterality: Right;  . TOTAL SHOULDER ARTHROPLASTY Right 07/05/2012   Procedure: RIGHT TOTAL SHOULDER ARTHROPLASTY;  Surgeon: Marin Shutter, MD;  Location: [facility];  Service: Orthopedics;  Laterality: Right;  Right total shoulder arthroplasty    FAMILY HISTORY Family History  Problem Relation Age of Onset  . Cancer Father        colon  . Heart disease Sister   . Colon polyps Brother   . Diabetes Brother     SOCIAL HISTORY Social History   Tobacco Use  . Smoking status: Former Smoker    Packs/day: 1.00    Years: 50.00    Pack years: 50.00    Types: Cigarettes    Quit date: 06/28/2005    Years since quitting: 14.5  . Smokeless tobacco: Never Used  Substance Use Topics  . Alcohol use: No  . Drug use: No         OPHTHALMIC EXAM:  Base Eye Exam    Visual Acuity (ETDRS)      Right Left   Dist cc 20/60 -1 CF @ 1'   Dist ph cc NI NI   Correction: Glasses       Tonometry (Tonopen, 2:41 PM)      Right Left   Pressure 15 17       Pupils      Pupils Dark Light  Shape React APD   Right PERRL 4 3 Round Sluggish None   Left PERRL 4 3 Round Sluggish None  Visual Fields (Counting fingers)      Left Right   Restrictions Total inferior temporal, inferior nasal deficiencies Total inferior nasal deficiency       Extraocular Movement      Right Left    Full Full       Neuro/Psych    Oriented x3: Yes   Mood/Affect: Normal       Dilation    Both eyes: 1.0% Mydriacyl, 2.5% Phenylephrine @ 2:44 PM        Slit Lamp and Fundus Exam    External Exam      Right Left   External Normal Normal       Slit Lamp Exam      Right Left   Lids/Lashes Normal Normal   Conjunctiva/Sclera White and quiet White and quiet   Cornea Clear Clear   Anterior Chamber Deep and quiet Deep and quiet   Iris Round and reactive Round and reactive   Lens Posterior chamber intraocular lens, Centered posterior chamber intraocular lens Posterior chamber intraocular lens, Centered posterior chamber intraocular lens   Anterior Vitreous Normal Normal       Fundus Exam      Right Left   Posterior Vitreous Posterior vitreous detachment Posterior vitreous detachment   Disc Peripapillary atrophy, 1+ Optic disc atrophy, 1+ Pallor Peripapillary atrophy, 1+ Optic disc atrophy, 1+ Pallor   C/D Ratio 0.9 0.9   Macula Geographic atrophy Geographic atrophy   Vessels Normal Normal   Periphery Normal Normal          IMAGING AND PROCEDURES  Imaging and Procedures for 01/13/20           ASSESSMENT/PLAN:  Primary open angle glaucoma of both eyes, severe stage Patient continues under the management and care of Dr. Dionne Ano      ICD-10-CM   1. Exudative age-related macular degeneration of left eye with inactive choroidal neovascularization ([place])  H35.3222   2. Advanced nonexudative age-related macular degeneration of both eyes with subfoveal involvement  H35.3134   3. Choroidal neovascularization of right eye  H35.051   4. Primary open angle glaucoma of both  eyes, severe stage  H40.1133     1.  2.  3.  Ophthalmic Meds Ordered this visit:  No orders of the defined types were placed in this encounter.      Return in about 1 year (around 01/12/2021) for DILATE OU, COLOR FP, OCT.  Patient Instructions  Patient probably of new onset visual acuity decline or distortions particularly attention to the right eye    Explained the diagnoses, plan, and follow up with the patient and they expressed understanding.  Patient expressed understanding of the importance of proper follow up care.   Clent Demark Daylen Hack M.D. Diseases & Surgery of the Retina and Vitreous Retina & Diabetic [place] 01/13/20     Abbreviations: M myopia (nearsighted); A astigmatism; H hyperopia (farsighted); P presbyopia; Mrx spectacle prescription;  CTL contact lenses; OD right eye; OS left eye; OU both eyes  XT exotropia; ET esotropia; PEK punctate epithelial keratitis; PEE punctate epithelial erosions; DES dry eye syndrome; MGD meibomian gland dysfunction; ATs artificial tears; PFAT's preservative free artificial tears; [place] nuclear sclerotic cataract; PSC posterior subcapsular cataract; ERM epi-retinal membrane; PVD posterior vitreous detachment; RD retinal detachment; DM diabetes mellitus; DR diabetic retinopathy; NPDR non-proliferative diabetic retinopathy; PDR proliferative diabetic retinopathy; CSME clinically significant macular edema; DME diabetic macular edema; dbh dot blot hemorrhages; CWS cotton wool spot; POAG primary open angle glaucoma; C/D  cup-to-disc ratio; HVF humphrey visual field; GVF goldmann visual field; OCT optical coherence tomography; IOP intraocular pressure; BRVO Branch retinal vein occlusion; CRVO central retinal vein occlusion; CRAO central retinal artery occlusion; BRAO branch retinal artery occlusion; RT retinal tear; SB scleral buckle; PPV pars plana vitrectomy; VH Vitreous hemorrhage; PRP panretinal laser photocoagulation; IVK intravitreal kenalog;  VMT vitreomacular traction; MH Macular hole;  NVD neovascularization of the disc; NVE neovascularization elsewhere; AREDS age related eye disease study; ARMD age related macular degeneration; POAG primary open angle glaucoma; EBMD epithelial/anterior basement membrane dystrophy; ACIOL anterior chamber intraocular lens; IOL intraocular lens; PCIOL posterior chamber intraocular lens; Phaco/IOL phacoemulsification with intraocular lens placement; [place] photorefractive keratectomy; LASIK laser assisted in situ keratomileusis; HTN hypertension; DM diabetes mellitus; COPD chronic obstructive pulmonary disease

## 2020-01-13 NOTE — Patient Instructions (Signed)
Patient probably of new onset visual acuity decline or distortions particularly attention to the right eye

## 2020-01-13 NOTE — Assessment & Plan Note (Signed)
Patient continues under the management and care of Dr. Dionne Ano

## 2020-01-29 DIAGNOSIS — Z23 Encounter for immunization: Secondary | ICD-10-CM | POA: Diagnosis not present

## 2020-03-23 DIAGNOSIS — L821 Other seborrheic keratosis: Secondary | ICD-10-CM | POA: Diagnosis not present

## 2020-03-23 DIAGNOSIS — L820 Inflamed seborrheic keratosis: Secondary | ICD-10-CM | POA: Diagnosis not present

## 2020-03-23 DIAGNOSIS — L570 Actinic keratosis: Secondary | ICD-10-CM | POA: Diagnosis not present

## 2020-03-23 DIAGNOSIS — L814 Other melanin hyperpigmentation: Secondary | ICD-10-CM | POA: Diagnosis not present

## 2020-04-21 DIAGNOSIS — Z79891 Long term (current) use of opiate analgesic: Secondary | ICD-10-CM | POA: Diagnosis not present

## 2020-04-21 DIAGNOSIS — Z79899 Other long term (current) drug therapy: Secondary | ICD-10-CM | POA: Diagnosis not present

## 2020-04-21 DIAGNOSIS — M5416 Radiculopathy, lumbar region: Secondary | ICD-10-CM | POA: Diagnosis not present

## 2020-04-21 DIAGNOSIS — Z5181 Encounter for therapeutic drug level monitoring: Secondary | ICD-10-CM | POA: Diagnosis not present

## 2020-05-18 DIAGNOSIS — L570 Actinic keratosis: Secondary | ICD-10-CM | POA: Diagnosis not present

## 2020-05-18 DIAGNOSIS — L821 Other seborrheic keratosis: Secondary | ICD-10-CM | POA: Diagnosis not present

## 2020-05-18 DIAGNOSIS — L814 Other melanin hyperpigmentation: Secondary | ICD-10-CM | POA: Diagnosis not present

## 2020-06-09 IMAGING — CT CT OF THE RIGHT FEMUR WITHOUT CONTRAST
2 of 3 series · 12 of 33 positions shown, 15 images · non-contrast
Comparison: X-ray 11/01/2018

CLINICAL DATA: Right hip pain.  Difficulty with weight-bearing.

EXAM:
CT OF THE LOWER RIGHT EXTREMITY WITHOUT CONTRAST
TECHNIQUE: Multidetector CT imaging of the right lower extremity was performed
according to the standard protocol.

[Series 4: axial (person_name) (person_name) · axial · 0.33mm/px · z∈[+660,+1054]mm · 9 of 233 slices shown, 12 images]
[im 18/233  soft-tissue]
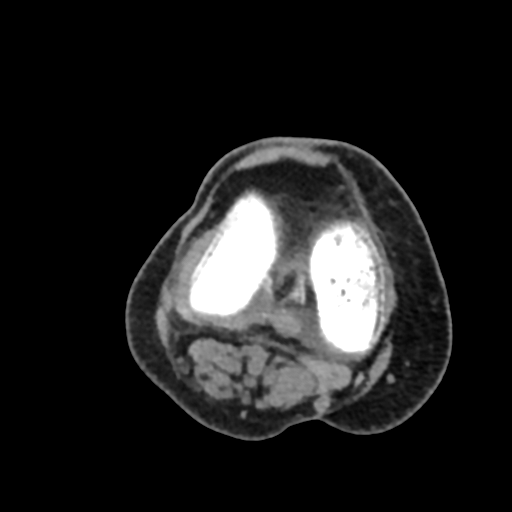
[im 18/233  bone]
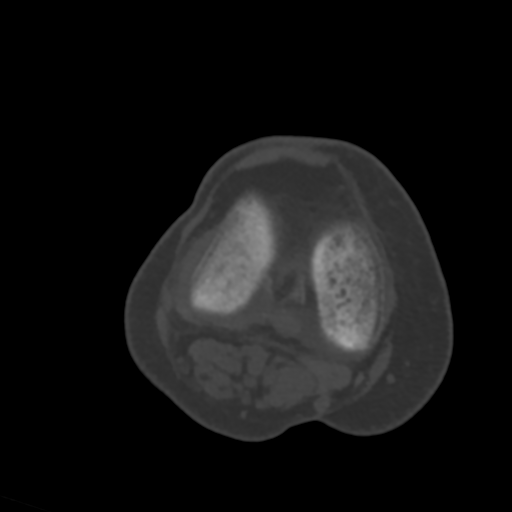
[im 54/233  bone]
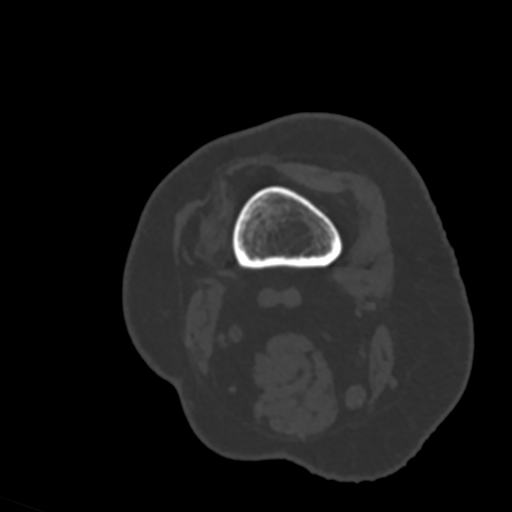
[im 72/233  bone]
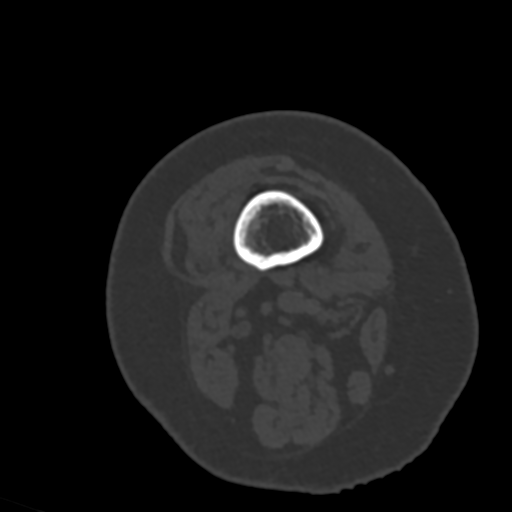
[im 90/233  bone]
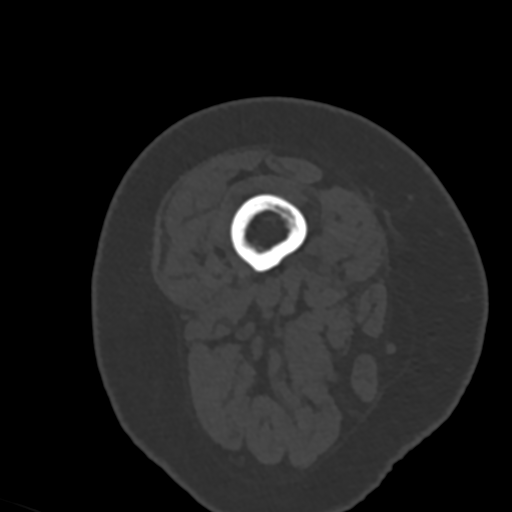
[im 125/233  soft-tissue]
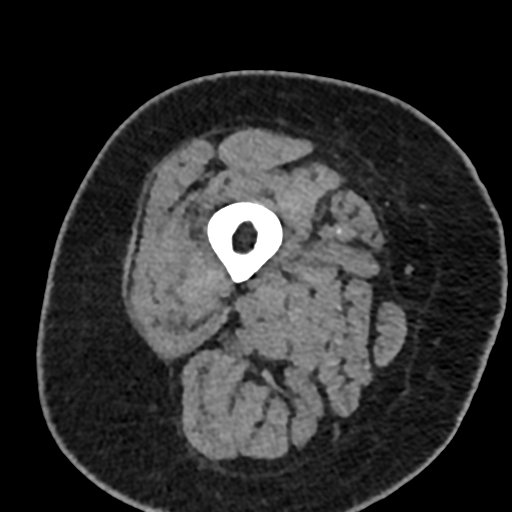
[im 125/233  bone]
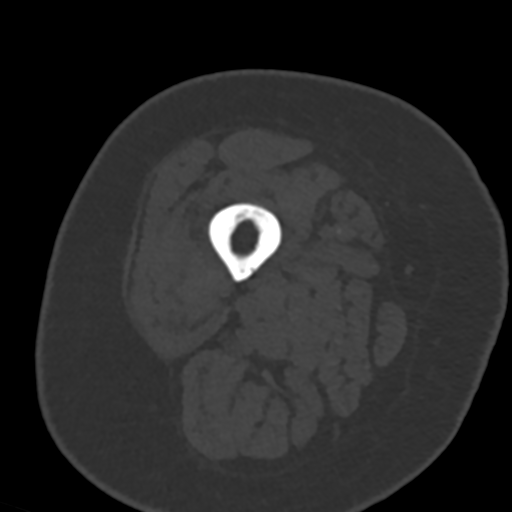
[im 143/233  bone]
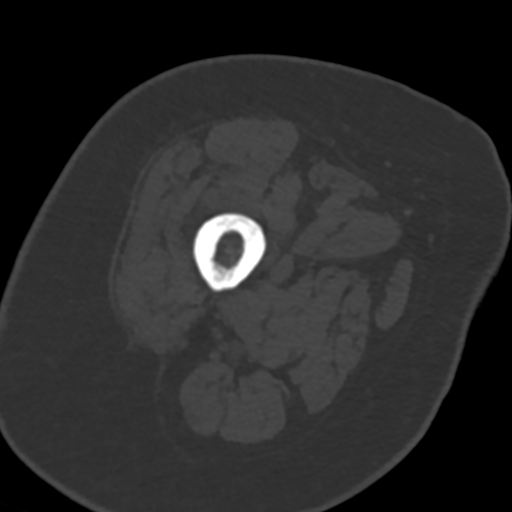
[im 161/233  bone]
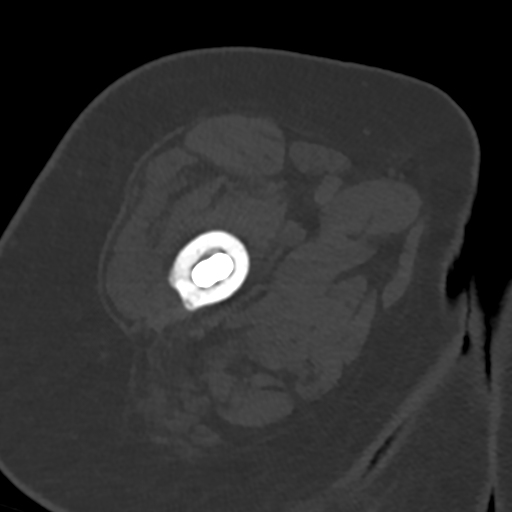
[im 197/233  bone]
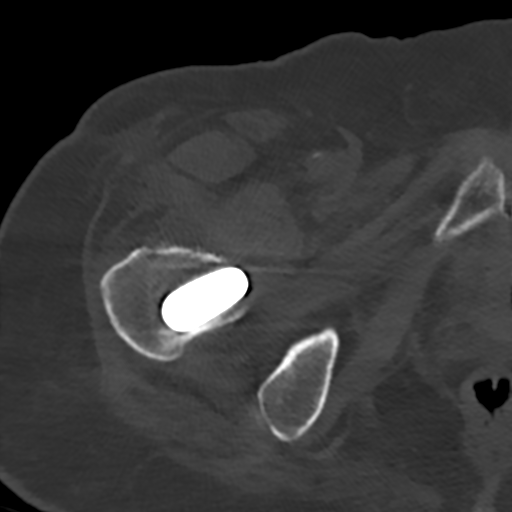
[im 215/233  soft-tissue]
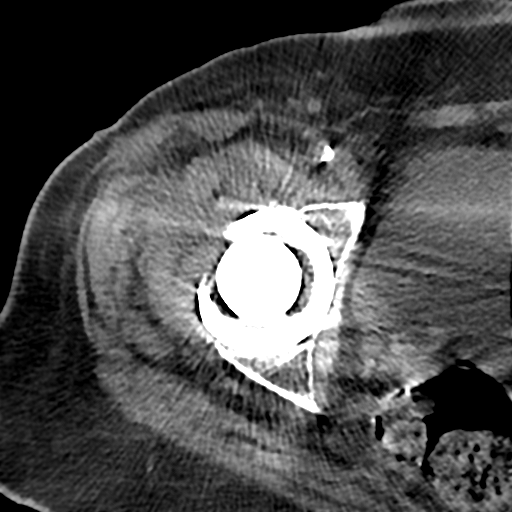
[im 215/233  bone]
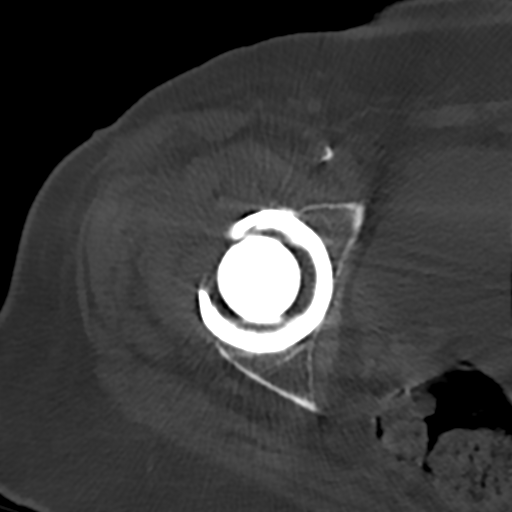

[Series 8: coronal st · coronal · 0.39mm/px · 3 of 85 slices shown]
[im 17/85  bone]
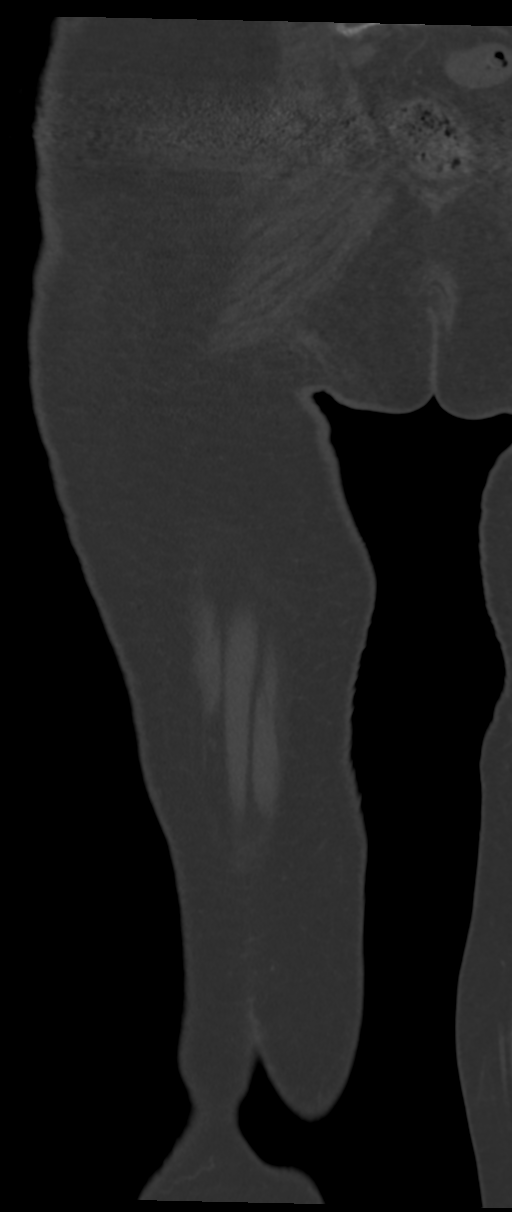
[im 34/85  bone]
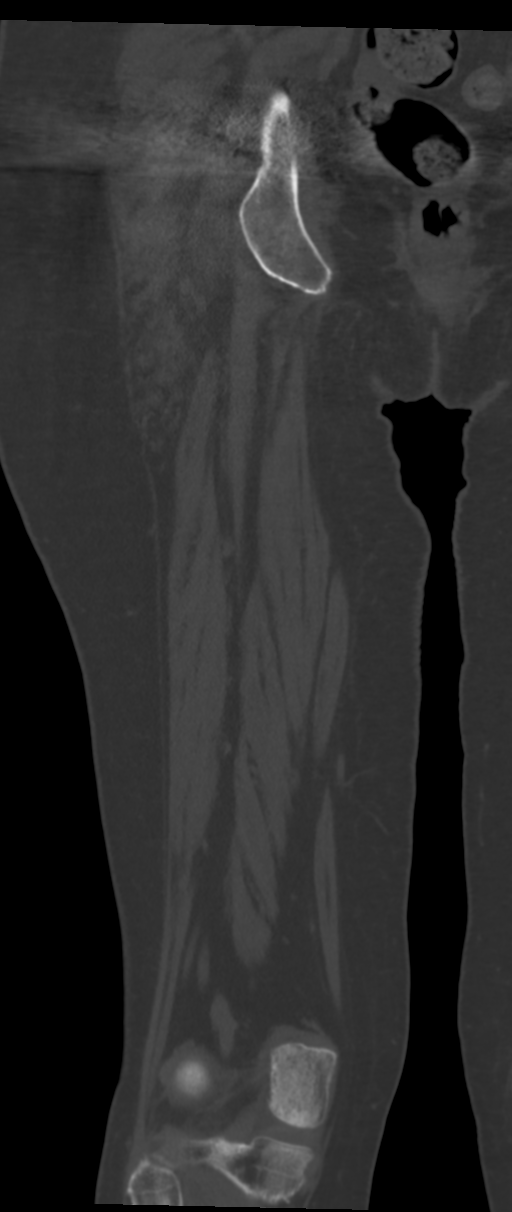
[im 51/85  bone]
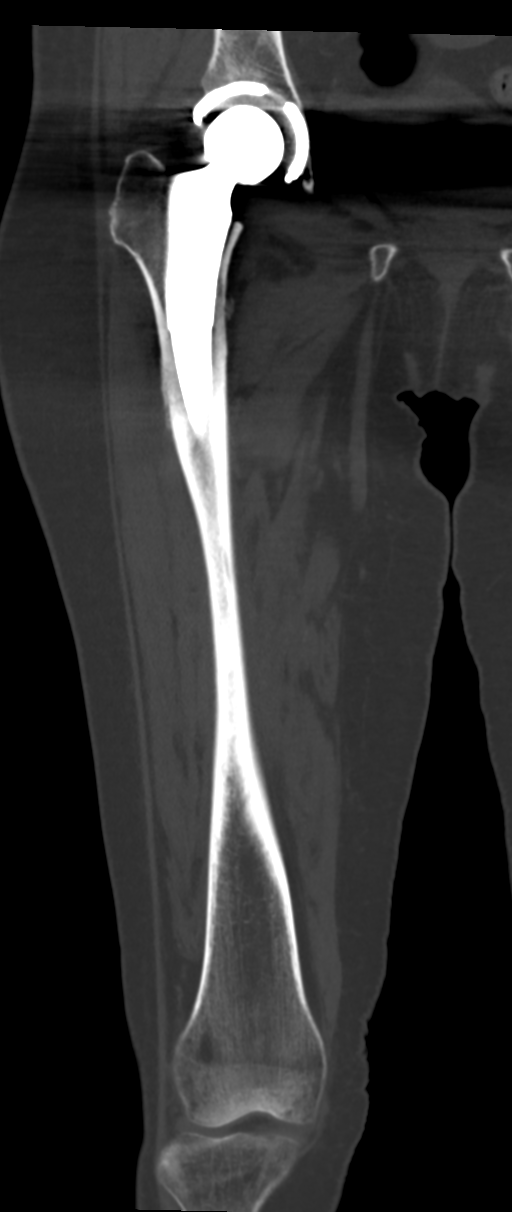

[12 of 33 positions shown; findings below may reference images not displayed]

FINDINGS: Bones/Joint/Cartilage

There is a nondisplaced spiral fracture extending from just below
the base of the greater trochanter laterally to the anterior cortex
of the proximal femoral diaphysis at the level of the femoral
arthroplasty tip (series 6, images 27-35). There is periosteal new
bone formation with early bridging callus at the fracture site. The
arthroplasty hardware appears well seated without periprosthetic
lucency or endosteal scalloping. Hardware alignment is normal. The
remaining osseous structures are intact without additional fracture.
There are mild tricompartmental degenerative changes of the right.

Ligaments

Suboptimally assessed by CT.

Muscles and Tendons

Grossly intact. No intramuscular fluid collection. Relatively
preserved muscular bulk.

Soft tissues

No appreciable periprosthetic fluid collection. Postsurgical
scarring at the anterolateral aspect of the proximal right femur. No
postoperative fluid collections. No right inguinal lymphadenopathy.
No right knee joint effusion.
IMPRESSION: 1. Nondisplaced periprosthetic fracture of the proximal right femur
with early bridging callus formation suggesting a subacute process.
2. Arthroplasty components are intact in their expected alignment
without evidence of osteolysis.

These results will be called to the ordering clinician or
representative by the Radiologist Assistant, and communication
documented in the PACS or zVision Dashboard.

## 2020-06-15 DIAGNOSIS — L814 Other melanin hyperpigmentation: Secondary | ICD-10-CM | POA: Diagnosis not present

## 2020-06-15 DIAGNOSIS — L820 Inflamed seborrheic keratosis: Secondary | ICD-10-CM | POA: Diagnosis not present

## 2020-06-15 DIAGNOSIS — L570 Actinic keratosis: Secondary | ICD-10-CM | POA: Diagnosis not present

## 2020-06-18 DIAGNOSIS — H401133 Primary open-angle glaucoma, bilateral, severe stage: Secondary | ICD-10-CM | POA: Diagnosis not present

## 2020-07-13 DIAGNOSIS — L820 Inflamed seborrheic keratosis: Secondary | ICD-10-CM | POA: Diagnosis not present

## 2020-07-13 DIAGNOSIS — L821 Other seborrheic keratosis: Secondary | ICD-10-CM | POA: Diagnosis not present

## 2020-07-13 DIAGNOSIS — L814 Other melanin hyperpigmentation: Secondary | ICD-10-CM | POA: Diagnosis not present

## 2020-07-13 DIAGNOSIS — L570 Actinic keratosis: Secondary | ICD-10-CM | POA: Diagnosis not present

## 2020-10-14 DIAGNOSIS — H401133 Primary open-angle glaucoma, bilateral, severe stage: Secondary | ICD-10-CM | POA: Diagnosis not present

## 2020-11-05 DIAGNOSIS — G894 Chronic pain syndrome: Secondary | ICD-10-CM | POA: Diagnosis not present

## 2020-12-10 DIAGNOSIS — S76302A Unspecified injury of muscle, fascia and tendon of the posterior muscle group at thigh level, left thigh, initial encounter: Secondary | ICD-10-CM | POA: Diagnosis not present

## 2020-12-10 DIAGNOSIS — N390 Urinary tract infection, site not specified: Secondary | ICD-10-CM | POA: Diagnosis not present

## 2020-12-10 DIAGNOSIS — R300 Dysuria: Secondary | ICD-10-CM | POA: Diagnosis not present

## 2021-01-05 DIAGNOSIS — R300 Dysuria: Secondary | ICD-10-CM | POA: Diagnosis not present

## 2021-01-05 DIAGNOSIS — K219 Gastro-esophageal reflux disease without esophagitis: Secondary | ICD-10-CM | POA: Diagnosis not present

## 2021-01-05 DIAGNOSIS — N390 Urinary tract infection, site not specified: Secondary | ICD-10-CM | POA: Diagnosis not present

## 2021-01-12 ENCOUNTER — Encounter (INDEPENDENT_AMBULATORY_CARE_PROVIDER_SITE_OTHER): Payer: Medicare Other | Admitting: Ophthalmology

## 2021-01-13 ENCOUNTER — Encounter (INDEPENDENT_AMBULATORY_CARE_PROVIDER_SITE_OTHER): Payer: Medicare Other | Admitting: Ophthalmology

## 2021-01-18 DIAGNOSIS — L821 Other seborrheic keratosis: Secondary | ICD-10-CM | POA: Diagnosis not present

## 2021-01-18 DIAGNOSIS — L570 Actinic keratosis: Secondary | ICD-10-CM | POA: Diagnosis not present

## 2021-01-18 DIAGNOSIS — L814 Other melanin hyperpigmentation: Secondary | ICD-10-CM | POA: Diagnosis not present

## 2021-01-20 ENCOUNTER — Other Ambulatory Visit: Payer: Self-pay

## 2021-01-20 ENCOUNTER — Emergency Department: Payer: Medicare Other

## 2021-01-20 ENCOUNTER — Emergency Department
Admission: EM | Admit: 2021-01-20 | Discharge: 2021-01-20 | Disposition: A | Discharge status: Home / Self Care | Payer: Medicare Other | Attending: Emergency Medicine | Admitting: Emergency Medicine

## 2021-01-20 DIAGNOSIS — N184 Chronic kidney disease, stage 4 (severe): Secondary | ICD-10-CM | POA: Insufficient documentation

## 2021-01-20 DIAGNOSIS — J449 Chronic obstructive pulmonary disease, unspecified: Secondary | ICD-10-CM | POA: Diagnosis not present

## 2021-01-20 DIAGNOSIS — R112 Nausea with vomiting, unspecified: Secondary | ICD-10-CM | POA: Diagnosis not present

## 2021-01-20 DIAGNOSIS — R52 Pain, unspecified: Secondary | ICD-10-CM | POA: Diagnosis not present

## 2021-01-20 DIAGNOSIS — R519 Headache, unspecified: Secondary | ICD-10-CM | POA: Diagnosis not present

## 2021-01-20 DIAGNOSIS — Z87891 Personal history of nicotine dependence: Secondary | ICD-10-CM | POA: Insufficient documentation

## 2021-01-20 DIAGNOSIS — I10 Essential (primary) hypertension: Secondary | ICD-10-CM | POA: Diagnosis not present

## 2021-01-20 DIAGNOSIS — R110 Nausea: Secondary | ICD-10-CM | POA: Diagnosis not present

## 2021-01-20 DIAGNOSIS — Z7982 Long term (current) use of aspirin: Secondary | ICD-10-CM | POA: Diagnosis not present

## 2021-01-20 DIAGNOSIS — R0902 Hypoxemia: Secondary | ICD-10-CM | POA: Diagnosis not present

## 2021-01-20 DIAGNOSIS — Z96643 Presence of artificial hip joint, bilateral: Secondary | ICD-10-CM | POA: Diagnosis not present

## 2021-01-20 DIAGNOSIS — M4312 Spondylolisthesis, cervical region: Secondary | ICD-10-CM | POA: Diagnosis not present

## 2021-01-20 DIAGNOSIS — Z96611 Presence of right artificial shoulder joint: Secondary | ICD-10-CM | POA: Diagnosis not present

## 2021-01-20 DIAGNOSIS — Z20822 Contact with and (suspected) exposure to covid-19: Secondary | ICD-10-CM | POA: Insufficient documentation

## 2021-01-20 DIAGNOSIS — G4489 Other headache syndrome: Secondary | ICD-10-CM | POA: Diagnosis not present

## 2021-01-20 DIAGNOSIS — S199XXA Unspecified injury of neck, initial encounter: Secondary | ICD-10-CM | POA: Diagnosis not present

## 2021-01-20 LAB — COMPREHENSIVE METABOLIC PANEL
ALT: 15 U/L (ref 0–44)
AST: 17 U/L (ref 15–41)
Albumin: 3.6 g/dL (ref 3.5–5.0)
Alkaline Phosphatase: 56 U/L (ref 38–126)
Anion gap: 10 (ref 5–15)
BUN: 15 mg/dL (ref 8–23)
CO2: 21 mmol/L — ABNORMAL LOW (ref 22–32)
Calcium: 9.7 mg/dL (ref 8.9–10.3)
Chloride: 107 mmol/L (ref 98–111)
Creatinine, Ser: 0.67 mg/dL (ref 0.44–1.00)
GFR, Estimated: >60 mL/min (ref >60)
Glucose, Bld: 133 mg/dL — ABNORMAL HIGH (ref 70–99)
Potassium: 4.1 mmol/L (ref 3.5–5.1)
Sodium: 138 mmol/L (ref 135–145)
Total Bilirubin: 0.7 mg/dL (ref 0.3–1.2)
Total Protein: 7.3 g/dL (ref 6.5–8.1)

## 2021-01-20 LAB — CBC
HCT: 37.8 % (ref 36.0–46.0)
Hemoglobin: 13 g/dL (ref 12.0–15.0)
MCH: 30.9 pg (ref 26.0–34.0)
MCHC: 34.4 g/dL (ref 30.0–36.0)
MCV: 89.8 fL (ref 80.0–100.0)
Platelets: 313 10*3/uL (ref 150–400)
RBC: 4.21 MIL/uL (ref 3.87–5.11)
RDW: 13.6 % (ref 11.5–15.5)
WBC: 12.6 10*3/uL — ABNORMAL HIGH (ref 4.0–10.5)
nRBC: 0 % (ref 0.0–0.2)

## 2021-01-20 LAB — LACTIC ACID, PLASMA: Lactic Acid, Venous: 0.9 mmol/L (ref 0.5–1.9)

## 2021-01-20 LAB — PROTIME-INR
INR: 1 (ref 0.8–1.2)
Prothrombin Time: 13 seconds (ref 11.4–15.2)

## 2021-01-20 LAB — RESP PANEL BY RT-PCR (FLU A&B, COVID) ARPGX2
Influenza A by PCR: NEGATIVE
Influenza B by PCR: NEGATIVE
SARS Coronavirus 2 by RT PCR: NEGATIVE

## 2021-01-20 LAB — APTT: aPTT: 28 seconds (ref 24–36)

## 2021-01-20 MED ORDER — ONDANSETRON 4 MG PO TBDP
4.0000 mg | ORAL_TABLET | Freq: Once | ORAL | Status: AC
  Administered 2021-01-20: 4 mg via ORAL
  Filled 2021-01-20: qty 1

## 2021-01-20 MED ORDER — BUTALBITAL-APAP-CAFFEINE 50-325-40 MG PO TABS
2.0000 | ORAL_TABLET | Freq: Once | ORAL | Status: AC
  Administered 2021-01-20: 2 via ORAL
  Filled 2021-01-20: qty 2

## 2021-01-20 NOTE — ED Provider Notes (Signed)
[facility] Emergency Department Provider Note  Time seen: 11:19 AM  I have reviewed the triage vital signs and the nursing notes.   HISTORY  Chief Complaint Headache  HPI [name] is a 87 y.o. female with a past medical history of arthritis, gastric reflux, trigeminal neuralgia, presents to the emergency department for headache and nausea.  According to the patient she woke this morning with a headache and feeling nauseated.  Patient denies any weakness or numbness of any arm or leg confusion or slurred speech.  Denies any head injuries.  Denies any cough congestion or shortness of breath.  Patient states she has not taken anything for discomfort or headache this morning.  Describes the headache as moderate and global.  No neck pain.   Past Medical History:  Diagnosis Date   Adenomatous colon polyp 04/26/95   tubulovillous   Allergy    Arthritis    Bradycardia    Bursitis    Cataracts, bilateral    Constipation    COPD, moderate ([place])    "THIS WAS TOLD TO ME BACK IN THE DAYS WHEN EVERYONE THAT WAS  A SMOKER WAS TOLD THEY WERE A COPD. IM 89 AND I DONT GET SHORT OF BREATH , I DONT HAVE IT "   Cystitis    Degenerative joint disease    Depression    DENIES    Gastritis    GERD (gastroesophageal reflux disease)    Glaucoma    Hypoglycemia    Impaired memory    Insomnia    Lack of bladder control    Macular degeneration    OA (osteoarthritis)    Osteopenia    Renal cyst    right   Shortness of breath    Small bowel obstruction (HCC)    Trigeminal neuralgia    DENIES    Urinary incontinence    Wears dentures     Patient Active Problem List   Diagnosis Date Noted   Exudative age-related macular degeneration of left eye ([place]) 01/13/2020   Advanced nonexudative age-related macular degeneration of both eyes with subfoveal involvement 01/13/2020   Choroidal neovascularization of right eye 01/13/2020   Primary open angle glaucoma of both eyes,  severe stage 01/13/2020   Chronic renal disease, stage 4, severely decreased glomerular filtration rate (GFR) between 15-29 mL/min/1.73 square meter ([place]) 03/04/2019   Generalized abdominal tenderness without rebound tenderness 03/04/2019   UTI (urinary tract infection) 11/01/2018   Osteoarthritis of right hip 10/04/2018   TSH elevation 09/24/2018   Frequent UTI 11/09/2017   Leukocytosis 02/06/2016   Dysuria 06/05/2015   Hyperlipidemia with target LDL less than 160 09/24/2014   Routine general medical examination at a health care facility 09/24/2014   MACULAR DEGENERATION 08/07/2008   Glaucoma 08/07/2008   Insomnia 08/07/2008   Constipation 07/22/2008   NEURALGIA, TRIGEMINAL 01/07/2008   Allergic rhinitis 09/03/2007   COPD 09/03/2007   GERD 09/03/2007   Osteoarthritis 09/03/2007    Past Surgical History:  Procedure Laterality Date   ABDOMINAL HYSTERECTOMY  1992   nonmalignant reasons   BACK SURGERY  05/20/08   LUMBAR SPINAL FUSION    BREAST SURGERY     EXCISION OF CALCIUM DEPOSIT    CATARACT EXTRACTION Bilateral    CHOLECYSTECTOMY  1994   DILATION AND CURETTAGE OF UTERUS     EYE SURGERY     RIGHT EYE HEMORRHAGE SURGERY    JOINT REPLACEMENT  2005   LEFT HIP    PARTIAL COLECTOMY  04/26/95   tubulovillous adenoma   SHOULDER SURGERY  01/22/2009   left   TONSILLECTOMY     TOTAL HIP ARTHROPLASTY Right 10/04/2018   Procedure: TOTAL HIP ARTHROPLASTY ANTERIOR APPROACH;  Surgeon: Rod Can, MD;  Location: [facility];  Service: Orthopedics;  Laterality: Right;   TOTAL SHOULDER ARTHROPLASTY Right 07/05/2012   Procedure: RIGHT TOTAL SHOULDER ARTHROPLASTY;  Surgeon: Marin Shutter, MD;  Location: [place];  Service: Orthopedics;  Laterality: Right;  Right total shoulder arthroplasty    Prior to Admission medications   Medication Sig Start Date End Date Taking? Authorizing Provider  acetaminophen (TYLENOL) 325 MG tablet Take 1-2 tablets (325-650 mg total) by mouth every 6 (six) hours as  needed for mild pain (pain score 1-3 or temp > 100.5). 10/05/18  Yes Swinteck, Aaron Edelman, MD  aspirin 81 MG chewable tablet Chew 1 tablet (81 mg total) by mouth daily. 11/05/18  Yes Sudini, Alveta Heimlich, MD  beta carotene w/minerals (OCUVITE) tablet Take 1 tablet by mouth 2 (two) times daily.   Yes [provider]  bisacodyl (DULCOLAX) 10 MG suppository Place 10 mg rectally daily as needed for moderate constipation.   Yes [provider]  bisacodyl (DULCOLAX) 5 MG EC tablet Take 5 mg by mouth daily as needed for moderate constipation.   Yes [provider]  docusate sodium (COLACE) 100 MG capsule Take 1 capsule (100 mg total) by mouth 2 (two) times daily. 10/05/18  Yes Swinteck, Aaron Edelman, MD  dorzolamide-timolol (COSOPT) 22.3-6.8 MG/ML ophthalmic solution PLACE 1 DROP INTO BOTH EYES TWICE DAILY 01/01/19  Yes [provider]  famotidine (PEPCID) 20 MG tablet Take 20 mg by mouth at bedtime. 01/05/21  Yes [provider]  latanoprost (XALATAN) 0.005 % ophthalmic solution 1 drop at bedtime. 11/20/19  Yes [provider]  morphine (MSIR) 15 MG tablet Take 15 mg by mouth 5 (five) times daily as needed. 01/18/21  Yes [provider]  omeprazole (PRILOSEC) 20 MG capsule Take 20 mg by mouth daily before breakfast.    Yes [provider]  polyethylene glycol (MIRALAX / GLYCOLAX) 17 g packet Take 17 g by mouth daily as needed for moderate constipation.   Yes [provider]  senna (SENOKOT) 8.6 MG TABS tablet Take 1 tablet (8.6 mg total) by mouth 2 (two) times daily. 10/05/18  Yes Swinteck, Aaron Edelman, MD  timolol (TIMOPTIC) 0.5 % ophthalmic solution timolol maleate 0.5 % eye drops   Yes [provider]  conjugated estrogens (PREMARIN) vaginal cream Place 1 Applicatorful vaginally daily. Use pea sized amount M-W-Fr before bedtime 11/21/19   Hollice Espy, MD  fluconazole (DIFLUCAN) 150 MG tablet Take one tablet today. If needed for vaginal  irritation may repeat in 3 days. Patient not taking: No sig reported 11/06/19   Marval Regal, NP  ketorolac (ACULAR) 0.5 % ophthalmic solution  11/20/19   [provider]  saccharomyces boulardii (FLORASTOR) 250 MG capsule Take 1 capsule (250 mg total) by mouth 2 (two) times daily. Patient not taking: No sig reported 10/08/19   Denice Paradise A, NP  Travoprost, BAK Free, (TRAVATAN) 0.004 % SOLN ophthalmic solution Place 1 drop into both eyes at bedtime.  Patient not taking: No sig reported 10/24/16   [provider]    Allergies  Allergen Reactions   Codeine Itching and Nausea Only    Small amounts okay   Pregabalin Other (See Comments)    Confusion and hallucinations   Promethazine Hcl Other (See Comments)  Muscle cramps   Sulfonamide Derivatives Nausea Only    Family History  Problem Relation Age of Onset   Cancer Father        colon   Heart disease Sister    Colon polyps Brother    Diabetes Brother     Social History Social History   Tobacco Use   Smoking status: Former    Packs/day: 1.00    Years: 50.00    Pack years: 50.00    Types: Cigarettes    Quit date: 06/28/2005    Years since quitting: 15.5   Smokeless tobacco: Never  Substance Use Topics   Alcohol use: No   Drug use: No    Review of Systems Constitutional: Negative for fever. Cardiovascular: Negative for chest pain. Respiratory: Negative for shortness of breath. Gastrointestinal: Negative for abdominal pain.  Negative for vomiting or diarrhea but positive for nausea. Genitourinary: Negative for urinary compaints Musculoskeletal: Negative for musculoskeletal complaints Neurological: Moderate global headache.  Denies any weakness numbness confusion or slurred speech. All other ROS negative  ____________________________________________   PHYSICAL EXAM:  VITAL SIGNS: ED Triage Vitals  Enc Vitals Group     BP 01/20/21 0747 (!) 172/72     Pulse Rate 01/20/21 0747 70     Resp  01/20/21 0747 18     Temp 01/20/21 0747 98.2 F (36.8 C)     Temp Source 01/20/21 0747 Oral     SpO2 01/20/21 0747 94 %     Weight 01/20/21 0746 124 lb (56.2 kg)     Height 01/20/21 0746 5\' 2"  (1.575 m)     Head Circumference --      Peak Flow --      Pain Score 01/20/21 0746 7     Pain Loc --      Pain Edu? --      Excl. in [place]? --    Constitutional: Alert and oriented. Well appearing and in no distress. Eyes: Normal exam ENT      Head: Normocephalic and atraumatic.      Mouth/Throat: Mucous membranes are moist. Cardiovascular: Normal rate, regular rhythm.  Respiratory: Normal respiratory effort without tachypnea nor retractions. Breath sounds are clear  Gastrointestinal: Soft and nontender. No distention. Musculoskeletal: Nontender with normal range of motion in all extremities.  Neurologic:  Normal speech and language. No gross focal neurologic deficits  Skin:  Skin is warm, dry and intact.  Psychiatric: Mood and affect are normal.  ____________________________________________     RADIOLOGY  CT scan of the head and C-spine are negative for acute abnormality.  ____________________________________________   INITIAL IMPRESSION / ASSESSMENT AND PLAN / ED COURSE  Pertinent labs & imaging results that were available during my care of the patient were reviewed by me and considered in my medical decision making (see chart for details).   Patient presents emergency department for headache and nausea since awakening this morning.  Patient states she woke around 530 this morning with a headache and feeling nauseated.  Patient denies any cough or shortness of breath.  Denies any vomiting or diarrhea.  Patient states she does not typically get headaches.  Patient's work-up is quite reassuring.  Physical exam is reassuring.  CT scans are negative for acute abnormality.  Lab work is largely within normal limits.  We will treat the patient's headache with Fioricet, nausea with Zofran and  obtain a COVID PCR as a precaution.  Patient agreeable to plan of care.  COVID/influenza negative.  Patient states her  headache is now gone and she is feeling much better.  We will discharge from the emergency department.  I discussed following up with her PCP.  Patient agreeable to plan of care.  ERLENE DEVITA was evaluated in Emergency Department on 01/20/2021 for the symptoms described in the history of present illness. She was evaluated in the context of the global COVID-19 pandemic, which necessitated consideration that the patient might be at risk for infection with the SARS-CoV-2 virus that causes COVID-19. Institutional protocols and algorithms that pertain to the evaluation of patients at risk for COVID-19 are in a state of rapid change based on information released by regulatory bodies including the CDC and federal and state organizations. These policies and algorithms were followed during the patient's care in the ED.  ____________________________________________   FINAL CLINICAL IMPRESSION(S) / ED DIAGNOSES  Headache Nausea   Harvest Dark, MD 01/20/21 1354

## 2021-01-20 NOTE — ED Notes (Signed)
See triage note.

## 2021-01-20 NOTE — ED Provider Notes (Signed)
Emergency Medicine Provider Triage Evaluation Note  [name] , a 87 y.o. female  was evaluated in triage.  Pt complains of a cut with a severe headache.  Patient did have a fall where she sat down on the toilet and hit her head against the bathroom wall.  Happened couple of days ago..  Review of Systems  Positive: Headache, nausea/vomiting Negative: Chest pain, shortness of breath, LOC  Physical Exam  BP (!) 172/72   Pulse 70   Temp 98.2 F (36.8 C) (Oral)   Resp 18   Ht 5\' 2"  (1.575 m)   Wt 56.2 kg   SpO2 94%   BMI 22.68 kg/m  Gen:   Awake, no distress   Resp:  Normal effort  MSK:   Moves extremities without difficulty  Other:    Medical Decision Making  Medically screening exam initiated at 7:48 AM.  Appropriate orders placed.  AZALEE WEIMER was informed that the remainder of the evaluation will be completed by another provider, this initial triage assessment does not replace that evaluation, and the importance of remaining in the ED until their evaluation is complete.     Versie Starks, PA-C 01/20/21 [id number]    Blake Divine, MD 01/20/21 [phone]

## 2021-01-20 NOTE — ED Notes (Signed)
Reviewed discharge instructions with patient and care giver  pt is not able to be picked up until 330 pm

## 2021-01-20 NOTE — ED Triage Notes (Signed)
Pt to ED [facility] from [facility] for headache that started this am . Alert and oriented.  Per ems, requested heat ct Reports recent fall sliding down wall, no hitting head or loc, no blood thinners . Pain management clinic pt.  MSE Manuela Schwartz PA Pt vision impaired

## 2021-01-22 ENCOUNTER — Ambulatory Visit (INDEPENDENT_AMBULATORY_CARE_PROVIDER_SITE_OTHER): Payer: Medicare Other | Admitting: Physician Assistant

## 2021-01-22 ENCOUNTER — Other Ambulatory Visit: Payer: Self-pay

## 2021-01-22 ENCOUNTER — Ambulatory Visit: Payer: Medicare Other | Admitting: Physician Assistant

## 2021-01-22 ENCOUNTER — Encounter: Payer: Self-pay | Admitting: Physician Assistant

## 2021-01-22 VITALS — BP 120/72 | HR 40 | Ht 62.0 in | Wt 124.0 lb

## 2021-01-22 DIAGNOSIS — B373 Candidiasis of vulva and vagina: Secondary | ICD-10-CM | POA: Diagnosis not present

## 2021-01-22 DIAGNOSIS — B3731 Acute candidiasis of vulva and vagina: Secondary | ICD-10-CM

## 2021-01-22 DIAGNOSIS — R300 Dysuria: Secondary | ICD-10-CM

## 2021-01-22 MED ORDER — FLUCONAZOLE 150 MG PO TABS
150.0000 mg | ORAL_TABLET | Freq: Once | ORAL | 0 refills | Status: AC
Start: 2021-01-22 — End: 2021-01-22

## 2021-01-22 NOTE — [facility] Notes (Signed)
01/22/2021 12:02 PM   [name] [name] May 10, 1925 [id number]  CC: Chief Complaint  Patient presents with   Follow-up    Burning pain with urination   HPI: [name] is a 87 y.o. female with PMH right renal cyst, urinary incontinence, and chronic dysuria with suspected atrophic vaginitis previously prescribed vaginal estrogen cream who presents today for evaluation of dysuria.  She is accompanied today by her caregiver, who contributes to HPI.  Today she reports an approximate 3-week history of ongoing dysuria and worsened urinary incontinence.  She has seen urgent care several times and she has completed 3 courses of antibiotics including Macrobid which was transitioned to Keflex due to side effects, followed by amoxicillin, followed by Cipro.  She completed Cipro 3 days ago.  She denies fever, chills, nausea, or vomiting.  Unfortunately, there is no urgent care or lab testing available for review.  They state that her urine was sent for culture and this directed her antibiotic selection.  She is no longer using topical vaginal estrogen cream.  She was catheterized today for a urine sample, but I was only able to obtain enough urine for culture.  PMH: Past Medical History:  Diagnosis Date   Adenomatous colon polyp 04/26/95   tubulovillous   Allergy    Arthritis    Bradycardia    Bursitis    Cataracts, bilateral    Constipation    COPD, moderate ([place])    "THIS WAS TOLD TO ME BACK IN THE DAYS WHEN EVERYONE THAT WAS  A SMOKER WAS TOLD THEY WERE A COPD. IM 90 AND I DONT GET SHORT OF BREATH , I DONT HAVE IT "   Cystitis    Degenerative joint disease    Depression    DENIES    Gastritis    GERD (gastroesophageal reflux disease)    Glaucoma    Hypoglycemia    Impaired memory    Insomnia    Lack of bladder control    Macular degeneration    OA (osteoarthritis)    Osteopenia    Renal cyst    right   Shortness of breath    Small bowel obstruction (H[facility])     Trigeminal neuralgia    DENIES    Urinary incontinence    Wears dentures     Surgical History: Past Surgical History:  Procedure Laterality Date   ABDOMINAL HYSTERECTOMY  1992   nonmalignant reasons   BACK SURGERY  05/20/08   LUMBAR SPINAL FUSION    BREAST SURGERY     EXCISION OF CALCIUM DEPOSIT    CATARACT EXTRACTION Bilateral    CHOLECYSTECTOMY  1994   DILATION AND CURETTAGE OF UTERUS     EYE SURGERY     RIGHT EYE HEMORRHAGE SURGERY    JOINT REPLACEMENT  2005   LEFT HIP    PARTIAL COLECTOMY  04/26/95   tubulovillous adenoma   SHOULDER SURGERY  01/22/2009   left   TONSILLECTOMY     TOTAL HIP ARTHROPLASTY Right 10/04/2018   Procedure: TOTAL HIP ARTHROPLASTY ANTERIOR APPROACH;  Surgeon: Rod Can, MD;  Location: [facility];  Service: Orthopedics;  Laterality: Right;   TOTAL SHOULDER ARTHROPLASTY Right 07/05/2012   Procedure: RIGHT TOTAL SHOULDER ARTHROPLASTY;  Surgeon: Marin Shutter, MD;  Location: [place];  Service: Orthopedics;  Laterality: Right;  Right total shoulder arthroplasty    Home Medications:  Allergies as of 01/22/2021       Reactions   Codeine Itching, Nausea Only  Small amounts okay   Pregabalin Other (See Comments)   Confusion and hallucinations   Promethazine Hcl Other (See Comments)   Muscle cramps   Sulfonamide Derivatives Nausea Only        Medication List        Accurate as of January 22, 2021 12:02 PM. If you have any questions, ask your nurse or doctor.          STOP taking these medications    acetaminophen 325 MG tablet Commonly known as: TYLENOL Stopped by: Debroah Loop, PA-C   aspirin 81 MG chewable tablet Stopped by: Debroah Loop, PA-C   bisacodyl 5 MG EC tablet Commonly known as: DULCOLAX Stopped by: Debroah Loop, PA-C   dorzolamide-timolol 22.3-6.8 MG/ML ophthalmic solution Commonly known as: COSOPT Stopped by: Debroah Loop, PA-C   fluconazole 150 MG tablet Commonly known as:  DIFLUCAN Stopped by: Debroah Loop, PA-C   ketorolac 0.5 % ophthalmic solution Commonly known as: ACULAR Stopped by: Debroah Loop, PA-C   latanoprost 0.005 % ophthalmic solution Commonly known as: XALATAN Stopped by: Debroah Loop, PA-C   polyethylene glycol 17 g packet Commonly known as: MIRALAX / GLYCOLAX Stopped by: Debroah Loop, PA-C   saccharomyces boulardii 250 MG capsule Commonly known as: Publishing copy Stopped by: Debroah Loop, PA-C   senna 8.6 MG Tabs tablet Commonly known as: SENOKOT Stopped by: Debroah Loop, PA-C   Travoprost (BAK Free) 0.004 % Soln ophthalmic solution Commonly known as: TRAVATAN Stopped by: Debroah Loop, PA-C       TAKE these medications    beta carotene w/minerals tablet Take 1 tablet by mouth 2 (two) times daily.   docusate sodium 100 MG capsule Commonly known as: COLACE Take 1 capsule (100 mg total) by mouth 2 (two) times daily.   famotidine 20 MG tablet Commonly known as: PEPCID Take 20 mg by mouth at bedtime.   morphine 15 MG tablet Commonly known as: MSIR Take 15 mg by mouth 5 (five) times daily as needed.   omeprazole 20 MG capsule Commonly known as: PRILOSEC Take 20 mg by mouth daily before breakfast.   Premarin vaginal cream Generic drug: conjugated estrogens Place 1 Applicatorful vaginally daily. Use pea sized amount M-W-Fr before bedtime   timolol 0.5 % ophthalmic solution Commonly known as: TIMOPTIC timolol maleate 0.5 % eye drops        Allergies:  Allergies  Allergen Reactions   Codeine Itching and Nausea Only    Small amounts okay   Pregabalin Other (See Comments)    Confusion and hallucinations   Promethazine Hcl Other (See Comments)    Muscle cramps   Sulfonamide Derivatives Nausea Only    Family History: Family History  Problem Relation Age of Onset   Cancer Father        colon   Heart disease Sister    Colon polyps Brother     Diabetes Brother     Social History:   reports that she quit smoking about 15 years ago. Her smoking use included cigarettes. She has a 50.00 pack-year smoking history. She has never used smokeless tobacco. She reports that she does not drink alcohol and does not use drugs.  Physical Exam: BP 120/72   Pulse (!) 40   Ht 5\' 2"  (1.575 m)   Wt 124 lb (56.2 kg)   BMI 22.68 kg/m   Constitutional:  Alert and oriented, no acute distress, nontoxic appearing HEENT: [place], AT Cardiovascular: No clubbing, cyanosis, or edema Respiratory: Normal respiratory effort, no increased work  of breathing GU: Sparse pubic hair, thin labia.  Labia minora and vaginal introitus are erythematous and indurated.  There is thick, chunky white discharge throughout.  Inguinal folds are spared. Skin: No rashes, bruises or suspicious lesions Neurologic: Grossly intact, no focal deficits, moving all 4 extremities Psychiatric: Normal mood and affect  Assessment & Plan:   1. Dysuria Unlikely due to persistent versus recurrent UTI given numerous recent courses of antibiotics, however unfortunately unable to determine this for certain in the absence of available lab testing for review.  I explained that I would like to await further antibiotics until we have culture results to guide treatment given multiple recent courses and concern for risk of C diff and candidiasis (see below). We will send her catheterized urine sample for culture and defer treatment pending results.  I think most likely her symptoms are due to worsening OAB versus postinfectious bladder inflammation and atrophic vaginitis. She will ultimately benefit from resuming vaginal estrogen, but will defer this today given active yeast infection as below. - CULTURE, URINE COMPREHENSIVE  2. Vulvovaginal candidiasis Noted on physical exam today.  We will treat with Diflucan. Counseled her to limit herself to pH balanced, unscented soaps for perineal hygeine and use  Vagisil for symptomatic relief. - fluconazole (DIFLUCAN) 150 MG tablet; Take 1 tablet (150 mg total) by mouth once for 1 dose.  Dispense: 1 tablet; Refill: 0   Return for Will call with results.  Debroah Loop, PA-C  [facility] [address], [place], [place] [address] [phone]

## 2021-01-28 ENCOUNTER — Telehealth: Payer: Self-pay | Admitting: Family Medicine

## 2021-01-28 NOTE — Telephone Encounter (Signed)
Let patient know culture results not yet available. Advised patient to call the office if she experiences any of the symptoms below. Patient states no symptoms except the burning. Advised patient she may take Azo in the interim to help with the burning. She expressed understanding.

## 2021-01-28 NOTE — Telephone Encounter (Signed)
Her urine culture has not yet finalized, we will call her when results are available. If she develops fever, chills, nausea, or vomiting, she should let us know.

## 2021-01-28 NOTE — Telephone Encounter (Signed)
Patient called asking for urine culture results 

## 2021-01-30 LAB — CULTURE, URINE COMPREHENSIVE

## 2021-02-02 ENCOUNTER — Telehealth: Payer: Self-pay

## 2021-02-02 ENCOUNTER — Telehealth: Payer: Self-pay | Admitting: *Deleted

## 2021-02-02 DIAGNOSIS — R300 Dysuria: Secondary | ICD-10-CM

## 2021-02-02 MED ORDER — CEFUROXIME AXETIL 250 MG PO TABS: 250.0000 mg | ORAL_TABLET | Freq: Two times a day (BID) | ORAL | 0 refills | Status: AC

## 2021-02-02 NOTE — Telephone Encounter (Signed)
Patient want to know about her c/x results

## 2021-02-02 NOTE — Telephone Encounter (Signed)
Notified patient as advised, ABX sent, patient verbalized understanding.   Also attempted to reach [facility] listed in patients HIPAA restrictions to notify them of results and medication however there was no answer and no VM set up.

## 2021-02-11 ENCOUNTER — Emergency Department: Payer: Medicare Other

## 2021-02-11 ENCOUNTER — Encounter: Payer: Self-pay | Admitting: Emergency Medicine

## 2021-02-11 ENCOUNTER — Other Ambulatory Visit: Payer: Self-pay

## 2021-02-11 ENCOUNTER — Emergency Department
Admission: EM | Admit: 2021-02-11 | Discharge: 2021-02-11 | Disposition: A | Discharge status: Home / Self Care | Payer: Medicare Other | Source: Home / Self Care | Attending: Emergency Medicine | Admitting: Emergency Medicine

## 2021-02-11 DIAGNOSIS — Z20822 Contact with and (suspected) exposure to covid-19: Secondary | ICD-10-CM | POA: Diagnosis not present

## 2021-02-11 DIAGNOSIS — M545 Low back pain, unspecified: Secondary | ICD-10-CM | POA: Insufficient documentation

## 2021-02-11 DIAGNOSIS — J449 Chronic obstructive pulmonary disease, unspecified: Secondary | ICD-10-CM | POA: Insufficient documentation

## 2021-02-11 DIAGNOSIS — G9341 Metabolic encephalopathy: Secondary | ICD-10-CM | POA: Diagnosis not present

## 2021-02-11 DIAGNOSIS — M7071 Other bursitis of hip, right hip: Secondary | ICD-10-CM | POA: Diagnosis not present

## 2021-02-11 DIAGNOSIS — M25551 Pain in right hip: Secondary | ICD-10-CM | POA: Insufficient documentation

## 2021-02-11 DIAGNOSIS — I70221 Atherosclerosis of native arteries of extremities with rest pain, right leg: Secondary | ICD-10-CM | POA: Diagnosis not present

## 2021-02-11 DIAGNOSIS — Y9389 Activity, other specified: Secondary | ICD-10-CM | POA: Insufficient documentation

## 2021-02-11 DIAGNOSIS — R52 Pain, unspecified: Secondary | ICD-10-CM | POA: Diagnosis not present

## 2021-02-11 DIAGNOSIS — G8929 Other chronic pain: Secondary | ICD-10-CM | POA: Insufficient documentation

## 2021-02-11 DIAGNOSIS — Z515 Encounter for palliative care: Secondary | ICD-10-CM | POA: Diagnosis not present

## 2021-02-11 DIAGNOSIS — N184 Chronic kidney disease, stage 4 (severe): Secondary | ICD-10-CM | POA: Insufficient documentation

## 2021-02-11 DIAGNOSIS — Z87891 Personal history of nicotine dependence: Secondary | ICD-10-CM | POA: Insufficient documentation

## 2021-02-11 DIAGNOSIS — Z96641 Presence of right artificial hip joint: Secondary | ICD-10-CM | POA: Insufficient documentation

## 2021-02-11 DIAGNOSIS — R9431 Abnormal electrocardiogram [ECG] [EKG]: Secondary | ICD-10-CM | POA: Diagnosis not present

## 2021-02-11 DIAGNOSIS — I7092 Chronic total occlusion of artery of the extremities: Secondary | ICD-10-CM | POA: Diagnosis not present

## 2021-02-11 DIAGNOSIS — Z66 Do not resuscitate: Secondary | ICD-10-CM | POA: Diagnosis not present

## 2021-02-11 DIAGNOSIS — M719 Bursopathy, unspecified: Secondary | ICD-10-CM

## 2021-02-11 DIAGNOSIS — Z96611 Presence of right artificial shoulder joint: Secondary | ICD-10-CM | POA: Insufficient documentation

## 2021-02-11 DIAGNOSIS — Z471 Aftercare following joint replacement surgery: Secondary | ICD-10-CM | POA: Diagnosis not present

## 2021-02-11 DIAGNOSIS — N170 Acute kidney failure with tubular necrosis: Secondary | ICD-10-CM | POA: Diagnosis not present

## 2021-02-11 MED ORDER — LIDOCAINE 5 % EX PTCH: 1.0000 | MEDICATED_PATCH | Freq: Two times a day (BID) | CUTANEOUS | 0 refills | Status: DC

## 2021-02-11 MED ORDER — LIDOCAINE 5 % EX PTCH
1.0000 | MEDICATED_PATCH | CUTANEOUS | Status: DC
  Administered 2021-02-11: 1 via TRANSDERMAL
  Filled 2021-02-11: qty 1

## 2021-02-11 MED ORDER — MORPHINE SULFATE 15 MG PO TABS
15.0000 mg | ORAL_TABLET | Freq: Once | ORAL | Status: AC
  Administered 2021-02-11: 15 mg via ORAL
  Filled 2021-02-11: qty 1

## 2021-02-11 MED ORDER — MORPHINE SULFATE (PF) 2 MG/ML IV SOLN
2.0000 mg | Freq: Once | INTRAVENOUS | Status: AC
  Administered 2021-02-11: 2 mg via INTRAVENOUS
  Filled 2021-02-11: qty 1

## 2021-02-11 NOTE — ED Provider Notes (Signed)
[facility] Emergency Department Provider Note   ____________________________________________   I have reviewed the triage vital signs and the nursing notes.   HISTORY  Chief Complaint Hip Pain   History limited by: Not Limited   HPI [name] is a 87 y.o. female who presents to the emergency department today because of concerns for right hip pain.  Patient states that she took a nap on a couch today.  When she woke up around 3:00 this afternoon she started having severe pain in her right hip.  She points to the lateral aspect of the upper thigh. She states she does have history of replacement of that right hip joint.  She states she has some chronic back pain issues and takes morphine for it so tried some morphine around 3:00 when she woke up without any significant relief.  Patient denies any trauma or recent abnormal activity. Recently finished antibiotics for UTI and says her urine has improved.    Records reviewed. Per medical record review patient has a history of COPD, depression.  Past Medical History:  Diagnosis Date   Adenomatous colon polyp 04/26/95   tubulovillous   Allergy    Arthritis    Bradycardia    Bursitis    Cataracts, bilateral    Constipation    COPD, moderate ([place])    "THIS WAS TOLD TO ME BACK IN THE DAYS WHEN EVERYONE THAT WAS  A SMOKER WAS TOLD THEY WERE A COPD. IM 89 AND I DONT GET SHORT OF BREATH , I DONT HAVE IT "   Cystitis    Degenerative joint disease    Depression    DENIES    Gastritis    GERD (gastroesophageal reflux disease)    Glaucoma    Hypoglycemia    Impaired memory    Insomnia    Lack of bladder control    Macular degeneration    OA (osteoarthritis)    Osteopenia    Renal cyst    right   Shortness of breath    Small bowel obstruction (HCC)    Trigeminal neuralgia    DENIES    Urinary incontinence    Wears dentures     Patient Active Problem List   Diagnosis Date Noted   Exudative  age-related macular degeneration of left eye ([place]) 01/13/2020   Advanced nonexudative age-related macular degeneration of both eyes with subfoveal involvement 01/13/2020   Choroidal neovascularization of right eye 01/13/2020   Primary open angle glaucoma of both eyes, severe stage 01/13/2020   Chronic renal disease, stage 4, severely decreased glomerular filtration rate (GFR) between 15-29 mL/min/1.73 square meter ([place]) 03/04/2019   Generalized abdominal tenderness without rebound tenderness 03/04/2019   UTI (urinary tract infection) 11/01/2018   Osteoarthritis of right hip 10/04/2018   TSH elevation 09/24/2018   Frequent UTI 11/09/2017   Leukocytosis 02/06/2016   Dysuria 06/05/2015   Hyperlipidemia with target LDL less than 160 09/24/2014   Routine general medical examination at a health care facility 09/24/2014   MACULAR DEGENERATION 08/07/2008   Glaucoma 08/07/2008   Insomnia 08/07/2008   Constipation 07/22/2008   NEURALGIA, TRIGEMINAL 01/07/2008   Allergic rhinitis 09/03/2007   COPD 09/03/2007   GERD 09/03/2007   Osteoarthritis 09/03/2007    Past Surgical History:  Procedure Laterality Date   ABDOMINAL HYSTERECTOMY  1992   nonmalignant reasons   BACK SURGERY  05/20/08   LUMBAR SPINAL FUSION    BREAST SURGERY     EXCISION OF CALCIUM DEPOSIT  CATARACT EXTRACTION Bilateral    CHOLECYSTECTOMY  1994   DILATION AND CURETTAGE OF UTERUS     EYE SURGERY     RIGHT EYE HEMORRHAGE SURGERY    JOINT REPLACEMENT  2005   LEFT HIP    PARTIAL COLECTOMY  04/26/95   tubulovillous adenoma   SHOULDER SURGERY  01/22/2009   left   TONSILLECTOMY     TOTAL HIP ARTHROPLASTY Right 10/04/2018   Procedure: TOTAL HIP ARTHROPLASTY ANTERIOR APPROACH;  Surgeon: Rod Can, MD;  Location: [facility];  Service: Orthopedics;  Laterality: Right;   TOTAL SHOULDER ARTHROPLASTY Right 07/05/2012   Procedure: RIGHT TOTAL SHOULDER ARTHROPLASTY;  Surgeon: Marin Shutter, MD;  Location: [place];  Service:  Orthopedics;  Laterality: Right;  Right total shoulder arthroplasty    Prior to Admission medications   Medication Sig Start Date End Date Taking? Authorizing Provider  beta carotene w/minerals (OCUVITE) tablet Take 1 tablet by mouth 2 (two) times daily.    [provider]  conjugated estrogens (PREMARIN) vaginal cream Place 1 Applicatorful vaginally daily. Use pea sized amount M-W-Fr before bedtime 11/21/19   Hollice Espy, MD  docusate sodium (COLACE) 100 MG capsule Take 1 capsule (100 mg total) by mouth 2 (two) times daily. 10/05/18   Swinteck, Aaron Edelman, MD  famotidine (PEPCID) 20 MG tablet Take 20 mg by mouth at bedtime. 01/05/21   [provider]  morphine (MSIR) 15 MG tablet Take 15 mg by mouth 5 (five) times daily as needed. 01/18/21   [provider]  omeprazole (PRILOSEC) 20 MG capsule Take 20 mg by mouth daily before breakfast.     [provider]  timolol (TIMOPTIC) 0.5 % ophthalmic solution timolol maleate 0.5 % eye drops    [provider]    Allergies Codeine, Pregabalin, Promethazine hcl, and Sulfonamide derivatives  Family History  Problem Relation Age of Onset   Cancer Father        colon   Heart disease Sister    Colon polyps Brother    Diabetes Brother     Social History Social History   Tobacco Use   Smoking status: Former    Packs/day: 1.00    Years: 50.00    Pack years: 50.00    Types: Cigarettes    Quit date: 06/28/2005    Years since quitting: 15.6   Smokeless tobacco: Never  Substance Use Topics   Alcohol use: No   Drug use: No    Review of Systems Constitutional: No fever/chills Eyes: No visual changes. ENT: No sore throat. Cardiovascular: Denies chest pain. Respiratory: Denies shortness of breath. Gastrointestinal: No abdominal pain.  No nausea, no vomiting.  No diarrhea.   Genitourinary: Negative for dysuria. Musculoskeletal: Positive for right hip and back pain. Skin: Negative for  rash. Neurological: Negative for headaches, focal weakness or numbness.  ____________________________________________   PHYSICAL EXAM:  VITAL SIGNS: ED Triage Vitals  Enc Vitals Group     BP 02/11/21 1613 (!) 199/73     Pulse Rate 02/11/21 1613 (!) 54     Resp 02/11/21 1613 14     Temp --      Temp src --      SpO2 02/11/21 1613 100 %     Weight 02/11/21 1614 124 lb (56.2 kg)     Height 02/11/21 1614 5\' 2"  (1.575 m)     Head Circumference --      Peak Flow --      Pain Score 02/11/21 1614 10  Constitutional: Alert and oriented.  Eyes: Conjunctivae are normal.  ENT      Head: Normocephalic and atraumatic.      Nose: No congestion/rhinnorhea.      Mouth/Throat: Mucous membranes are moist.      Neck: No stridor. Hematological/Lymphatic/Immunilogical: No cervical lymphadenopathy. Cardiovascular: Normal rate, regular rhythm.  No murmurs, rubs, or gallops.  Respiratory: Normal respiratory effort without tachypnea nor retractions. Breath sounds are clear and equal bilaterally. No wheezes/rales/rhonchi. Gastrointestinal: Soft and non tender. No rebound. No guarding.  Genitourinary: Deferred Musculoskeletal: Normal range of motion in all extremities. No lower extremity edema. Neurologic:  Normal speech and language. No gross focal neurologic deficits are appreciated.  Skin:  Skin is warm, dry and intact. No rash noted. Psychiatric: Mood and affect are normal. Speech and behavior are normal. Patient exhibits appropriate insight and judgment.  ____________________________________________    LABS (pertinent positives/negatives)  None  ____________________________________________   EKG  None  ____________________________________________    RADIOLOGY  Right hip No acute fracture. No dislocation.  ____________________________________________   PROCEDURES  Procedures  ____________________________________________   INITIAL IMPRESSION / ASSESSMENT AND PLAN / ED  COURSE  Pertinent labs & imaging results that were available during my care of the patient were reviewed by me and considered in my medical decision making (see chart for details).   Patient presents to the emergency department today because of concern for right hip pain. Patient denies any trauma. On exam patient points to the lateral aspect of the upper thigh. Did obtain x-ray which did not show any acute abnormalities. Patient is able to voluntarily move the leg and sit up on the side of the bed. At this time do wonder if patient is suffering from bursitis. Stated she did get some relief from the lidoderm patch. Did discuss importance of follow up with orthopedics.  ____________________________________________   FINAL CLINICAL IMPRESSION(S) / ED DIAGNOSES  Final diagnoses:  Right hip pain  Bursitis, unspecified site     Note: This dictation was prepared with Dragon dictation. Any transcriptional errors that result from this process are unintentional     Nance Pear, MD 02/11/21 1948

## 2021-02-11 NOTE — ED Notes (Signed)
Dr. Archie Balboa, EDP at bedside.

## 2021-02-11 NOTE — ED Notes (Signed)
Pt's sister notified pt is being discharged, per pt her caregiver is tammy who would be picking her up but there is no contact information for her.  Sister does not have any information either. Tried to call [facility] for information, but staff were unable to locate any information for Tammy.

## 2021-02-11 NOTE — ED Notes (Signed)
Pt discharged back to [facility] with security officer Marya Amsler, DNR paperwork and Vial of Life form given back to patient at discharge.

## 2021-02-11 NOTE — ED Notes (Signed)
Patient transported to X-ray 

## 2021-02-11 NOTE — ED Notes (Signed)
This RN spoke to security at [facility] stating patient was ready to be discharged.  Security states will send transport in 20-30 minutes for patient.

## 2021-02-11 NOTE — ED Notes (Addendum)
Pt yelling "help! I need help" this RN at bedside. Pt states that she is in pain, pt I rolling onto affected side and back and forth and also sitting herself up in the bed at this time. Pt continues to yell and won't let this RN speak to explain that moving the affected side will not ease her pain. Informed that her call bell was on the bed rails and made sure to reassured that multiple times. Notified Olivia, RN is requesting pain medication at this time. Pt is A&OX4 and able to follow commands.

## 2021-02-11 NOTE — ED Notes (Signed)
Pt found to be trying to get up at the end of the bed trying to get down. This RN attempted to help patient back in bed. Pt then trying to fall to floor with no self support. Pt eased to floor slowly and this RN called ashley RN to help assist pt back in bed. Pt states she " was waiting on the nurse".   Upon assessment pt has R lower skin tear. Caryl Pina RN dressed skin tear.  Pt is in NAD at this time.  Annie Main Charge RN made aware.

## 2021-02-11 NOTE — ED Notes (Addendum)
This RN at bedside. Pt states that she is in so much pain and that the medication helped briefly and now isn't helping anymore. Pt states that he butt is now numb. Offered to reposition pt but pt refused. Pt requesting more pain medication. EDP Archie Balboa made aware.

## 2021-02-11 NOTE — ED Triage Notes (Signed)
Pt to ED via [facility] from [facility] c/o Right Hip pain. Pt states she fell asleep on the couch and woke up around 3pm with R hip pain.  Pt denies any recent falls. Pt states she takes morphine 4-5x/day for chronic back pain. Pt has a hx of both hips replaced  Pt A&Ox4, NAD

## 2021-02-11 NOTE — Discharge Instructions (Addendum)
Please seek medical attention for any high fevers, chest pain, shortness of breath, change in behavior, persistent vomiting, bloody stool or any other new or concerning symptoms.  

## 2021-02-12 ENCOUNTER — Inpatient Hospital Stay
Admission: EM | Admit: 2021-02-12 | Discharge: 2021-02-19 | DRG: 270 | Disposition: A | Discharge status: Hospice | Payer: Medicare Other | Source: Skilled Nursing Facility | Attending: Hospitalist | Admitting: Hospitalist

## 2021-02-12 ENCOUNTER — Encounter: Payer: Self-pay | Admitting: Emergency Medicine

## 2021-02-12 ENCOUNTER — Encounter
Admission: EM | Disposition: A | Discharge status: Hospice | Payer: Self-pay | Source: Home / Self Care | Attending: Hospitalist

## 2021-02-12 ENCOUNTER — Inpatient Hospital Stay: Payer: Medicare Other

## 2021-02-12 DIAGNOSIS — I82401 Acute embolism and thrombosis of unspecified deep veins of right lower extremity: Secondary | ICD-10-CM | POA: Diagnosis not present

## 2021-02-12 DIAGNOSIS — R32 Unspecified urinary incontinence: Secondary | ICD-10-CM | POA: Diagnosis not present

## 2021-02-12 DIAGNOSIS — I70221 Atherosclerosis of native arteries of extremities with rest pain, right leg: Secondary | ICD-10-CM | POA: Diagnosis present

## 2021-02-12 DIAGNOSIS — I4891 Unspecified atrial fibrillation: Secondary | ICD-10-CM | POA: Diagnosis not present

## 2021-02-12 DIAGNOSIS — Z9049 Acquired absence of other specified parts of digestive tract: Secondary | ICD-10-CM | POA: Diagnosis not present

## 2021-02-12 DIAGNOSIS — K219 Gastro-esophageal reflux disease without esophagitis: Secondary | ICD-10-CM | POA: Diagnosis present

## 2021-02-12 DIAGNOSIS — I129 Hypertensive chronic kidney disease with stage 1 through stage 4 chronic kidney disease, or unspecified chronic kidney disease: Secondary | ICD-10-CM | POA: Diagnosis present

## 2021-02-12 DIAGNOSIS — G8929 Other chronic pain: Secondary | ICD-10-CM | POA: Diagnosis present

## 2021-02-12 DIAGNOSIS — T79A21A Traumatic compartment syndrome of right lower extremity, initial encounter: Secondary | ICD-10-CM | POA: Diagnosis present

## 2021-02-12 DIAGNOSIS — Z79899 Other long term (current) drug therapy: Secondary | ICD-10-CM

## 2021-02-12 DIAGNOSIS — N170 Acute kidney failure with tubular necrosis: Secondary | ICD-10-CM | POA: Diagnosis not present

## 2021-02-12 DIAGNOSIS — I7090 Unspecified atherosclerosis: Secondary | ICD-10-CM | POA: Diagnosis not present

## 2021-02-12 DIAGNOSIS — Z885 Allergy status to narcotic agent status: Secondary | ICD-10-CM

## 2021-02-12 DIAGNOSIS — Z66 Do not resuscitate: Secondary | ICD-10-CM | POA: Diagnosis present

## 2021-02-12 DIAGNOSIS — Z888 Allergy status to other drugs, medicaments and biological substances status: Secondary | ICD-10-CM

## 2021-02-12 DIAGNOSIS — J449 Chronic obstructive pulmonary disease, unspecified: Secondary | ICD-10-CM | POA: Diagnosis present

## 2021-02-12 DIAGNOSIS — R821 Myoglobinuria: Secondary | ICD-10-CM | POA: Diagnosis present

## 2021-02-12 DIAGNOSIS — D72829 Elevated white blood cell count, unspecified: Secondary | ICD-10-CM | POA: Diagnosis not present

## 2021-02-12 DIAGNOSIS — I10 Essential (primary) hypertension: Secondary | ICD-10-CM | POA: Diagnosis not present

## 2021-02-12 DIAGNOSIS — I745 Embolism and thrombosis of iliac artery: Secondary | ICD-10-CM | POA: Diagnosis present

## 2021-02-12 DIAGNOSIS — Z86010 Personal history of colonic polyps: Secondary | ICD-10-CM

## 2021-02-12 DIAGNOSIS — N1831 Chronic kidney disease, stage 3a: Secondary | ICD-10-CM | POA: Diagnosis present

## 2021-02-12 DIAGNOSIS — R627 Adult failure to thrive: Secondary | ICD-10-CM | POA: Diagnosis not present

## 2021-02-12 DIAGNOSIS — I70222 Atherosclerosis of native arteries of extremities with rest pain, left leg: Secondary | ICD-10-CM | POA: Diagnosis present

## 2021-02-12 DIAGNOSIS — R001 Bradycardia, unspecified: Secondary | ICD-10-CM | POA: Diagnosis present

## 2021-02-12 DIAGNOSIS — I479 Paroxysmal tachycardia, unspecified: Secondary | ICD-10-CM | POA: Diagnosis present

## 2021-02-12 DIAGNOSIS — Z5309 Procedure and treatment not carried out because of other contraindication: Secondary | ICD-10-CM | POA: Diagnosis not present

## 2021-02-12 DIAGNOSIS — F0394 Unspecified dementia, unspecified severity, with anxiety: Secondary | ICD-10-CM | POA: Diagnosis present

## 2021-02-12 DIAGNOSIS — I998 Other disorder of circulatory system: Secondary | ICD-10-CM | POA: Diagnosis not present

## 2021-02-12 DIAGNOSIS — R413 Other amnesia: Secondary | ICD-10-CM | POA: Diagnosis present

## 2021-02-12 DIAGNOSIS — E785 Hyperlipidemia, unspecified: Secondary | ICD-10-CM | POA: Diagnosis present

## 2021-02-12 DIAGNOSIS — Z981 Arthrodesis status: Secondary | ICD-10-CM

## 2021-02-12 DIAGNOSIS — M545 Low back pain, unspecified: Secondary | ICD-10-CM | POA: Diagnosis present

## 2021-02-12 DIAGNOSIS — Z515 Encounter for palliative care: Secondary | ICD-10-CM

## 2021-02-12 DIAGNOSIS — D631 Anemia in chronic kidney disease: Secondary | ICD-10-CM | POA: Diagnosis not present

## 2021-02-12 DIAGNOSIS — Z20822 Contact with and (suspected) exposure to covid-19: Secondary | ICD-10-CM | POA: Diagnosis present

## 2021-02-12 DIAGNOSIS — I2510 Atherosclerotic heart disease of native coronary artery without angina pectoris: Secondary | ICD-10-CM | POA: Diagnosis present

## 2021-02-12 DIAGNOSIS — Z7401 Bed confinement status: Secondary | ICD-10-CM | POA: Diagnosis not present

## 2021-02-12 DIAGNOSIS — Z882 Allergy status to sulfonamides status: Secondary | ICD-10-CM

## 2021-02-12 DIAGNOSIS — N390 Urinary tract infection, site not specified: Secondary | ICD-10-CM | POA: Diagnosis not present

## 2021-02-12 DIAGNOSIS — G9341 Metabolic encephalopathy: Secondary | ICD-10-CM | POA: Diagnosis present

## 2021-02-12 DIAGNOSIS — G4700 Insomnia, unspecified: Secondary | ICD-10-CM | POA: Diagnosis not present

## 2021-02-12 DIAGNOSIS — Z96643 Presence of artificial hip joint, bilateral: Secondary | ICD-10-CM | POA: Diagnosis present

## 2021-02-12 DIAGNOSIS — M1990 Unspecified osteoarthritis, unspecified site: Secondary | ICD-10-CM | POA: Diagnosis present

## 2021-02-12 DIAGNOSIS — R4182 Altered mental status, unspecified: Secondary | ICD-10-CM | POA: Diagnosis not present

## 2021-02-12 DIAGNOSIS — H353220 Exudative age-related macular degeneration, left eye, stage unspecified: Secondary | ICD-10-CM | POA: Diagnosis present

## 2021-02-12 DIAGNOSIS — N183 Chronic kidney disease, stage 3 unspecified: Secondary | ICD-10-CM | POA: Diagnosis present

## 2021-02-12 DIAGNOSIS — N179 Acute kidney failure, unspecified: Secondary | ICD-10-CM | POA: Diagnosis not present

## 2021-02-12 DIAGNOSIS — D126 Benign neoplasm of colon, unspecified: Secondary | ICD-10-CM | POA: Diagnosis not present

## 2021-02-12 DIAGNOSIS — Z87448 Personal history of other diseases of urinary system: Secondary | ICD-10-CM | POA: Diagnosis not present

## 2021-02-12 DIAGNOSIS — M25572 Pain in left ankle and joints of left foot: Secondary | ICD-10-CM | POA: Diagnosis not present

## 2021-02-12 DIAGNOSIS — E43 Unspecified severe protein-calorie malnutrition: Secondary | ICD-10-CM | POA: Diagnosis not present

## 2021-02-12 DIAGNOSIS — Z8371 Family history of colonic polyps: Secondary | ICD-10-CM

## 2021-02-12 DIAGNOSIS — A419 Sepsis, unspecified organism: Secondary | ICD-10-CM | POA: Diagnosis not present

## 2021-02-12 DIAGNOSIS — I743 Embolism and thrombosis of arteries of the lower extremities: Secondary | ICD-10-CM | POA: Diagnosis not present

## 2021-02-12 DIAGNOSIS — Z833 Family history of diabetes mellitus: Secondary | ICD-10-CM

## 2021-02-12 DIAGNOSIS — I4819 Other persistent atrial fibrillation: Secondary | ICD-10-CM | POA: Diagnosis not present

## 2021-02-12 DIAGNOSIS — M79A21 Nontraumatic compartment syndrome of right lower extremity: Secondary | ICD-10-CM | POA: Diagnosis not present

## 2021-02-12 DIAGNOSIS — Z87891 Personal history of nicotine dependence: Secondary | ICD-10-CM

## 2021-02-12 DIAGNOSIS — M79673 Pain in unspecified foot: Secondary | ICD-10-CM | POA: Diagnosis not present

## 2021-02-12 DIAGNOSIS — M25551 Pain in right hip: Secondary | ICD-10-CM | POA: Diagnosis not present

## 2021-02-12 DIAGNOSIS — R7989 Other specified abnormal findings of blood chemistry: Secondary | ICD-10-CM | POA: Diagnosis present

## 2021-02-12 DIAGNOSIS — K56609 Unspecified intestinal obstruction, unspecified as to partial versus complete obstruction: Secondary | ICD-10-CM | POA: Diagnosis not present

## 2021-02-12 DIAGNOSIS — N281 Cyst of kidney, acquired: Secondary | ICD-10-CM | POA: Diagnosis not present

## 2021-02-12 DIAGNOSIS — F32A Depression, unspecified: Secondary | ICD-10-CM | POA: Diagnosis present

## 2021-02-12 DIAGNOSIS — R52 Pain, unspecified: Secondary | ICD-10-CM | POA: Diagnosis not present

## 2021-02-12 DIAGNOSIS — E8809 Other disorders of plasma-protein metabolism, not elsewhere classified: Secondary | ICD-10-CM | POA: Diagnosis not present

## 2021-02-12 DIAGNOSIS — L8990 Pressure ulcer of unspecified site, unspecified stage: Secondary | ICD-10-CM | POA: Insufficient documentation

## 2021-02-12 DIAGNOSIS — R404 Transient alteration of awareness: Secondary | ICD-10-CM | POA: Diagnosis not present

## 2021-02-12 DIAGNOSIS — Z96611 Presence of right artificial shoulder joint: Secondary | ICD-10-CM | POA: Diagnosis present

## 2021-02-12 DIAGNOSIS — I7092 Chronic total occlusion of artery of the extremities: Secondary | ICD-10-CM | POA: Diagnosis not present

## 2021-02-12 DIAGNOSIS — N184 Chronic kidney disease, stage 4 (severe): Secondary | ICD-10-CM | POA: Diagnosis not present

## 2021-02-12 DIAGNOSIS — Z800 Family history of malignant neoplasm of digestive organs: Secondary | ICD-10-CM

## 2021-02-12 DIAGNOSIS — R0989 Other specified symptoms and signs involving the circulatory and respiratory systems: Secondary | ICD-10-CM | POA: Diagnosis not present

## 2021-02-12 DIAGNOSIS — R634 Abnormal weight loss: Secondary | ICD-10-CM | POA: Diagnosis not present

## 2021-02-12 HISTORY — PX: LOWER EXTREMITY ANGIOGRAPHY: CATH118251

## 2021-02-12 HISTORY — PX: LOWER EXTREMITY INTERVENTION: CATH118252

## 2021-02-12 LAB — CBC
HCT: 40 % (ref 36.0–46.0)
HCT: 27.2 % — ABNORMAL LOW (ref 36.0–46.0)
Hemoglobin: 13.8 g/dL (ref 12.0–15.0)
Hemoglobin: 8.8 g/dL — ABNORMAL LOW (ref 12.0–15.0)
MCH: 31.1 pg (ref 26.0–34.0)
MCH: 31 pg (ref 26.0–34.0)
MCHC: 34.5 g/dL (ref 30.0–36.0)
MCHC: 32.4 g/dL (ref 30.0–36.0)
MCV: 90.1 fL (ref 80.0–100.0)
MCV: 95.8 fL (ref 80.0–100.0)
Platelets: 282 10*3/uL (ref 150–400)
Platelets: 172 10*3/uL (ref 150–400)
RBC: 4.44 MIL/uL (ref 3.87–5.11)
RBC: 2.84 MIL/uL — ABNORMAL LOW (ref 3.87–5.11)
RDW: 13.3 % (ref 11.5–15.5)
RDW: 13.6 % (ref 11.5–15.5)
WBC: 15.7 10*3/uL — ABNORMAL HIGH (ref 4.0–10.5)
WBC: 20 10*3/uL — ABNORMAL HIGH (ref 4.0–10.5)
nRBC: 0 % (ref 0.0–0.2)
nRBC: 0 % (ref 0.0–0.2)

## 2021-02-12 LAB — COMPREHENSIVE METABOLIC PANEL
ALT: 23 U/L (ref 0–44)
AST: 96 U/L — ABNORMAL HIGH (ref 15–41)
Albumin: 3.5 g/dL (ref 3.5–5.0)
Alkaline Phosphatase: 53 U/L (ref 38–126)
Anion gap: 15 (ref 5–15)
BUN: 24 mg/dL — ABNORMAL HIGH (ref 8–23)
CO2: 19 mmol/L — ABNORMAL LOW (ref 22–32)
Calcium: 10.2 mg/dL (ref 8.9–10.3)
Chloride: 107 mmol/L (ref 98–111)
Creatinine, Ser: 0.92 mg/dL (ref 0.44–1.00)
GFR, Estimated: 58 mL/min — ABNORMAL LOW (ref >60)
Glucose, Bld: 146 mg/dL — ABNORMAL HIGH (ref 70–99)
Potassium: 4.3 mmol/L (ref 3.5–5.1)
Sodium: 141 mmol/L (ref 135–145)
Total Bilirubin: 1.1 mg/dL (ref 0.3–1.2)
Total Protein: 7.8 g/dL (ref 6.5–8.1)

## 2021-02-12 LAB — BASIC METABOLIC PANEL
Anion gap: 7 (ref 5–15)
BUN: 16 mg/dL (ref 8–23)
CO2: 16 mmol/L — ABNORMAL LOW (ref 22–32)
Calcium: 6.4 mg/dL — CL (ref 8.9–10.3)
Chloride: 118 mmol/L — ABNORMAL HIGH (ref 98–111)
Creatinine, Ser: 0.67 mg/dL (ref 0.44–1.00)
GFR, Estimated: >60 mL/min (ref >60)
Glucose, Bld: 121 mg/dL — ABNORMAL HIGH (ref 70–99)
Potassium: 3.3 mmol/L — ABNORMAL LOW (ref 3.5–5.1)
Sodium: 141 mmol/L (ref 135–145)

## 2021-02-12 LAB — GLUCOSE, CAPILLARY
Glucose-Capillary: 128 mg/dL — ABNORMAL HIGH (ref 70–99)
Glucose-Capillary: 110 mg/dL — ABNORMAL HIGH (ref 70–99)
Glucose-Capillary: 93 mg/dL (ref 70–99)

## 2021-02-12 LAB — PROTIME-INR
INR: 1.1 (ref 0.8–1.2)
Prothrombin Time: 14.6 seconds (ref 11.4–15.2)

## 2021-02-12 LAB — RESP PANEL BY RT-PCR (FLU A&B, COVID) ARPGX2
Influenza A by PCR: NEGATIVE
Influenza B by PCR: NEGATIVE
SARS Coronavirus 2 by RT PCR: NEGATIVE

## 2021-02-12 LAB — APTT: aPTT: 30 seconds (ref 24–36)

## 2021-02-12 LAB — MRSA NEXT GEN BY PCR, NASAL: MRSA by PCR Next Gen: NOT DETECTED

## 2021-02-12 LAB — ALBUMIN: Albumin: 3 g/dL — ABNORMAL LOW (ref 3.5–5.0)

## 2021-02-12 SURGERY — LOWER EXTREMITY INTERVENTION
Anesthesia: Moderate Sedation | Laterality: Right

## 2021-02-12 SURGERY — LOWER EXTREMITY ANGIOGRAPHY
Anesthesia: Moderate Sedation | Laterality: Right

## 2021-02-12 MED ORDER — MORPHINE SULFATE (PF) 2 MG/ML IV SOLN
2.0000 mg | INTRAVENOUS | Status: DC | PRN
  Administered 2021-02-12 – 2021-02-13 (×2): 2 mg via INTRAVENOUS
  Filled 2021-02-12 (×2): qty 1

## 2021-02-12 MED ORDER — MIDAZOLAM HCL 2 MG/2ML IJ SOLN
INTRAMUSCULAR | Status: AC
  Filled 2021-02-12: qty 2

## 2021-02-12 MED ORDER — FENTANYL CITRATE PF 50 MCG/ML IJ SOSY
100.0000 ug | PREFILLED_SYRINGE | Freq: Once | INTRAMUSCULAR | Status: AC
  Administered 2021-02-12: 100 ug via INTRAVENOUS
  Filled 2021-02-12: qty 2

## 2021-02-12 MED ORDER — SODIUM CHLORIDE 0.9% FLUSH: 3.0000 mL | INTRAVENOUS | Status: DC | PRN

## 2021-02-12 MED ORDER — TIROFIBAN HCL IN NACL 5-0.9 MG/100ML-% IV SOLN
0.0750 ug/kg/min | INTRAVENOUS | Status: DC
  Filled 2021-02-12: qty 100

## 2021-02-12 MED ORDER — PANTOPRAZOLE SODIUM 40 MG PO TBEC
40.0000 mg | DELAYED_RELEASE_TABLET | Freq: Every day | ORAL | Status: DC
  Administered 2021-02-12: 40 mg via ORAL
  Filled 2021-02-12: qty 1

## 2021-02-12 MED ORDER — DOCUSATE SODIUM 100 MG PO CAPS: 100.0000 mg | ORAL_CAPSULE | Freq: Two times a day (BID) | ORAL | Status: DC | PRN

## 2021-02-12 MED ORDER — ALTEPLASE 1 MG/ML SYRINGE FOR VASCULAR PROCEDURE
INTRAMUSCULAR | Status: DC | PRN
  Administered 2021-02-12: 6 mg via INTRA_ARTERIAL

## 2021-02-12 MED ORDER — ONDANSETRON HCL 4 MG/2ML IJ SOLN
INTRAMUSCULAR | Status: AC
  Administered 2021-02-12: 4 mg via INTRAVENOUS
  Filled 2021-02-12: qty 2

## 2021-02-12 MED ORDER — ONDANSETRON HCL 4 MG/2ML IJ SOLN
4.0000 mg | Freq: Three times a day (TID) | INTRAMUSCULAR | Status: DC | PRN
Start: 2021-02-12 — End: 2021-02-12

## 2021-02-12 MED ORDER — FENTANYL CITRATE (PF) 100 MCG/2ML IJ SOLN
INTRAMUSCULAR | Status: DC | PRN
  Administered 2021-02-12 (×3): 25 ug via INTRAVENOUS
  Administered 2021-02-12: 50 ug via INTRAVENOUS

## 2021-02-12 MED ORDER — FENTANYL CITRATE PF 50 MCG/ML IJ SOSY
PREFILLED_SYRINGE | INTRAMUSCULAR | Status: AC
  Filled 2021-02-12: qty 1

## 2021-02-12 MED ORDER — MORPHINE SULFATE (PF) 2 MG/ML IV SOLN
INTRAVENOUS | Status: AC
  Administered 2021-02-12: 2 mg via INTRAVENOUS
  Filled 2021-02-12: qty 1

## 2021-02-12 MED ORDER — HEPARIN (PORCINE) 25000 UT/250ML-% IV SOLN
1000.0000 [IU]/h | INTRAVENOUS | Status: DC
  Administered 2021-02-12: 1000 [IU]/h via INTRAVENOUS
  Filled 2021-02-12: qty 250

## 2021-02-12 MED ORDER — SODIUM CHLORIDE 0.9 % IV SOLN: INTRAVENOUS | Status: DC

## 2021-02-12 MED ORDER — TIROFIBAN (AGGRASTAT) BOLUS VIA INFUSION
25.0000 ug/kg | Freq: Once | INTRAVENOUS | Status: AC
  Administered 2021-02-12: 1335 ug via INTRAVENOUS
  Filled 2021-02-12: qty 27

## 2021-02-12 MED ORDER — OXYCODONE HCL 5 MG PO TABS
ORAL_TABLET | ORAL | Status: AC
  Filled 2021-02-12: qty 1

## 2021-02-12 MED ORDER — HYDROMORPHONE HCL 1 MG/ML IJ SOLN
INTRAMUSCULAR | Status: AC
  Administered 2021-02-12: 1 mg via INTRAVENOUS
  Filled 2021-02-12: qty 1

## 2021-02-12 MED ORDER — SODIUM CHLORIDE 0.9% FLUSH
3.0000 mL | Freq: Two times a day (BID) | INTRAVENOUS | Status: DC
  Administered 2021-02-12 – 2021-02-19 (×9): 3 mL via INTRAVENOUS

## 2021-02-12 MED ORDER — ALTEPLASE 2 MG IJ SOLR
INTRAMUSCULAR | Status: AC
  Filled 2021-02-12: qty 2

## 2021-02-12 MED ORDER — HEPARIN (PORCINE) 25000 UT/250ML-% IV SOLN
INTRAVENOUS | Status: AC
  Administered 2021-02-12: 600 [IU]/h via INTRAVENOUS
  Filled 2021-02-12: qty 250

## 2021-02-12 MED ORDER — HYDROMORPHONE HCL 1 MG/ML IJ SOLN
INTRAMUSCULAR | Status: AC
  Filled 2021-02-12: qty 1

## 2021-02-12 MED ORDER — MIDAZOLAM HCL 2 MG/2ML IJ SOLN
INTRAMUSCULAR | Status: DC | PRN
  Administered 2021-02-12 (×2): 1 mg via INTRAVENOUS

## 2021-02-12 MED ORDER — HYDROMORPHONE HCL 1 MG/ML IJ SOLN
1.0000 mg | INTRAMUSCULAR | Status: DC | PRN
  Administered 2021-02-12 – 2021-02-15 (×12): 1 mg via INTRAVENOUS
  Filled 2021-02-12 (×11): qty 1

## 2021-02-12 MED ORDER — SODIUM CHLORIDE 0.9 % IV SOLN
250.0000 mL | INTRAVENOUS | Status: DC | PRN
  Administered 2021-02-14 – 2021-02-16 (×2): 250 mL via INTRAVENOUS

## 2021-02-12 MED ORDER — HEPARIN SODIUM (PORCINE) 1000 UNIT/ML IJ SOLN
INTRAMUSCULAR | Status: DC | PRN
  Administered 2021-02-12: 3000 [IU] via INTRAVENOUS

## 2021-02-12 MED ORDER — IODIXANOL 320 MG/ML IV SOLN
INTRAVENOUS | Status: DC | PRN
  Administered 2021-02-12: 60 mL via INTRA_ARTERIAL

## 2021-02-12 MED ORDER — MORPHINE SULFATE (PF) 4 MG/ML IV SOLN
INTRAVENOUS | Status: AC
  Filled 2021-02-12: qty 1

## 2021-02-12 MED ORDER — HEPARIN SODIUM (PORCINE) 1000 UNIT/ML IJ SOLN
INTRAMUSCULAR | Status: AC
  Filled 2021-02-12: qty 1

## 2021-02-12 MED ORDER — CEFAZOLIN SODIUM-DEXTROSE 2-4 GM/100ML-% IV SOLN
INTRAVENOUS | Status: AC
  Filled 2021-02-12: qty 100

## 2021-02-12 MED ORDER — CHLORHEXIDINE GLUCONATE CLOTH 2 % EX PADS: 6.0000 | MEDICATED_PAD | Freq: Every day | CUTANEOUS | Status: DC

## 2021-02-12 MED ORDER — MORPHINE SULFATE (PF) 2 MG/ML IV SOLN
INTRAVENOUS | Status: AC
  Administered 2021-02-12: 2 mg via INTRAVENOUS
  Filled 2021-02-12: qty 3

## 2021-02-12 MED ORDER — CEFAZOLIN SODIUM-DEXTROSE 2-4 GM/100ML-% IV SOLN
2.0000 g | INTRAVENOUS | Status: AC
  Administered 2021-02-12: 2 g via INTRAVENOUS

## 2021-02-12 MED ORDER — FENTANYL CITRATE (PF) 100 MCG/2ML IJ SOLN
INTRAMUSCULAR | Status: DC | PRN
  Administered 2021-02-12 (×2): 25 ug via INTRAVENOUS

## 2021-02-12 MED ORDER — TIMOLOL MALEATE 0.5 % OP SOLN
1.0000 [drp] | Freq: Every day | OPHTHALMIC | Status: DC
  Administered 2021-02-12 – 2021-02-14 (×3): 1 [drp] via OPHTHALMIC
  Filled 2021-02-12: qty 5

## 2021-02-12 MED ORDER — HEPARIN BOLUS VIA INFUSION
4000.0000 [IU] | Freq: Once | INTRAVENOUS | Status: AC
  Administered 2021-02-12: 4000 [IU] via INTRAVENOUS
  Filled 2021-02-12: qty 4000

## 2021-02-12 MED ORDER — OXYCODONE HCL 5 MG PO TABS: 5.0000 mg | ORAL_TABLET | Freq: Once | ORAL | Status: DC

## 2021-02-12 MED ORDER — ONDANSETRON HCL 4 MG/2ML IJ SOLN: 4.0000 mg | Freq: Four times a day (QID) | INTRAMUSCULAR | Status: DC | PRN

## 2021-02-12 MED ORDER — SODIUM CHLORIDE 0.9 % IV SOLN
1.0000 mg/h | INTRAVENOUS | Status: DC
  Administered 2021-02-12: 1 mg/h
  Filled 2021-02-12 (×3): qty 10

## 2021-02-12 MED ORDER — MORPHINE SULFATE (PF) 4 MG/ML IV SOLN
5.0000 mg | INTRAVENOUS | Status: DC | PRN
  Administered 2021-02-12: 5 mg via INTRAVENOUS

## 2021-02-12 MED ORDER — MIDAZOLAM HCL 2 MG/2ML IJ SOLN: 1.0000 mg | INTRAMUSCULAR | Status: DC | PRN

## 2021-02-12 MED ORDER — FENTANYL CITRATE (PF) 100 MCG/2ML IJ SOLN
12.5000 ug | INTRAMUSCULAR | Status: DC | PRN
  Administered 2021-02-12 – 2021-02-13 (×3): 12.5 ug via INTRAVENOUS
  Filled 2021-02-12 (×3): qty 2

## 2021-02-12 MED ORDER — MIDAZOLAM HCL 2 MG/2ML IJ SOLN
INTRAMUSCULAR | Status: DC | PRN
  Administered 2021-02-12: 1 mg via INTRAVENOUS
  Administered 2021-02-12: 2 mg via INTRAVENOUS
  Administered 2021-02-12: 1 mg via INTRAVENOUS

## 2021-02-12 MED ORDER — ALTEPLASE 2 MG IJ SOLR
INTRAMUSCULAR | Status: AC
  Filled 2021-02-12: qty 4

## 2021-02-12 MED ORDER — CEFAZOLIN SODIUM-DEXTROSE 2-4 GM/100ML-% IV SOLN
INTRAVENOUS | Status: AC
  Administered 2021-02-12: 2 g via INTRAVENOUS
  Filled 2021-02-12: qty 100

## 2021-02-12 MED ORDER — ALBUTEROL SULFATE (2.5 MG/3ML) 0.083% IN NEBU: 2.5000 mg | INHALATION_SOLUTION | RESPIRATORY_TRACT | Status: DC | PRN

## 2021-02-12 MED ORDER — HEPARIN (PORCINE) 25000 UT/250ML-% IV SOLN: 600.0000 [IU]/h | INTRAVENOUS | Status: DC

## 2021-02-12 MED ORDER — ACETAMINOPHEN 325 MG PO TABS: 650.0000 mg | ORAL_TABLET | Freq: Four times a day (QID) | ORAL | Status: DC | PRN

## 2021-02-12 SURGICAL SUPPLY — 24 items
BALLN LUTONIX 018 4X150X130 (BALLOONS) ×2
BALLN LUTONIX 018 4X220X130 (BALLOONS) ×2
BALLN LUTONIX 018 6X60X130 (BALLOONS) ×2
BALLOON LUTONIX 018 4X150X130 (BALLOONS) IMPLANT
BALLOON LUTONIX 018 4X220X130 (BALLOONS) IMPLANT
BALLOON LUTONIX 018 6X60X130 (BALLOONS) IMPLANT
CANISTER PENUMBRA ENGINE (MISCELLANEOUS) ×1 IMPLANT
CATH ANGIO 5F PIGTAIL 65CM (CATHETERS) ×1 IMPLANT
CATH INDIGO SEP 7 (CATHETERS) ×1 IMPLANT
CATH INFUS 90CMX20CM (CATHETERS) ×1 IMPLANT
CATH LIGHTNING 7 XTORQ 130 (CATHETERS) ×1 IMPLANT
CATH TEMPO 5F RIM 65CM (CATHETERS) ×1 IMPLANT
COVER PROBE U/S 5X48 (MISCELLANEOUS) ×1 IMPLANT
GLIDEWIRE ADV .035X260CM (WIRE) ×1 IMPLANT
KIT ENCORE 26 ADVANTAGE (KITS) ×1 IMPLANT
PACK ANGIOGRAPHY (CUSTOM PROCEDURE TRAY) ×2 IMPLANT
SHEATH BRITE TIP 5FRX11 (SHEATH) ×1 IMPLANT
SHEATH BRITE TIP 7FRX11 (SHEATH) ×1 IMPLANT
SHEATH FLEXOR ANSEL2 7FRX45 (SHEATH) ×1 IMPLANT
SUT SILK 0 FSL (SUTURE) ×1 IMPLANT
SYR MEDRAD MARK 7 150ML (SYRINGE) ×1 IMPLANT
TUBING CONTRAST HIGH PRESS 72 (TUBING) ×2 IMPLANT
WIRE G V18X300CM (WIRE) ×1 IMPLANT
WIRE GUIDERIGHT .035X150 (WIRE) ×1 IMPLANT

## 2021-02-12 SURGICAL SUPPLY — 11 items
CANISTER PENUMBRA ENGINE (MISCELLANEOUS) ×1 IMPLANT
CATH LIGHTNING 7 XTORQ 130 (CATHETERS) ×1 IMPLANT
COVER PROBE U/S 5X48 (MISCELLANEOUS) ×1 IMPLANT
DEVICE STARCLOSE SE CLOSURE (Vascular Products) ×1 IMPLANT
KIT ENCORE 26 ADVANTAGE (KITS) ×1 IMPLANT
PACK ANGIOGRAPHY (CUSTOM PROCEDURE TRAY) ×1 IMPLANT
STENT LIFESTREAM 9X58X80 (Permanent Stent) ×1 IMPLANT
STENT VIABAHN 7X50X120 (Permanent Stent) ×2 IMPLANT
STENT VIABAHN 7X5X120 7FR (Permanent Stent) IMPLANT
WIRE G V18X300CM (WIRE) ×1 IMPLANT
WIRE MAGIC TOR.035 180C (WIRE) ×1 IMPLANT

## 2021-02-12 NOTE — ED Notes (Signed)
Report provided to nurse at special procedure area.

## 2021-02-12 NOTE — ED Provider Notes (Signed)
[facility] Emergency Department Provider Note  Time seen: 7:34 AM  I have reviewed the triage vital signs and the nursing notes.   HISTORY  Chief Complaint Foot Pain   HPI [name] is a 87 y.o. female with a past medical history of allergies, arthritis, COPD, gastric reflux, chronic back pain, presents to the emergency department for right lower extremity pain.  According to EMS report and chart review patient was seen in the emergency department yesterday for nontraumatic right hip pain that started after awakening from a nap.  Patient had a negative work-up and was ultimately discharged home.  Patient states since going home her pain is continued to worsen with significant pain overnight that prevented her from sleeping.  States the pain is spread throughout the right lower extremity.  Patient appears uncomfortable, states severe pain throughout the right lower extremity.  Denies any known fever.  Largely negative review of systems otherwise.   Past Medical History:  Diagnosis Date   Adenomatous colon polyp 04/26/95   tubulovillous   Allergy    Arthritis    Bradycardia    Bursitis    Cataracts, bilateral    Constipation    COPD, moderate ([place])    "THIS WAS TOLD TO ME BACK IN THE DAYS WHEN EVERYONE THAT WAS  A SMOKER WAS TOLD THEY WERE A COPD. IM 89 AND I DONT GET SHORT OF BREATH , I DONT HAVE IT "   Cystitis    Degenerative joint disease    Depression    DENIES    Gastritis    GERD (gastroesophageal reflux disease)    Glaucoma    Hypoglycemia    Impaired memory    Insomnia    Lack of bladder control    Macular degeneration    OA (osteoarthritis)    Osteopenia    Renal cyst    right   Shortness of breath    Small bowel obstruction (HCC)    Trigeminal neuralgia    DENIES    Urinary incontinence    Wears dentures     Patient Active Problem List   Diagnosis Date Noted   Exudative age-related macular degeneration of left eye ([place])  01/13/2020   Advanced nonexudative age-related macular degeneration of both eyes with subfoveal involvement 01/13/2020   Choroidal neovascularization of right eye 01/13/2020   Primary open angle glaucoma of both eyes, severe stage 01/13/2020   Chronic renal disease, stage 4, severely decreased glomerular filtration rate (GFR) between 15-29 mL/min/1.73 square meter ([place]) 03/04/2019   Generalized abdominal tenderness without rebound tenderness 03/04/2019   UTI (urinary tract infection) 11/01/2018   Osteoarthritis of right hip 10/04/2018   TSH elevation 09/24/2018   Frequent UTI 11/09/2017   Leukocytosis 02/06/2016   Dysuria 06/05/2015   Hyperlipidemia with target LDL less than 160 09/24/2014   Routine general medical examination at a health care facility 09/24/2014   MACULAR DEGENERATION 08/07/2008   Glaucoma 08/07/2008   Insomnia 08/07/2008   Constipation 07/22/2008   NEURALGIA, TRIGEMINAL 01/07/2008   Allergic rhinitis 09/03/2007   COPD 09/03/2007   GERD 09/03/2007   Osteoarthritis 09/03/2007    Past Surgical History:  Procedure Laterality Date   ABDOMINAL HYSTERECTOMY  1992   nonmalignant reasons   BACK SURGERY  05/20/08   LUMBAR SPINAL FUSION    BREAST SURGERY     EXCISION OF CALCIUM DEPOSIT    CATARACT EXTRACTION Bilateral    CHOLECYSTECTOMY  1994   DILATION AND CURETTAGE OF UTERUS  EYE SURGERY     RIGHT EYE HEMORRHAGE SURGERY    JOINT REPLACEMENT  2005   LEFT HIP    PARTIAL COLECTOMY  04/26/95   tubulovillous adenoma   SHOULDER SURGERY  01/22/2009   left   TONSILLECTOMY     TOTAL HIP ARTHROPLASTY Right 10/04/2018   Procedure: TOTAL HIP ARTHROPLASTY ANTERIOR APPROACH;  Surgeon: Rod Can, MD;  Location[facility];  Service: Orthopedics;  Laterality: Right;   TOTAL SHOULDER ARTHROPLASTY Right 07/05/2012   Procedure: RIGHT TOTAL SHOULDER ARTHROPLASTY;  Surgeon: Marin Shutter, MD;  Location: [place];  Service: Orthopedics;  Laterality: Right;  Right total shoulder  arthroplasty    Prior to Admission medications   Medication Sig Start Date End Date Taking? Authorizing Provider  beta carotene w/minerals (OCUVITE) tablet Take 1 tablet by mouth 2 (two) times daily.    [provider]  conjugated estrogens (PREMARIN) vaginal cream Place 1 Applicatorful vaginally daily. Use pea sized amount M-W-Fr before bedtime 11/21/19   Hollice Espy, MD  docusate sodium (COLACE) 100 MG capsule Take 1 capsule (100 mg total) by mouth 2 (two) times daily. 10/05/18   Swinteck, Aaron Edelman, MD  famotidine (PEPCID) 20 MG tablet Take 20 mg by mouth at bedtime. 01/05/21   [provider]  lidocaine (LIDODERM) 5 % Place 1 patch onto the skin every 12 (twelve) hours. Remove & Discard patch within 12 hours or as directed by MD 02/11/21 02/11/22  Nance Pear, MD  morphine (MSIR) 15 MG tablet Take 15 mg by mouth 5 (five) times daily as needed. 01/18/21   [provider]  omeprazole (PRILOSEC) 20 MG capsule Take 20 mg by mouth daily before breakfast.     [provider]  timolol (TIMOPTIC) 0.5 % ophthalmic solution timolol maleate 0.5 % eye drops    [provider]    Allergies  Allergen Reactions   Codeine Itching and Nausea Only    Small amounts okay   Pregabalin Other (See Comments)    Confusion and hallucinations   Promethazine Hcl Other (See Comments)    Muscle cramps   Sulfonamide Derivatives Nausea Only    Family History  Problem Relation Age of Onset   Cancer Father        colon   Heart disease Sister    Colon polyps Brother    Diabetes Brother     Social History Social History   Tobacco Use   Smoking status: Former    Packs/day: 1.00    Years: 50.00    Pack years: 50.00    Types: Cigarettes    Quit date: 06/28/2005    Years since quitting: 15.6   Smokeless tobacco: Never  Substance Use Topics   Alcohol use: No   Drug use: No    Review of Systems Constitutional: Negative for fever. Cardiovascular: Negative  for chest pain. Respiratory: Negative for shortness of breath. Gastrointestinal: Negative for abdominal pain Genitourinary: Negative for urinary compaints Musculoskeletal: Severe right leg pain Skin: Negative for skin complaints  Neurological: Negative for headache.  Decreased sensation in the right leg. All other ROS negative  ____________________________________________   PHYSICAL EXAM:  VITAL SIGNS: ED Triage Vitals [02/12/21 0732]  Enc Vitals Group     BP 137/87     Pulse      Resp 18     Temp      Temp src      SpO2 100 %     Weight      Height  Head Circumference      Peak Flow      Pain Score 10     Pain Loc      Pain Edu?      Excl. in [place]?     Constitutional: Alert and oriented. Well appearing and in no distress. Eyes: Normal exam ENT      Head: Normocephalic and atraumatic.      Mouth/Throat: Mucous membranes are moist. Cardiovascular: Normal rate, regular rhythm. No murmurs, rubs, or gallops. Respiratory: Normal respiratory effort without tachypnea nor retractions. Breath sounds are clear  Gastrointestinal: Soft and nontender. No distention. Musculoskeletal: Patient has dusky discoloration of the right lower extremity that is cool to the touch.  Unable to Doppler a pulse either DP or PT.  Patient states she cannot feel me touching her foot. Neurologic:  Normal speech and language. No gross focal neurologic deficits Skin:  Skin is warm, dry and intact.  Psychiatric: Mood and affect are normal.   ____________________________________________   INITIAL IMPRESSION / ASSESSMENT AND PLAN / ED COURSE  Pertinent labs & imaging results that were available during my care of the patient were reviewed by me and considered in my medical decision making (see chart for details).   Patient presents to the emergency department for severe right leg pain.  Patient is moaning, appears uncomfortable.  Patient took her home pain medication which she is chronically  prescribed at home, without improvement.  Patient is coming from [facility] assisted living facility.  On examination patient does have somewhat dusky discoloration of the right lower extremity cool to the touch with decreased sensation and no DP or PT pulse palpated or dopplered.  Concern for ischemic limb.  I have paged vascular surgery.  We will begin heparin infusion.  Treat pain, check labs in anticipation for admission and possible operation. I spoke to Dr. Lucky Cowboy who will be taken the patient to the operating room.  Spoke to the hospitalist will be admitting to their service.  Heparin infusion ordered.  KADISHA GOODINE was evaluated in Emergency Department on 02/12/2021 for the symptoms described in the history of present illness. She was evaluated in the context of the global COVID-19 pandemic, which necessitated consideration that the patient might be at risk for infection with the SARS-CoV-2 virus that causes COVID-19. Institutional protocols and algorithms that pertain to the evaluation of patients at risk for COVID-19 are in a state of rapid change based on information released by regulatory bodies including the CDC and federal and state organizations. These policies and algorithms were followed during the patient's care in the ED. CRITICAL CARE Performed by: Harvest Dark   Total critical care time: 30 minutes  Critical care time was exclusive of separately billable procedures and treating other patients.  Critical care was necessary to treat or prevent imminent or life-threatening deterioration.  Critical care was time spent personally by me on the following activities: development of treatment plan with patient and/or surrogate as well as nursing, discussions with consultants, evaluation of patient's response to treatment, examination of patient, obtaining history from patient or surrogate, ordering and performing treatments and interventions, ordering and review of laboratory studies,  ordering and review of radiographic studies, pulse oximetry and re-evaluation of patient's condition.  ____________________________________________   FINAL CLINICAL IMPRESSION(S) / ED DIAGNOSES  Arterial occlusion, right lower extremity   Harvest Dark, MD 02/12/21 1510

## 2021-02-12 NOTE — Consult Note (Signed)
ANTICOAGULATION CONSULT NOTE - Initial Consult  Pharmacy Consult for Heparin Infusion Indication:  arterial occlusion   Allergies  Allergen Reactions   Codeine Itching and Nausea Only    Small amounts okay   Pregabalin Other (See Comments)    Confusion and hallucinations   Promethazine Hcl Other (See Comments)    Muscle cramps   Sulfonamide Derivatives Nausea Only    Patient Measurements:   Heparin Dosing Weight: 56.2 kg  Vital Signs: Temp: 98 F (36.7 C) (10/21 0845) Temp Source: Oral (10/21 0735) BP: 128/67 (10/21 0845) Pulse Rate: 74 (10/21 0845)  Labs: Recent Labs    02/12/21 0744  HGB 13.8  HCT 40.0  PLT 282  APTT 30  LABPROT 14.6  INR 1.1  CREATININE 0.92     Estimated Creatinine Clearance: 30.2 mL/min (by C-G formula based on SCr of 0.92 mg/dL).   Medical History: Past Medical History:  Diagnosis Date   Adenomatous colon polyp 04/26/95   tubulovillous   Allergy    Arthritis    Bradycardia    Bursitis    Cataracts, bilateral    Constipation    COPD, moderate ([place])    "THIS WAS TOLD TO ME BACK IN THE DAYS WHEN EVERYONE THAT WAS  A SMOKER WAS TOLD THEY WERE A COPD. IM 89 AND I DONT GET SHORT OF BREATH , I DONT HAVE IT "   Cystitis    Degenerative joint disease    Depression    DENIES    Gastritis    GERD (gastroesophageal reflux disease)    Glaucoma    Hypoglycemia    Impaired memory    Insomnia    Lack of bladder control    Macular degeneration    OA (osteoarthritis)    Osteopenia    Renal cyst    right   Shortness of breath    Small bowel obstruction (HCC)    Trigeminal neuralgia    DENIES    Urinary incontinence    Wears dentures     Medications:  No chronic DOAC use noted PTA  Assessment: 87yo F w/ PMH of depression, allergies, arthritis, COPD, gastric reflux, chronic back pain who presented to the ED for right lower extremity pain. Vascular surgery was consulted for high suspicion of RLE arterial occlusion and pt is s/p  thrombectomy on 10/21. Pharmacy was consulted for heparin monitoring.  Vascular surgery has placed orders for alteplase infusion at 1mg /hr  Goal of Therapy:  Heparin level 0.2-0.5 units/ml per vascular  Monitor platelets by anticoagulation protocol: Yes   Plan:  Start heparin infusion at 600 units/hr Heparin level, fibrinogen, and CBC q6h per vascular  Narda Rutherford, PharmD Pharmacy Resident  02/12/2021 11:21 AM

## 2021-02-12 NOTE — Consult Note (Signed)
[facility] Vascular Consult Note  MRN : [id number]  [name] is a 87 y.o. ([birth year]) female who presents with chief complaint of  Chief Complaint  Patient presents with   Foot Pain  .  History of Present Illness: Patient [name] presents to the emergency department with an approximately 24-hour history of right leg pain.  Yesterday this started as significant but not unbearable pain and has progressed to unbearable pain throughout the night and this morning.  She now has no motor function in the right foot and limited sensory function with a cool, mottled, right lower leg below the knee.  No previous history of revascularization or vascular disease to her knowledge.  No history of atrial fibrillation to her knowledge.  She is in 10 out of 10 pain that is not made better by any maneuvers or positioning.  No left leg symptoms.  Current Facility-Administered Medications  Medication Dose Route Frequency Provider Last Rate Last Admin   0.9 %  sodium chloride infusion   Intravenous Continuous Hendrix Yurkovich, Erskine Squibb, MD       0.9 %  sodium chloride infusion   Intravenous Continuous Ivor Costa, MD 75 mL/hr at 02/12/21 0848 New Bag at 02/12/21 0848   acetaminophen (TYLENOL) tablet 650 mg  650 mg Oral Q6H PRN Ivor Costa, MD       albuterol (PROVENTIL) (2.5 MG/3ML) 0.083% nebulizer solution 2.5 mg  2.5 mg Inhalation Q4H PRN Ivor Costa, MD       ceFAZolin (ANCEF) IVPB 2g/100 mL premix  2 g Intravenous 30 min Pre-Op Algernon Huxley, MD       fentaNYL (SUBLIMAZE) injection 12.5 mcg  12.5 mcg Intravenous Q3H PRN Ivor Costa, MD       heparin ADULT infusion 100 units/mL (25000 units/226mL)  1,000 Units/hr Intravenous Continuous Rito Ehrlich A, RPH   Stopped at 02/12/21 0848   ondansetron (ZOFRAN) injection 4 mg  4 mg Intravenous Q8H PRN Ivor Costa, MD        Past Medical History:  Diagnosis Date   Adenomatous colon polyp 04/26/95   tubulovillous   Allergy    Arthritis    Bradycardia     Bursitis    Cataracts, bilateral    Constipation    COPD, moderate ([place])    "THIS WAS TOLD TO ME BACK IN THE DAYS WHEN EVERYONE THAT WAS  A SMOKER WAS TOLD THEY WERE A COPD. IM 90 AND I DONT GET SHORT OF BREATH , I DONT HAVE IT "   Cystitis    Degenerative joint disease    Depression    DENIES    Gastritis    GERD (gastroesophageal reflux disease)    Glaucoma    Hypoglycemia    Impaired memory    Insomnia    Lack of bladder control    Macular degeneration    OA (osteoarthritis)    Osteopenia    Renal cyst    right   Shortness of breath    Small bowel obstruction ([facility])    Trigeminal neuralgia    DENIES    Urinary incontinence    Wears dentures     Past Surgical History:  Procedure Laterality Date   ABDOMINAL HYSTERECTOMY  1992   nonmalignant reasons   BACK SURGERY  05/20/08   LUMBAR SPINAL FUSION    BREAST SURGERY     EXCISION OF CALCIUM DEPOSIT    CATARACT EXTRACTION Bilateral    CHOLECYSTECTOMY  1994   DILATION AND  CURETTAGE OF UTERUS     EYE SURGERY     RIGHT EYE HEMORRHAGE SURGERY    JOINT REPLACEMENT  2005   LEFT HIP    PARTIAL COLECTOMY  04/26/95   tubulovillous adenoma   SHOULDER SURGERY  01/22/2009   left   TONSILLECTOMY     TOTAL HIP ARTHROPLASTY Right 10/04/2018   Procedure: TOTAL HIP ARTHROPLASTY ANTERIOR APPROACH;  Surgeon: Rod Can, MD;  Location: [facility];  Service: Orthopedics;  Laterality: Right;   TOTAL SHOULDER ARTHROPLASTY Right 07/05/2012   Procedure: RIGHT TOTAL SHOULDER ARTHROPLASTY;  Surgeon: Marin Shutter, MD;  Location: [facility];  Service: Orthopedics;  Laterality: Right;  Right total shoulder arthroplasty     Social History   Tobacco Use   Smoking status: Former    Packs/day: 1.00    Years: 50.00    Pack years: 50.00    Types: Cigarettes    Quit date: 06/28/2005    Years since quitting: 15.6   Smokeless tobacco: Never  Substance Use Topics   Alcohol use: No   Drug use: No     Family History  Problem Relation Age of  Onset   Cancer Father        colon   Heart disease Sister    Colon polyps Brother    Diabetes Brother     Allergies  Allergen Reactions   Codeine Itching and Nausea Only    Small amounts okay   Pregabalin Other (See Comments)    Confusion and hallucinations   Promethazine Hcl Other (See Comments)    Muscle cramps   Sulfonamide Derivatives Nausea Only     REVIEW OF SYSTEMS (Negative unless checked)  Constitutional: [] Weight loss  [] Fever  [] Chills Cardiac: [] Chest pain   [] Chest pressure   [] Palpitations   [] Shortness of breath when laying flat   [] Shortness of breath at rest   [x] Shortness of breath with exertion. Vascular:  [x] Pain in legs with walking   [x] Pain in legs at rest   [] Pain in legs when laying flat   [] Claudication   [] Pain in feet when walking  [x] Pain in feet at rest  [x] Pain in feet when laying flat   [] History of DVT   [] Phlebitis   [] Swelling in legs   [] Varicose veins   [] Non-healing ulcers Pulmonary:   [] Uses home oxygen   [] Productive cough   [] Hemoptysis   [] Wheeze  [x] COPD   [] Asthma Neurologic:  [] Dizziness  [] Blackouts   [] Seizures   [] History of stroke   [] History of TIA  [] Aphasia   [] Temporary blindness   [] Dysphagia   [] Weakness or numbness in arms   [x] Weakness or numbness in legs Musculoskeletal:  [x] Arthritis   [] Joint swelling   [x] Joint pain   [x] Low back pain Hematologic:  [] Easy bruising  [] Easy bleeding   [] Hypercoagulable state   [] Anemic  [] Hepatitis Gastrointestinal:  [] Blood in stool   [] Vomiting blood  [x] Gastroesophageal reflux/heartburn   [] Difficulty swallowing. Genitourinary:  [] Chronic kidney disease   [] Difficult urination  [] Frequent urination  [] Burning with urination   [] Blood in urine Skin:  [] Rashes   [] Ulcers   [] Wounds Psychological:  [] History of anxiety   []  History of major depression.  Physical Examination  Vitals:   02/12/21 0752 02/12/21 0800 02/12/21 0830 02/12/21 0845  BP: 119/70 129/73 127/73 128/67  Pulse: 80 86  87 74  Resp: (!) 26 19 16  (!) 21  Temp:    98 F (36.7 C)  TempSrc:      SpO2: 100%  94% 99% 97%   There is no height or weight on file to calculate BMI. Gen: Elderly white female who appears younger than her stated age but is in obvious discomfort Head: [place]/AT, No temporalis wasting.  Ear/Nose/Throat: Hearing diminished, nares w/o erythema or drainage, oropharynx w/o Erythema/Exudate Eyes: Sclera non-icteric, conjunctiva clear Neck: Trachea midline.  No JVD.  Pulmonary:  Good air movement, respirations not labored, equal bilaterally.  Cardiac: RRR, no JVD Vascular:  Vessel Right Left  Radial Palpable Palpable                          PT Not Palpable 1+ Palpable  DP Not Palpable 2+ Palpable   Musculoskeletal: Right leg is markedly ischemic and mottled with pallor up to the knee.  No palpable femoral or popliteal pulse and no pedal pulses.  Left leg is warm with good capillary refill.  No deformity or atrophy. No edema. Neurologic: No motor function in the right foot and limited sensory function in the right foot.  Proximal muscle groups are intact.  No arm or left leg weakness.  Speech is fluent. Motor exam as listed above. Psychiatric: Judgment intact, Mood & affect appropriate for pt's clinical situation. Dermatologic: No rashes or ulcers noted.  No cellulitis or open wounds.      CBC Lab Results  Component Value Date   WBC 15.7 (H) 02/12/2021   HGB 13.8 02/12/2021   HCT 40.0 02/12/2021   MCV 90.1 02/12/2021   PLT 282 02/12/2021    BMET    Component Value Date/Time   NA 141 02/12/2021 0744   NA 142 02/16/2016 0000   K 4.3 02/12/2021 0744   CL 107 02/12/2021 0744   CO2 19 (L) 02/12/2021 0744   GLUCOSE 146 (H) 02/12/2021 0744   BUN 24 (H) 02/12/2021 0744   BUN 14 02/16/2016 0000   CREATININE 0.92 02/12/2021 0744   CALCIUM 10.2 02/12/2021 0744   GFRNONAA 58 (L) 02/12/2021 0744   GFRAA >60 11/03/2018 0508   Estimated Creatinine Clearance: 30.2 mL/min (by C-G  formula based on SCr of 0.92 mg/dL).  COAG Lab Results  Component Value Date   INR 1.1 02/12/2021   INR 1.0 01/20/2021   INR 0.92 06/28/2012    Radiology CT HEAD WO CONTRAST (5MM)  Result Date: 01/20/2021 CLINICAL DATA:  Headache EXAM: CT HEAD WITHOUT CONTRAST TECHNIQUE: Contiguous axial images were obtained from the base of the skull through the vertex without intravenous contrast. COMPARISON:  None. FINDINGS: Brain: There is atrophy and chronic small vessel disease changes. No acute intracranial abnormality. Specifically, no hemorrhage, hydrocephalus, mass lesion, acute infarction, or significant intracranial injury. Vascular: No hyperdense vessel or unexpected calcification. Skull: No acute calvarial abnormality. Sinuses/Orbits: No acute findings Other: None IMPRESSION: Atrophy, chronic microvascular disease. No acute intracranial abnormality. Electronically Signed   By: Rolm Baptise M.D.   On: 01/20/2021 08:48   CT Cervical Spine Wo Contrast  Result Date: 01/20/2021 CLINICAL DATA:  Neck trauma (Age >= 65y), fall EXAM: CT CERVICAL SPINE WITHOUT CONTRAST TECHNIQUE: Multidetector CT imaging of the cervical spine was performed without intravenous contrast. Multiplanar CT image reconstructions were also generated. COMPARISON:  None. FINDINGS: Alignment: Slight degenerative anterolisthesis of C3 on C4. Skull base and vertebrae: No acute fracture. No primary bone lesion or focal pathologic process. Soft tissues and spinal canal: No prevertebral fluid or swelling. No visible canal hematoma. Disc levels: Diffuse advanced degenerative disc disease with disc space narrowing and spurring.  Diffuse advanced degenerative facet disease. Upper chest: No acute findings Other: None IMPRESSION: Diffuse advanced degenerative disc and facet disease. No acute bony abnormality. Electronically Signed   By: Rolm Baptise M.D.   On: 01/20/2021 08:54   DG Hip Unilat W or Wo Pelvis 2-3 Views Right  Result Date:  02/11/2021 CLINICAL DATA:  No acute bony abnormality. EXAM: DG HIP (WITH OR WITHOUT PELVIS) 2-3V RIGHT COMPARISON:  None. FINDINGS: 11/01/2018 IMPRESSION: Bilateral hip replacements. No hardware complicating feature. No acute bony abnormality. Specifically, no fracture, subluxation, or dislocation. Electronically Signed   By: Rolm Baptise M.D.   On: 02/11/2021 17:17      Assessment/Plan 1.  Profoundly ischemic right lower extremity.  Heparin has been initiated and patient will be taken emergently to angiography today.  She already has some loss of motor and sensory function and I am not sure her leg is salvageable, but we will try to perform revascularization this morning.  She will be at significant risk of reperfusion and compartment syndrome as well that we will have to monitor.  This is a very difficult situation with an extremely high risk of limb loss. 2. COPD.  Apparently diagnosed many years ago and has not had much trouble with it.  We can give albuterol as needed for shortness of breath and monitor oxygen saturations 3.  Arthritis.  Pain medication for low back pain and arthritis.  This new right leg pain is very different from her typical arthritic pain.   Leotis Pain, MD  02/12/2021 8:49 AM    This note was created with Dragon medical transcription system.  Any error is purely unintentional

## 2021-02-12 NOTE — H&P (Addendum)
History and Physical    [name] ZOX:[id number] DOB: 1926/03/10 DOA: 02/12/2021  Referring MD/NP/PA:   PCP: [facility], [facility], MD   Patient coming from:  The patient is coming from ALF.     Chief Complaint: right foot and lower leg pain.  HPI: [name] is a 87 y.o. female with medical history significant of hyperlipidemia, COPD, GERD, depression, urinary incontinence, small bowel obstruction, memory loss, bradycardia, UTI, who presents with right foot and left lower leg pain.  Patient has confusion with AMS when I saw pt, not sure about her baseline mental status. She is unable to provide medical history, therefore, most of the history is obtained by discussing the case with ED physician, per EMS report, and with the nursing staff.  Per ED physician, pt was seen in ED due to right hip pain. She had x-ray of right hip which showed bilateral hip replacement, but was negative for acute issues. After went home, she developed severe pain in right foot and  right lower extremity.  Her sensation in right foot is decreased.  Patient was found to have cool, mottled and dusky right foot and right lower leg. No DP or PT pulse is palpated or dopplered in ED per EDP. Pt responses to calling her name, but is hardly arousable.  She moves all extremities upon painful stimuli.  No active respiratory distress, nausea, vomiting, diarrhea noted.  Not sure if patient has chest pain or abdominal pain.  ED Course: pt was found to have WBC 15.7, INR 1.1, PTT 30, negative COVID PCR, renal function at baseline, temperature 99.5, blood pressure 159/92, 127/73, heart rate 54, RR 26, oxygen saturation 97% on room air.  Patient is admitted to [facility] bed as inpatient.  Dr. Lucky Cowboy of vascular surgery is consulted.  Review of Systems: Could not be reviewed due to altered mental status  Allergy:  Allergies  Allergen Reactions   Codeine Itching and Nausea Only    Small amounts okay   Pregabalin Other (See  Comments)    Confusion and hallucinations   Promethazine Hcl Other (See Comments)    Muscle cramps   Sulfonamide Derivatives Nausea Only    Past Medical History:  Diagnosis Date   Adenomatous colon polyp 04/26/95   tubulovillous   Allergy    Arthritis    Bradycardia    Bursitis    Cataracts, bilateral    Constipation    COPD, moderate ([place])    "THIS WAS TOLD TO ME BACK IN THE DAYS WHEN EVERYONE THAT WAS  A SMOKER WAS TOLD THEY WERE A COPD. IM 90 AND I DONT GET SHORT OF BREATH , I DONT HAVE IT "   Cystitis    Degenerative joint disease    Depression    DENIES    Gastritis    GERD (gastroesophageal reflux disease)    Glaucoma    Hypoglycemia    Impaired memory    Insomnia    Lack of bladder control    Macular degeneration    OA (osteoarthritis)    Osteopenia    Renal cyst    right   Shortness of breath    Small bowel obstruction (HCC)    Trigeminal neuralgia    DENIES    Urinary incontinence    Wears dentures     Past Surgical History:  Procedure Laterality Date   ABDOMINAL HYSTERECTOMY  1992   nonmalignant reasons   BACK SURGERY  05/20/08   LUMBAR SPINAL FUSION  BREAST SURGERY     EXCISION OF CALCIUM DEPOSIT    CATARACT EXTRACTION Bilateral    CHOLECYSTECTOMY  1994   DILATION AND CURETTAGE OF UTERUS     EYE SURGERY     RIGHT EYE HEMORRHAGE SURGERY    JOINT REPLACEMENT  2005   LEFT HIP    PARTIAL COLECTOMY  04/26/95   tubulovillous adenoma   SHOULDER SURGERY  01/22/2009   left   TONSILLECTOMY     TOTAL HIP ARTHROPLASTY Right 10/04/2018   Procedure: TOTAL HIP ARTHROPLASTY ANTERIOR APPROACH;  Surgeon: Rod Can, MD;  Location: [facility];  Service: Orthopedics;  Laterality: Right;   TOTAL SHOULDER ARTHROPLASTY Right 07/05/2012   Procedure: RIGHT TOTAL SHOULDER ARTHROPLASTY;  Surgeon: Marin Shutter, MD;  Location: [place];  Service: Orthopedics;  Laterality: Right;  Right total shoulder arthroplasty    Social History:  reports that she quit smoking about 15  years ago. Her smoking use included cigarettes. She has a 50.00 pack-year smoking history. She has never used smokeless tobacco. She reports that she does not drink alcohol and does not use drugs.  Family History:  Family History  Problem Relation Age of Onset   Cancer Father        colon   Heart disease Sister    Colon polyps Brother    Diabetes Brother      Prior to Admission medications   Medication Sig Start Date End Date Taking? Authorizing Provider  beta carotene w/minerals (OCUVITE) tablet Take 1 tablet by mouth 2 (two) times daily.    [provider]  conjugated estrogens (PREMARIN) vaginal cream Place 1 Applicatorful vaginally daily. Use pea sized amount M-W-Fr before bedtime 11/21/19   Hollice Espy, MD  docusate sodium (COLACE) 100 MG capsule Take 1 capsule (100 mg total) by mouth 2 (two) times daily. 10/05/18   Swinteck, Aaron Edelman, MD  famotidine (PEPCID) 20 MG tablet Take 20 mg by mouth at bedtime. 01/05/21   [provider]  lidocaine (LIDODERM) 5 % Place 1 patch onto the skin every 12 (twelve) hours. Remove & Discard patch within 12 hours or as directed by MD 02/11/21 02/11/22  Nance Pear, MD  morphine (MSIR) 15 MG tablet Take 15 mg by mouth 5 (five) times daily as needed. 01/18/21   [provider]  omeprazole (PRILOSEC) 20 MG capsule Take 20 mg by mouth daily before breakfast.     [provider]  timolol (TIMOPTIC) 0.5 % ophthalmic solution timolol maleate 0.5 % eye drops    [provider]    Physical Exam: Vitals:   02/12/21 1230 02/12/21 1245 02/12/21 1300 02/12/21 1330  BP: 134/88  (!) 144/87   Pulse: 82 79 (!) 119   Resp: 14 16 16    Temp:      TempSrc:      SpO2: 97% 100% 100% 100%   General: Not in acute distress HEENT:       Eyes: PERRL, EOMI, no scleral icterus.       ENT: No discharge from the ears and nose       Neck: No JVD, no bruit, no mass felt. Heme: No neck lymph node enlargement. Cardiac: S1/S2,  RRR, No murmurs, No gallops or rubs. Respiratory: No rales, wheezing, rhonchi or rubs. GI: Soft, nondistended, nontender, no organomegaly, BS present. GU: No hematuria Ext: both legs are cool. The right leg is dusky and discolored, no palpable DP/PT pulse. Musculoskeletal: No joint deformities, No joint redness or warmth, no limitation of  ROM in spin. Skin: No rashes.  Neuro: pt seems to know her own name and responses to calling her name, but pleasantly arousable, not orientated x3. Cranial nerves II-XII grossly intact, moves all extremities on painful stimuli. Psych: Patient is not psychotic.  Labs on Admission: I have personally reviewed following labs and imaging studies  CBC: Recent Labs  Lab 02/12/21 0744  WBC 15.7*  HGB 13.8  HCT 40.0  MCV 90.1  PLT 664   Basic Metabolic Panel: Recent Labs  Lab 02/12/21 0744  NA 141  K 4.3  CL 107  CO2 19*  GLUCOSE 146*  BUN 24*  CREATININE 0.92  CALCIUM 10.2   GFR: Estimated Creatinine Clearance: 30.2 mL/min (by C-G formula based on SCr of 0.92 mg/dL). Liver Function Tests: Recent Labs  Lab 02/12/21 0744  AST 96*  ALT 23  ALKPHOS 53  BILITOT 1.1  PROT 7.8  ALBUMIN 3.5   No results for input(s): LIPASE, AMYLASE in the last 168 hours. No results for input(s): AMMONIA in the last 168 hours. Coagulation Profile: Recent Labs  Lab 02/12/21 0744  INR 1.1   Cardiac Enzymes: No results for input(s): CKTOTAL, CKMB, CKMBINDEX, TROPONINI in the last 168 hours. BNP (last 3 results) No results for input(s): PROBNP in the last 8760 hours. HbA1C: No results for input(s): HGBA1C in the last 72 hours. CBG: No results for input(s): GLUCAP in the last 168 hours. Lipid Profile: No results for input(s): CHOL, HDL, LDLCALC, TRIG, CHOLHDL, LDLDIRECT in the last 72 hours. Thyroid Function Tests: No results for input(s): TSH, T4TOTAL, FREET4, T3FREE, THYROIDAB in the last 72 hours. Anemia Panel: No results for input(s): VITAMINB12,  FOLATE, FERRITIN, TIBC, IRON, RETICCTPCT in the last 72 hours. Urine analysis:    Component Value Date/Time   COLORURINE YELLOW 10/18/2019 1349   APPEARANCEUR CLEAR 10/18/2019 1349   LABSPEC 1.025 10/18/2019 1349   PHURINE 5.5 10/18/2019 1349   GLUCOSEU NEGATIVE 10/18/2019 1349   HGBUR NEGATIVE 10/18/2019 1349   HGBUR negative 10/09/2008 1339   BILIRUBINUR neg 11/04/2019 1519   KETONESUR NEGATIVE 10/18/2019 1349   PROTEINUR Positive (A) 11/04/2019 1519   PROTEINUR 30 (A) 12/09/2018 1325   UROBILINOGEN 1.0 11/04/2019 1519   UROBILINOGEN 0.2 10/18/2019 1349   NITRITE pos 11/04/2019 1519   NITRITE NEGATIVE 10/18/2019 1349   LEUKOCYTESUR Trace (A) 11/04/2019 1519   LEUKOCYTESUR SMALL (A) 10/18/2019 1349   Sepsis Labs: @LABRCNTIP (procalcitonin:4,lacticidven:4) ) Recent Results (from the past 240 hour(s))  Resp Panel by RT-PCR (Flu A&B, Covid) Nasopharyngeal Swab     Status: None   Collection Time: 02/12/21  7:46 AM   Specimen: Nasopharyngeal Swab; Nasopharyngeal(NP) swabs in vial transport medium  Result Value Ref Range Status   SARS Coronavirus 2 by RT PCR NEGATIVE NEGATIVE Final    Comment: (NOTE) SARS-CoV-2 target nucleic acids are NOT DETECTED.  The SARS-CoV-2 RNA is generally detectable in upper respiratory specimens during the acute phase of infection. The lowest concentration of SARS-CoV-2 viral copies this assay can detect is 138 copies/mL. A negative result does not preclude SARS-Cov-2 infection and should not be used as the sole basis for treatment or other patient management decisions. A negative result may occur with  improper specimen collection/handling, submission of specimen other than nasopharyngeal swab, presence of viral mutation(s) within the areas targeted by this assay, and inadequate number of viral copies(<138 copies/mL). A negative result must be combined with clinical observations, patient history, and epidemiological information. The expected result  is Negative.  Fact  Sheet for Patients:  EntrepreneurPulse.com.au  Fact Sheet for Healthcare Providers:  IncredibleEmployment.be  This test is no t yet approved or cleared by the [place] FDA and  has been authorized for detection and/or diagnosis of SARS-CoV-2 by FDA under an Emergency Use Authorization (EUA). This EUA will remain  in effect (meaning this test can be used) for the duration of the COVID-19 declaration under Section 564(b)(1) of the Act, 21 U.S.C.section 360bbb-3(b)(1), unless the authorization is terminated  or revoked sooner.       Influenza A by PCR NEGATIVE NEGATIVE Final   Influenza B by PCR NEGATIVE NEGATIVE Final    Comment: (NOTE) The Xpert Xpress SARS-CoV-2/FLU/RSV plus assay is intended as an aid in the diagnosis of influenza from Nasopharyngeal swab specimens and should not be used as a sole basis for treatment. Nasal washings and aspirates are unacceptable for Xpert Xpress SARS-CoV-2/FLU/RSV testing.  Fact Sheet for Patients: EntrepreneurPulse.com.au  Fact Sheet for Healthcare Providers: IncredibleEmployment.be  This test is not yet approved or cleared by the [place] FDA and has been authorized for detection and/or diagnosis of SARS-CoV-2 by FDA under an Emergency Use Authorization (EUA). This EUA will remain in effect (meaning this test can be used) for the duration of the COVID-19 declaration under Section 564(b)(1) of the Act, 21 U.S.C. section 360bbb-3(b)(1), unless the authorization is terminated or revoked.  Performed at [facility], [address], [place], [place] [address]      Radiological Exams on Admission: PERIPHERAL VASCULAR CATHETERIZATION  Result Date: 02/12/2021 See surgical note for result.  DG Hip Unilat W or Wo Pelvis 2-3 Views Right  Result Date: 02/11/2021 CLINICAL DATA:  No acute bony abnormality. EXAM: DG HIP (WITH OR  WITHOUT PELVIS) 2-3V RIGHT COMPARISON:  None. FINDINGS: 11/01/2018 IMPRESSION: Bilateral hip replacements. No hardware complicating feature. No acute bony abnormality. Specifically, no fracture, subluxation, or dislocation. Electronically Signed   By: Rolm Baptise M.D.   On: 02/11/2021 17:17     EKG: I have personally reviewed.  Sinus rhythm, QTC 453, low voltage.  Assessment/Plan Principal Problem:   Ischemia of right lower extremity Active Problems:   COPD (chronic obstructive pulmonary disease) (HCC)   GERD   Leukocytosis   CKD (chronic kidney disease), stage IIIa   HLD (hyperlipidemia)   Acute metabolic encephalopathy   Ischemia of right lower extremity: pt has patient has critical ischemic limb in right lower extremity.  IV heparin is started in ED.  Dr. Lucky Cowboy of [facility] is consulted, urgent percutaneous Study/Intervention Procedure will be performed.   -Will admit to ICU - IV heparin - prn fentanyl for pain -f/u [facility] recommendations  COPD (chronic obstructive pulmonary disease) ([place]): stable -As needed albuterol  GERD -Protonix  Leukocytosis: WBC 15.7, no source of infection identified, likely reactive -Follow-up with CBC  CKD (chronic kidney disease), stage IIIa: Stable.  Creatinine 0.92, BUN 24 -Follow-up with BMP  HLD (hyperlipidemia): Patient is not taking statin -Follow-up with PCP  Acute metabolic encephalopathy: Not sure about baseline mental status.  Since patient is receiving heparin and thrombolysis treatment, will get CT head -Frequent neurochecks -Follow-up CT of head -f/u UA         DVT ppx: on IV Heparin    Code Status: DNR (pt has DNR paper document from facility) Family Communication:    Yes, patient's sister by phone Disposition Plan: to be determined Consults called:  Dr. Lucky Cowboy of [facility] Admission status and Level of care: ICU:   as inpt  Status is: Inpatient  Remains inpatient appropriate because: pt has critical limb ischemia in right  lower extremities.  Her presentation is highly complicated. Patient will need procedure by [facility].  Given her older age, patient is at high risk of deteriorating.  Will need to be treated in the hospital for at least 2 days           Date of Service 02/12/2021    [facility]   If 7PM-7AM, please contact night-coverage [email or web address] 02/12/2021, 4:57 PM

## 2021-02-12 NOTE — Op Note (Signed)
[facility]  Percutaneous Study/Intervention Procedural Note   Date of Surgery: 02/12/2021  Surgeon(s):Kahil Agner  MD  Assistants: Elmore Guise MD  Pre-operative Diagnosis: PAD with rest pain status post continuous thrombolytic therapy  Post-operative diagnosis:  Same  Procedure(s) Performed:             1.  Aortogram and right lower extremity angiogram             2.  Catheter placement into right popliteal artery from left femoral approach             3.  Mechanical thrombectomy of right common femoral artery, profunda femoris artery, superficial femoral artery, and popliteal arteries with the penumbra CAT 7 device             4.  Viabahn stent placement to the right common femoral artery with 7 mm diameter by 5 cm length stent             5.  Lifestream stent placement to the left common iliac artery with 9 mm diameter by 58 mm length lifestream stent  6.  StarClose closure device left femoral artery  EBL: 100 cc  Contrast: 60 cc  Fluoro Time: 5.7 minutes  Moderate Conscious Sedation Time: approximately 32 minutes using 2 mg of Versed and 50 mcg of Fentanyl              Indications:  Patient is a 87 y.o.female with profound ischemia of the right leg as well as having thrombus in both common iliac arteries and distal aorta at the completion the procedure today.  A thrombolytic catheter was left in place and she is brought back for second look angiography.  Due to the limb threatening nature of the situation, angiogram was performed for attempted limb salvage. The patient is aware that if the procedure fails, amputation would be expected.  The patient also understands that even with successful revascularization, amputation may still be required due to the severity of the situation.  Risks and benefits are discussed and informed consent is obtained.   Procedure:  The patient was identified and appropriate procedural time out was performed.  The patient was  then placed supine on the table and prepped and draped in the usual sterile fashion. Moderate conscious sedation was administered during a face to face encounter with the patient throughout the procedure with my supervision of the RN administering medicines and monitoring the patient's vital signs, pulse oximetry, telemetry and mental status throughout from the start of the procedure until the patient was taken to the recovery room.  The wire from the existing thrombolytic catheter that was parked near the top of the femoral head was removed and imaging was performed through the catheter.  This demonstrated reocclusion of the common femoral artery with minimal flow distally.  I then used a V 18 wire and first got in the profunda femoris artery and used the penumbra CAT 7 catheter in the common femoral artery and profunda femoris artery clearing a channel.  The wire was then directed down into the SFA and popliteal arteries were more thrombectomy was performed and significant thrombus burden was removed with the penumbra CAT 7 catheter.  Imaging following this showed the profunda femoral started to be open.  The common femoral artery still had stenosis with some thrombus and I elected to place a covered stent in this area.  A 7 mm diameter by 5 cm length Viabahn stent was deployed with less than 10%  residual stenosis after stent deployment.  Imaging of the remainder of the SFA, popliteal artery, showed flow with a small amount of residual thrombus but no significant stenosis.  There appeared to be runoff through the peroneal and posterior tibial arteries although there was some spasm distally from the wire.  Imaging of the aorta and iliac arteries showed the right common iliac artery not to have any significant residual thrombus and only mild stenosis of less than 40%.  The distal aortic thrombus was resolved.  There was stenosis and thrombus in the left common iliac artery creating a 50 to 60% stenosis.  I elected  to treat this with a covered stent.  We exchanged for a 0.035 wire and used a 9 mm diameter by 58 mm length lifestream stent from the origin of the left common iliac artery to just above the left hypogastric artery.  This inflated 12 atm with less than 10% residual stenosis after stent placement.  I elected to terminate the procedure. The sheath was removed and StarClose closure device was deployed in the left femoral artery with excellent hemostatic result. The patient was taken to the recovery room in stable condition having tolerated the procedure well.  Findings:               Aortogram: Resolution of the thrombus in the distal aorta and right common iliac artery with thrombus and stenosis residual in the left common iliac artery treated with stent placement             Right lower Extremity: Rethrombosis of the right common femoral artery, profunda femoris artery, and superficial femoral artery that was able to be open with thrombectomy and stent placement to the common femoral artery.   Disposition: Patient was taken to the recovery room in stable condition having tolerated the procedure well.  Complications: None  Leotis Pain 02/12/2021 5:49 PM   This note was created with Dragon Medical transcription system. Any errors in dictation are purely unintentional.

## 2021-02-12 NOTE — Consult Note (Signed)
ANTICOAGULATION CONSULT NOTE - Initial Consult  Pharmacy Consult for Heparin Infusion Indication:  arterial occlusion   Allergies  Allergen Reactions   Codeine Itching and Nausea Only    Small amounts okay   Pregabalin Other (See Comments)    Confusion and hallucinations   Promethazine Hcl Other (See Comments)    Muscle cramps   Sulfonamide Derivatives Nausea Only    Patient Measurements:   Heparin Dosing Weight: 56.2 kg  Vital Signs: Temp: 99.5 F (37.5 C) (10/21 0735) Temp Source: Oral (10/21 0735) BP: 119/70 (10/21 0752) Pulse Rate: 80 (10/21 0752)  Labs: Recent Labs    02/12/21 0744  HGB 13.8  HCT 40.0  PLT 282  CREATININE 0.92    Estimated Creatinine Clearance: 30.2 mL/min (by C-G formula based on SCr of 0.92 mg/dL).   Medical History: Past Medical History:  Diagnosis Date   Adenomatous colon polyp 04/26/95   tubulovillous   Allergy    Arthritis    Bradycardia    Bursitis    Cataracts, bilateral    Constipation    COPD, moderate ([place])    "THIS WAS TOLD TO ME BACK IN THE DAYS WHEN EVERYONE THAT WAS  A SMOKER WAS TOLD THEY WERE A COPD. IM 90 AND I DONT GET SHORT OF BREATH , I DONT HAVE IT "   Cystitis    Degenerative joint disease    Depression    DENIES    Gastritis    GERD (gastroesophageal reflux disease)    Glaucoma    Hypoglycemia    Impaired memory    Insomnia    Lack of bladder control    Macular degeneration    OA (osteoarthritis)    Osteopenia    Renal cyst    right   Shortness of breath    Small bowel obstruction (HCC)    Trigeminal neuralgia    DENIES    Urinary incontinence    Wears dentures     Medications:  No chronic DOAC use noted PTA  Assessment: Pharmacy has been consulted to initiate heparin infusion in 87yo patient presenting to the ED with right lower extremity pain. According to EMS report and chart review, patient was seen in the ED on 02/11/21 for nontraumatic right hip pain that started after waking up from a  nap. Had a negative work-up and was ultimately discharged home. Returned today stating his pain has worsened. High suspicion for RLE arterial occlusion. Vascular has been consulted.   Baseline labs: Hgb 13.8, Plts 282, aPTT 30 sec, INR 1.1  Goal of Therapy:  Heparin level 0.3-0.7 units/ml Monitor platelets by anticoagulation protocol: Yes   Plan:  Give 4000 units bolus x 1 Start heparin infusion at 1000 units/hr Check anti-Xa level in 8 hours and daily while on heparin Continue to monitor H&H and platelets  Kennis Wissmann A Elvy Mclarty 02/12/2021,8:20 AM

## 2021-02-12 NOTE — Progress Notes (Signed)
Patient arrived to ICU.  Bedside report given by [facility] RN.  Patient hooked up to monitor and CHG completed.  Patient is shivering and lethargic.  Post tib pulse on right only to be obtained by Doppler.

## 2021-02-12 NOTE — Op Note (Signed)
[facility]  Percutaneous Study/Intervention Procedural Note   Date of Surgery: 02/12/2021  Surgeon(s):Libbie Bartley    Assistants:none  Pre-operative Diagnosis: Profound acute ischemia of the right lower extremity  Post-operative diagnosis:  Same  Procedure(s) Performed:             1.  Ultrasound guidance for vascular access left femoral artery             2.  Catheter placement into right SFA from left femoral approach             3.  Aortogram and selective right lower extremity angiogram  4.  Catheter directed thrombolytic therapy with 6 mg of tPA to the right common femoral artery and superficial femoral artery             5.  Mechanical thrombectomy of the right external iliac artery, common femoral artery, superficial femoral artery, popliteal artery, tibioperoneal trunk, and profunda femoris artery with the penumbra CAT 7 catheter             6.  Percutaneous transluminal angioplasty of the right SFA with 4 mm diameter by 22 cm length and 4 mm diameter by 15 cm length Lutonix drug-coated angioplasty balloons  7.  Percutaneous transluminal angioplasty of right external iliac artery and most proximal common femoral artery with 6 mm diameter by 6 cm length Lutonix drug-coated angioplasty balloon             8.  Placement of infusion catheter continued thrombolytic therapy in both iliac arteries and the distal aorta across the aortic bifurcation with a 90 cm total length 20 cm working length catheter   EBL: 200 cc  Contrast: 60 cc  Fluoro Time: 15.6 minutes  Moderate Conscious Sedation Time: approximately 84 minutes using 4 mg of Versed and 25 mcg of Fentanyl              Indications:  Patient is a 87 y.o.female with a profoundly ischemic right leg for approximately 24 hours. The patient is brought in for angiography for further evaluation and potential treatment.  Due to the limb threatening nature of the situation, angiogram was performed for attempted  limb salvage. The patient is aware that if the procedure fails, amputation would be expected.  The patient also understands that even with successful revascularization, amputation may still be required due to the severity of the situation. Risks and benefits are discussed and informed consent is obtained.   Procedure:  The patient was identified and appropriate procedural time out was performed.  The patient was then placed supine on the table and prepped and draped in the usual sterile fashion. Moderate conscious sedation was administered during a face to face encounter with the patient throughout the procedure with my supervision of the RN administering medicines and monitoring the patient's vital signs, pulse oximetry, telemetry and mental status throughout from the start of the procedure until the patient was taken to the recovery room. Ultrasound was used to evaluate the left common femoral artery.  It was patent .  A digital ultrasound image was acquired.  A Seldinger needle was used to access the left common femoral artery under direct ultrasound guidance and a permanent image was performed.  A 0.035 J wire was advanced without resistance and a 5Fr sheath was placed.  Pigtail catheter was placed into the aorta and an AP aortogram was performed. This demonstrated that the renal arteries were patent.  The aorta is patent to the distal  aorta where there was thrombus and disease in the distal aorta and both common iliac arteries the degree of which was difficult to discern due to spinal hardware and bowel gas. I then crossed the aortic bifurcation and advanced to the right femoral head. Selective right lower extremity angiogram was then performed. This demonstrated occlusion of the distal right external iliac artery and common femoral artery with essentially no flow distally.  Both the profunda femoris artery and superficial femoral artery were thrombosed.  I then advanced the rim catheter well into the  superficial femoral artery and this showed the mid to distal superficial femoral artery to have flow but then another occlusion in the popliteal artery with no flow distally. It was felt that it was in the patient's best interest to proceed with intervention after these images to avoid a second procedure and a larger amount of contrast and fluoroscopy based off of the findings from the initial angiogram. The patient was systemically heparinized and a 7 [place] Ansell sheath was then placed over the [employer] wire. I then used a Kumpe catheter and instilled 6 mg of tPA in the right common femoral artery and superficial femoral artery proximally.  I then selected the penumbra CAT 7 catheter and made multiple passes from the right external iliac artery down to the common femoral artery and superficial femoral artery initially.  I was able to clear much of this area and then advanced down into the popliteal artery and tibioperoneal trunk.  In the tibial vessels I used the separator and multiple passes mechanical thrombectomy with the penumbra CAT 7 catheter were performed.  With the catheter in the mid SFA, we found that there was multiple areas of greater than 60% stenosis throughout a small superficial femoral artery but there was now some flow.  The popliteal artery, peroneal artery, and posterior tibial arteries were patent.  2 inflations with 4 mm diameter Lutonix drug-coated angioplasty balloons were performed from the distal SFA up to the SFA origin and these were both 8 to 10 atm for 1 minute.  Completion imaging showed less than 20% residual stenosis.  There remained occlusion in the profunda femoris artery from thrombus nondirect the penumbra CAT 7 catheter into the profunda femoris artery and made a pass performing mechanical thrombectomy with the penumbra CAT 7 catheter in the profunda femoris artery.  This cleared the profunda and there was now flow in the profunda femoris artery, but the distal  external iliac artery and proximal common femoral artery still had what appeared to be thrombus and possible stenosis versus dissection.  I then ballooned that area with a 6 mm diameter by 6 cm length Lutonix drug-coated angioplasty balloon inflated to 8 atm for 1 minute with less than 10% residual stenosis at the site.  There is still very sluggish inflow.  There was thrombus in the distal aorta and both common iliac arteries worse on the right than the left.  After exchanging for a short 7 [place] sheath, I felt our best chance for success would be to perform several hours of catheter directed thrombolytic therapy for the distal aorta and both common iliac arteries and then come back and stick the right femoral artery and plan kissing stents in the proximal common iliac artery.  I was leery of doing that immediately after angioplasty in the artery at this setting, and also felt that several more hours of tPA would help debulk the clot in the distal aorta and both common iliac arteries.  I selected a 90 cm total length 20 cm working length thrombolytic catheter and parked this across the aortic bifurcation with about 5 cm in the left common iliac artery and about 15 cm in the right iliac system down to the top of the femoral head.  This was secured in place with silk suture as was the sheath which would run heparin.  The patient was taken to the recovery room in stable condition having tolerated the procedure well.  Findings:               Aortogram: Renal arteries were patent.  The aorta is patent to the distal aorta where there was thrombus and disease in the distal aorta and both common iliac arteries the degree of which was difficult to discern due to spinal hardware and bowel gas.             Right lower Extremity:  This demonstrated occlusion of the distal right external iliac artery and common femoral artery with essentially no flow distally.  Both the profunda femoris artery and superficial femoral  artery were thrombosed.  I then advanced the rim catheter well into the superficial femoral artery and this showed the mid to distal superficial femoral artery to have flow but then another occlusion in the popliteal artery with no flow distally.   Disposition: Patient was taken to the recovery room in stable condition having tolerated the procedure well.  Complications: None  Leotis Pain 02/12/2021 10:36 AM   This note was created with Dragon Medical transcription system. Any errors in dictation are purely unintentional.

## 2021-02-12 NOTE — ED Triage Notes (Signed)
Pt to ED via [facility] from [facility]. Pt seen yesterday for right hip pain and was discharged home. Pt woke up this morning with increased pain in her right foot. Per EMS, no palpable pulse in the right foot, foot cold to touch and discolored. Pt took Morphine 30 mg PO at 0600.   Upon arrival to ED pts foot dopplered by EDP, no pulse present

## 2021-02-12 NOTE — Progress Notes (Signed)
Genelle Bal RN spoke with Mudlogger of resident services at [facility] to alert them that the patient is being admitted to the hospital as well as to update her on patient status.

## 2021-02-13 ENCOUNTER — Inpatient Hospital Stay: Payer: Medicare Other | Admitting: Anesthesiology

## 2021-02-13 ENCOUNTER — Encounter
Admission: EM | Disposition: A | Discharge status: Hospice | Payer: Self-pay | Source: Home / Self Care | Attending: Hospitalist

## 2021-02-13 DIAGNOSIS — T79A21A Traumatic compartment syndrome of right lower extremity, initial encounter: Secondary | ICD-10-CM | POA: Diagnosis present

## 2021-02-13 DIAGNOSIS — L8990 Pressure ulcer of unspecified site, unspecified stage: Secondary | ICD-10-CM | POA: Insufficient documentation

## 2021-02-13 DIAGNOSIS — I998 Other disorder of circulatory system: Secondary | ICD-10-CM | POA: Diagnosis not present

## 2021-02-13 DIAGNOSIS — M1990 Unspecified osteoarthritis, unspecified site: Secondary | ICD-10-CM | POA: Diagnosis not present

## 2021-02-13 HISTORY — PX: FASCIOTOMY: SHX132

## 2021-02-13 HISTORY — PX: APPLICATION OF WOUND VAC: SHX5189

## 2021-02-13 LAB — CBC
HCT: 35.1 % — ABNORMAL LOW (ref 36.0–46.0)
HCT: 36 % (ref 36.0–46.0)
Hemoglobin: 11.4 g/dL — ABNORMAL LOW (ref 12.0–15.0)
Hemoglobin: 11.7 g/dL — ABNORMAL LOW (ref 12.0–15.0)
MCH: 29.8 pg (ref 26.0–34.0)
MCH: 31.2 pg (ref 26.0–34.0)
MCHC: 31.7 g/dL (ref 30.0–36.0)
MCHC: 33.3 g/dL (ref 30.0–36.0)
MCV: 93.6 fL (ref 80.0–100.0)
MCV: 94.2 fL (ref 80.0–100.0)
Platelets: 227 10*3/uL (ref 150–400)
Platelets: 239 10*3/uL (ref 150–400)
RBC: 3.75 MIL/uL — ABNORMAL LOW (ref 3.87–5.11)
RBC: 3.82 MIL/uL — ABNORMAL LOW (ref 3.87–5.11)
RDW: 13.6 % (ref 11.5–15.5)
RDW: 13.8 % (ref 11.5–15.5)
WBC: 25.9 10*3/uL — ABNORMAL HIGH (ref 4.0–10.5)
WBC: 28.6 10*3/uL — ABNORMAL HIGH (ref 4.0–10.5)
nRBC: 0 % (ref 0.0–0.2)
nRBC: 0 % (ref 0.0–0.2)

## 2021-02-13 LAB — URINALYSIS, COMPLETE (UACMP) WITH MICROSCOPIC
Bacteria, UA: NONE SEEN
Bilirubin Urine: NEGATIVE
Glucose, UA: NEGATIVE mg/dL
Ketones, ur: 5 mg/dL — AB
Leukocytes,Ua: NEGATIVE
Nitrite: NEGATIVE
Protein, ur: 100 mg/dL — AB
Specific Gravity, Urine: >1.046 — ABNORMAL HIGH (ref 1.005–1.030)
pH: 5 (ref 5.0–8.0)

## 2021-02-13 LAB — BASIC METABOLIC PANEL
Anion gap: 10 (ref 5–15)
BUN: 24 mg/dL — ABNORMAL HIGH (ref 8–23)
CO2: 19 mmol/L — ABNORMAL LOW (ref 22–32)
Calcium: 8.8 mg/dL — ABNORMAL LOW (ref 8.9–10.3)
Chloride: 111 mmol/L (ref 98–111)
Creatinine, Ser: 1.03 mg/dL — ABNORMAL HIGH (ref 0.44–1.00)
GFR, Estimated: 51 mL/min — ABNORMAL LOW (ref >60)
Glucose, Bld: 120 mg/dL — ABNORMAL HIGH (ref 70–99)
Potassium: 5.1 mmol/L (ref 3.5–5.1)
Sodium: 140 mmol/L (ref 135–145)

## 2021-02-13 LAB — GLUCOSE, CAPILLARY
Glucose-Capillary: 86 mg/dL (ref 70–99)
Glucose-Capillary: 104 mg/dL — ABNORMAL HIGH (ref 70–99)
Glucose-Capillary: 63 mg/dL — ABNORMAL LOW (ref 70–99)
Glucose-Capillary: 157 mg/dL — ABNORMAL HIGH (ref 70–99)
Glucose-Capillary: 88 mg/dL (ref 70–99)
Glucose-Capillary: 71 mg/dL (ref 70–99)

## 2021-02-13 LAB — TYPE AND SCREEN
ABO/RH(D): A POS
Antibody Screen: NEGATIVE

## 2021-02-13 SURGERY — FASCIOTOMY, UPPER EXTREMITY
Anesthesia: General | Site: Leg Lower | Laterality: Right

## 2021-02-13 MED ORDER — ACETAMINOPHEN 10 MG/ML IV SOLN
1000.0000 mg | Freq: Once | INTRAVENOUS | Status: DC | PRN
  Administered 2021-02-13: 1000 mg via INTRAVENOUS

## 2021-02-13 MED ORDER — LIDOCAINE HCL (CARDIAC) PF 100 MG/5ML IV SOSY
PREFILLED_SYRINGE | INTRAVENOUS | Status: DC | PRN
  Administered 2021-02-13: 50 mg via INTRAVENOUS

## 2021-02-13 MED ORDER — 0.9 % SODIUM CHLORIDE (POUR BTL) OPTIME
TOPICAL | Status: DC | PRN
  Administered 2021-02-13: 500 mL

## 2021-02-13 MED ORDER — SODIUM CHLORIDE 0.9 % IV SOLN: Freq: Once | INTRAVENOUS | Status: AC

## 2021-02-13 MED ORDER — TIROFIBAN HCL IN NACL 5-0.9 MG/100ML-% IV SOLN
0.0750 ug/kg/min | INTRAVENOUS | Status: DC
  Filled 2021-02-13: qty 100

## 2021-02-13 MED ORDER — PHENYLEPHRINE HCL (PRESSORS) 10 MG/ML IV SOLN
INTRAVENOUS | Status: DC | PRN
  Administered 2021-02-13: 160 ug via INTRAVENOUS

## 2021-02-13 MED ORDER — ACETAMINOPHEN 650 MG RE SUPP: 325.0000 mg | RECTAL | Status: DC | PRN

## 2021-02-13 MED ORDER — SODIUM CHLORIDE 0.9 % IV BOLUS
1000.0000 mL | Freq: Once | INTRAVENOUS | Status: AC
  Administered 2021-02-13: 1000 mL via INTRAVENOUS

## 2021-02-13 MED ORDER — FENTANYL CITRATE (PF) 100 MCG/2ML IJ SOLN
INTRAMUSCULAR | Status: DC | PRN
  Administered 2021-02-13 (×2): 25 ug via INTRAVENOUS

## 2021-02-13 MED ORDER — PROPOFOL 10 MG/ML IV BOLUS
INTRAVENOUS | Status: DC | PRN
  Administered 2021-02-13: 50 mg via INTRAVENOUS

## 2021-02-13 MED ORDER — ONDANSETRON HCL 4 MG/2ML IJ SOLN
INTRAMUSCULAR | Status: DC | PRN
  Administered 2021-02-13: 4 mg via INTRAVENOUS

## 2021-02-13 MED ORDER — METOPROLOL TARTRATE 5 MG/5ML IV SOLN: 2.0000 mg | INTRAVENOUS | Status: DC | PRN

## 2021-02-13 MED ORDER — ALUM & MAG HYDROXIDE-SIMETH 200-200-20 MG/5ML PO SUSP: 15.0000 mL | ORAL | Status: DC | PRN

## 2021-02-13 MED ORDER — PANTOPRAZOLE SODIUM 40 MG PO TBEC
40.0000 mg | DELAYED_RELEASE_TABLET | Freq: Every day | ORAL | Status: DC
  Administered 2021-02-14: 40 mg via ORAL
  Filled 2021-02-13: qty 1

## 2021-02-13 MED ORDER — CHLORHEXIDINE GLUCONATE CLOTH 2 % EX PADS
6.0000 | MEDICATED_PAD | Freq: Every day | CUTANEOUS | Status: DC
  Administered 2021-02-13 – 2021-02-19 (×6): 6 via TOPICAL

## 2021-02-13 MED ORDER — CEFAZOLIN SODIUM-DEXTROSE 2-4 GM/100ML-% IV SOLN
2.0000 g | Freq: Three times a day (TID) | INTRAVENOUS | Status: AC
  Administered 2021-02-13 (×2): 2 g via INTRAVENOUS
  Filled 2021-02-13 (×3): qty 100

## 2021-02-13 MED ORDER — DEXTROSE 50 % IV SOLN
INTRAVENOUS | Status: AC
  Administered 2021-02-13: 12.5 g via INTRAVENOUS
  Filled 2021-02-13: qty 50

## 2021-02-13 MED ORDER — SODIUM CHLORIDE FLUSH 0.9 % IV SOLN
INTRAVENOUS | Status: AC
  Filled 2021-02-13: qty 10

## 2021-02-13 MED ORDER — PHENYLEPHRINE HCL-NACL 20-0.9 MG/250ML-% IV SOLN
INTRAVENOUS | Status: DC | PRN
  Administered 2021-02-13: 20 ug/min via INTRAVENOUS

## 2021-02-13 MED ORDER — HYDRALAZINE HCL 20 MG/ML IJ SOLN: 5.0000 mg | INTRAMUSCULAR | Status: DC | PRN

## 2021-02-13 MED ORDER — LABETALOL HCL 5 MG/ML IV SOLN
10.0000 mg | INTRAVENOUS | Status: DC | PRN
Start: 2021-02-13 — End: 2021-02-14

## 2021-02-13 MED ORDER — PHENOL 1.4 % MT LIQD
1.0000 | OROMUCOSAL | Status: DC | PRN
  Filled 2021-02-13: qty 177

## 2021-02-13 MED ORDER — SUCCINYLCHOLINE CHLORIDE 200 MG/10ML IV SOSY
PREFILLED_SYRINGE | INTRAVENOUS | Status: DC | PRN
  Administered 2021-02-13: 50 mg via INTRAVENOUS

## 2021-02-13 MED ORDER — ONDANSETRON HCL 4 MG/2ML IJ SOLN
INTRAMUSCULAR | Status: AC
  Filled 2021-02-13: qty 2

## 2021-02-13 MED ORDER — MAGNESIUM SULFATE 2 GM/50ML IV SOLN
2.0000 g | Freq: Every day | INTRAVENOUS | Status: DC | PRN
  Filled 2021-02-13: qty 50

## 2021-02-13 MED ORDER — ONDANSETRON HCL 4 MG/2ML IJ SOLN
4.0000 mg | Freq: Four times a day (QID) | INTRAMUSCULAR | Status: DC | PRN
Start: 2021-02-13 — End: 2021-02-19
  Administered 2021-02-13: 4 mg via INTRAVENOUS
  Filled 2021-02-13 (×2): qty 2

## 2021-02-13 MED ORDER — TIROFIBAN HCL IN NACL 5-0.9 MG/100ML-% IV SOLN
0.0750 ug/kg/min | INTRAVENOUS | Status: DC
  Administered 2021-02-13: 0.075 ug/kg/min via INTRAVENOUS
  Filled 2021-02-13: qty 100

## 2021-02-13 MED ORDER — GUAIFENESIN-DM 100-10 MG/5ML PO SYRP: 15.0000 mL | ORAL_SOLUTION | ORAL | Status: DC | PRN

## 2021-02-13 MED ORDER — ACETAMINOPHEN 325 MG PO TABS: 325.0000 mg | ORAL_TABLET | ORAL | Status: DC | PRN

## 2021-02-13 MED ORDER — SODIUM CHLORIDE 0.9 % IV SOLN: Freq: Once | INTRAVENOUS | Status: DC

## 2021-02-13 MED ORDER — NITROGLYCERIN 2 % TD OINT
0.5000 [in_us] | TOPICAL_OINTMENT | Freq: Once | TRANSDERMAL | Status: AC
  Administered 2021-02-13: 0.5 [in_us] via TOPICAL
  Filled 2021-02-13: qty 1

## 2021-02-13 MED ORDER — ACETAMINOPHEN 10 MG/ML IV SOLN
INTRAVENOUS | Status: AC
  Filled 2021-02-13: qty 100

## 2021-02-13 MED ORDER — FENTANYL CITRATE (PF) 100 MCG/2ML IJ SOLN
INTRAMUSCULAR | Status: AC
  Filled 2021-02-13: qty 2

## 2021-02-13 MED ORDER — PROPOFOL 10 MG/ML IV BOLUS
INTRAVENOUS | Status: AC
  Filled 2021-02-13: qty 20

## 2021-02-13 MED ORDER — DEXTROSE 50 % IV SOLN: 12.5000 g | INTRAVENOUS | Status: AC

## 2021-02-13 MED ORDER — SODIUM CHLORIDE 0.9 % IV SOLN: 500.0000 mL | Freq: Once | INTRAVENOUS | Status: DC | PRN

## 2021-02-13 MED ORDER — POTASSIUM CHLORIDE CRYS ER 20 MEQ PO TBCR: 20.0000 meq | EXTENDED_RELEASE_TABLET | Freq: Every day | ORAL | Status: DC | PRN

## 2021-02-13 MED ORDER — HEPARIN SODIUM (PORCINE) 5000 UNIT/ML IJ SOLN
5000.0000 [IU] | Freq: Three times a day (TID) | INTRAMUSCULAR | Status: DC
  Administered 2021-02-13 – 2021-02-16 (×9): 5000 [IU] via SUBCUTANEOUS
  Filled 2021-02-13 (×9): qty 1

## 2021-02-13 MED ORDER — HYDROMORPHONE HCL 1 MG/ML IJ SOLN: 0.2500 mg | INTRAMUSCULAR | Status: DC | PRN

## 2021-02-13 MED ORDER — ASPIRIN EC 81 MG PO TBEC
81.0000 mg | DELAYED_RELEASE_TABLET | Freq: Every day | ORAL | Status: DC
  Administered 2021-02-14: 81 mg via ORAL
  Filled 2021-02-13: qty 1

## 2021-02-13 MED ORDER — BUPIVACAINE-EPINEPHRINE (PF) 0.25% -1:200000 IJ SOLN
INTRAMUSCULAR | Status: AC
  Filled 2021-02-13: qty 30

## 2021-02-13 MED ORDER — MORPHINE SULFATE (PF) 2 MG/ML IV SOLN
2.0000 mg | INTRAVENOUS | Status: DC | PRN
  Administered 2021-02-13 – 2021-02-14 (×4): 2 mg via INTRAVENOUS
  Filled 2021-02-13 (×4): qty 1

## 2021-02-13 MED ORDER — DOCUSATE SODIUM 100 MG PO CAPS
100.0000 mg | ORAL_CAPSULE | Freq: Every day | ORAL | Status: DC
  Administered 2021-02-14: 100 mg via ORAL
  Filled 2021-02-13: qty 1

## 2021-02-13 MED ORDER — FENTANYL CITRATE PF 50 MCG/ML IJ SOSY: 100.0000 ug | PREFILLED_SYRINGE | Freq: Once | INTRAMUSCULAR | Status: DC

## 2021-02-13 SURGICAL SUPPLY — 49 items
ADH LQ OCL WTPRF AMP STRL LF (MISCELLANEOUS) ×2
ADHESIVE MASTISOL STRL (MISCELLANEOUS) ×2 IMPLANT
APL PRP STRL LF DISP 70% ISPRP (MISCELLANEOUS) ×1
BLADE SURG SZ10 CARB STEEL (BLADE) ×2 IMPLANT
BNDG CMPR STD VLCR NS LF 5.8X6 (GAUZE/BANDAGES/DRESSINGS) ×1
BNDG COHESIVE 4X5 TAN ST LF (GAUZE/BANDAGES/DRESSINGS) ×2 IMPLANT
BNDG ELASTIC 6X5.8 VLCR NS LF (GAUZE/BANDAGES/DRESSINGS) ×2 IMPLANT
BNDG GAUZE ELAST 4 BULKY (GAUZE/BANDAGES/DRESSINGS) ×4 IMPLANT
CANISTER WOUND CARE 500ML ATS (WOUND CARE) ×3 IMPLANT
CHLORAPREP W/TINT 26 (MISCELLANEOUS) ×2 IMPLANT
CONNECTOR Y WND VAC (MISCELLANEOUS) IMPLANT
DRESSING SURGICEL FIBRLLR 1X2 (HEMOSTASIS) ×1 IMPLANT
DRSG GAUZE FLUFF 36X18 (GAUZE/BANDAGES/DRESSINGS) ×2 IMPLANT
DRSG SURGICEL FIBRILLAR 1X2 (HEMOSTASIS) ×2
DRSG TEGADERM 2X2.25 PEDS (GAUZE/BANDAGES/DRESSINGS) ×1 IMPLANT
DRSG TEGADERM 4X4.75 (GAUZE/BANDAGES/DRESSINGS) ×1 IMPLANT
DRSG TELFA 3X8 NADH (GAUZE/BANDAGES/DRESSINGS) ×4 IMPLANT
DRSG VAC ATS MED SENSATRAC (GAUZE/BANDAGES/DRESSINGS) ×3 IMPLANT
ELECT CAUTERY BLADE 6.4 (BLADE) ×2 IMPLANT
ELECT REM PT RETURN 9FT ADLT (ELECTROSURGICAL) ×2
ELECTRODE REM PT RTRN 9FT ADLT (ELECTROSURGICAL) ×1 IMPLANT
GAUZE 4X4 16PLY ~~LOC~~+RFID DBL (SPONGE) ×2 IMPLANT
GAUZE XEROFORM 1X8 LF (GAUZE/BANDAGES/DRESSINGS) ×3 IMPLANT
GLOVE SURG ENC MOIS LTX SZ7 (GLOVE) ×2 IMPLANT
GLOVE SURG SYN 7.0 (GLOVE) ×2 IMPLANT
GLOVE SURG SYN 7.0 PF PI (GLOVE) ×1 IMPLANT
GLOVE SURG UNDER LTX SZ7.5 (GLOVE) ×2 IMPLANT
GOWN STRL REUS W/ TWL LRG LVL3 (GOWN DISPOSABLE) ×1 IMPLANT
GOWN STRL REUS W/ TWL XL LVL3 (GOWN DISPOSABLE) ×2 IMPLANT
GOWN STRL REUS W/TWL LRG LVL3 (GOWN DISPOSABLE) ×2
GOWN STRL REUS W/TWL XL LVL3 (GOWN DISPOSABLE) ×4
HANDLE YANKAUER SUCT BULB TIP (MISCELLANEOUS) ×2 IMPLANT
KIT TURNOVER KIT A (KITS) ×2 IMPLANT
MANIFOLD NEPTUNE II (INSTRUMENTS) ×2 IMPLANT
NS IRRIG 500ML POUR BTL (IV SOLUTION) ×2 IMPLANT
PACK EXTREMITY ARMC (MISCELLANEOUS) ×2 IMPLANT
PAD DRESSING TELFA 3X8 NADH (GAUZE/BANDAGES/DRESSINGS) IMPLANT
PAD NEG PRESSURE SENSATRAC (MISCELLANEOUS) ×1 IMPLANT
PAD PREP 24X41 OB/GYN DISP (PERSONAL CARE ITEMS) ×2 IMPLANT
SHEARS HARMONIC 9CM CVD (BLADE) ×1 IMPLANT
SPONGE GAUZE 2X2 8PLY STRL LF (GAUZE/BANDAGES/DRESSINGS) ×1 IMPLANT
SPONGE T-LAP 18X18 ~~LOC~~+RFID (SPONGE) ×4 IMPLANT
STAPLER SKIN PROX 35W (STAPLE) ×2 IMPLANT
STOCKINETTE M/LG 89821 (MISCELLANEOUS) ×2 IMPLANT
SUT ETHIBOND 0 36 GRN (SUTURE) ×2 IMPLANT
SUT SILK 0 (SUTURE) ×2
SUT SILK 0 30XBRD TIE 6 (SUTURE) IMPLANT
WATER STERILE IRR 500ML POUR (IV SOLUTION) ×2 IMPLANT
WND VAC CONN Y (MISCELLANEOUS) ×1

## 2021-02-13 NOTE — Progress Notes (Signed)
Patient ID: [name], female   DOB: 12-15-1925, 87 y.o.   MRN: [id number] [facility] [place] NAME: [name]    MR#:  [id number]  DATE OF BIRTH:  03-Jun-1925  SUBJECTIVE:  patient came in from her long-term facility with right leg pain. Found to have acute right ischemic limb underwent angiogram and anterior compartment fascia [place] this morning. Currently in the ICU. Under sedation post-anesthesia and pain meds. Unable to have a meaningful conversation. No family at bedside. Spoke with sister [name] on the phone  REVIEW OF SYSTEMS:   Review of Systems  Unable to perform ROS: Mental status change  Tolerating Diet: Tolerating PT:   DRUG ALLERGIES:   Allergies  Allergen Reactions   Codeine Itching and Nausea Only    Small amounts okay   Pregabalin Other (See Comments)    Confusion and hallucinations   Promethazine Hcl Other (See Comments)    Muscle cramps   Sulfonamide Derivatives Nausea Only    VITALS:  Blood pressure (!) 108/52, pulse 76, temperature (!) 97 F (36.1 C), temperature source Axillary, resp. rate 10, weight 53.4 kg, SpO2 91 %.  PHYSICAL EXAMINATION:   Physical Examlimited  GENERAL:  87 y.o.-year-old patient lying in the bed with no acute distress.  LUNGS: Normal breath sounds bilaterally, no wheezing, rales, rhonchi. No use of accessory muscles of respiration.  CARDIOVASCULAR: S1, S2 normal. No murmurs, rubs, or gallops.  ABDOMEN: Soft, nontender, nondistended. Bowel sounds present. No organomegaly or mass.  EXTREMITIES: right LE surgical dressing+ left foot no pedal pule NEUROLOGIC: unable to assess. Sedation and pain med  PSYCHIATRIC sedated   LABORATORY PANEL:  CBC Recent Labs  Lab 02/13/21 0451  WBC 25.9*  HGB 11.4*  HCT 36.0  PLT 239    Chemistries  Recent Labs  Lab 02/12/21 0744 02/12/21 1847 02/13/21 0451  NA 141   < > 140  K 4.3   < > 5.1  CL 107   < > 111  CO2 19*   < >  19*  GLUCOSE 146*   < > 120*  BUN 24*   < > 24*  CREATININE 0.92   < > 1.03*  CALCIUM 10.2   < > 8.8*  AST 96*  --   --   ALT 23  --   --   ALKPHOS 53  --   --   BILITOT 1.1  --   --    < > = values in this interval not displayed.   Cardiac Enzymes No results for input(s): TROPONINI in the last 168 hours. RADIOLOGY:  CT HEAD WO CONTRAST (5MM)  Result Date: 02/13/2021 CLINICAL DATA:  Mental status changes EXAM: CT HEAD WITHOUT CONTRAST TECHNIQUE: Contiguous axial images were obtained from the base of the skull through the vertex without intravenous contrast. COMPARISON:  Recent CT head 01/20/2021 FINDINGS: Brain: No evidence of acute infarction, hemorrhage, hydrocephalus, extra-axial collection or mass lesion/mass effect. Stable cortical and central atrophy with ex vacuo ventriculomegaly. Vascular: No hyperdense vessel or unexpected calcification. Skull: Normal. Negative for fracture or focal lesion. Sinuses/Orbits: No acute finding. Other: None. IMPRESSION: No acute intracranial abnormality. Electronically Signed   By: Jacqulynn Cadet M.D.   On: 02/13/2021 07:33   PERIPHERAL VASCULAR CATHETERIZATION  Result Date: 02/12/2021 See surgical note for result.  PERIPHERAL VASCULAR CATHETERIZATION  Result Date: 02/12/2021 See surgical note for result.  DG Hip Unilat W or Wo Pelvis 2-3 Views Right  Result Date: 02/11/2021 CLINICAL DATA:  No acute bony abnormality. EXAM: DG HIP (WITH OR WITHOUT PELVIS) 2-3V RIGHT COMPARISON:  None. FINDINGS: 11/01/2018 IMPRESSION: Bilateral hip replacements. No hardware complicating feature. No acute bony abnormality. Specifically, no fracture, subluxation, or dislocation. Electronically Signed   By: Rolm Baptise M.D.   On: 02/11/2021 17:17   ASSESSMENT AND PLAN:  HPI: [name] is a 87 y.o. female with medical history significant of hyperlipidemia, COPD, GERD, depression, urinary incontinence, small bowel obstruction, memory loss, bradycardia, UTI,  who presents with right foot and left lower leg pain.  Per ED physician, pt was seen in ED due to right hip pain. She had x-ray of right hip which showed bilateral hip replacement, but was negative for acute issues. After went home, she developed severe pain in right foot and  right lower extremity.  Her sensation in right foot is decreased.  Patient was found to have cool, mottled and dusky right foot and right lower leg. No DP or PT pulse is palpated or dopplered in ED per EDP.   Ischemia of right lower extremity: pt has patient has critical ischemic limb in right lower extremity.  --10/21-- IV heparin was started in ED.   --Dr. Lucky Cowboy of [facility] is consulted s/p  Catheter directed thrombolytic therapy with 6 mg of tPA to the right common femoral artery and superficial femoral artery *Mechanical thrombectomy of the right external iliac artery, common femoral artery, superficial femoral artery, popliteal artery, tibioperoneal trunk, and profunda femoris artery  *Percutaneous transluminal angioplasty of the right SFA,right external iliac artery and most proximal common femoral artery and  Placement of infusion catheter continued thrombolytic therapy in both iliac arteries and the distal aorta across the aortic bifurcation with a 90 cm total length 20 cm working length catheter --10/22-- patient underwent urgent right lower extremity anterior compartment fascia out to me. Surgery went well according to dr Baruch Gouty. There was no necrosis of muscle. IV Aggrastat to be started today. -- Patient does not have left lower extremity pulses. Vascular seems patient is too high risk for any of the procedure given comorbidities and age. This was discussed at length with patient's sister [name] and her husband over the phone they do understand patient's overall condition. -- Palliative care consulted. -- PRN IV pain meds   COPD (chronic obstructive pulmonary disease) ([place]): stable -As needed albuterol    GERD -Protonix   Leukocytosis: WBC 15.7, no source of infection identified, likely reactive -Follow-up with CBC   acute on CKD (chronic kidney disease), stage IIIa: Stable.  Creatinine 0.92, BUN 24 -Follow-up with BMP -- patient with dark urine suspicion for myoglobin urea given compartment syndrome. -- Continue IV fluids -- nephrology consultation appreciated   HLD (hyperlipidemia): Patient is not taking statin    Acute metabolic encephalopathy/?cognitive decline at basline  Not sure about baseline mental status.  Since patient is receiving heparin and thrombolysis treatment, will get CT head --CT of head-- unremarkable      Procedures: right lower extremity angiogram, right anterior compartment fascia alchemy Family communication : sister [name] and her husband on the phone Consults : vascular, nephrology CODE STATUS: DNR DVT Prophylaxis : heparin Level of care: ICU Status is: Inpatient  Remains inpatient appropriate because: ongoing treatment for ischemic limb        TOTAL TIME TAKING CARE OF THIS PATIENT: 35 minutes.  >50% time spent on counselling and coordination of care  Note: This dictation was prepared with Dragon dictation along with  smaller phrase technology. Any transcriptional errors that result from this process are unintentional.  Fritzi Mandes M.D    [facility]   CC: Primary care physician; Pccm, Ander Gaster, MD

## 2021-02-13 NOTE — Transfer of Care (Signed)
Immediate Anesthesia Transfer of Care Note  Patient: [name]  Procedure(s) Performed: FASCIOTOMY Right lower extremity, all 4 compartments (Right: Leg Lower) APPLICATION OF WOUND VAC (Right: Leg Lower)  Patient Location: PACU  Anesthesia Type:General  Level of Consciousness: awake  Airway & Oxygen Therapy: Patient Spontanous Breathing and Patient connected to face mask oxygen  Post-op Assessment: Report given to RN and Post -op Vital signs reviewed and stable  Post vital signs: Reviewed and stable  Last Vitals:  Vitals Value Taken Time  BP 122/69 02/13/21 0813  Temp 36.2 C 02/13/21 0813  Pulse 70 02/13/21 0813  Resp 15 02/13/21 0813  SpO2 94 % 02/13/21 0813  Vitals shown include unvalidated device data.  Last Pain:  Vitals:   02/13/21 0813  TempSrc:   PainSc: 0-No pain         Complications: No notable events documented.

## 2021-02-13 NOTE — Anesthesia Procedure Notes (Signed)
Procedure Name: Intubation Date/Time: 02/13/2021 7:29 AM Performed by: Hedda Slade, CRNA Pre-anesthesia Checklist: Patient identified, Patient being monitored, Timeout performed, Emergency Drugs available and Suction available Patient Re-evaluated:Patient Re-evaluated prior to induction Oxygen Delivery Method: Circle system utilized Preoxygenation: Pre-oxygenation with 100% oxygen Induction Type: IV induction Ventilation: Mask ventilation without difficulty Laryngoscope Size: 3 and McGraph Grade View: Grade I Tube type: Oral Tube size: 7.0 mm Number of attempts: 1 Airway Equipment and Method: Stylet Placement Confirmation: ETT inserted through vocal cords under direct vision, positive ETCO2 and breath sounds checked- equal and bilateral Secured at: 21 cm Tube secured with: Tape Dental Injury: Teeth and Oropharynx as per pre-operative assessment

## 2021-02-13 NOTE — Consult Note (Signed)
[facility] CONSULT NOTE    Date: 02/13/2021                  Patient Name:  [name]  MRN: [id number]  DOB: Jan 14, 1926  Age / Sex: 87 y.o., female         PCP: Pccm, Armc-, MD                 Service Requesting Consult: ICU                  Reason for Consult: Acute kidney injury            History of Present Illness: Patient is a 87 y.o. female with a PMHx of hypertension, coronary artery disease, hyperlipidemia, COPD, GERD, depression, history of small bowel obstruction, bradycardia, dementia now with admitted with history of right lower extremity pain.  She is found to have severe ischemia of the right lower extremity was found to have a compartment syndrome.  Patient had fasciotomy done.  Renal evaluation is requested for elevated creatinine.   Medications: Outpatient medications: Medications Prior to Admission  Medication Sig Dispense Refill Last Dose   beta carotene w/minerals (OCUVITE) tablet Take 1 tablet by mouth 2 (two) times daily.   Past Week   conjugated estrogens (PREMARIN) vaginal cream Place 1 Applicatorful vaginally daily. Use pea sized amount M-W-Fr before bedtime 42.5 g 12 Past Week   docusate sodium (COLACE) 100 MG capsule Take 1 capsule (100 mg total) by mouth 2 (two) times daily. 60 capsule 1 Past Week   famotidine (PEPCID) 20 MG tablet Take 20 mg by mouth at bedtime.   Past Week   lidocaine (LIDODERM) 5 % Place 1 patch onto the skin every 12 (twelve) hours. Remove & Discard patch within 12 hours or as directed by MD 10 patch 0 Past Week   morphine (MSIR) 15 MG tablet Take 15 mg by mouth 5 (five) times daily as needed.   02/12/2021 at 0600   omeprazole (PRILOSEC) 20 MG capsule Take 20 mg by mouth daily before breakfast.    Past Week   timolol (TIMOPTIC) 0.5 % ophthalmic solution timolol maleate 0.5 % eye drops   Past Week   dorzolamide-timolol (COSOPT) 22.3-6.8 MG/ML ophthalmic solution Place 1 drop into both eyes every  12 (twelve) hours. (Patient not taking: No sig reported)   Not Taking   latanoprost (XALATAN) 0.005 % ophthalmic solution Place 1 drop into both eyes at bedtime. (Patient not taking: No sig reported)   Not Taking    Current medications: Current Facility-Administered Medications  Medication Dose Route Frequency Provider Last Rate Last Admin   0.9 %  sodium chloride infusion   Intravenous Continuous Dew, Erskine Squibb, MD       0.9 %  sodium chloride infusion   Intravenous Continuous Lanora Manis, Emmanual Gauthreaux, MD 75 mL/hr at 02/13/21 1044 Rate Change at 02/13/21 1044   0.9 %  sodium chloride infusion  250 mL Intravenous PRN Algernon Huxley, MD       acetaminophen (OFIRMEV) 10 MG/ML IV            albuterol (PROVENTIL) (2.5 MG/3ML) 0.083% nebulizer solution 2.5 mg  2.5 mg Inhalation Q4H PRN Algernon Huxley, MD       Chlorhexidine Gluconate Cloth 2 % PADS 6 each  6 each Topical Daily Elmore Guise, MD   6 each at 02/13/21 0921   docusate sodium (COLACE) capsule 100 mg  100 mg Oral BID  PRN Algernon Huxley, MD       fentaNYL (SUBLIMAZE) injection 100 mcg  100 mcg Intravenous Once Elmore Guise, MD       fentaNYL (SUBLIMAZE) injection 12.5 mcg  12.5 mcg Intravenous Q3H PRN Algernon Huxley, MD   12.5 mcg at 02/12/21 2251   HYDROmorphone (DILAUDID) injection 1 mg  1 mg Intravenous Q2H PRN Algernon Huxley, MD   1 mg at 02/12/21 1523   morphine 2 MG/ML injection 2 mg  2 mg Intravenous Q2H PRN Fritzi Mandes, MD       oxyCODONE (Oxy IR/ROXICODONE) immediate release tablet 5-10 mg  5-10 mg Oral Once Algernon Huxley, MD       sodium chloride flush (NS) 0.9 % injection 3 mL  3 mL Intravenous Q12H Algernon Huxley, MD   3 mL at 02/13/21 5366   sodium chloride flush (NS) 0.9 % injection 3 mL  3 mL Intravenous PRN Algernon Huxley, MD       timolol (TIMOPTIC) 0.5 % ophthalmic solution 1 drop  1 drop Both Eyes Daily Algernon Huxley, MD   1 drop at 02/12/21 2127   tirofiban (AGGRASTAT) infusion 50 mcg/mL 100 mL  0.075 mcg/kg/min Intravenous Continuous  Dallie Piles, RPH          Allergies: Allergies  Allergen Reactions   Codeine Itching and Nausea Only    Small amounts okay   Pregabalin Other (See Comments)    Confusion and hallucinations   Promethazine Hcl Other (See Comments)    Muscle cramps   Sulfonamide Derivatives Nausea Only      Past Medical History: Past Medical History:  Diagnosis Date   Adenomatous colon polyp 04/26/95   tubulovillous   Allergy    Arthritis    Bradycardia    Bursitis    Cataracts, bilateral    Constipation    COPD, moderate ([place])    "THIS WAS TOLD TO ME BACK IN THE DAYS WHEN EVERYONE THAT WAS  A SMOKER WAS TOLD THEY WERE A COPD. IM 90 AND I DONT GET SHORT OF BREATH , I DONT HAVE IT "   Cystitis    Degenerative joint disease    Depression    DENIES    Gastritis    GERD (gastroesophageal reflux disease)    Glaucoma    Hypoglycemia    Impaired memory    Insomnia    Lack of bladder control    Macular degeneration    OA (osteoarthritis)    Osteopenia    Renal cyst    right   Shortness of breath    Small bowel obstruction (HCC)    Trigeminal neuralgia    DENIES    Urinary incontinence    Wears dentures      Past Surgical History: Past Surgical History:  Procedure Laterality Date   ABDOMINAL HYSTERECTOMY  1992   nonmalignant reasons   BACK SURGERY  05/20/08   LUMBAR SPINAL FUSION    BREAST SURGERY     EXCISION OF CALCIUM DEPOSIT    CATARACT EXTRACTION Bilateral    CHOLECYSTECTOMY  1994   DILATION AND CURETTAGE OF UTERUS     EYE SURGERY     RIGHT EYE HEMORRHAGE SURGERY    JOINT REPLACEMENT  2005   LEFT HIP    PARTIAL COLECTOMY  04/26/95   tubulovillous adenoma   SHOULDER SURGERY  01/22/2009   left   TONSILLECTOMY     TOTAL HIP ARTHROPLASTY Right 10/04/2018  Procedure: TOTAL HIP ARTHROPLASTY ANTERIOR APPROACH;  Surgeon: Rod Can, MD;  Location: [facility];  Service: Orthopedics;  Laterality: Right;   TOTAL SHOULDER ARTHROPLASTY Right 07/05/2012   Procedure: RIGHT  TOTAL SHOULDER ARTHROPLASTY;  Surgeon: Marin Shutter, MD;  Location: [facility];  Service: Orthopedics;  Laterality: Right;  Right total shoulder arthroplasty     Family History: Family History  Problem Relation Age of Onset   Cancer Father        colon   Heart disease Sister    Colon polyps Brother    Diabetes Brother      Social History: Social History   Socioeconomic History   Marital status: Widowed    Spouse name: Not on file   Number of children: Not on file   Years of education: Not on file   Highest education level: Not on file  Occupational History   Not on file  Tobacco Use   Smoking status: Former    Packs/day: 1.00    Years: 50.00    Pack years: 50.00    Types: Cigarettes    Quit date: 06/28/2005    Years since quitting: 15.6   Smokeless tobacco: Never  Substance and Sexual Activity   Alcohol use: No   Drug use: No   Sexual activity: Never  Other Topics Concern   Not on file  Social History Narrative   Childbirth x 2   Retired Therapist, sports         Social Determinants of Radio broadcast assistant Strain: Not on file  Food Insecurity: Not on file  Transportation Needs: Not on file  Physical Activity: Not on file  Stress: Not on file  Social Connections: Not on file  Intimate Partner Violence: Not on file     Review of Systems: As per HPI  Vital Signs: Blood pressure 122/71, pulse 64, temperature (!) 97 F (36.1 C), temperature source Axillary, resp. rate 13, weight 53.4 kg, SpO2 96 %.  Weight trends: Filed Weights   02/12/21 1817  Weight: 53.4 kg    Physical Exam: Physical Exam: General:  No acute distress  Head:  Normocephalic, atraumatic. Moist oral mucosal membranes  Eyes:  Anicteric  Neck:  Supple  Lungs:   Clear to auscultation, normal effort  Heart:  S1S2 no rubs  Abdomen:   Soft, nontender, bowel sounds present  Extremities:  peripheral edema.  Neurologic:  Awake, alert, following commands  Skin:  No lesions  Access:     Lab  results:  Basic Metabolic Panel: Recent Labs  Lab 02/12/21 0744 02/12/21 1847 02/13/21 0451  NA 141 141 140  K 4.3 3.3* 5.1  CL 107 118* 111  CO2 19* 16* 19*  GLUCOSE 146* 121* 120*  BUN 24* 16 24*  CREATININE 0.92 0.67 1.03*  CALCIUM 10.2 6.4* 8.8*    CBC: Recent Labs  Lab 02/12/21 0744 02/12/21 1847 02/12/21 2340 02/13/21 0451  WBC 15.7* 20.0* 28.6* 25.9*  HGB 13.8 8.8* 11.7* 11.4*  HCT 40.0 27.2* 35.1* 36.0  MCV 90.1 95.8 93.6 94.2  PLT 282 172 227 239    Microbiology: Results for orders placed or performed during the hospital encounter of 02/12/21  Resp Panel by RT-PCR (Flu A&B, Covid) Nasopharyngeal Swab     Status: None   Collection Time: 02/12/21  7:46 AM   Specimen: Nasopharyngeal Swab; Nasopharyngeal(NP) swabs in vial transport medium  Result Value Ref Range Status   SARS Coronavirus 2 by RT PCR NEGATIVE NEGATIVE Final  Comment: (NOTE) SARS-CoV-2 target nucleic acids are NOT DETECTED.  The SARS-CoV-2 RNA is generally detectable in upper respiratory specimens during the acute phase of infection. The lowest concentration of SARS-CoV-2 viral copies this assay can detect is 138 copies/mL. A negative result does not preclude SARS-Cov-2 infection and should not be used as the sole basis for treatment or other patient management decisions. A negative result may occur with  improper specimen collection/handling, submission of specimen other than nasopharyngeal swab, presence of viral mutation(s) within the areas targeted by this assay, and inadequate number of viral copies(<138 copies/mL). A negative result must be combined with clinical observations, patient history, and epidemiological information. The expected result is Negative.  Fact Sheet for Patients:  EntrepreneurPulse.com.au  Fact Sheet for Healthcare Providers:  IncredibleEmployment.be  This test is no t yet approved or cleared by the [place] FDA and   has been authorized for detection and/or diagnosis of SARS-CoV-2 by FDA under an Emergency Use Authorization (EUA). This EUA will remain  in effect (meaning this test can be used) for the duration of the COVID-19 declaration under Section 564(b)(1) of the Act, 21 U.S.C.section 360bbb-3(b)(1), unless the authorization is terminated  or revoked sooner.       Influenza A by PCR NEGATIVE NEGATIVE Final   Influenza B by PCR NEGATIVE NEGATIVE Final    Comment: (NOTE) The Xpert Xpress SARS-CoV-2/FLU/RSV plus assay is intended as an aid in the diagnosis of influenza from Nasopharyngeal swab specimens and should not be used as a sole basis for treatment. Nasal washings and aspirates are unacceptable for Xpert Xpress SARS-CoV-2/FLU/RSV testing.  Fact Sheet for Patients: EntrepreneurPulse.com.au  Fact Sheet for Healthcare Providers: IncredibleEmployment.be  This test is not yet approved or cleared by the [place] FDA and has been authorized for detection and/or diagnosis of SARS-CoV-2 by FDA under an Emergency Use Authorization (EUA). This EUA will remain in effect (meaning this test can be used) for the duration of the COVID-19 declaration under Section 564(b)(1) of the Act, 21 U.S.C. section 360bbb-3(b)(1), unless the authorization is terminated or revoked.  Performed at [facility], [address] [place], [place] [address]   MRSA Next Gen by PCR, Nasal     Status: None   Collection Time: 02/12/21  6:27 PM   Specimen: Nasal Mucosa; Nasal Swab  Result Value Ref Range Status   MRSA by PCR Next Gen NOT DETECTED NOT DETECTED Final    Comment: (NOTE) The GeneXpert MRSA Assay (FDA approved for NASAL specimens only), is one component of a comprehensive MRSA colonization surveillance program. It is not intended to diagnose MRSA infection nor to guide or monitor treatment for MRSA infections. Test performance is not FDA approved in  patients less than 40 years old. Performed at [address] [facility], [address] [place],  [address]     Urinalysis: Recent Labs    02/12/21 1352  COLORURINE AMBER*  LABSPEC >1.046*  PHURINE 5.0  GLUCOSEU NEGATIVE  HGBUR LARGE*  BILIRUBINUR NEGATIVE  KETONESUR 5*  PROTEINUR 100*  NITRITE NEGATIVE  LEUKOCYTESUR NEGATIVE     Imaging:  CT HEAD WO CONTRAST (5MM)  Result Date: 02/13/2021 CLINICAL DATA:  Mental status changes EXAM: CT HEAD WITHOUT CONTRAST TECHNIQUE: Contiguous axial images were obtained from the base of the skull through the vertex without intravenous contrast. COMPARISON:  Recent CT head 01/20/2021 FINDINGS: Brain: No evidence of acute infarction, hemorrhage, hydrocephalus, extra-axial collection or mass lesion/mass effect. Stable cortical and central atrophy with ex vacuo ventriculomegaly.  Vascular: No hyperdense vessel or unexpected calcification. Skull: Normal. Negative for fracture or focal lesion. Sinuses/Orbits: No acute finding. Other: None. IMPRESSION: No acute intracranial abnormality. Electronically Signed   By: Jacqulynn Cadet M.D.   On: 02/13/2021 07:33   PERIPHERAL VASCULAR CATHETERIZATION  Result Date: 02/12/2021 See surgical note for result.  PERIPHERAL VASCULAR CATHETERIZATION  Result Date: 02/12/2021 See surgical note for result.  DG Hip Unilat W or Wo Pelvis 2-3 Views Right  Result Date: 02/11/2021 CLINICAL DATA:  No acute bony abnormality. EXAM: DG HIP (WITH OR WITHOUT PELVIS) 2-3V RIGHT COMPARISON:  None. FINDINGS: 11/01/2018 IMPRESSION: Bilateral hip replacements. No hardware complicating feature. No acute bony abnormality. Specifically, no fracture, subluxation, or dislocation. Electronically Signed   By: Rolm Baptise M.D.   On: 02/11/2021 17:17     Assessment & Plan:   Principal Problem:   Ischemia of right lower extremity Active Problems:   COPD (chronic obstructive pulmonary disease) (HCC)   GERD   Leukocytosis    CKD (chronic kidney disease), stage IIIa   HLD (hyperlipidemia)   Acute metabolic encephalopathy   Pressure injury of skin  Patient is a 87 y.o. female with a PMHx of hypertension, coronary artery disease, hyperlipidemia, COPD, GERD, depression, history of small bowel obstruction, bradycardia, dementia now with admitted with history of right lower extremity pain.  She is found to have severe ischemia of the right lower extremity was found to have a compartment syndrome.  Patient had fasciotomy done.  Renal evaluation is requested for elevated creatinine.  #1: Acute kidney injury: Acute kidney injury is most likely due to prerenal azotemia complicated by possible ATN secondary to myoglobin urea.  We will check CPK levels today.  I would like to give 1 L of IV isotonic saline followed by 75 cc an hour.  We will continue to monitor urine output closely.  #2: Compartment syndrome of right lower extremity: S/p fasciotomy.  Vascular note appreciated. Continue pain medications as ordered.  I will continue to follow the patient closely during the hospitalization and give recommendations as required.   LOS: [place], MD [facility]. 10/22/202210:59 AM

## 2021-02-13 NOTE — Procedures (Signed)
Procedures This is a 87 year old lady with recent right lower extremity revascularization for profound acute limb ischemia Rutherford classification 2B.  Patient developed pain and tightening of her anterior compartment of her right lower extremity and was deemed emergent and taken to the operating room.  Preoperative diagnoses: right lower extremity compartment syndrome Postoperative diagnosis: Same  Surgeon Dr. Feliberto Gottron Procedure: Four-compartment fasciotomy Description of procedure Timeouts were performed using both preinduction and preincision safety checklist to verify correct patient, procedure, site, and additional critical information prior to beginning the procedure.  The procedure was performed under general anesthesia.  The patient was placed supine.  The right lower extremity was prepped and draped in the usual sterile fashion.  A long incision was then performed over the lateral aspect of the leg 3 cm lateral and parallel to the tibia.  Incision was deepened through the subcutaneous tissues until the fascia was identified with the harmonic scalpel.  Skin flaps were created on either side of the incision to extend over the anterior and lateral compartments.  The fascia overlying the anterior compartment was then incised, and the incision was carried along the entire length of the skin incision.  The same was performed over the lateral compartment.  The underlying muscles were released and appeared to bulge through the fascial incisions.  Attention was then turned to the medial compartment, a medial skin incision was then performed and a few centimeters below the knee and extended down to 5 cm above the ankle.  The incision was deepened down to the subcutaneous tissues with the harmonic scalpel until the fascia was identified.  The superficial fascia was then divided, releasing the superficial posterior compartment.  The soleus muscles were then incised with harmonic scalpel until its  posterior fascia was identified.  The posterior fascia was then incised with the harmonic scalpel along most of the length of the incision, freeing the deep posterior compartment.  Thorough irrigation was then performed and hemostasis was secured with the harmonic scalpel.  The wounds were then covered with a VAC dressing.  A debriefing checklist was completed to share information critical to postoperative care of the patient.  The patient was taken to the postanesthetic care unit in satisfactory and stable condition.  Estimated blood loss: Minimal Findings: Significant anterior compartment syndrome.

## 2021-02-13 NOTE — Anesthesia Postprocedure Evaluation (Signed)
Anesthesia Post Note  Patient: [name]  Procedure(s) Performed: FASCIOTOMY Right lower extremity, all 4 compartments (Right: Leg Lower) APPLICATION OF WOUND VAC (Right: Leg Lower)  Patient location during evaluation: PACU Anesthesia Type: General Level of consciousness: awake and confused (returned to pre-op orientation which is altered from baseline) Pain management: pain level controlled Vital Signs Assessment: post-procedure vital signs reviewed and stable Respiratory status: spontaneous breathing, nonlabored ventilation, respiratory function stable and patient connected to nasal cannula oxygen Cardiovascular status: blood pressure returned to baseline and stable Postop Assessment: no apparent nausea or vomiting Anesthetic complications: no   No notable events documented.   Last Vitals:  Vitals:   02/13/21 0930 02/13/21 0945  BP: 122/62 122/87  Pulse:    Resp: 11 19  Temp:    SpO2:      Last Pain:  Vitals:   02/13/21 0915  TempSrc: Axillary  PainSc:                  [place] Ouch

## 2021-02-13 NOTE — Progress Notes (Signed)
OT Cancellation Note  Patient Details Name: [name] MRN: [id number] DOB: 1925/11/02   Cancelled Treatment:    Reason Eval/Treat Not Completed: Pain limiting ability to participate OT consult received and chart reviewed. Per RN, pt with c/o severe pain at this time and RN requests hold until better controlled. Will f/u for OT evaluation at later date/time as able. Thank you.  Gerrianne Scale, [place], OTR/L ascom [phone] 02/13/21, 2:51 PM

## 2021-02-13 NOTE — Progress Notes (Signed)
Clarified with pharmacy okay to give Heparin 5000 units with Aggrastat gtt.

## 2021-02-13 NOTE — Progress Notes (Signed)
PT Cancellation Note  Patient Details Name: GENEIVE SANDSTROM MRN: [id number] DOB: 1925/08/26   Cancelled Treatment:    Reason Eval/Treat Not Completed: Pain limiting ability to participate (Consult received and chart reviewed.  Per primary RN, patient experiencing significant pain; recommends hold on PT eval at this time. Will continue to follow and initiate as medically appropriate.)   Akram Kissick H. Owens Shark, PT, DPT, NCS 02/13/21, 1:31 PM [phone]

## 2021-02-13 NOTE — Anesthesia Preprocedure Evaluation (Signed)
Anesthesia Evaluation  Patient identified by MRN, date of birth, ID band Patient confused  General Assessment Comment:Awake but confused A&Ox1  Reviewed: Allergy & Precautions, NPO status , Patient's Chart, lab work & pertinent test results  Airway Mallampati: II  TM Distance: >3 FB Neck ROM: Full    Dental  (+) Poor Dentition, Missing, Dental Advisory Given   Pulmonary COPD, former smoker,  10/01/2018 Novel coronavirus NEG   breath sounds clear to auscultation       Cardiovascular (-) hypertension(-) angina+ Peripheral Vascular Disease   Rhythm:Regular Rate:Normal     Neuro/Psych Depression Trigeminal neuralgia chronic back pain: narcotics    GI/Hepatic GERD  Medicated and Controlled,(+)     substance abuse  , H/o SBO   Endo/Other  negative endocrine ROS  Renal/GU Renal disease (AKI)     Musculoskeletal  (+) Arthritis , narcotic dependent  Abdominal Normal abdominal exam  (+)   Peds  Hematology negative hematology ROS (+)   Anesthesia Other Findings ALI s/p angiography for PAD yesterday. Pt confused while receiving pain medications per nurse. Family states she is normally functional and oriented at baseline. She is HDS. She is on 2L [place].  Reproductive/Obstetrics                             Anesthesia Physical  Anesthesia Plan  ASA: 4 and emergent  Anesthesia Plan: General   Post-op Pain Management:    Induction: Intravenous  PONV Risk Score and Plan: 2 and Ondansetron, Treatment may vary due to age or medical condition and Dexamethasone  Airway Management Planned: Oral ETT  Additional Equipment:   Intra-op Plan:   Post-operative Plan:   Informed Consent: I have reviewed the patients History and Physical, chart, labs and discussed the procedure including the risks, benefits and alternatives for the proposed anesthesia with the patient or authorized representative who has  indicated his/her understanding and acceptance.    Discussed DNR with power of attorney.   Dental advisory given and Consent reviewed with POA  Plan Discussed with: CRNA, Surgeon and Anesthesiologist  Anesthesia Plan Comments: (Family okay with intubation and cardiac defibrillation/cardioversion but refuses chest compressions. They wish for her not be on long-term life support. )        Anesthesia Quick Evaluation

## 2021-02-13 NOTE — Progress Notes (Signed)
P1 Day Post-Op   Subjective/Chief Complaint: Patient followed through the night.  Doppler signals were assessed by nurse through the night. Motor function in the right leg still poor, but still has some sensation. Nurse reports swelling and tightness of the anterior aspect of the right leg, but calves are soft. Patient complaining of pain on compression of the right leg.  The left leg is unremarkable.   Objective: Vital signs in last 24 hours: Temp:  [98 F (36.7 C)-99.5 F (37.5 C)] 98.1 F (36.7 C) (10/22 0400) Pulse Rate:  [74-119] 75 (10/22 0600) Resp:  [12-26] 18 (10/22 0600) BP: (93-144)/(59-88) 131/75 (10/22 0600) SpO2:  [92 %-100 %] 93 % (10/22 0600) Weight:  [53.4 kg] 53.4 kg (10/21 1817)    Intake/Output from previous day: 10/21 0701 - 10/22 0700 In: -  Out: 400 [Urine:400] Intake/Output this shift: Total I/O In: -  Out: 200 [Urine:200]  General appearance: alert, appears stated age, and slowed mentation Head: Normocephalic, without obvious abnormality, atraumatic Resp: clear to auscultation bilaterally Chest wall: no tenderness Extremities: Right foot is warm Pulses: Right Pulses: FEM: doppler, POP: absent, DP: doppler, PT: doppler Left Pulses: FEM: doppler, POP: doppler, DP: doppler, PT: doppler Right foot is warm, dropped foot appearance, the anterior compartment seems tighter and the nurse states this is a change she noticed about 1 hour prior to my exam. Skin: Skin color, texture, turgor normal. No rashes or lesions or normal Neurologic: Mental status: alertness: obtunded Incision/Wound:  Lab Results:  Recent Labs    02/12/21 2340 02/13/21 0451  WBC 28.6* 25.9*  HGB 11.7* 11.4*  HCT 35.1* 36.0  PLT 227 239   BMET Recent Labs    02/12/21 1847 02/13/21 0451  NA 141 140  K 3.3* 5.1  CL 118* 111  CO2 16* 19*  GLUCOSE 121* 120*  BUN 16 24*  CREATININE 0.67 1.03*  CALCIUM 6.4* 8.8*   PT/INR Recent Labs    02/12/21 0744  LABPROT 14.6  INR  1.1   ABG No results for input(s): PHART, HCO3 in the last 72 hours.  Invalid input(s): PCO2, PO2  Studies/Results: PERIPHERAL VASCULAR CATHETERIZATION  Result Date: 02/12/2021 See surgical note for result.  PERIPHERAL VASCULAR CATHETERIZATION  Result Date: 02/12/2021 See surgical note for result.  DG Hip Unilat W or Wo Pelvis 2-3 Views Right  Result Date: 02/11/2021 CLINICAL DATA:  No acute bony abnormality. EXAM: DG HIP (WITH OR WITHOUT PELVIS) 2-3V RIGHT COMPARISON:  None. FINDINGS: 11/01/2018 IMPRESSION: Bilateral hip replacements. No hardware complicating feature. No acute bony abnormality. Specifically, no fracture, subluxation, or dislocation. Electronically Signed   By: Rolm Baptise M.D.   On: 02/11/2021 17:17    Anti-infectives: Anti-infectives (From admission, onward)    Start     Dose/Rate Route Frequency Ordered Stop   02/12/21 0847  ceFAZolin (ANCEF) IVPB 2g/100 mL premix        2 g 200 mL/hr over 30 Minutes Intravenous 30 min pre-op 02/12/21 0847 02/12/21 1830       Assessment/Plan: s/p Procedure(s): LOWER EXTREMITY INTERVENTION (Right) She will need to have four compartment fasciotomy in the OR.   Nephrology consult for ATN/ acute renal failure from myoglobinuria from reperfusion of the right lower limb. Discussed the poor prognosis with the sister, [name].  She understands her sister's condition and poor prognosis.    LOS: 1 day    Elmore Guise 02/13/2021

## 2021-02-14 ENCOUNTER — Encounter
Admission: EM | Disposition: A | Discharge status: Hospice | Payer: Self-pay | Source: Home / Self Care | Attending: Hospitalist

## 2021-02-14 ENCOUNTER — Encounter: Payer: Self-pay | Admitting: Surgery

## 2021-02-14 DIAGNOSIS — I998 Other disorder of circulatory system: Secondary | ICD-10-CM | POA: Diagnosis not present

## 2021-02-14 LAB — CBC
HCT: 24.8 % — ABNORMAL LOW (ref 36.0–46.0)
Hemoglobin: 8.2 g/dL — ABNORMAL LOW (ref 12.0–15.0)
MCH: 31.5 pg (ref 26.0–34.0)
MCHC: 33.1 g/dL (ref 30.0–36.0)
MCV: 95.4 fL (ref 80.0–100.0)
Platelets: 159 10*3/uL (ref 150–400)
RBC: 2.6 MIL/uL — ABNORMAL LOW (ref 3.87–5.11)
RDW: 14.2 % (ref 11.5–15.5)
WBC: 17 10*3/uL — ABNORMAL HIGH (ref 4.0–10.5)

## 2021-02-14 LAB — COMPREHENSIVE METABOLIC PANEL
ALT: 61 U/L — ABNORMAL HIGH (ref 0–44)
AST: 447 U/L — ABNORMAL HIGH (ref 15–41)
Albumin: 2.4 g/dL — ABNORMAL LOW (ref 3.5–5.0)
Alkaline Phosphatase: 45 U/L (ref 38–126)
Anion gap: 11 (ref 5–15)
BUN: 33 mg/dL — ABNORMAL HIGH (ref 8–23)
CO2: 17 mmol/L — ABNORMAL LOW (ref 22–32)
Calcium: 7.1 mg/dL — ABNORMAL LOW (ref 8.9–10.3)
Chloride: 112 mmol/L — ABNORMAL HIGH (ref 98–111)
Creatinine, Ser: 1.83 mg/dL — ABNORMAL HIGH (ref 0.44–1.00)
GFR, Estimated: 25 mL/min — ABNORMAL LOW (ref >60)
Glucose, Bld: 241 mg/dL — ABNORMAL HIGH (ref 70–99)
Potassium: 3.8 mmol/L (ref 3.5–5.1)
Sodium: 140 mmol/L (ref 135–145)
Total Bilirubin: 0.6 mg/dL (ref 0.3–1.2)
Total Protein: 5.2 g/dL — ABNORMAL LOW (ref 6.5–8.1)

## 2021-02-14 LAB — GLUCOSE, CAPILLARY
Glucose-Capillary: 100 mg/dL — ABNORMAL HIGH (ref 70–99)
Glucose-Capillary: 43 mg/dL — CL (ref 70–99)
Glucose-Capillary: 51 mg/dL — ABNORMAL LOW (ref 70–99)

## 2021-02-14 SURGERY — FASCIOTOMY CLOSURE
Anesthesia: Choice

## 2021-02-14 MED ORDER — ADULT MULTIVITAMIN W/MINERALS CH: 1.0000 | ORAL_TABLET | Freq: Two times a day (BID) | ORAL | Status: DC

## 2021-02-14 MED ORDER — LIDOCAINE-EPINEPHRINE 1 %-1:100000 IJ SOLN
20.0000 mL | Freq: Once | INTRAMUSCULAR | Status: DC
  Filled 2021-02-14: qty 20

## 2021-02-14 MED ORDER — FENTANYL CITRATE (PF) 100 MCG/2ML IJ SOLN
INTRAMUSCULAR | Status: AC
  Filled 2021-02-14: qty 2

## 2021-02-14 MED ORDER — DEXTROSE 50 % IV SOLN
INTRAVENOUS | Status: AC
  Administered 2021-02-14: 12.5 g via INTRAVENOUS
  Filled 2021-02-14: qty 50

## 2021-02-14 MED ORDER — FAMOTIDINE 20 MG PO TABS
20.0000 mg | ORAL_TABLET | Freq: Every day | ORAL | Status: DC
  Administered 2021-02-14: 20 mg via ORAL
  Filled 2021-02-14: qty 1

## 2021-02-14 MED ORDER — FUROSEMIDE 10 MG/ML IJ SOLN
20.0000 mg | Freq: Once | INTRAMUSCULAR | Status: AC
  Administered 2021-02-14: 20 mg via INTRAVENOUS
  Filled 2021-02-14: qty 2

## 2021-02-14 MED ORDER — MORPHINE 100MG IN NS 100ML (1MG/ML) PREMIX INFUSION
1.5000 mg/h | INTRAVENOUS | Status: DC
  Administered 2021-02-14: 1 mg/h via INTRAVENOUS
  Filled 2021-02-14: qty 100

## 2021-02-14 MED ORDER — PROPOFOL 500 MG/50ML IV EMUL
INTRAVENOUS | Status: AC
  Filled 2021-02-14: qty 50

## 2021-02-14 SURGICAL SUPPLY — 42 items
APL PRP STRL LF DISP 70% ISPRP (MISCELLANEOUS) ×1
BLADE SURG SZ10 CARB STEEL (BLADE) ×3 IMPLANT
BNDG CMPR STD VLCR NS LF 5.8X6 (GAUZE/BANDAGES/DRESSINGS) ×1
BNDG COHESIVE 4X5 TAN ST LF (GAUZE/BANDAGES/DRESSINGS) ×3 IMPLANT
BNDG ELASTIC 6X5.8 VLCR NS LF (GAUZE/BANDAGES/DRESSINGS) ×3 IMPLANT
BNDG GAUZE ELAST 4 BULKY (GAUZE/BANDAGES/DRESSINGS) ×6 IMPLANT
CANISTER WOUND CARE 500ML ATS (WOUND CARE) ×3 IMPLANT
CHLORAPREP W/TINT 26 (MISCELLANEOUS) ×3 IMPLANT
DRESSING SURGICEL FIBRLLR 1X2 (HEMOSTASIS) ×2 IMPLANT
DRSG GAUZE FLUFF 36X18 (GAUZE/BANDAGES/DRESSINGS) ×3 IMPLANT
DRSG SURGICEL FIBRILLAR 1X2 (HEMOSTASIS) ×2
DRSG VAC ATS MED SENSATRAC (GAUZE/BANDAGES/DRESSINGS) ×3 IMPLANT
ELECT CAUTERY BLADE 6.4 (BLADE) ×3 IMPLANT
ELECT REM PT RETURN 9FT ADLT (ELECTROSURGICAL) ×2
ELECTRODE REM PT RTRN 9FT ADLT (ELECTROSURGICAL) ×2 IMPLANT
GAUZE 4X4 16PLY ~~LOC~~+RFID DBL (SPONGE) ×3 IMPLANT
GAUZE XEROFORM 1X8 LF (GAUZE/BANDAGES/DRESSINGS) ×3 IMPLANT
GLOVE SURG ENC MOIS LTX SZ7 (GLOVE) ×3 IMPLANT
GLOVE SURG SYN 7.0 (GLOVE) ×2 IMPLANT
GLOVE SURG SYN 7.0 PF PI (GLOVE) ×1 IMPLANT
GLOVE SURG UNDER LTX SZ7.5 (GLOVE) ×3 IMPLANT
GOWN STRL REUS W/ TWL LRG LVL3 (GOWN DISPOSABLE) ×2 IMPLANT
GOWN STRL REUS W/ TWL XL LVL3 (GOWN DISPOSABLE) ×4 IMPLANT
GOWN STRL REUS W/TWL LRG LVL3 (GOWN DISPOSABLE) ×2
GOWN STRL REUS W/TWL XL LVL3 (GOWN DISPOSABLE) ×4
HANDLE YANKAUER SUCT BULB TIP (MISCELLANEOUS) ×3 IMPLANT
KIT TURNOVER KIT A (KITS) ×3 IMPLANT
MANIFOLD NEPTUNE II (INSTRUMENTS) ×3 IMPLANT
NS IRRIG 500ML POUR BTL (IV SOLUTION) ×3 IMPLANT
PACK EXTREMITY ARMC (MISCELLANEOUS) ×3 IMPLANT
PAD PREP 24X41 OB/GYN DISP (PERSONAL CARE ITEMS) ×3 IMPLANT
SPONGE T-LAP 18X18 ~~LOC~~+RFID (SPONGE) ×6 IMPLANT
STAPLER SKIN PROX 35W (STAPLE) ×3 IMPLANT
STOCKINETTE M/LG 89821 (MISCELLANEOUS) ×3 IMPLANT
SUT ETHIBOND 0 36 GRN (SUTURE) ×3 IMPLANT
SUT SILK 2 0 (SUTURE) ×2
SUT SILK 2-0 18XBRD TIE 12 (SUTURE) ×2 IMPLANT
SUT VIC AB 0 CT1 36 (SUTURE) ×15 IMPLANT
SUT VIC AB 3-0 SH 27 (SUTURE) ×8
SUT VIC AB 3-0 SH 27X BRD (SUTURE) ×8 IMPLANT
SUT VICRYL PLUS ABS 0 54 (SUTURE) ×3 IMPLANT
WATER STERILE IRR 500ML POUR (IV SOLUTION) ×3 IMPLANT

## 2021-02-14 NOTE — Progress Notes (Addendum)
Patient ID: [name], female   DOB: [birth year], 87 y.o.   MRN: [id number] [facility] [place] NAME: [name]    MR#:  [id number]  DATE OF BIRTH:  [birth year]  SUBJECTIVE:  patient came in from her long-term facility with right leg pain. Found to have acute right ischemic limb underwent angiogram and anterior compartment fascia   according to the ICU nurse patient is insignificant amount of pain. Continues to get PRN IV and PO pain meds quite frequently. Patient quite restless and uncomfortable. Sipping on water earlier. REVIEW OF SYSTEMS:   Review of Systems  Unable to perform ROS: Mental status change  Tolerating Diet: Tolerating PT:   DRUG ALLERGIES:   Allergies  Allergen Reactions   Codeine Itching and Nausea Only    Small amounts okay   Pregabalin Other (See Comments)    Confusion and hallucinations   Promethazine Hcl Other (See Comments)    Muscle cramps   Sulfonamide Derivatives Nausea Only    VITALS:  Blood pressure (!) 143/92, pulse 80, temperature 97.6 F (36.4 C), temperature source Oral, resp. rate 15, weight 53.4 kg, SpO2 97 %.  PHYSICAL EXAMINATION:   Physical Examlimited  GENERAL:  87 y.o.-year-old patient lying in the bed with no acute distress.  LUNGS: Normal breath sounds bilaterally, no wheezing, rales, rhonchi. No use of accessory muscles of respiration.  CARDIOVASCULAR: S1, S2 normal. No murmurs, rubs, or gallops.  ABDOMEN: Soft, nontender, nondistended. EXTREMITIES: right LE surgical dressing+ with wound VAC and left foot no pedal pulses NEUROLOGIC: unable to assess.  PSYCHIATRIC awake and restless   LABORATORY PANEL:  CBC Recent Labs  Lab 02/14/21 0526  WBC 17.0*  HGB 8.2*  HCT 24.8*  PLT 159     Chemistries  Recent Labs  Lab 02/14/21 0526  NA 140  K 3.8  CL 112*  CO2 17*  GLUCOSE 241*  BUN 33*  CREATININE 1.83*  CALCIUM 7.1*  AST 447*  ALT 61*  ALKPHOS 45  BILITOT  0.6    Cardiac Enzymes No results for input(s): TROPONINI in the last 168 hours. RADIOLOGY:  CT HEAD WO CONTRAST (5MM)  Result Date: 02/13/2021 CLINICAL DATA:  Mental status changes EXAM: CT HEAD WITHOUT CONTRAST TECHNIQUE: Contiguous axial images were obtained from the base of the skull through the vertex without intravenous contrast. COMPARISON:  Recent CT head 01/20/2021 FINDINGS: Brain: No evidence of acute infarction, hemorrhage, hydrocephalus, extra-axial collection or mass lesion/mass effect. Stable cortical and central atrophy with ex vacuo ventriculomegaly. Vascular: No hyperdense vessel or unexpected calcification. Skull: Normal. Negative for fracture or focal lesion. Sinuses/Orbits: No acute finding. Other: None. IMPRESSION: No acute intracranial abnormality. Electronically Signed   By: Jacqulynn Cadet M.D.   On: 02/13/2021 07:33   PERIPHERAL VASCULAR CATHETERIZATION  Result Date: 02/12/2021 See surgical note for result.  ASSESSMENT AND PLAN:  HPI: [name] is a 87 y.o. female with medical history significant of hyperlipidemia, COPD, GERD, depression, urinary incontinence, small bowel obstruction, memory loss, bradycardia, UTI, who presents with right foot and left lower leg pain.  Per ED physician, pt was seen in ED due to right hip pain. She had x-ray of right hip which showed bilateral hip replacement, but was negative for acute issues. After went home, she developed severe pain in right foot and  right lower extremity.  Her sensation in right foot is decreased.  Patient was found to have cool, mottled and dusky right  foot and right lower leg. No DP or PT pulse is palpated or dopplered in ED per EDP.   Ischemia of right lower extremity: pt has patient has critical ischemic limb in right lower extremity.  --10/21-- IV heparin was started in ED.   --Dr. Lucky Cowboy of [facility] is consulted s/p  Catheter directed thrombolytic therapy with 6 mg of tPA to the right common femoral artery  and superficial femoral artery *Mechanical thrombectomy of the right external iliac artery, common femoral artery, superficial femoral artery, popliteal artery, tibioperoneal trunk, and profunda femoris artery  *Percutaneous transluminal angioplasty of the right SFA,right external iliac artery and most proximal common femoral artery and  Placement of infusion catheter continued thrombolytic therapy in both iliac arteries and the distal aorta across the aortic bifurcation with a 90 cm total length 20 cm working length catheter --10/22-- patient underwent urgent right lower extremity anterior compartment fascia out to me. Surgery went well according to dr Baruch Gouty. There was no necrosis of muscle. IV Aggrastat to be started today. -- Patient does not have left lower extremity pulses. Vascular seems patient is too high risk for any of the procedure given comorbidities and age. This was discussed at length with patient's sister [name] and her husband over the phone they do understand patient's overall condition. -- Palliative care consulted. -- PRN IV pain meds --10/23-- patient is significant amount of pain and remains intermittently confused.  --Dr Baruch Gouty also discussed overall poor prognosis with patient given bilateral loss of pulses in both lower extremity -- Discussed at length with patient's sister Ms. [name] and few other family members and their goal is to keep patient comfort care and focus on her pain management. They are in agreement to start IV morphine drip if current IV and PO pain meds do not help her. Hospice discussion was made. -- TOC for hospice referral   COPD (chronic obstructive pulmonary disease) ([place]): stable -As needed albuterol   GERD -Protonix   Leukocytosis: WBC 15.7, no source of infection identified, likely reactive   acute on CKD (chronic kidney disease), stage IIIa: Stable.  Creatinine 0.92, BUN 24 -Follow-up with BMP -- patient with dark urine suspicion for  myoglobin urea given compartment syndrome. -- Continue IV fluids-- will DC now since patient is comfort care -- nephrology consultation appreciated   HLD (hyperlipidemia): Patient is not taking statin    Acute metabolic encephalopathy/?cognitive decline at basline  Not sure about baseline mental status.  Since patient is receiving heparin and thrombolysis treatment, will get CT head --CT of head-- unremarkable      Procedures: right lower extremity angiogram, right anterior compartment fascia alchemy Family communication : sister [name] and her husband and few family members in the ICU consults : vascular, nephrology CODE STATUS: DNR DVT Prophylaxis : heparin Level of care: Med-Surg Status is: Inpatient  transfer out of ICU to MedSurg floor       [place] THIS PATIENT: 35 minutes.  >50% time spent on counselling and coordination of care  Note: This dictation was prepared with Dragon dictation along with smaller phrase technology. Any transcriptional errors that result from this process are unintentional.  Fritzi Mandes M.D    [facility]   CC: Primary care physician; Pccm, Ander Gaster, MD

## 2021-02-14 NOTE — Progress Notes (Signed)
[facility]  PROGRESS NOTE   Subjective:   Patient seen in the ICU. Urine output is slightly better.  Objective:  Vital signs in last 24 hours:  Temp:  [97 F (36.1 C)-98.9 F (37.2 C)] 98.9 F (37.2 C) (10/23 0400) Pulse Rate:  [57-85] 80 (10/23 1100) Resp:  [10-25] 16 (10/23 1200) BP: (71-164)/(41-121) 105/75 (10/23 1200) SpO2:  [94 %-100 %] 97 % (10/23 1200)  Weight change:  Filed Weights   02/12/21 1817  Weight: 53.4 kg    Intake/Output: I/O last 3 completed shifts: In: 4608.3 [P.O.:60; I.V.:3917.7; IV Piggyback:630.5] Out: 652 [Urine:650; Blood:2]   Intake/Output this shift:  Total I/O In: 247.1 [I.V.:247.1] Out: 120 [Urine:120]  Physical Exam: General:  No acute distress  Head:  Normocephalic, atraumatic. Moist oral mucosal membranes  Eyes:  Anicteric  Neck:  Supple  Lungs:   Clear to auscultation, normal effort  Heart:  S1S2 no rubs  Abdomen:   Soft, nontender, bowel sounds present  Extremities: Cold and clammy extremities.  Neurologic:  Awake, alert, following commands  Skin:  No lesions  Access:     Basic Metabolic Panel: Recent Labs  Lab 02/12/21 0744 02/12/21 1847 02/13/21 0451 02/14/21 0526  NA 141 141 140 140  K 4.3 3.3* 5.1 3.8  CL 107 118* 111 112*  CO2 19* 16* 19* 17*  GLUCOSE 146* 121* 120* 241*  BUN 24* 16 24* 33*  CREATININE 0.92 0.67 1.03* 1.83*  CALCIUM 10.2 6.4* 8.8* 7.1*    CBC: Recent Labs  Lab 02/12/21 0744 02/12/21 1847 02/12/21 2340 02/13/21 0451 02/14/21 0526  WBC 15.7* 20.0* 28.6* 25.9* 17.0*  HGB 13.8 8.8* 11.7* 11.4* 8.2*  HCT 40.0 27.2* 35.1* 36.0 24.8*  MCV 90.1 95.8 93.6 94.2 95.4  PLT 282 172 227 239 159     Urinalysis: Recent Labs    02/12/21 1352  COLORURINE AMBER*  LABSPEC >1.046*  PHURINE 5.0  GLUCOSEU NEGATIVE  HGBUR LARGE*  BILIRUBINUR NEGATIVE  KETONESUR 5*  PROTEINUR 100*  NITRITE NEGATIVE  LEUKOCYTESUR NEGATIVE      Imaging: CT HEAD WO CONTRAST (5MM)  Result  Date: 02/13/2021 CLINICAL DATA:  Mental status changes EXAM: CT HEAD WITHOUT CONTRAST TECHNIQUE: Contiguous axial images were obtained from the base of the skull through the vertex without intravenous contrast. COMPARISON:  Recent CT head 01/20/2021 FINDINGS: Brain: No evidence of acute infarction, hemorrhage, hydrocephalus, extra-axial collection or mass lesion/mass effect. Stable cortical and central atrophy with ex vacuo ventriculomegaly. Vascular: No hyperdense vessel or unexpected calcification. Skull: Normal. Negative for fracture or focal lesion. Sinuses/Orbits: No acute finding. Other: None. IMPRESSION: No acute intracranial abnormality. Electronically Signed   By: Jacqulynn Cadet M.D.   On: 02/13/2021 07:33   PERIPHERAL VASCULAR CATHETERIZATION  Result Date: 02/12/2021 See surgical note for result.    Medications:    sodium chloride 75 mL/hr at 02/14/21 1100   sodium chloride     magnesium sulfate bolus IVPB     tirofiban 0.075 mcg/kg/min (02/14/21 1100)    aspirin EC  81 mg Oral Q0600   Chlorhexidine Gluconate Cloth  6 each Topical Daily   docusate sodium  100 mg Oral Daily   famotidine  20 mg Oral QHS   furosemide  20 mg Intravenous Once   heparin  5,000 Units Subcutaneous Q8H   lidocaine-EPINEPHrine  20 mL Intradermal Once   multivitamin with minerals  1 tablet Oral BID   oxyCODONE  5-10 mg Oral Once   pantoprazole  40 mg  Oral Daily   sodium chloride flush  3 mL Intravenous Q12H   timolol  1 drop Both Eyes Daily    Assessment/ Plan:     Principal Problem:   Ischemia of right lower extremity Active Problems:   COPD (chronic obstructive pulmonary disease) (HCC)   GERD   Leukocytosis   CKD (chronic kidney disease), stage IIIa   HLD (hyperlipidemia)   Acute metabolic encephalopathy   Pressure injury of skin   Compartment syndrome of foot, right, initial encounter [facility])  Patient is a 87 y.o. female with a PMHx of hypertension, coronary artery disease,  hyperlipidemia, COPD, GERD, depression, history of small bowel obstruction, bradycardia, dementia now with admitted with history of right lower extremity pain.  She is found to have severe ischemia of the right lower extremity was found to have a compartment syndrome.  Patient had fasciotomy done.  Renal evaluation is requested for elevated creatinine.   #1: Acute kidney injury: Patient is acute kidney injury most likely due to prerenal azotemia complicated by possible acute tubular necrosis.  ATN may be secondary to myoglobinuric renal failure.  We will continue with IV fluid resuscitation.  We will give a dose of Lasix today.   #2: Compartment syndrome of right lower extremity: S/p fasciotomy.  Vascular note appreciated. Continue pain medications as ordered.  Overall prognosis seems to be very poor.  Comfort care advised.   I will continue to follow the patient closely during the hospitalization and give recommendations as required.     LOS: [place], MD [facility] 10/23/202212:29 PM

## 2021-02-14 NOTE — Progress Notes (Signed)
OT Cancellation Note  Patient Details Name: [name] MRN: [id number] DOB: 25-Jan-1926   Cancelled Treatment:    Reason Eval/Treat Not Completed: Patient not medically ready. Per chart review, pt scheduled for fasciotomy closure today. OT to hold evaluation until procedure complete and pt is medically appropriate.   Fredirick Maudlin, OTR/L [place]

## 2021-02-14 NOTE — Progress Notes (Signed)
Verbal Order received from Elmore Guise to increase cont. Morphine drip from 1 to 1.5 mg/hr.

## 2021-02-14 NOTE — Anesthesia Preprocedure Evaluation (Signed)
Anesthesia Evaluation    Airway        Dental   Pulmonary COPD, former smoker (quit 2007),           Cardiovascular + Peripheral Vascular Disease (s/p RLE thrombectomy and subsequent fasciotomy for compartment syndrome)    ECG 02/12/21:  Sinus rhythm Ventricular premature complex Aberrant conduction of SV complex(es) Borderline right axis deviation Low voltage, extremity and precordial leads   Neuro/Psych PSYCHIATRIC DISORDERS Depression Chronic back pain  Neuromuscular disease (trigeminal neuralgia)    GI/Hepatic GERD  ,  Endo/Other    Renal/GU Renal disease (AKI on stage III CKD)     Musculoskeletal  (+) Arthritis ,   Abdominal   Peds  Hematology   Anesthesia Other Findings   Reproductive/Obstetrics                             Anesthesia Physical Anesthesia Plan  ASA: 4  Anesthesia Plan: General   Post-op Pain Management:    Induction: Intravenous  PONV Risk Score and Plan: 3 and Ondansetron, Dexamethasone and Treatment may vary due to age or medical condition  Airway Management Planned: Oral ETT  Additional Equipment:   Intra-op Plan:   Post-operative Plan: Extubation in OR  Informed Consent:   Patient has DNR.  Discussed DNR with power of attorney.     Plan Discussed with:   Anesthesia Plan Comments:         Anesthesia Quick Evaluation

## 2021-02-14 NOTE — Progress Notes (Signed)
Patient ID: [name], female   DOB: May 31, 1925, 87 y.o.   MRN: [id number] Per RN pt in significant amount of pain. Will start morphine gtt for comfort as discussed with family earleir

## 2021-02-14 NOTE — Progress Notes (Signed)
PT Cancellation Note  Patient Details Name: [name] MRN: [id number] DOB: 08/18/1925   Cancelled Treatment:    Reason Eval/Treat Not Completed: Medical issues which prohibited therapy (Per chart review, patient scheduled for fasciotomy closure today.  Will hold PT eval until procedure complete and patient appropriate for mobility assessment.)   Krishawna Stiefel H. Owens Shark, PT, DPT, NCS 02/14/21, 9:16 AM [phone]

## 2021-02-14 NOTE — TOC Initial Note (Addendum)
Transition of Care [facility]) - Initial/Assessment Note    Patient Details  Name: [name] MRN: [id number] Date of Birth: 1926/02/13  Transition of Care [facility]) CM/SW Contact:    Elliot Gurney [place], [place] Phone Number: [phone] 02/14/2021, 3:55 PM  Clinical Narrative:                 This social worker consulted to assist with referral for residential hospice. Patient from [place] alone. Patient's sister here from [place], plans to be here as long as needed for support.  Patient's sister contacted to discuss hospice recommendation. Patient's sister agreeable to transfer to residential hospice only if necessary.   Tammie through [facility] contacted [phone]-referral completed.   Transition of Care to continue to follow to determine discharge needs    Barriers to Discharge: Continued Medical Work up   Patient Goals and CMS Choice Patient states their goals for this hospitalization and ongoing recovery are:: patient being referred for residential hospice      Expected Discharge Plan and Services   In-house Referral: Clinical Social Work   Post Acute Care Choice: Hospice Living arrangements for the past 2 months: Materials engineer ([facility] [employer])                                      [place] Arrangements/Services Living arrangements for the past 2 months: Materials engineer ([facility] [employer]) Lives with:: Self   Do you feel safe going back to the place where you live?: No   Residential Hospice recommended  Need for Family Participation in Patient Care: Yes (Comment) Care giver support system in place?: Yes (comment) Current home services: DME, Homehealth aide Criminal Activity/Legal Involvement Pertinent to Current Situation/Hospitalization: No - Comment as needed  Activities of Daily Living      Permission Sought/Granted                  Emotional Assessment          Alcohol / Substance Use: Not Applicable Psych Involvement: No (comment)  Admission diagnosis:  Ischemia of right lower extremity [I99.8] Compartment syndrome of foot, right, initial encounter ([place]) [T79.A21A] Patient Active Problem List   Diagnosis Date Noted   Pressure injury of skin 02/13/2021   Compartment syndrome of foot, right, initial encounter ([place]) 02/13/2021   Ischemia of right lower extremity 02/12/2021   CKD (chronic kidney disease), stage IIIa 02/12/2021   HLD (hyperlipidemia) 31/49/7026   Acute metabolic encephalopathy 37/85/8850   Exudative age-related macular degeneration of left eye ([place]) 01/13/2020   Advanced nonexudative age-related macular degeneration of both eyes with subfoveal involvement 01/13/2020   Choroidal neovascularization of right eye 01/13/2020   Primary open angle glaucoma of both eyes, severe stage 01/13/2020   Chronic renal disease, stage 4, severely decreased glomerular filtration rate (GFR) between 15-29 mL/min/1.73 square meter ([place]) 03/04/2019   Generalized abdominal tenderness without rebound tenderness 03/04/2019   UTI (urinary tract infection) 11/01/2018   Osteoarthritis of right hip 10/04/2018   TSH elevation 09/24/2018   Frequent UTI 11/09/2017   Leukocytosis 02/06/2016   Dysuria 06/05/2015   Hyperlipidemia with target LDL less than 160 09/24/2014   Routine general medical examination at a health care facility 09/24/2014   MACULAR DEGENERATION 08/07/2008   Glaucoma 08/07/2008   Insomnia 08/07/2008   Constipation 07/22/2008   NEURALGIA, TRIGEMINAL 01/07/2008   Allergic rhinitis 09/03/2007  COPD (chronic obstructive pulmonary disease) ([place]) 09/03/2007   GERD 09/03/2007   Osteoarthritis 09/03/2007   PCP:  Hoy Register, MD Pharmacy:   [place], [place] - [address] [place] [address] Phone: [phone] Fax: [phone]  [place], [place] - [address] [place] [address] Phone: [phone] Fax: [phone]     Social Determinants of Health (SDOH) Interventions    Readmission Risk Interventions No flowsheet data found.

## 2021-02-14 NOTE — Progress Notes (Signed)
1 Day Post-Op   Subjective/Chief Complaint: Patient is more alert and complains of generalized pain not specific of her lower extremities.  VAC dressing in place over right fasciotomy incisions.  Patient's urine output has improved through the night maintaining good oxygen saturation and blood pressure.  The right foot remains paralyzed however the patient has sensation intact.  The left foot continues not to have a Doppler signal, however there is sensation and movement.  Had a lengthy discussion of comfort measures with the family.  The family seems to want the route of conservative/comfort measures only at this time.   Objective: Vital signs in last 24 hours: Temp:  [97 F (36.1 C)-98.9 F (37.2 C)] 97.6 F (36.4 C) (10/23 1200) Pulse Rate:  [57-85] 80 (10/23 1100) Resp:  [10-25] 10 (10/23 1300) BP: (71-164)/(41-121) 120/59 (10/23 1300) SpO2:  [94 %-100 %] 97 % (10/23 1200)    Intake/Output from previous day: 10/22 0701 - 10/23 0700 In: 4608.3 [P.O.:60; I.V.:3917.7; IV Piggyback:630.5] Out: 452 [Urine:450; Blood:2] Intake/Output this shift: Total I/O In: 247.1 [I.V.:247.1] Out: 120 [Urine:120]  General appearance: alert, distracted, no distress, and pale Chest wall: no tenderness Cardio: regular rate and rhythm, S1, S2 normal, no murmur, click, rub or gallop Extremities: Right VAC dressing intact over the medial and lateral aspect of the leg.  Notable distention of the right and left muscles of the calf.   Incision/Wound: Left groin puncture site clean and dry.  Lab Results:  Recent Labs    02/13/21 0451 02/14/21 0526  WBC 25.9* 17.0*  HGB 11.4* 8.2*  HCT 36.0 24.8*  PLT 239 159   BMET Recent Labs    02/13/21 0451 02/14/21 0526  NA 140 140  K 5.1 3.8  CL 111 112*  CO2 19* 17*  GLUCOSE 120* 241*  BUN 24* 33*  CREATININE 1.03* 1.83*  CALCIUM 8.8* 7.1*   PT/INR Recent Labs    02/12/21 0744  LABPROT 14.6  INR 1.1   ABG No results for input(s): PHART,  HCO3 in the last 72 hours.  Invalid input(s): PCO2, PO2  Studies/Results: CT HEAD WO CONTRAST (5MM)  Result Date: 02/13/2021 CLINICAL DATA:  Mental status changes EXAM: CT HEAD WITHOUT CONTRAST TECHNIQUE: Contiguous axial images were obtained from the base of the skull through the vertex without intravenous contrast. COMPARISON:  Recent CT head 01/20/2021 FINDINGS: Brain: No evidence of acute infarction, hemorrhage, hydrocephalus, extra-axial collection or mass lesion/mass effect. Stable cortical and central atrophy with ex vacuo ventriculomegaly. Vascular: No hyperdense vessel or unexpected calcification. Skull: Normal. Negative for fracture or focal lesion. Sinuses/Orbits: No acute finding. Other: None. IMPRESSION: No acute intracranial abnormality. Electronically Signed   By: Jacqulynn Cadet M.D.   On: 02/13/2021 07:33   PERIPHERAL VASCULAR CATHETERIZATION  Result Date: 02/12/2021 See surgical note for result.   Anti-infectives: Anti-infectives (From admission, onward)    Start     Dose/Rate Route Frequency Ordered Stop   02/13/21 1200  ceFAZolin (ANCEF) IVPB 2g/100 mL premix        2 g 200 mL/hr over 30 Minutes Intravenous Every 8 hours 02/13/21 1111 02/13/21 2217   02/12/21 0847  ceFAZolin (ANCEF) IVPB 2g/100 mL premix        2 g 200 mL/hr over 30 Minutes Intravenous 30 min pre-op 02/12/21 0847 02/12/21 1830       Assessment/Plan: s/p Procedure(s) with comments: FASCIOTOMY Right lower extremity, all 4 compartments (Right) APPLICATION OF WOUND VAC (Right) - [id number] Continue with comfort measures.  Approximation  of the skin to the medial fasciotomy incision may be warranted tomorrow.  Right lateral incision may be more difficult to close due to more severe distention of muscles.   LOS: 2 days    Elmore Guise 02/14/2021

## 2021-02-15 ENCOUNTER — Encounter: Payer: Self-pay | Admitting: Vascular Surgery

## 2021-02-15 DIAGNOSIS — I998 Other disorder of circulatory system: Secondary | ICD-10-CM | POA: Diagnosis not present

## 2021-02-15 MED ORDER — SODIUM CHLORIDE 0.9% FLUSH: 9.0000 mL | INTRAVENOUS | Status: DC | PRN

## 2021-02-15 MED ORDER — ONDANSETRON HCL 4 MG/2ML IJ SOLN: 4.0000 mg | Freq: Four times a day (QID) | INTRAMUSCULAR | Status: DC | PRN

## 2021-02-15 MED ORDER — OXYCODONE HCL 5 MG PO TABS: 5.0000 mg | ORAL_TABLET | ORAL | Status: DC | PRN

## 2021-02-15 MED ORDER — LORAZEPAM 2 MG/ML IJ SOLN
1.0000 mg | Freq: Four times a day (QID) | INTRAMUSCULAR | Status: DC | PRN
  Administered 2021-02-15 – 2021-02-16 (×2): 1 mg via INTRAVENOUS
  Filled 2021-02-15 (×2): qty 1

## 2021-02-15 MED ORDER — MORPHINE SULFATE (PF) 2 MG/ML IV SOLN
2.0000 mg | INTRAVENOUS | Status: DC | PRN
Start: 2021-02-15 — End: 2021-02-16
  Administered 2021-02-15 – 2021-02-16 (×2): 2 mg via INTRAVENOUS
  Filled 2021-02-15 (×2): qty 1

## 2021-02-15 MED ORDER — DIPHENHYDRAMINE HCL 50 MG/ML IJ SOLN: 12.5000 mg | Freq: Four times a day (QID) | INTRAMUSCULAR | Status: DC | PRN

## 2021-02-15 MED ORDER — HYDROMORPHONE 1 MG/ML IV SOLN: INTRAVENOUS | Status: DC

## 2021-02-15 MED ORDER — DIPHENHYDRAMINE HCL 12.5 MG/5ML PO ELIX: 12.5000 mg | ORAL_SOLUTION | Freq: Four times a day (QID) | ORAL | Status: DC | PRN

## 2021-02-15 MED ORDER — NALOXONE HCL 0.4 MG/ML IJ SOLN: 0.4000 mg | INTRAMUSCULAR | Status: DC | PRN

## 2021-02-15 MED ORDER — LORAZEPAM 2 MG/ML IJ SOLN
1.0000 mg | INTRAMUSCULAR | Status: DC | PRN
  Administered 2021-02-15: 1 mg via INTRAVENOUS
  Filled 2021-02-15: qty 1

## 2021-02-15 MED ORDER — HYDROMORPHONE HCL 1 MG/ML IJ SOLN
1.0000 mg | INTRAMUSCULAR | Status: DC | PRN
  Administered 2021-02-15: 1 mg via INTRAVENOUS
  Filled 2021-02-15: qty 1

## 2021-02-15 NOTE — NC FL2 (Signed)
[place] LEVEL OF CARE SCREENING TOOL     IDENTIFICATION  Patient Name: [name] Birthdate: 1925/09/21 Sex: female Admission Date (Current Location): 02/12/2021  [facility] and [place] Number:  Engineering geologist and Address:  [facility], [address] [place],  [address]      Provider Number: [id number]  Attending Physician Name and Address:  Fritzi Mandes, MD  Relative Name and Phone Number:  [place],Shirley (Sister)   [phone]    Current Level of Care: Hospital Recommended Level of Care: [place] Prior Approval Number:    Date Approved/Denied:   PASRR Number: [id number]  Discharge Plan: Other (Comment) (LTC)    Current Diagnoses: Patient Active Problem List   Diagnosis Date Noted   Pressure injury of skin 02/13/2021   Compartment syndrome of foot, right, initial encounter ([place]) 02/13/2021   Ischemia of right lower extremity 02/12/2021   CKD (chronic kidney disease), stage IIIa 02/12/2021   HLD (hyperlipidemia) 81/15/7262   Acute metabolic encephalopathy 03/55/9741   Exudative age-related macular degeneration of left eye ([place]) 01/13/2020   Advanced nonexudative age-related macular degeneration of both eyes with subfoveal involvement 01/13/2020   Choroidal neovascularization of right eye 01/13/2020   Primary open angle glaucoma of both eyes, severe stage 01/13/2020   Chronic renal disease, stage 4, severely decreased glomerular filtration rate (GFR) between 15-29 mL/min/1.73 square meter ([place]) 03/04/2019   Generalized abdominal tenderness without rebound tenderness 03/04/2019   UTI (urinary tract infection) 11/01/2018   Osteoarthritis of right hip 10/04/2018   TSH elevation 09/24/2018   Frequent UTI 11/09/2017   Leukocytosis 02/06/2016   Dysuria 06/05/2015   Hyperlipidemia with target LDL less than 160 09/24/2014   Routine general medical examination at a health care facility 09/24/2014    MACULAR DEGENERATION 08/07/2008   Glaucoma 08/07/2008   Insomnia 08/07/2008   Constipation 07/22/2008   NEURALGIA, TRIGEMINAL 01/07/2008   Allergic rhinitis 09/03/2007   COPD (chronic obstructive pulmonary disease) ([place]) 09/03/2007   GERD 09/03/2007   Osteoarthritis 09/03/2007    Orientation RESPIRATION BLADDER Height & Weight     Self  Normal   Weight: 53.4 kg Height:     BEHAVIORAL SYMPTOMS/MOOD NEUROLOGICAL BOWEL NUTRITION STATUS      Continent    AMBULATORY STATUS COMMUNICATION OF NEEDS Skin   Extensive Assist Verbally                         Personal Care Assistance Level of Assistance  Bathing, Feeding, Dressing Bathing Assistance: Limited assistance Feeding assistance: Limited assistance Dressing Assistance: Limited assistance     Functional Limitations Info  Sight, Hearing, Speech Sight Info: Impaired Hearing Info: Adequate Speech Info: Adequate    SPECIAL CARE FACTORS FREQUENCY                       Contractures Contractures Info: Not present    Additional Factors Info                  Current Medications (02/15/2021):  This is the current hospital active medication list Current Facility-Administered Medications  Medication Dose Route Frequency Provider Last Rate Last Admin   0.9 %  sodium chloride infusion  250 mL Intravenous PRN Algernon Huxley, MD   Stopped at 02/15/21 0130   acetaminophen (TYLENOL) tablet 325-650 mg  325-650 mg Oral Q4H PRN Elmore Guise, MD       Or   acetaminophen (TYLENOL)  suppository 325-650 mg  325-650 mg Rectal Q4H PRN Elmore Guise, MD       albuterol (PROVENTIL) (2.5 MG/3ML) 0.083% nebulizer solution 2.5 mg  2.5 mg Inhalation Q4H PRN Lucky Cowboy, Erskine Squibb, MD       alum & mag hydroxide-simeth (MAALOX/MYLANTA) 200-200-20 MG/5ML suspension 15-30 mL  15-30 mL Oral Q2H PRN Elmore Guise, MD       Chlorhexidine Gluconate Cloth 2 % PADS 6 each  6 each Topical Daily Elmore Guise, MD   6 each at 02/15/21 0840    docusate sodium (COLACE) capsule 100 mg  100 mg Oral BID PRN Algernon Huxley, MD       famotidine (PEPCID) tablet 20 mg  20 mg Oral QHS Fritzi Mandes, MD   20 mg at 02/14/21 2038   guaiFENesin-dextromethorphan (ROBITUSSIN DM) 100-10 MG/5ML syrup 15 mL  15 mL Oral Q4H PRN Elmore Guise, MD       heparin injection 5,000 Units  5,000 Units Subcutaneous Q8H Elmore Guise, MD   5,000 Units at 02/15/21 0531   HYDROmorphone (DILAUDID) injection 1 mg  1 mg Intravenous Q2H PRN Fritzi Mandes, MD       LORazepam (ATIVAN) injection 1 mg  1 mg Intravenous Q6H PRN Fritzi Mandes, MD       morphine 2 MG/ML injection 2 mg  2 mg Intravenous Q2H PRN Fritzi Mandes, MD       ondansetron [facility]) injection 4 mg  4 mg Intravenous Q6H PRN Elmore Guise, MD   4 mg at 02/13/21 1812   oxyCODONE (Oxy IR/ROXICODONE) immediate release tablet 5 mg  5 mg Oral Q4H PRN Fritzi Mandes, MD       phenol (CHLORASEPTIC) mouth spray 1 spray  1 spray Mouth/Throat PRN Elmore Guise, MD       sodium chloride flush (NS) 0.9 % injection 3 mL  3 mL Intravenous Q12H Algernon Huxley, MD   3 mL at 02/14/21 1119   sodium chloride flush (NS) 0.9 % injection 3 mL  3 mL Intravenous PRN Algernon Huxley, MD       timolol (TIMOPTIC) 0.5 % ophthalmic solution 1 drop  1 drop Both Eyes Daily Algernon Huxley, MD   1 drop at 02/14/21 2038     Discharge Medications: Please see discharge summary for a list of discharge medications.  Relevant Imaging Results:  Relevant Lab Results:   Additional Information SS# [id number] Also need outpatient Palliative  Kerin Salen, RN

## 2021-02-15 NOTE — Progress Notes (Signed)
[facility] Daily Progress Note  02/12/21:             1.  Aortogram and right lower extremity angiogram             2.  Catheter placement into right popliteal artery from left femoral approach             3.  Mechanical thrombectomy of right common femoral artery, profunda femoris artery, superficial femoral artery, and popliteal arteries with the penumbra CAT 7 device             4.  Viabahn stent placement to the right common femoral artery with 7 mm diameter by 5 cm length stent             5.  Lifestream stent placement to the left common iliac artery with 9 mm diameter by 58 mm length lifestream stent             6.  StarClose closure device left femoral artery  02/12/21:             1.  Ultrasound guidance for vascular access left femoral artery             2.  Catheter placement into right SFA from left femoral approach             3.  Aortogram and selective right lower extremity angiogram             4.  Catheter directed thrombolytic therapy with 6 mg of tPA to the right common femoral artery and superficial femoral artery             5.  Mechanical thrombectomy of the right external iliac artery, common femoral artery, superficial femoral artery, popliteal artery, tibioperoneal trunk, and profunda femoris artery with the penumbra CAT 7 catheter             6.  Percutaneous transluminal angioplasty of the right SFA with 4 mm diameter by 22 cm length and 4 mm diameter by 15 cm length Lutonix drug-coated angioplasty balloons             7.  Percutaneous transluminal angioplasty of right external iliac artery and most proximal common femoral artery with 6 mm diameter by 6 cm length Lutonix drug-coated angioplasty balloon             8.  Placement of infusion catheter continued thrombolytic therapy in both iliac arteries and the distal aorta across the aortic bifurcation with a 90 cm total length 20 cm working length catheter  Subjective: Patient is nonresponsive  verbally this afternoon.  Really on a morphine drip.  Family at bedside who has decided to reverse comfort care measures.  Objective: Vitals:   02/15/21 0354 02/15/21 0738 02/15/21 0800 02/15/21 1127  BP: 120/68 116/60 119/60 115/61  Pulse: 82 75 88 85  Resp: 17 18 18 17   Temp: 98.1 F (36.7 C) 99 F (37.2 C) 98.8 F (37.1 C) 98.6 F (37 C)  TempSrc: Oral  Oral Oral  SpO2: 98% (!) 86% (!) 82% (!) 88%  Weight:        Intake/Output Summary (Last 24 hours) at 02/15/2021 1514 Last data filed at 02/15/2021 1347 Gross per 24 hour  Intake 81.96 ml  Output 1375 ml  Net -1293.04 ml   Physical Exam: A&Ox3, NAD CV: RRR Pulmonary: CTA Bilaterally Abdomen: Soft, Nontender, Nondistended Vascular:  Right lower extremity: Thigh soft.  Calf soft.  Calf  is still very edematous.  VAC is intact and to suction without leak.  Foot is warm.     Laboratory: CBC    Component Value Date/Time   WBC 17.0 (H) 02/14/2021 0526   HGB 8.2 (L) 02/14/2021 0526   HCT 24.8 (L) 02/14/2021 0526   PLT 159 02/14/2021 0526   BMET    Component Value Date/Time   NA 140 02/14/2021 0526   NA 142 02/16/2016 0000   K 3.8 02/14/2021 0526   CL 112 (H) 02/14/2021 0526   CO2 17 (L) 02/14/2021 0526   GLUCOSE 241 (H) 02/14/2021 0526   BUN 33 (H) 02/14/2021 0526   BUN 14 02/16/2016 0000   CREATININE 1.83 (H) 02/14/2021 0526   CALCIUM 7.1 (L) 02/14/2021 0526   GFRNONAA 25 (L) 02/14/2021 0526   GFRAA >60 11/03/2018 0508   Assessment/Planning: The patient is a 87 year old female who presented to [facility] emergency department with an ischemic right lower extremity status post endovascular intervention and subsequent compartment syndrome requiring fasciotomy - POD#2  1) patient's family has decided to reverse comfort care measures. 2) operating room VAC has been removed.  Fasciotomy sites are healthy with minimal drainage.  Still very edematous. 3) new VAC was placed.  Appreciate the  assistance of the WOCN nurses.  We will plan on VAC changes Mondays and Thursdays. 4) I had a long discussion with the patient's family members at the bedside.  We discussed the patient's ischemia and subsequent compartment syndrome needing fasciotomies.  Family members were present for the [facility] change and saw the wounds.  They also understand that she will need twice weekly dressing changes.  They also understand that once her edema has resolved she will need to return to the operating room for closure. 5) next VAC change on Thursday.  Discussed with Dr. Ellis Parents Dorathy Stallone PA-C 02/15/2021 3:14 PM

## 2021-02-15 NOTE — Consult Note (Signed)
[facility] Nurse Consult Note: Reason for Consult: NPWT dressing change to RLE fasciotomy sites (x2). I am assisted today by Vascular Surgery PA-C K. Stegmayer and her expertise and assistance is appreciated. Photos are taken for the EMR today by Ms. Stegmayer. Family in to see wounds prior to placement of NPWT. Wound type: Surgical Pressure Injury POA: N/A Measurement: RLE Lateral:  12.5cm x 4cm x 1.8cm RLE Medial: 11cm x 3cm x 1.2cm Wound bed: Beefy red, moist Drainage (amount, consistency, odor) moderate amount serosanguinous Periwound:intact, dry Dressing procedure/placement/frequency: NPWT dressings removed.  Small area of MARSI with xeroform dressing distal to wounds at anterior LE noted. Wound bed protected with Mepitel One silicone wound contact layer and allowed to overlap onto intact periwound tissue. Once piece of foam placed over each wound (2 pieces total) and covered with drape. Dressings are connected to 146mmHg continuous negative pressure via a "Y" connector and an immediate seal is achieved over each wound. Tubing is labeled.  Next dressing change is due on Thursday, 02/18/21. No supplies are currently in the room for that change.  Bilateral heel pressure redistribution boots are ordered today. A sacral foam is ordered today for PI prevention.  [facility] nursing team will follow while in house, and will remain available to this patient, the nursing and medical teams.   Thanks, Maudie Flakes, MSN, RN, [place], Arther Abbott  Pager# [phone]

## 2021-02-15 NOTE — Progress Notes (Signed)
Patient ID: [name], female   DOB: [birth year], 87 y.o.   MRN: [id number] [place] [facility] [place] NAME: [name]    MR#:  [id number]  DATE OF BIRTH:  11/03/1925  SUBJECTIVE:  patient came in from her long-term facility with right leg pain. Found to have acute right ischemic limb underwent angiogram and anterior compartment fascia surgery and has wound vac  patient sisters in the room. Had a long conversation with him. Patient is more awake and conversely. She is oriented to person in place. She is been eating this morning.   REVIEW OF SYSTEMS:   Review of Systems  Unable to perform ROS: Mental status change  Tolerating Diet: Tolerating PT:   DRUG ALLERGIES:   Allergies  Allergen Reactions   Codeine Itching and Nausea Only    Small amounts okay   Pregabalin Other (See Comments)    Confusion and hallucinations   Promethazine Hcl Other (See Comments)    Muscle cramps   Sulfonamide Derivatives Nausea Only    VITALS:  Blood pressure 115/61, pulse 85, temperature 98.6 F (37 C), temperature source Oral, resp. rate 17, weight 53.4 kg, SpO2 (!) 88 %.  PHYSICAL EXAMINATION:   Physical Examlimited  GENERAL:  87 y.o.-year-old patient lying in the bed with no acute distress.  LUNGS: Normal breath sounds bilaterally, no wheezing, rales, rhonchi. No use of accessory muscles of respiration.  CARDIOVASCULAR: S1, S2 normal. No murmurs, rubs, or gallops.  ABDOMEN: Soft, nontender, nondistended. EXTREMITIES: right LE surgical dressing+ with wound VAC and left foot no pedal pulses NEUROLOGIC: unable to assess.  PSYCHIATRIC awake and restless   LABORATORY PANEL:  CBC Recent Labs  Lab 02/14/21 0526  WBC 17.0*  HGB 8.2*  HCT 24.8*  PLT 159     Chemistries  Recent Labs  Lab 02/14/21 0526  NA 140  K 3.8  CL 112*  CO2 17*  GLUCOSE 241*  BUN 33*  CREATININE 1.83*  CALCIUM 7.1*  AST 447*  ALT 61*  ALKPHOS 45  BILITOT  0.6    Cardiac Enzymes No results for input(s): TROPONINI in the last 168 hours. RADIOLOGY:  No results found. ASSESSMENT AND PLAN:  HPI: [name] is a 87 y.o. female with medical history significant of hyperlipidemia, COPD, GERD, depression, urinary incontinence, small bowel obstruction, memory loss, bradycardia, UTI, who presents with right foot and left lower leg pain.  Per ED physician, pt was seen in ED due to right hip pain. She had x-ray of right hip which showed bilateral hip replacement, but was negative for acute issues. After went home, she developed severe pain in right foot and  right lower extremity.  Her sensation in right foot is decreased.  Patient was found to have cool, mottled and dusky right foot and right lower leg. No DP or PT pulse is palpated or dopplered in ED per EDP.   Ischemia of right lower extremity: pt has patient has critical ischemic limb in right lower extremity.  --10/21-- IV heparin was started in ED.   --Dr. Lucky Cowboy of [facility] is consulted s/p  Catheter directed thrombolytic therapy with 6 mg of tPA to the right common femoral artery and superficial femoral artery *Mechanical thrombectomy of the right external iliac artery, common femoral artery, superficial femoral artery, popliteal artery, tibioperoneal trunk, and profunda femoris artery  *Percutaneous transluminal angioplasty of the right SFA,right external iliac artery and most proximal common femoral artery and  Placement  of infusion catheter continued thrombolytic therapy in both iliac arteries and the distal aorta across the aortic bifurcation with a 90 cm total length 20 cm working length catheter --10/22-- patient underwent urgent right lower extremity anterior compartment fascia out to me. Surgery went well according to dr Baruch Gouty. There was no necrosis of muscle. IV Aggrastat to be started today. -- Patient does not have left lower extremity pulses. Vascular seems patient is too high risk for any  of the procedure given comorbidities and age. This was discussed at length with patient's sister [name] and her husband over the phone they do understand patient's overall condition. -- Palliative care consulted. -- PRN IV pain meds --10/23-- patient is significant amount of pain and remains intermittently confused.  --Dr Baruch Gouty also discussed overall poor prognosis with patient given bilateral loss of pulses in both lower extremity -- Discussed at length with patient's sister Ms. [name] and few other family members and their goal is to keep patient comfort care and focus on her pain management. They are in agreement to start IV morphine drip if current IV and PO pain meds do not help her. Hospice discussion was made. -- TOC for hospice referral  --10/23-- patient is alert and oriented times two. More coherent. Patient sisters in the room. Discussed plan with sisters. Discontinue comfort measures. Will place patient on Dilaudid PCI pump. Will have PT/OT see patient. TOC for discharge planning -- patient was seen by vascular surgery. Wound VAC change. Tissue looks healthy.   COPD (chronic obstructive pulmonary disease) ([place]): stable -As needed albuterol   GERD -Protonix   Leukocytosis: WBC 15.7, no source of infection identified, likely reactive   acute on CKD (chronic kidney disease), stage IIIa: Stable.  Creatinine 0.92, BUN 24 -Follow-up with BMP -- patient with dark urine suspicion for myoglobin urea given compartment syndrome. -- Patient to continue oral intake of fluids.   HLD (hyperlipidemia): Patient is not taking statin  Acute metabolic encephalopathy/?cognitive decline at basline  Not sure about baseline mental status.  Since patient is receiving heparin and thrombolysis treatment, will get CT head --CT of head-- unremarkable  patient will benefit from outpatient palliative care to follow her.   Procedures: right lower extremity angiogram, right anterior compartment  fascia alchemy Family communication : sister [name] at bedside  ICU consults : vascular, nephrology CODE STATUS: DNR DVT Prophylaxis : heparin Level of care: Med-Surg Status is: Inpatient  DC comfort care orders. Had a long conversation with patient's two sisters. There in agreement with it. In the event patient's condition worsens will consider comfort care at that time. Patient's family understands he will not be any more vascular intervention done on either extremity given high risk.      TOTAL TIME TAKING CARE OF THIS PATIENT: 25 minutes.  >50% time spent on counselling and coordination of care  Note: This dictation was prepared with Dragon dictation along with smaller phrase technology. Any transcriptional errors that result from this process are unintentional.  Fritzi Mandes M.D    [facility]   CC: Primary care physician; Pccm, Ander Gaster, MD

## 2021-02-15 NOTE — Progress Notes (Signed)
[facility]) Hospital Liaison Note  Notified by Donnetta Hutching, RN [facility] manager of patient/family request for [facility] Palliative services at home after discharge.  [facility] hospital liaison will follow patient for discharge disposition.  Please call with any hospice or outpatient palliative care related questions.  Thank you for the opportunity to participate in this patient's care.  Bobbie "Loren Racer, [place], [place] [phone]

## 2021-02-15 NOTE — Progress Notes (Signed)
PT Cancellation Note  Patient Details Name: [name] MRN: [id number] DOB: 16-Apr-1926   Cancelled Treatment:    Reason Eval/Treat Not Completed: Other (comment). Pt sleeping soundly at PT entrance. Does wake momentarily with sternal rub, but unable to meaningfully participate in PT evaluation this date, PT to re-attempt as able.   Lieutenant Diego PT, DPT 2:54 PM,02/15/21

## 2021-02-15 NOTE — TOC Progression Note (Addendum)
Transition of Care [facility]) - Progression Note    Patient Details  Name: [name] MRN: [id number] Date of Birth: Apr 22, 1926  Transition of Care [facility]) CM/SW [place], RN Phone Number: 02/15/2021, 2:25 PM  Clinical Narrative: Felizardo Hoffmann, [phone] was informed that patient's family decided on LTC with Palliative. No discharge orders at this time , waiting for documentation. See Palliative progress note, outpatient palliative referral done, will complete LTC bed search. Attempted gto call family to discuss LTC options, LVM. Called [facility], spoke with Leonette Nutting, about LTC, she did confirm that they offered LTC for their residents who is in ALF/ILF, will discuss with nurse and call back. 3:30pm Sister returned call, currently at [place] will call back due to [facility]. Friend of the family called to voice assistance with sister who is [facility], friend provided the information about LTC and stated that they were at [facility] waiting to speak with Joelene Millin, I did inform friend that I have spoken to Maudie Mercury and is currently waiting for LTC approval call. Friend voices proceeding with LTC to other facilities. FL2 completed and bed search started.      Barriers to Discharge: Continued Medical Work up  Expected Discharge Plan and Services   In-house Referral: Clinical Social Work   Post Acute Care Choice: Hospice Living arrangements for the past 2 months: Materials engineer ([facility] [place])                                       Social Determinants of Health ([place]) Interventions    Readmission Risk Interventions No flowsheet data found.

## 2021-02-15 NOTE — Care Management Important Message (Signed)
Important Message  Patient Details  Name: [name] MRN: [id number] Date of Birth: 09/19/1925   Medicare Important Message Given:  Other (see comment)  10/23 Patient placed on comfort care with plan to DC to [place] when bed available. Out of respect for the patient and family no Important Message from [facility] given.  [name] 02/15/2021, 9:58 AM

## 2021-02-15 NOTE — Progress Notes (Signed)
Nutrition Brief Note  Patient identified on the Malnutrition Screening Tool (MST) Report. Chart reviewed. Pt now on comfort care.   No nutrition interventions warranted at this time. If nutrition issues arise, please consult RD.  Ranell Patrick, RD, LDN Clinical Dietitian RD pager # available in [place]  After hours/weekend pager # available in [facility]

## 2021-02-16 DIAGNOSIS — I998 Other disorder of circulatory system: Secondary | ICD-10-CM | POA: Diagnosis not present

## 2021-02-16 LAB — GLUCOSE, CAPILLARY: Glucose-Capillary: 81 mg/dL (ref 70–99)

## 2021-02-16 MED ORDER — HYDROMORPHONE HCL 1 MG/ML IJ SOLN
0.5000 mg | INTRAMUSCULAR | Status: DC | PRN
  Administered 2021-02-17 – 2021-02-19 (×2): 0.5 mg via INTRAVENOUS
  Filled 2021-02-16 (×2): qty 1

## 2021-02-16 MED ORDER — MORPHINE SULFATE (PF) 2 MG/ML IV SOLN
2.0000 mg | INTRAVENOUS | Status: DC | PRN
  Administered 2021-02-16: 2 mg via INTRAVENOUS
  Filled 2021-02-16: qty 1

## 2021-02-16 MED ORDER — MORPHINE 100MG IN NS 100ML (1MG/ML) PREMIX INFUSION
1.5000 mg/h | INTRAVENOUS | Status: DC
  Administered 2021-02-16 – 2021-02-18 (×2): 1.5 mg/h via INTRAVENOUS
  Filled 2021-02-16 (×2): qty 100

## 2021-02-16 MED ORDER — SODIUM CHLORIDE 0.9 % IV SOLN: INTRAVENOUS | Status: DC

## 2021-02-16 NOTE — Progress Notes (Signed)
PT Cancellation Note  Patient Details Name: [name] MRN: [id number] DOB: 18-Apr-1926   Cancelled Treatment:    Reason Eval/Treat Not Completed: Other (comment) Pt was unable to follow one-step commands and unable to participate in skilled therapy this date. Pt was repositioned in bed with attempts made for pt participation for approximately 15 minutes (10:14 AM - 10:30 AM) with lack of pt involvement. PT is signing off at this time after speaking with MD due to plan to transition to comfort care.   [employer], SPT

## 2021-02-16 NOTE — TOC Progression Note (Addendum)
Transition of Care [facility]) - Progression Note    Patient Details  Name: [name] MRN: [id number] Date of Birth: Jan 18, 1926  Transition of Care [facility]) CM/SW Contact  Candie Chroman, LCSW Phone Number: 02/16/2021, 11:46 AM  Clinical Narrative:   Per [facility] of nursing, patient's sisters do want comfort care. MD has been notified. She said she had given sisters the option yesterday with improvement in alertness and appetite and decided to revoke to see how she does. [facility] will be unable to take her until they see that her pain can be managed for 1-2 days without pain medication. They will also have to order a wound vac. Will likely not be able to accept until end of this week or first of next week.  2:54 pm: Patient now back on comfort care. Notified [facility] [employer] [facility]. Notified [facility] representative who will be covering her from [facility] team the next few days.    Barriers to Discharge: Continued Medical Work up  Expected Discharge Plan and Services   In-house Referral: Clinical Social Work   Post Acute Care Choice: Hospice Living arrangements for the past 2 months: Materials engineer ([facility] [place])                                       Social Determinants of Health ([place]) Interventions    Readmission Risk Interventions No flowsheet data found.

## 2021-02-16 NOTE — Progress Notes (Signed)
Patient ID: LILLION ELBERT, female   DOB: 20-Feb-1926, 87 y.o.   MRN: [id number] [place] [facility] [place] NAME: Devlin Mcveigh    MR#:  [id number]  DATE OF BIRTH:  06-15-1925  SUBJECTIVE:  patient came in from her long-term facility with right leg pain. Found to have acute right ischemic limb underwent angiogram and anterior compartment fascia surgery and has wound vac  patient sisters in the room. Had a long consult conversation with two of patient sisters again. There was some misunderstanding with her sister regarding the discharge plan. They clearly tell me they want patient to be on comfort care continue do not resuscitate did not intubate and consider hospice facility.   REVIEW OF SYSTEMS:   Review of Systems  Unable to perform ROS: Mental status change  Tolerating Diet: Tolerating PT:   DRUG ALLERGIES:   Allergies  Allergen Reactions   Codeine Itching and Nausea Only    Small amounts okay   Pregabalin Other (See Comments)    Confusion and hallucinations   Promethazine Hcl Other (See Comments)    Muscle cramps   Sulfonamide Derivatives Nausea Only    VITALS:  Blood pressure (!) 148/74, pulse 89, temperature 98.5 F (36.9 C), temperature source Oral, resp. rate 15, weight 53.4 kg, SpO2 (!) 87 %.  PHYSICAL EXAMINATION:   Physical Examlimited  GENERAL:  87 y.o.-year-old patient lying in the bed with no acute distress.  LUNGS: Normal breath sounds bilaterally, no wheezing, rales, rhonchi. No use of accessory muscles of respiration.  CARDIOVASCULAR: S1, S2 normal. No murmurs, rubs, or gallops.  ABDOMEN: Soft, nontender, nondistended. EXTREMITIES: right LE surgical dressing+ with wound VAC and left foot no pedal pulses NEUROLOGIC: unable to assess.  PSYCHIATRIC awake and restless   LABORATORY PANEL:  CBC Recent Labs  Lab 02/14/21 0526  WBC 17.0*  HGB 8.2*  HCT 24.8*  PLT 159     Chemistries  Recent Labs  Lab  02/14/21 0526  NA 140  K 3.8  CL 112*  CO2 17*  GLUCOSE 241*  BUN 33*  CREATININE 1.83*  CALCIUM 7.1*  AST 447*  ALT 61*  ALKPHOS 45  BILITOT 0.6    Cardiac Enzymes No results for input(s): TROPONINI in the last 168 hours. RADIOLOGY:  No results found. ASSESSMENT AND PLAN:  HPI: WILHEMENIA CAMBA is a 87 y.o. female with medical history significant of hyperlipidemia, COPD, GERD, depression, urinary incontinence, small bowel obstruction, memory loss, bradycardia, UTI, who presents with right foot and left lower leg pain.  Per ED physician, pt was seen in ED due to right hip pain. She had x-ray of right hip which showed bilateral hip replacement, but was negative for acute issues. After went home, she developed severe pain in right foot and  right lower extremity.  Her sensation in right foot is decreased.  Patient was found to have cool, mottled and dusky right foot and right lower leg. No DP or PT pulse is palpated or dopplered in ED per EDP.   Ischemia of right lower extremity: pt has patient has critical ischemic limb in right lower extremity.  --10/21-- IV heparin was started in ED.   --Dr. Lucky Cowboy of [facility] is consulted s/p  Catheter directed thrombolytic therapy with 6 mg of tPA to the right common femoral artery and superficial femoral artery *Mechanical thrombectomy of the right external iliac artery, common femoral artery, superficial femoral artery, popliteal artery, tibioperoneal trunk, and profunda femoris  artery  *Percutaneous transluminal angioplasty of the right SFA,right external iliac artery and most proximal common femoral artery and  Placement of infusion catheter continued thrombolytic therapy in both iliac arteries and the distal aorta across the aortic bifurcation with a 90 cm total length 20 cm working length catheter --10/22-- patient underwent urgent right lower extremity anterior compartment fascia out to me. Surgery went well according to dr Baruch Gouty. There was no  necrosis of muscle. IV Aggrastat to be started today. -- Patient does not have left lower extremity pulses. Vascular seems patient is too high risk for any of the procedure given comorbidities and age. This was discussed at length with patient's sister [name] and her husband over the phone they do understand patient's overall condition. -- Palliative care consulted. -- PRN IV pain meds --10/23-- patient is significant amount of pain and remains intermittently confused.  --Dr Baruch Gouty also discussed overall poor prognosis with patient given bilateral loss of pulses in both lower extremity -- Discussed at length with patient's sister Ms. [name] and few other family members and their goal is to keep patient comfort care and focus on her pain management. They are in agreement to start IV morphine drip if current IV and PO pain meds do not help her. Hospice discussion was made. -- TOC for hospice referral  --10/23-- patient is alert and oriented times two. More coherent. Patient sisters in the room. Discussed plan with sisters. Discontinue comfort measures. Will place patient on Dilaudid PCI pump. Will have PT/OT see patient. TOC for discharge planning -- patient was seen by vascular surgery. Wound VAC change. Tissue looks healthy. --10/25-- two of the patient's sisters want patient to be now back on comfort care. Discussed with hospital liaison Sonia Baller who will see patient. Will place comfort care orders with IV morphine drip. PRN IV Ativan for anxiety agitation. Patient will transfer to hospice facility once bed available.   COPD (chronic obstructive pulmonary disease) ([place]): stable -As needed albuterol   GERD   acute on CKD (chronic kidney disease), stage IIIa: Stable.  Creatinine 0.92, BUN 24-- patient with dark urine suspicion for myoglobin urea given compartment syndrome. -- Patient to continue oral intake of fluids.  -creat upto 1.89  HLD (hyperlipidemia): P  Acute metabolic  encephalopathy/?cognitive decline at basline  --CT of head-- unremarkable  p patient is transition to comfort care. Patient will transfer to hospice facility when bed available. Discussed with hospital liaison Loren Racer. This was earlier discussed with patient's 2 sisters at bedside.   Procedures: right lower extremity angiogram, right anterior compartment fascia alchemy Family communication : sisters  at bedside  ICU consults : vascular, nephrology CODE STATUS: DNR DVT Prophylaxis : heparin Level of care: Med-Surg Status is: Inpatient      TOTAL TIME TAKING CARE OF THIS PATIENT: 25 minutes.  >50% time spent on counselling and coordination of care  Note: This dictation was prepared with Dragon dictation along with smaller phrase technology. Any transcriptional errors that result from this process are unintentional.  Fritzi Mandes M.D    [facility]   CC: Primary care physician; Pccm, Ander Gaster, MD

## 2021-02-16 NOTE — Progress Notes (Signed)
[facility]  PROGRESS NOTE   Subjective:   Patient was placed on comfort care, but removed with improved prognosis. We were asked to assess for worsened renal function.  Patient seen at bedside, also working with therapy Per RN, pt given ativan overnight for agitation and remains drowsy, but mentation has improved since morning. Unable to participate in visit today. Currently NPO, but swallowing being assessed.   Objective:  Vital signs in last 24 hours:  Temp:  [97.9 F (36.6 C)-98.6 F (37 C)] 98.5 F (36.9 C) (10/25 0718) Pulse Rate:  [74-93] 89 (10/25 0718) Resp:  [12-17] 15 (10/25 0718) BP: (115-148)/(61-84) 148/74 (10/25 0718) SpO2:  [84 %-100 %] 87 % (10/25 0718)  Weight change:  Filed Weights   02/12/21 1817  Weight: 53.4 kg    Intake/Output: I/O last 3 completed shifts: In: 80.7 [I.V.:80.7] Out: 400 [Urine:400]   Intake/Output this shift:  Total I/O In: 0  Out: 750 [Urine:750]  Physical Exam: General:  No acute distress  Head:  Normocephalic, atraumatic. Moist oral mucosal membranes  Eyes:  Anicteric  Lungs:   Clear to auscultation, normal effort  Heart:  S1S2 no rubs  Abdomen:   Soft, nontender, bowel sounds present  Extremities: 1+ peripheral edema, NPWV RLE  Neurologic:  Awake, alert, not following commands  Skin:  No lesions       Basic Metabolic Panel: Recent Labs  Lab 02/12/21 0744 02/12/21 1847 02/13/21 0451 02/14/21 0526  NA 141 141 140 140  K 4.3 3.3* 5.1 3.8  CL 107 118* 111 112*  CO2 19* 16* 19* 17*  GLUCOSE 146* 121* 120* 241*  BUN 24* 16 24* 33*  CREATININE 0.92 0.67 1.03* 1.83*  CALCIUM 10.2 6.4* 8.8* 7.1*     CBC: Recent Labs  Lab 02/12/21 0744 02/12/21 1847 02/12/21 2340 02/13/21 0451 02/14/21 0526  WBC 15.7* 20.0* 28.6* 25.9* 17.0*  HGB 13.8 8.8* 11.7* 11.4* 8.2*  HCT 40.0 27.2* 35.1* 36.0 24.8*  MCV 90.1 95.8 93.6 94.2 95.4  PLT 282 172 227 239 159      Urinalysis: No results for input(s):  COLORURINE, LABSPEC, PHURINE, GLUCOSEU, HGBUR, BILIRUBINUR, KETONESUR, PROTEINUR, UROBILINOGEN, NITRITE, LEUKOCYTESUR in the last 72 hours.  Invalid input(s): APPERANCEUR     Imaging: No results found.   Medications:    sodium chloride Stopped (02/15/21 0130)   sodium chloride 75 mL/hr at 02/16/21 0825    Chlorhexidine Gluconate Cloth  6 each Topical Daily   famotidine  20 mg Oral QHS   heparin  5,000 Units Subcutaneous Q8H   sodium chloride flush  3 mL Intravenous Q12H   timolol  1 drop Both Eyes Daily    Assessment/ Plan:     Principal Problem:   Ischemia of right lower extremity Active Problems:   COPD (chronic obstructive pulmonary disease) (HCC)   GERD   Leukocytosis   CKD (chronic kidney disease), stage IIIa   HLD (hyperlipidemia)   Acute metabolic encephalopathy   Pressure injury of skin   Compartment syndrome of foot, right, initial encounter [facility])  Patient is a 87 y.o. female with a PMHx of hypertension, coronary artery disease, hyperlipidemia, COPD, GERD, depression, history of small bowel obstruction, bradycardia, dementia now with admitted with history of right lower extremity pain.  She is found to have severe ischemia of the right lower extremity was found to have a compartment syndrome.  Patient had fasciotomy done.  Renal evaluation is requested for elevated creatinine.   #1: Acute kidney injury:  Patient is acute kidney injury most likely due to prerenal azotemia complicated by possible acute tubular necrosis.  ATN may be secondary to myoglobinuric renal failure.  Patient removed from comfort care status yesterday afternoon. Creatinine has worsened. Agree with IVF and monitoring. Diet advanced as tolerated, encourage po intake.    #2: Compartment syndrome of right lower extremity: S/p fasciotomy.  Vascular note appreciated. NPWV in place       LOS: [facility] 10/25/202211:08 AM

## 2021-02-16 NOTE — Progress Notes (Signed)
[facility] Daily Progress Note  02/12/21:             1.  Aortogram and right lower extremity angiogram             2.  Catheter placement into right popliteal artery from left femoral approach             3.  Mechanical thrombectomy of right common femoral artery, profunda femoris artery, superficial femoral artery, and popliteal arteries with the penumbra CAT 7 device             4.  Viabahn stent placement to the right common femoral artery with 7 mm diameter by 5 cm length stent             5.  Lifestream stent placement to the left common iliac artery with 9 mm diameter by 58 mm length lifestream stent             6.  StarClose closure device left femoral artery   02/12/21:             1.  Ultrasound guidance for vascular access left femoral artery             2.  Catheter placement into right SFA from left femoral approach             3.  Aortogram and selective right lower extremity angiogram             4.  Catheter directed thrombolytic therapy with 6 mg of tPA to the right common femoral artery and superficial femoral artery             5.  Mechanical thrombectomy of the right external iliac artery, common femoral artery, superficial femoral artery, popliteal artery, tibioperoneal trunk, and profunda femoris artery with the penumbra CAT 7 catheter             6.  Percutaneous transluminal angioplasty of the right SFA with 4 mm diameter by 22 cm length and 4 mm diameter by 15 cm length Lutonix drug-coated angioplasty balloons             7.  Percutaneous transluminal angioplasty of right external iliac artery and most proximal common femoral artery with 6 mm diameter by 6 cm length Lutonix drug-coated angioplasty balloon             8.  Placement of infusion catheter continued thrombolytic therapy in both iliac arteries and the distal aorta across the aortic bifurcation with a 90 cm total length 20 cm working length catheter  Subjective: Patient nonverbal however  more alert today.  No acute issues overnight.  Sisters at bedside.  Objective: Vitals:   02/15/21 1622 02/15/21 2127 02/16/21 0446 02/16/21 0718  BP: (!) 147/84 134/66 130/71 (!) 148/74  Pulse: 74 90 93 89  Resp: 12 16 16 15   Temp: 98.2 F (36.8 C) 97.9 F (36.6 C) 98.1 F (36.7 C) 98.5 F (36.9 C)  TempSrc:   Oral Oral  SpO2: 100% (!) 84% 100% (!) 87%  Weight:        Intake/Output Summary (Last 24 hours) at 02/16/2021 1221 Last data filed at 02/16/2021 1021 Gross per 24 hour  Intake 0 ml  Output 750 ml  Net -750 ml   Physical Exam: A&Ox3, NAD CV: RRR Pulmonary: CTA Bilaterally Abdomen: Soft, Nontender, Nondistended Vascular:             Right lower extremity: Thigh soft.  Calf soft.  Calf is still very edematous.  VAC is intact and to suction without leak.  Foot is warm.   Laboratory: CBC    Component Value Date/Time   WBC 17.0 (H) 02/14/2021 0526   HGB 8.2 (L) 02/14/2021 0526   HCT 24.8 (L) 02/14/2021 0526   PLT 159 02/14/2021 0526   BMET    Component Value Date/Time   NA 140 02/14/2021 0526   NA 142 02/16/2016 0000   K 3.8 02/14/2021 0526   CL 112 (H) 02/14/2021 0526   CO2 17 (L) 02/14/2021 0526   GLUCOSE 241 (H) 02/14/2021 0526   BUN 33 (H) 02/14/2021 0526   BUN 14 02/16/2016 0000   CREATININE 1.83 (H) 02/14/2021 0526   CALCIUM 7.1 (L) 02/14/2021 0526   GFRNONAA 25 (L) 02/14/2021 0526   GFRAA >60 11/03/2018 0508   Assessment/Planning: The patient is a 87 year old female who presented to [facility] emergency department with an ischemic right lower extremity status post endovascular intervention and subsequent compartment syndrome requiring fasciotomy - POD#3   1) Patient's family has decided to reverse comfort care measures. 2) Operating room [facility] has been removed (Mon 10/24).  Fasciotomy sites are healthy with minimal drainage.  Still very edematous. 3) New VAC was placed.  Appreciate the assistance of the [facility] nurses.  We will  plan on VAC changes Mondays and Thursdays. 4) I had a long discussion with the patient's family members at the bedside.  We discussed the patient's ischemia and subsequent compartment syndrome needing fasciotomies.  Family members were present for the [facility] change and saw the wounds.  They also understand that she will need twice weekly dressing changes.  They also understand that once her edema has resolved she will need to return to the operating room for closure. 5) Next VAC change on Thursday.   Discussed with Dr. Ellis Parents Cashay Manganelli PA-C 02/16/2021 12:21 PM

## 2021-02-16 NOTE — Progress Notes (Addendum)
[facility]) Hospital Liaison Note   Received request for family interest in [place]. Visited patient at bedside and spoke with sister [name] to confirm interest and explain services. Patient chart and information reviewed by [facility] physician. Hospice Home eligibility confirmed.    Unfortunately, Hospice Home is not able to offer a room today. Family and [facility] Manager aware hospital liaison will follow up tomorrow or sooner if a room becomes available.    Please do not hesitate to call with any hospice related questions.    Thank you for the opportunity to participate in this patient's care.   Bobbie "Loren Racer, RN, BSN [facility] Liaison [phone]

## 2021-02-17 DIAGNOSIS — I998 Other disorder of circulatory system: Secondary | ICD-10-CM | POA: Diagnosis not present

## 2021-02-17 MED ORDER — CEFAZOLIN SODIUM-DEXTROSE 2-4 GM/100ML-% IV SOLN: 2.0000 g | INTRAVENOUS | Status: DC

## 2021-02-17 MED ORDER — LORAZEPAM 2 MG/ML PO CONC
1.0000 mg | ORAL | Status: DC | PRN
  Filled 2021-02-17: qty 0.5

## 2021-02-17 MED ORDER — HALOPERIDOL 0.5 MG PO TABS
0.5000 mg | ORAL_TABLET | ORAL | Status: DC | PRN
  Filled 2021-02-17: qty 1

## 2021-02-17 MED ORDER — LORAZEPAM 1 MG PO TABS: 1.0000 mg | ORAL_TABLET | ORAL | Status: DC | PRN

## 2021-02-17 MED ORDER — SODIUM CHLORIDE 0.9 % IV SOLN: INTRAVENOUS | Status: DC

## 2021-02-17 MED ORDER — LORAZEPAM 2 MG/ML IJ SOLN
1.0000 mg | INTRAMUSCULAR | Status: DC | PRN
  Administered 2021-02-18: 1 mg via INTRAVENOUS
  Filled 2021-02-17: qty 1

## 2021-02-17 MED ORDER — HALOPERIDOL LACTATE 2 MG/ML PO CONC
0.5000 mg | ORAL | Status: DC | PRN
  Filled 2021-02-17: qty 0.3

## 2021-02-17 MED ORDER — HALOPERIDOL LACTATE 5 MG/ML IJ SOLN: 0.5000 mg | INTRAMUSCULAR | Status: DC | PRN

## 2021-02-17 NOTE — H&P (View-Only) (Signed)
[facility]  Daily Progress Note   Subjective  -   Patient seems agitated this afternoon.  Says her right leg hurts.  Patient is going to hospice as per family  Objective Vitals:   02/17/21 0552 02/17/21 0723 02/17/21 1119 02/17/21 1513  BP: (!) 158/74 (!) 148/76 (!) 145/82 (!) 159/71  Pulse: 92 90 (!) 55 99  Resp: 16 16 15 14   Temp: (!) 97.5 F (36.4 C) 97.9 F (36.6 C) (!) 96.7 F (35.9 C) 98.5 F (36.9 C)  TempSrc: Oral     SpO2: 92% 100% 90% 99%  Weight:        Intake/Output Summary (Last 24 hours) at 02/17/2021 1540 Last data filed at 02/17/2021 0700 Gross per 24 hour  Intake 617.4 ml  Output 1200 ml  Net -582.6 ml    PULM  CTAB CV  RRR VASC  both legs are reasonably warm with good capillary refill, but she is insensate and cannot move her right foot due to prolonged ischemia prior to our involvement in her care  Laboratory CBC    Component Value Date/Time   WBC 17.0 (H) 02/14/2021 0526   HGB 8.2 (L) 02/14/2021 0526   HCT 24.8 (L) 02/14/2021 0526   PLT 159 02/14/2021 0526    BMET    Component Value Date/Time   NA 140 02/14/2021 0526   NA 142 02/16/2016 0000   K 3.8 02/14/2021 0526   CL 112 (H) 02/14/2021 0526   CO2 17 (L) 02/14/2021 0526   GLUCOSE 241 (H) 02/14/2021 0526   BUN 33 (H) 02/14/2021 0526   BUN 14 02/16/2016 0000   CREATININE 1.83 (H) 02/14/2021 0526   CALCIUM 7.1 (L) 02/14/2021 0526   GFRNONAA 25 (L) 02/14/2021 0526   GFRAA >60 11/03/2018 0508    Assessment/Planning: POD #5 s/p BLE revascularization  Patient is going to hospice which is certainly reasonable I will try to get her fasciotomies closed tomorrow so that she will not have any wound care once she is discharged   Leotis Pain  02/17/2021, 3:40 PM

## 2021-02-17 NOTE — Progress Notes (Signed)
[facility]) Hospital Liaison Note    Unfortunately, Hospice Home is not able to offer a room today. Family and [facility] Manager aware hospital liaison will follow up tomorrow or sooner if a room becomes available.    Please do not hesitate to call with any hospice related questions.    Thank you for the opportunity to participate in this patient's care.   Bobbie "Loren Racer, RN, BSN [facility] Services Liaison [phone]

## 2021-02-17 NOTE — Progress Notes (Signed)
PROGRESS NOTE    [name]  BMW:[id number] DOB: 1925/10/29 DOA: 02/12/2021 PCP: Pccm, [facility], MD  [id number]/[id number]-AA   Assessment & Plan:   Principal Problem:   Ischemia of right lower extremity Active Problems:   COPD (chronic obstructive pulmonary disease) (HCC)   GERD   Leukocytosis   CKD (chronic kidney disease), stage IIIa   HLD (hyperlipidemia)   Acute metabolic encephalopathy   Pressure injury of skin   Compartment syndrome of foot, right, initial encounter ([place])   [name] is a 87 y.o. female with medical history significant of hyperlipidemia, COPD, GERD, depression, urinary incontinence, small bowel obstruction, memory loss, bradycardia, UTI, who presents with right foot and left lower leg pain.  Per ED physician, pt was seen in ED due to right hip pain. She had x-ray of right hip which showed bilateral hip replacement, but was negative for acute issues. After went home, she developed severe pain in right foot and  right lower extremity.  Her sensation in right foot is decreased.  Patient was found to have cool, mottled and dusky right foot and right lower leg. No DP or PT pulse is palpated or dopplered in ED per EDP.   Comfort care status --on IV morphine gtt for pain control --oral intake if pt wishes --discharge to hospice facility when bed available  Ischemia of bilateral lower extremity Right lower leg compartment syndrome patient presented with critical ischemic limb in right lower extremity.  --10/21-- IV heparin was started in ED.   --s/p LE angiography and intervention with Dr. Lucky Cowboy on 10/21 --10/22-- patient underwent urgent right lower extremity anterior compartment fasciotomy with Dr. Feliberto Gottron -- Patient does not have left lower extremity pulses. Vascular deemed patient too high risk for any of the procedure given comorbidities and age. This was discussed at length with patient's sister [name] and her husband over the phone they do understand  patient's overall condition. --Dr Baruch Gouty also discussed overall poor prognosis with patient given bilateral loss of pulses in both lower extremity -- Discussed at length with patient's sister Ms. [name] and few other family members and their goal is to keep patient comfort care and focus on her pain management. They are in agreement to start IV morphine drip.  -- TOC for hospice referral --Vascular to plan for fasciotomy closure tomorrow   COPD (chronic obstructive pulmonary  --supplemental O2 for comfort   GERD --PPI d/c'ed since comfort care status  AKI on CKD (chronic kidney disease), stage IIIa: Stable.   Creatinine 0.92, BUN 24 on presentation.  -- patient with dark urine suspicion for myoglobin urea given compartment syndrome. --creat up to 1.83 --Patient to continue oral intake of fluids.  --not following Cr since comfort care status  HLD (hyperlipidemia):    Acute metabolic encephalopathy/?cognitive decline at basline  --CT of head-- unremarkable    DVT prophylaxis: None:Comfort Care Code Status: DNR  Family Communication: sister updated at bedside today Level of care: Med-Surg Dispo:   The patient is from: home Anticipated d/c is to: hospice facility Anticipated d/c date is: 1-2 days Patient currently is not medically ready to d/c due to: pending fasciotomy closure tomorrow   Subjective and Interval History:  Pain controlled.  Pt doesn't want to eat.   Objective: Vitals:   02/17/21 0552 02/17/21 0723 02/17/21 1119 02/17/21 1513  BP: (!) 158/74 (!) 148/76 (!) 145/82 (!) 159/71  Pulse: 92 90 (!) 55 99  Resp: 16 16 15 14   Temp: (!) 97.5 F (36.4  C) 97.9 F (36.6 C) (!) 96.7 F (35.9 C) 98.5 F (36.9 C)  TempSrc: Oral     SpO2: 92% 100% 90% 99%  Weight:        Intake/Output Summary (Last 24 hours) at 02/17/2021 1901 Last data filed at 02/17/2021 1711 Gross per 24 hour  Intake 159.04 ml  Output 900 ml  Net -740.96 ml   Filed Weights   02/12/21  1817  Weight: 53.4 kg    Examination:   Constitutional: NAD, alert, not oriented HEENT: conjunctivae and lids normal, EOMI CV: No cyanosis.   RESP: normal respiratory effort, on [place] Extremities: wound vac on RLE.   SKIN: warm, dry Neuro: II - XII grossly intact.     Data Reviewed: I have personally reviewed following labs and imaging studies  CBC: Recent Labs  Lab 02/12/21 0744 02/12/21 1847 02/12/21 2340 02/13/21 0451 02/14/21 0526  WBC 15.7* 20.0* 28.6* 25.9* 17.0*  HGB 13.8 8.8* 11.7* 11.4* 8.2*  HCT 40.0 27.2* 35.1* 36.0 24.8*  MCV 90.1 95.8 93.6 94.2 95.4  PLT 282 172 227 239 657   Basic Metabolic Panel: Recent Labs  Lab 02/12/21 0744 02/12/21 1847 02/13/21 0451 02/14/21 0526  NA 141 141 140 140  K 4.3 3.3* 5.1 3.8  CL 107 118* 111 112*  CO2 19* 16* 19* 17*  GLUCOSE 146* 121* 120* 241*  BUN 24* 16 24* 33*  CREATININE 0.92 0.67 1.03* 1.83*  CALCIUM 10.2 6.4* 8.8* 7.1*   GFR: Estimated Creatinine Clearance: 15.2 mL/min (A) (by C-G formula based on SCr of 1.83 mg/dL (H)). Liver Function Tests: Recent Labs  Lab 02/12/21 0744 02/12/21 2229 02/14/21 0526  AST 96*  --  447*  ALT 23  --  61*  ALKPHOS 53  --  45  BILITOT 1.1  --  0.6  PROT 7.8  --  5.2*  ALBUMIN 3.5 3.0* 2.4*   No results for input(s): LIPASE, AMYLASE in the last 168 hours. No results for input(s): AMMONIA in the last 168 hours. Coagulation Profile: Recent Labs  Lab 02/12/21 0744  INR 1.1   Cardiac Enzymes: No results for input(s): CKTOTAL, CKMB, CKMBINDEX, TROPONINI in the last 168 hours. BNP (last 3 results) No results for input(s): PROBNP in the last 8760 hours. HbA1C: No results for input(s): HGBA1C in the last 72 hours. CBG: Recent Labs  Lab 02/13/21 2121 02/14/21 0007 02/14/21 0046 02/14/21 0457 02/16/21 0755  GLUCAP 71 43* 100* 51* 81   Lipid Profile: No results for input(s): CHOL, HDL, LDLCALC, TRIG, CHOLHDL, LDLDIRECT in the last 72 hours. Thyroid Function  Tests: No results for input(s): TSH, T4TOTAL, FREET4, T3FREE, THYROIDAB in the last 72 hours. Anemia Panel: No results for input(s): VITAMINB12, FOLATE, FERRITIN, TIBC, IRON, RETICCTPCT in the last 72 hours. Sepsis Labs: No results for input(s): PROCALCITON, LATICACIDVEN in the last 168 hours.  Recent Results (from the past 240 hour(s))  Resp Panel by RT-PCR (Flu A&B, Covid) Nasopharyngeal Swab     Status: None   Collection Time: 02/12/21  7:46 AM   Specimen: Nasopharyngeal Swab; Nasopharyngeal(NP) swabs in vial transport medium  Result Value Ref Range Status   SARS Coronavirus 2 by RT PCR NEGATIVE NEGATIVE Final    Comment: (NOTE) SARS-CoV-2 target nucleic acids are NOT DETECTED.  The SARS-CoV-2 RNA is generally detectable in upper respiratory specimens during the acute phase of infection. The lowest concentration of SARS-CoV-2 viral copies this assay can detect is 138 copies/mL. A negative result does not preclude SARS-Cov-2  infection and should not be used as the sole basis for treatment or other patient management decisions. A negative result may occur with  improper specimen collection/handling, submission of specimen other than nasopharyngeal swab, presence of viral mutation(s) within the areas targeted by this assay, and inadequate number of viral copies(<138 copies/mL). A negative result must be combined with clinical observations, patient history, and epidemiological information. The expected result is Negative.  Fact Sheet for Patients:  EntrepreneurPulse.com.au  Fact Sheet for Healthcare Providers:  IncredibleEmployment.be  This test is no t yet approved or cleared by the [place] FDA and  has been authorized for detection and/or diagnosis of SARS-CoV-2 by FDA under an Emergency Use Authorization (EUA). This EUA will remain  in effect (meaning this test can be used) for the duration of the COVID-19 declaration under Section  564(b)(1) of the Act, 21 U.S.C.section 360bbb-3(b)(1), unless the authorization is terminated  or revoked sooner.       Influenza A by PCR NEGATIVE NEGATIVE Final   Influenza B by PCR NEGATIVE NEGATIVE Final    Comment: (NOTE) The Xpert Xpress SARS-CoV-2/FLU/RSV plus assay is intended as an aid in the diagnosis of influenza from Nasopharyngeal swab specimens and should not be used as a sole basis for treatment. Nasal washings and aspirates are unacceptable for Xpert Xpress SARS-CoV-2/FLU/RSV testing.  Fact Sheet for Patients: EntrepreneurPulse.com.au  Fact Sheet for Healthcare Providers: IncredibleEmployment.be  This test is not yet approved or cleared by the [place] FDA and has been authorized for detection and/or diagnosis of SARS-CoV-2 by FDA under an Emergency Use Authorization (EUA). This EUA will remain in effect (meaning this test can be used) for the duration of the COVID-19 declaration under Section 564(b)(1) of the Act, 21 U.S.C. section 360bbb-3(b)(1), unless the authorization is terminated or revoked.  Performed at [facility], [address] [place], [place] [address]   MRSA Next Gen by PCR, Nasal     Status: None   Collection Time: 02/12/21  6:27 PM   Specimen: Nasal Mucosa; Nasal Swab  Result Value Ref Range Status   MRSA by PCR Next Gen NOT DETECTED NOT DETECTED Final    Comment: (NOTE) The GeneXpert MRSA Assay (FDA approved for NASAL specimens only), is one component of a comprehensive MRSA colonization surveillance program. It is not intended to diagnose MRSA infection nor to guide or monitor treatment for MRSA infections. Test performance is not FDA approved in patients less than 93 years old. Performed at [facility], [address] [place], [place] [address]       Radiology Studies: No results found.   Scheduled Meds:  Chlorhexidine Gluconate Cloth  6 each Topical Daily    sodium chloride flush  3 mL Intravenous Q12H   Continuous Infusions:  sodium chloride Stopped (02/16/21 1552)   morphine Stopped (02/16/21 1552)     LOS: 5 days     Enzo Bi, MD [facility] If 7PM-7AM, please contact night-coverage 02/17/2021, 7:01 PM

## 2021-02-17 NOTE — TOC Progression Note (Signed)
Transition of Care [facility]) - Progression Note    Patient Details  Name: [name] MRN: [id number] Date of Birth: [birth year]  Transition of [facility]) CM/SW [place], RN Phone Number: 02/17/2021, 2:00 PM  Clinical Narrative:  RN working with patient says he was informed that there is no bed available for hospice home today. TOC to continue to track.       Barriers to Discharge: Continued Medical Work up  Expected Discharge Plan and Services   In-house Referral: Clinical Social Work   Post Acute Care Choice: Hospice Living arrangements for the past 2 months: Materials engineer ([facility])                                       Social Determinants of Health ([place]) Interventions    Readmission Risk Interventions No flowsheet data found.

## 2021-02-17 NOTE — Progress Notes (Signed)
[facility]  Daily Progress Note   Subjective  -   Patient seems agitated this afternoon.  Says her right leg hurts.  Patient is going to hospice as per family  Objective Vitals:   02/17/21 0552 02/17/21 0723 02/17/21 1119 02/17/21 1513  BP: (!) 158/74 (!) 148/76 (!) 145/82 (!) 159/71  Pulse: 92 90 (!) 55 99  Resp: 16 16 15 14   Temp: (!) 97.5 F (36.4 C) 97.9 F (36.6 C) (!) 96.7 F (35.9 C) 98.5 F (36.9 C)  TempSrc: Oral     SpO2: 92% 100% 90% 99%  Weight:        Intake/Output Summary (Last 24 hours) at 02/17/2021 1540 Last data filed at 02/17/2021 0700 Gross per 24 hour  Intake 617.4 ml  Output 1200 ml  Net -582.6 ml    PULM  CTAB CV  RRR VASC  both legs are reasonably warm with good capillary refill, but she is insensate and cannot move her right foot due to prolonged ischemia prior to our involvement in her care  Laboratory CBC    Component Value Date/Time   WBC 17.0 (H) 02/14/2021 0526   HGB 8.2 (L) 02/14/2021 0526   HCT 24.8 (L) 02/14/2021 0526   PLT 159 02/14/2021 0526    BMET    Component Value Date/Time   NA 140 02/14/2021 0526   NA 142 02/16/2016 0000   K 3.8 02/14/2021 0526   CL 112 (H) 02/14/2021 0526   CO2 17 (L) 02/14/2021 0526   GLUCOSE 241 (H) 02/14/2021 0526   BUN 33 (H) 02/14/2021 0526   BUN 14 02/16/2016 0000   CREATININE 1.83 (H) 02/14/2021 0526   CALCIUM 7.1 (L) 02/14/2021 0526   GFRNONAA 25 (L) 02/14/2021 0526   GFRAA >60 11/03/2018 0508    Assessment/Planning: POD #5 s/p BLE revascularization  Patient is going to hospice which is certainly reasonable I will try to get her fasciotomies closed tomorrow so that she will not have any wound care once she is discharged   Leotis Pain  02/17/2021, 3:40 PM

## 2021-02-18 ENCOUNTER — Encounter
Admission: EM | Disposition: A | Discharge status: Hospice | Payer: Self-pay | Source: Home / Self Care | Attending: Hospitalist

## 2021-02-18 ENCOUNTER — Encounter: Payer: Self-pay | Admitting: Anesthesiology

## 2021-02-18 DIAGNOSIS — G9341 Metabolic encephalopathy: Secondary | ICD-10-CM

## 2021-02-18 DIAGNOSIS — I4819 Other persistent atrial fibrillation: Secondary | ICD-10-CM

## 2021-02-18 DIAGNOSIS — I4891 Unspecified atrial fibrillation: Secondary | ICD-10-CM | POA: Diagnosis not present

## 2021-02-18 DIAGNOSIS — I998 Other disorder of circulatory system: Secondary | ICD-10-CM | POA: Diagnosis not present

## 2021-02-18 DIAGNOSIS — I7090 Unspecified atherosclerosis: Secondary | ICD-10-CM

## 2021-02-18 DIAGNOSIS — J449 Chronic obstructive pulmonary disease, unspecified: Secondary | ICD-10-CM | POA: Diagnosis not present

## 2021-02-18 SURGERY — FASCIOTOMY CLOSURE
Anesthesia: Choice | Laterality: Right

## 2021-02-18 SURGERY — FASCIOTOMY CLOSURE
Anesthesia: Choice

## 2021-02-18 MED ORDER — METOPROLOL TARTRATE 25 MG PO TABS
25.0000 mg | ORAL_TABLET | Freq: Two times a day (BID) | ORAL | Status: DC
  Administered 2021-02-18 – 2021-02-19 (×2): 25 mg via ORAL
  Filled 2021-02-18 (×2): qty 1

## 2021-02-18 MED ORDER — AMIODARONE HCL 200 MG PO TABS: 400.0000 mg | ORAL_TABLET | Freq: Three times a day (TID) | ORAL | Status: DC

## 2021-02-18 MED ORDER — PROPOFOL 10 MG/ML IV BOLUS
INTRAVENOUS | Status: AC
  Filled 2021-02-18: qty 20

## 2021-02-18 MED ORDER — AMIODARONE HCL 200 MG PO TABS
400.0000 mg | ORAL_TABLET | Freq: Three times a day (TID) | ORAL | Status: DC
  Administered 2021-02-18 – 2021-02-19 (×2): 400 mg via ORAL
  Filled 2021-02-18 (×2): qty 2

## 2021-02-18 MED ORDER — FENTANYL CITRATE (PF) 100 MCG/2ML IJ SOLN
INTRAMUSCULAR | Status: AC
  Filled 2021-02-18: qty 2

## 2021-02-18 MED ORDER — DILTIAZEM HCL 25 MG/5ML IV SOLN
10.0000 mg | Freq: Once | INTRAVENOUS | Status: AC
  Administered 2021-02-18: 10 mg via INTRAVENOUS
  Filled 2021-02-18: qty 5

## 2021-02-18 MED ORDER — METOPROLOL TARTRATE 25 MG PO TABS: 25.0000 mg | ORAL_TABLET | Freq: Two times a day (BID) | ORAL | Status: DC

## 2021-02-18 SURGICAL SUPPLY — 42 items
APL PRP STRL LF DISP 70% ISPRP (MISCELLANEOUS) ×1
BLADE SURG SZ10 CARB STEEL (BLADE) ×3 IMPLANT
BNDG CMPR STD VLCR NS LF 5.8X6 (GAUZE/BANDAGES/DRESSINGS) ×1
BNDG COHESIVE 4X5 TAN ST LF (GAUZE/BANDAGES/DRESSINGS) ×3 IMPLANT
BNDG ELASTIC 6X5.8 VLCR NS LF (GAUZE/BANDAGES/DRESSINGS) ×3 IMPLANT
BNDG GAUZE ELAST 4 BULKY (GAUZE/BANDAGES/DRESSINGS) ×6 IMPLANT
CANISTER WOUND CARE 500ML ATS (WOUND CARE) ×3 IMPLANT
CHLORAPREP W/TINT 26 (MISCELLANEOUS) ×3 IMPLANT
DRESSING SURGICEL FIBRLLR 1X2 (HEMOSTASIS) ×2 IMPLANT
DRSG GAUZE FLUFF 36X18 (GAUZE/BANDAGES/DRESSINGS) ×3 IMPLANT
DRSG SURGICEL FIBRILLAR 1X2 (HEMOSTASIS) ×2
DRSG VAC ATS MED SENSATRAC (GAUZE/BANDAGES/DRESSINGS) ×3 IMPLANT
ELECT CAUTERY BLADE 6.4 (BLADE) ×3 IMPLANT
ELECT REM PT RETURN 9FT ADLT (ELECTROSURGICAL) ×2
ELECTRODE REM PT RTRN 9FT ADLT (ELECTROSURGICAL) ×2 IMPLANT
GAUZE 4X4 16PLY ~~LOC~~+RFID DBL (SPONGE) ×3 IMPLANT
GAUZE XEROFORM 1X8 LF (GAUZE/BANDAGES/DRESSINGS) ×3 IMPLANT
GLOVE SURG ENC MOIS LTX SZ7 (GLOVE) ×3 IMPLANT
GLOVE SURG SYN 7.0 (GLOVE) ×2 IMPLANT
GLOVE SURG SYN 7.0 PF PI (GLOVE) ×1 IMPLANT
GLOVE SURG UNDER LTX SZ7.5 (GLOVE) ×3 IMPLANT
GOWN STRL REUS W/ TWL LRG LVL3 (GOWN DISPOSABLE) ×2 IMPLANT
GOWN STRL REUS W/ TWL XL LVL3 (GOWN DISPOSABLE) ×4 IMPLANT
GOWN STRL REUS W/TWL LRG LVL3 (GOWN DISPOSABLE) ×2
GOWN STRL REUS W/TWL XL LVL3 (GOWN DISPOSABLE) ×4
HANDLE YANKAUER SUCT BULB TIP (MISCELLANEOUS) ×3 IMPLANT
KIT TURNOVER KIT A (KITS) ×3 IMPLANT
MANIFOLD NEPTUNE II (INSTRUMENTS) ×3 IMPLANT
NS IRRIG 500ML POUR BTL (IV SOLUTION) ×3 IMPLANT
PACK EXTREMITY ARMC (MISCELLANEOUS) ×3 IMPLANT
PAD PREP 24X41 OB/GYN DISP (PERSONAL CARE ITEMS) ×3 IMPLANT
SPONGE T-LAP 18X18 ~~LOC~~+RFID (SPONGE) ×6 IMPLANT
STAPLER SKIN PROX 35W (STAPLE) ×3 IMPLANT
STOCKINETTE M/LG 89821 (MISCELLANEOUS) ×3 IMPLANT
SUT ETHIBOND 0 36 GRN (SUTURE) ×3 IMPLANT
SUT SILK 2 0 (SUTURE) ×2
SUT SILK 2-0 18XBRD TIE 12 (SUTURE) ×2 IMPLANT
SUT VIC AB 0 CT1 36 (SUTURE) ×15 IMPLANT
SUT VIC AB 3-0 SH 27 (SUTURE) ×8
SUT VIC AB 3-0 SH 27X BRD (SUTURE) ×8 IMPLANT
SUT VICRYL PLUS ABS 0 54 (SUTURE) ×3 IMPLANT
WATER STERILE IRR 500ML POUR (IV SOLUTION) ×3 IMPLANT

## 2021-02-18 NOTE — Consult Note (Signed)
Cardiology Consultation:   Patient ID: SUHANI STILLION; [id number]; [birth year]   Admit date: 02/12/2021 Date of Consult: 02/18/2021  Primary Care Provider: Pccm, Ander Gaster, MD Primary Cardiologist: Rockey Situ Primary Electrophysiologist:  None   Patient Profile:   [name] is a 87 y.o. female with a hx of HLD, COPD, GERD, urinary incontinence, SBO, memory loss, bradycardia, UTI, and depression who is being seen today for the evaluation of new onset Afib with RVR at the request of Dr. Rosey Bath.  History of Present Illness:   [name] has no previously known cardiac history. Over the past several weeks, leading up to her admission, she reported to her sister tachy-palpitations. She was admitted from her living facility on 02/12/2021 with severe right and left lower extremity pain with associated decreased sensation. Upon presentation, she was found to have an ischemic right lower extremity. In the ED, she was started on a heparin gtt and underwent lower extremity angiography with intervention by vascular surgery on 10/21 followed by an anterior compartment fasciotomy of the right lower extremity on 10/22. Dispo plans following admission are for her to be discharge to hospice and focus has been transitioned to comfort. With this transition, she was taken off telemetry and labs have stopped being collected. She presented back to the OR today with plans for closing of her fasciotomy and was noted to be in newly documented Afib with RVR with ventricular rates in the 150s bpm. Her case was cancelled and she was brought back to her room. She has been largely asymptomatic with this. At this time, she remains in Afib with RVR with ventricular rates in the 140s to 160s bpm with stable BP.    Past Medical History:  Diagnosis Date   Adenomatous colon polyp 04/26/95   tubulovillous   Allergy    Arthritis    Bradycardia    Bursitis    Cataracts, bilateral    Constipation    COPD,  moderate ([place])    "THIS WAS TOLD TO ME BACK IN THE DAYS WHEN EVERYONE THAT WAS  A SMOKER WAS TOLD THEY WERE A COPD. IM 90 AND I DONT GET SHORT OF BREATH , I DONT HAVE IT "   Cystitis    Degenerative joint disease    Depression    DENIES    Gastritis    GERD (gastroesophageal reflux disease)    Glaucoma    Hypoglycemia    Impaired memory    Insomnia    Lack of bladder control    Macular degeneration    OA (osteoarthritis)    Osteopenia    Renal cyst    right   Shortness of breath    Small bowel obstruction (HCC)    Trigeminal neuralgia    DENIES    Urinary incontinence    Wears dentures     Past Surgical History:  Procedure Laterality Date   ABDOMINAL HYSTERECTOMY  1992   nonmalignant reasons   APPLICATION OF WOUND VAC Right 02/13/2021   Procedure: APPLICATION OF WOUND VAC;  Surgeon: Elmore Guise, MD;  Location: [facility] ORS;  Service: Vascular;  Laterality: Right;  [id number]   BACK SURGERY  05/20/08   LUMBAR SPINAL FUSION    BREAST SURGERY     EXCISION OF CALCIUM DEPOSIT    CATARACT EXTRACTION Bilateral    CHOLECYSTECTOMY  1994   DILATION AND CURETTAGE OF UTERUS     EYE SURGERY     RIGHT EYE HEMORRHAGE SURGERY    FASCIOTOMY  Right 02/13/2021   Procedure: FASCIOTOMY Right lower extremity, all 4 compartments;  Surgeon: Elmore Guise, MD;  Location: [facility] ORS;  Service: Vascular;  Laterality: Right;   JOINT REPLACEMENT  2005   LEFT HIP    LOWER EXTREMITY ANGIOGRAPHY Right 02/12/2021   Procedure: Lower Extremity Angiography;  Surgeon: Algernon Huxley, MD;  Location: [place] [facility];  Service: Cardiovascular;  Laterality: Right;   LOWER EXTREMITY INTERVENTION Right 02/12/2021   Procedure: LOWER EXTREMITY INTERVENTION;  Surgeon: Algernon Huxley, MD;  Location: [place] [facility];  Service: Cardiovascular;  Laterality: Right;   PARTIAL COLECTOMY  04/26/95   tubulovillous adenoma   SHOULDER SURGERY  01/22/2009   left   TONSILLECTOMY     TOTAL HIP ARTHROPLASTY Right  10/04/2018   Procedure: TOTAL HIP ARTHROPLASTY ANTERIOR APPROACH;  Surgeon: Rod Can, MD;  Location: [facility];  Service: Orthopedics;  Laterality: Right;   TOTAL SHOULDER ARTHROPLASTY Right 07/05/2012   Procedure: RIGHT TOTAL SHOULDER ARTHROPLASTY;  Surgeon: Marin Shutter, MD;  Location: [facility];  Service: Orthopedics;  Laterality: Right;  Right total shoulder arthroplasty     Home Meds: Prior to Admission medications   Medication Sig Start Date End Date Taking? Authorizing Provider  beta carotene w/minerals (OCUVITE) tablet Take 1 tablet by mouth 2 (two) times daily.   Yes [provider]  conjugated estrogens (PREMARIN) vaginal cream Place 1 Applicatorful vaginally daily. Use pea sized amount M-W-Fr before bedtime 11/21/19  Yes Hollice Espy, MD  docusate sodium (COLACE) 100 MG capsule Take 1 capsule (100 mg total) by mouth 2 (two) times daily. 10/05/18  Yes Swinteck, Aaron Edelman, MD  famotidine (PEPCID) 20 MG tablet Take 20 mg by mouth at bedtime. 01/05/21  Yes [provider]  lidocaine (LIDODERM) 5 % Place 1 patch onto the skin every 12 (twelve) hours. Remove & Discard patch within 12 hours or as directed by MD 02/11/21 02/11/22 Yes Nance Pear, MD  morphine (MSIR) 15 MG tablet Take 15 mg by mouth 5 (five) times daily as needed. 01/18/21  Yes [provider]  omeprazole (PRILOSEC) 20 MG capsule Take 20 mg by mouth daily before breakfast.    Yes [provider]  timolol (TIMOPTIC) 0.5 % ophthalmic solution timolol maleate 0.5 % eye drops   Yes [provider]    Inpatient Medications: Scheduled Meds:  [MAR Hold] Chlorhexidine Gluconate Cloth  6 each Topical Daily   [MAR Hold] sodium chloride flush  3 mL Intravenous Q12H   Continuous Infusions:  [MAR Hold] sodium chloride Stopped (02/16/21 1552)   sodium chloride      ceFAZolin (ANCEF) IV     morphine Stopped (02/16/21 1552)   PRN Meds: [MAR Hold] sodium chloride, [MAR Hold] acetaminophen  **OR** [MAR Hold] acetaminophen, [MAR Hold] alum & mag hydroxide-simeth, [MAR Hold] haloperidol **OR** [MAR Hold] haloperidol **OR** [MAR Hold] haloperidol lactate, [MAR Hold]  HYDROmorphone (DILAUDID) injection, [MAR Hold] LORazepam **OR** [MAR Hold] LORazepam **OR** [MAR Hold] LORazepam, [MAR Hold] ondansetron, [MAR Hold] phenol, [MAR Hold] sodium chloride flush  Allergies:   Allergies  Allergen Reactions   Codeine Itching and Nausea Only    Small amounts okay   Pregabalin Other (See Comments)    Confusion and hallucinations   Promethazine Hcl Other (See Comments)    Muscle cramps   Sulfonamide Derivatives Nausea Only    Social History:   Social History   Socioeconomic History   Marital status: Widowed    Spouse name: Not on file   Number  of children: Not on file   Years of education: Not on file   Highest education level: Not on file  Occupational History   Not on file  Tobacco Use   Smoking status: Former    Packs/day: 1.00    Years: 50.00    Pack years: 50.00    Types: Cigarettes    Quit date: 06/28/2005    Years since quitting: 15.6   Smokeless tobacco: Never  Substance and Sexual Activity   Alcohol use: No   Drug use: No   Sexual activity: Never  Other Topics Concern   Not on file  Social History Narrative   Childbirth x 2   Retired Therapist, sports         Social Determinants of Radio broadcast assistant Strain: Not on file  Food Insecurity: Not on file  Transportation Needs: Not on file  Physical Activity: Not on file  Stress: Not on file  Social Connections: Not on file  Intimate Partner Violence: Not on file     Family History:   Family History  Problem Relation Age of Onset   Cancer Father        colon   Heart disease Sister    Colon polyps Brother    Diabetes Brother     ROS:  Review of Systems  Unable to perform ROS: Patient nonverbal     Physical Exam/Data:   Vitals:   02/18/21 0536 02/18/21 0742 02/18/21 1123 02/18/21 1505  BP: (!) 142/74  (!) 149/78 (!) 159/84 136/85  Pulse: 67 (!) 108 (!) 51 (!) 158  Resp: 16 16 16 18   Temp: 98.1 F (36.7 C) (!) 97.5 F (36.4 C) 98.2 F (36.8 C) (!) 97.3 F (36.3 C)  TempSrc: Oral Oral  Temporal  SpO2: 97% 100% 100% 98%  Weight:        Intake/Output Summary (Last 24 hours) at 02/18/2021 1528 Last data filed at 02/18/2021 0659 Gross per 24 hour  Intake 229.89 ml  Output 800 ml  Net -570.11 ml   Filed Weights   02/12/21 1817  Weight: 53.4 kg   Body mass index is 21.53 kg/m.   Physical Exam: General: Elderly and frail appearing, in no acute distress. Head: Normocephalic, atraumatic, sclera non-icteric, no xanthomas, nares without discharge.  Neck: Negative for carotid bruits. JVD not elevated. Lungs: Clear bilaterally to auscultation without wheezes, rales, or rhonchi. Breathing is unlabored. Poor inspiratory effort.  Heart: Tachycardic, irregular irregular with S1 S2. No murmurs, rubs, or gallops appreciated. Abdomen: Soft, non-tender, non-distended with normoactive bowel sounds. No hepatomegaly. No rebound/guarding. No obvious abdominal masses. Msk:  Strength and tone appear normal for age. Extremities: No clubbing or cyanosis.  Neuro: Alert. No facial asymmetry. No focal deficit. Moves all extremities spontaneously. Psych:  Alert, but not conversive.   EKG:  The EKG was personally reviewed and demonstrates: 10/21 - NSR, 90 bpm, right axis deviation, nonspecific st/t changes. 10/27 Afib with RVR, 151 bpm, right axis deviation, nonspecific  Telemetry:  Telemetry was personally reviewed and demonstrates: Not on telemetry  Weights: Filed Weights   02/12/21 1817  Weight: 53.4 kg    Relevant CV Studies:  None available for review  Laboratory Data:  Chemistry Recent Labs  Lab 02/12/21 1847 02/13/21 0451 02/14/21 0526  NA 141 140 140  K 3.3* 5.1 3.8  CL 118* 111 112*  CO2 16* 19* 17*  GLUCOSE 121* 120* 241*  BUN 16 24* 33*  CREATININE 0.67 1.03* 1.83*   CALCIUM  6.4* 8.8* 7.1*  GFRNONAA >60 51* 25*  ANIONGAP 7 10 11     Recent Labs  Lab 02/12/21 0744 02/12/21 2229 02/14/21 0526  PROT 7.8  --  5.2*  ALBUMIN 3.5 3.0* 2.4*  AST 96*  --  447*  ALT 23  --  61*  ALKPHOS 53  --  45  BILITOT 1.1  --  0.6   Hematology Recent Labs  Lab 02/12/21 2340 02/13/21 0451 02/14/21 0526  WBC 28.6* 25.9* 17.0*  RBC 3.75* 3.82* 2.60*  HGB 11.7* 11.4* 8.2*  HCT 35.1* 36.0 24.8*  MCV 93.6 94.2 95.4  MCH 31.2 29.8 31.5  MCHC 33.3 31.7 33.1  RDW 13.6 13.8 14.2  PLT 227 239 159   Cardiac EnzymesNo results for input(s): TROPONINI in the last 168 hours. No results for input(s): TROPIPOC in the last 168 hours.  BNPNo results for input(s): BNP, PROBNP in the last 168 hours.  DDimer No results for input(s): DDIMER in the last 168 hours.  Radiology/Studies:  No results found.  Assessment and Plan:   1. New onset Afib with RVR: -Asymptomatic  -She remains in Afib with RVR with rates in the 140s to 160s bpm -Defer echo and labs given plans for hospice at discharge with focus on comfort  -Unable to start amiodarone gtt on first floor, and in an effort to minimize discomfort for the patient and her family, will attempt rate control orally with oral amiodarone and Lopressor initially. If this is not successful, may need to consider starting amiodarone gtt and stopping oral amiodarone. This would require transfer to 2A -Defer anticoagulation given open fasciotomy, advanced age, fall risk, and hospice status  -Family is aware of stroke risk   Remaining noncardiac issues deferred to the primary service    Addendum since seeing the patient: -She has been made full comfort and is on a morphine gtt -Primary service is checking with hospice to see if patient can be on amiodarone and Lopressor at their facility    For questions or updates, please contact [facility] Please consult [email or web address] for contact info under Cardiology/STEMI.    Signed, Christell Faith, PA-C [place] Pager: [phone] 02/18/2021, 3:28 PM

## 2021-02-18 NOTE — Progress Notes (Signed)
PROGRESS NOTE    [name]  ZYS:[id number] DOB: 07-Apr-1926 DOA: 02/12/2021 PCP: [facility], [facility], MD  [id number]/[id number]-AA   Assessment & Plan:   Principal Problem:   Ischemia of right lower extremity Active Problems:   COPD (chronic obstructive pulmonary disease) (HCC)   GERD   Leukocytosis   CKD (chronic kidney disease), stage IIIa   HLD (hyperlipidemia)   Acute metabolic encephalopathy   Pressure injury of skin   Compartment syndrome of foot, right, initial encounter ([place])   [name] is a 87 y.o. female with medical history significant of hyperlipidemia, COPD, GERD, depression, urinary incontinence, small bowel obstruction, memory loss, bradycardia, UTI, who presents with right foot and left lower leg pain.  Per ED physician, pt was seen in ED due to right hip pain. She had x-ray of right hip which showed bilateral hip replacement, but was negative for acute issues. After went home, she developed severe pain in right foot and  right lower extremity.  Her sensation in right foot is decreased.  Patient was found to have cool, mottled and dusky right foot and right lower leg. No DP or PT pulse is palpated or dopplered in ED per EDP.   Comfort care status --cont IV morphine gtt --oral intake if pt wishes --discharge to hospice facility when bed available  Ischemia of bilateral lower extremity Right lower leg compartment syndrome patient presented with critical ischemic limb in right lower extremity.  --s/p LE angiography and intervention with Dr. Lucky Cowboy on 10/21 --10/22-- patient underwent urgent right lower extremity anterior compartment fasciotomy with Dr. Feliberto Gottron -- Patient does not have left lower extremity pulses. Vascular deemed patient too high risk for any of the procedure given comorbidities and age. This was discussed at length with patient's sister [name] and her husband over the phone they do understand patient's overall condition. --Dr Baruch Gouty also  discussed overall poor prognosis with patient given bilateral loss of pulses in both lower extremity -- Discussed at length with patient's sister Ms. [name] and few other family members and their goal is to keep patient comfort care and focus on her pain management. They are in agreement to start IV morphine drip.  --TOC for hospice referral --fasciotomy closure attempt today aborted due to Afib w RVR Plan: --will not re-attempt fasciotomy closure --hospice facility will not take wound vac, but will provide dry dressing over the fasciotomy wound  Afib w RVR --developed while getting prepped for fasciotomy closure today --cardiology consulted by anesthesiologist. Plan: --IV dilt 10 x1 --start amiodarone and lopressor by cardiology, per family's wish to treat  --hospice facility will not give amiodarone and lopressor, so will d/c prior to discharge. --No need for tele or transfer to PCU  COPD (chronic obstructive pulmonary  --supplemental O2 for comfort   GERD --PPI d/c'ed since comfort care status  AKI on CKD (chronic kidney disease), stage IIIa: Stable.   Creatinine 0.92, BUN 24 on presentation.  -- patient with dark urine suspicion for myoglobin urea given compartment syndrome. --creat up to 1.83 --Patient to continue oral intake of fluids.  --not following Cr since comfort care status  HLD (hyperlipidemia):    Acute metabolic encephalopathy/?cognitive decline at basline  --CT of head-- unremarkable    DVT prophylaxis: None:Comfort Care Code Status: DNR  Family Communication: family updated at bedside today Level of care: Med-Surg Dispo:   The patient is from: home Anticipated d/c is to: hospice facility Anticipated d/c date is: whenever bed available  Patient currently is medically  ready to d/c.   Subjective and Interval History:  During rounds today, pt's eyes were open but unresponsive.    Vascular attempted fasciotomy closure today, however, pt developed Afib w  RVR, so surgery aborted.  Anesthesiologist ordered cardiology consult, who saw pt for symptomatic management.   Objective: Vitals:   02/18/21 0742 02/18/21 1123 02/18/21 1505 02/18/21 1600  BP: (!) 149/78 (!) 159/84 136/85 124/77  Pulse: (!) 108 (!) 51 (!) 158 (!) 150  Resp: 16 16 18 18   Temp: (!) 97.5 F (36.4 C) 98.2 F (36.8 C) (!) 97.3 F (36.3 C) (!) 97.5 F (36.4 C)  TempSrc: Oral  Temporal Oral  SpO2: 100% 100% 98% 99%  Weight:        Intake/Output Summary (Last 24 hours) at 02/18/2021 1843 Last data filed at 02/18/2021 0659 Gross per 24 hour  Intake 229.89 ml  Output 300 ml  Net -70.11 ml   Filed Weights   02/12/21 1817  Weight: 53.4 kg    Examination:   Constitutional: eyes open, but unresponsive CV: No cyanosis.   RESP: normal respiratory effort, on [place] Extremities: wound vac over RLE   Data Reviewed: I have personally reviewed following labs and imaging studies  CBC: Recent Labs  Lab 02/12/21 0744 02/12/21 1847 02/12/21 2340 02/13/21 0451 02/14/21 0526  WBC 15.7* 20.0* 28.6* 25.9* 17.0*  HGB 13.8 8.8* 11.7* 11.4* 8.2*  HCT 40.0 27.2* 35.1* 36.0 24.8*  MCV 90.1 95.8 93.6 94.2 95.4  PLT 282 172 227 239 161   Basic Metabolic Panel: Recent Labs  Lab 02/12/21 0744 02/12/21 1847 02/13/21 0451 02/14/21 0526  NA 141 141 140 140  K 4.3 3.3* 5.1 3.8  CL 107 118* 111 112*  CO2 19* 16* 19* 17*  GLUCOSE 146* 121* 120* 241*  BUN 24* 16 24* 33*  CREATININE 0.92 0.67 1.03* 1.83*  CALCIUM 10.2 6.4* 8.8* 7.1*   GFR: Estimated Creatinine Clearance: 15.2 mL/min (A) (by C-G formula based on SCr of 1.83 mg/dL (H)). Liver Function Tests: Recent Labs  Lab 02/12/21 0744 02/12/21 2229 02/14/21 0526  AST 96*  --  447*  ALT 23  --  61*  ALKPHOS 53  --  45  BILITOT 1.1  --  0.6  PROT 7.8  --  5.2*  ALBUMIN 3.5 3.0* 2.4*   No results for input(s): LIPASE, AMYLASE in the last 168 hours. No results for input(s): AMMONIA in the last 168  hours. Coagulation Profile: Recent Labs  Lab 02/12/21 0744  INR 1.1   Cardiac Enzymes: No results for input(s): CKTOTAL, CKMB, CKMBINDEX, TROPONINI in the last 168 hours. BNP (last 3 results) No results for input(s): PROBNP in the last 8760 hours. HbA1C: No results for input(s): HGBA1C in the last 72 hours. CBG: Recent Labs  Lab 02/13/21 2121 02/14/21 0007 02/14/21 0046 02/14/21 0457 02/16/21 0755  GLUCAP 71 43* 100* 51* 81   Lipid Profile: No results for input(s): CHOL, HDL, LDLCALC, TRIG, CHOLHDL, LDLDIRECT in the last 72 hours. Thyroid Function Tests: No results for input(s): TSH, T4TOTAL, FREET4, T3FREE, THYROIDAB in the last 72 hours. Anemia Panel: No results for input(s): VITAMINB12, FOLATE, FERRITIN, TIBC, IRON, RETICCTPCT in the last 72 hours. Sepsis Labs: No results for input(s): PROCALCITON, LATICACIDVEN in the last 168 hours.  Recent Results (from the past 240 hour(s))  Resp Panel by RT-PCR (Flu A&B, Covid) Nasopharyngeal Swab     Status: None   Collection Time: 02/12/21  7:46 AM   Specimen: Nasopharyngeal Swab;  Nasopharyngeal(NP) swabs in vial transport medium  Result Value Ref Range Status   SARS Coronavirus 2 by RT PCR NEGATIVE NEGATIVE Final    Comment: (NOTE) SARS-CoV-2 target nucleic acids are NOT DETECTED.  The SARS-CoV-2 RNA is generally detectable in upper respiratory specimens during the acute phase of infection. The lowest concentration of SARS-CoV-2 viral copies this assay can detect is 138 copies/mL. A negative result does not preclude SARS-Cov-2 infection and should not be used as the sole basis for treatment or other patient management decisions. A negative result may occur with  improper specimen collection/handling, submission of specimen other than nasopharyngeal swab, presence of viral mutation(s) within the areas targeted by this assay, and inadequate number of viral copies(<138 copies/mL). A negative result must be combined  with clinical observations, patient history, and epidemiological information. The expected result is Negative.  Fact Sheet for Patients:  EntrepreneurPulse.com.au  Fact Sheet for Healthcare Providers:  IncredibleEmployment.be  This test is no t yet approved or cleared by the [place] FDA and  has been authorized for detection and/or diagnosis of SARS-CoV-2 by FDA under an Emergency Use Authorization (EUA). This EUA will remain  in effect (meaning this test can be used) for the duration of the COVID-19 declaration under Section 564(b)(1) of the Act, 21 U.S.C.section 360bbb-3(b)(1), unless the authorization is terminated  or revoked sooner.       Influenza A by PCR NEGATIVE NEGATIVE Final   Influenza B by PCR NEGATIVE NEGATIVE Final    Comment: (NOTE) The Xpert Xpress SARS-CoV-2/FLU/RSV plus assay is intended as an aid in the diagnosis of influenza from Nasopharyngeal swab specimens and should not be used as a sole basis for treatment. Nasal washings and aspirates are unacceptable for Xpert Xpress SARS-CoV-2/FLU/RSV testing.  Fact Sheet for Patients: EntrepreneurPulse.com.au  Fact Sheet for Healthcare Providers: IncredibleEmployment.be  This test is not yet approved or cleared by the [place] FDA and has been authorized for detection and/or diagnosis of SARS-CoV-2 by FDA under an Emergency Use Authorization (EUA). This EUA will remain in effect (meaning this test can be used) for the duration of the COVID-19 declaration under Section 564(b)(1) of the Act, 21 U.S.C. section 360bbb-3(b)(1), unless the authorization is terminated or revoked.  Performed at [facility], [address] [place], [place] [address]   MRSA Next Gen by PCR, Nasal     Status: None   Collection Time: 02/12/21  6:27 PM   Specimen: Nasal Mucosa; Nasal Swab  Result Value Ref Range Status   MRSA by PCR Next Gen  NOT DETECTED NOT DETECTED Final    Comment: (NOTE) The GeneXpert MRSA Assay (FDA approved for NASAL specimens only), is one component of a comprehensive MRSA colonization surveillance program. It is not intended to diagnose MRSA infection nor to guide or monitor treatment for MRSA infections. Test performance is not FDA approved in patients less than 41 years old. Performed at [facility], [address], [place], [place] [address]       Radiology Studies: No results found.   Scheduled Meds:  amiodarone  400 mg Oral Q8H   Chlorhexidine Gluconate Cloth  6 each Topical Daily   metoprolol tartrate  25 mg Oral BID   sodium chloride flush  3 mL Intravenous Q12H   Continuous Infusions:  sodium chloride Stopped (02/16/21 1552)   morphine 1.5 mg/hr (02/18/21 1726)     LOS: 6 days     Enzo Bi, MD [facility] If 7PM-7AM, please contact night-coverage 02/18/2021,  6:43 PM

## 2021-02-18 NOTE — Progress Notes (Signed)
[facility] ([facility])  There is not a bed at [facility] today.  [facility] will update the hospital and family once there is an open bed.  Venia Carbon BSN, RN [facility] Liaison

## 2021-02-18 NOTE — Care Management Important Message (Signed)
Important Message  Patient Details  Name: [name] MRN: [id number] Date of Birth: 07/01/1925   Medicare Important Message Given:  Other (see comment)  Patient is on comfort care with plans to transfer to the [facility] when a bed is available. Out of respect for the patient and family no Important Message from [facility] given.   Juliann Pulse A Willette Mudry 02/18/2021, 9:37 AM

## 2021-02-18 NOTE — Interval H&P Note (Signed)
History and Physical Interval Note:  02/18/2021 3:13 PM  [name]  has presented today for surgery, with the diagnosis of open fasciotomy.  The various methods of treatment have been discussed with the patient and family. After consideration of risks, benefits and other options for treatment, the patient has consented to  Procedure(s): FASCIOTOMY CLOSURE (Left) as a surgical intervention.  The patient's history has been reviewed, patient examined, no change in status, stable for surgery.  I have reviewed the patient's chart and labs.  Questions were answered to the patient's satisfaction.     Leotis Pain

## 2021-02-18 NOTE — OR Nursing (Signed)
Patient arrived to SDS with noted to have HR of 155-160 BPM. BP is stable, patient responsive to her name. Dr. Rosey Bath notifed. 12 lead EKG done. Patient noted to be in afib with rapid response. Dr. Rosey Bath reports surgery is postponed and he has ordered a cardiology consult to be performed on the floor. Patient is to be placed on tele when she arrives to the floor. Nursing supervisor Kennyth Lose notified and she reports to bring patient back to her room and they will place her on tele when she gets there. Morphine drip noted to be running at 1.5 mls per hour.

## 2021-02-18 NOTE — Consult Note (Signed)
[facility] Nurse Consult Note: NPWT dressings are not changed today as it is anticipated that patient will go to the OR for closure of the fasciotomy sites today with Dr. Lucky Cowboy.  [facility] nursing team will not follow, but will remain available to this patient, the nursing and medical teams.  Please re-consult if needed. Thanks, Maudie Flakes, MSN, RN, [place], Arther Abbott  Pager# [phone]

## 2021-02-18 NOTE — TOC Progression Note (Signed)
Transition of Care [facility]) - Progression Note    Patient Details  Name: [name] MRN: [id number] Date of Birth: [birth year]  Transition of Care [facility]) CM/SW [place], RN Phone Number: 02/18/2021, 9:47 AM  Clinical Narrative:   Spoke with [place], The patient is scheduled to go to OR today, they will likely have a Hospice bed for her tomorrow      Barriers to Discharge: Continued Medical Work up  Expected Discharge Plan and Services   In-house Referral: Clinical Social Work   Post Acute Care Choice: Hospice Living arrangements for the past 2 months: Materials engineer ([facility])                                       Social Determinants of Health ([place]) Interventions    Readmission Risk Interventions No flowsheet data found.

## 2021-02-19 DIAGNOSIS — I998 Other disorder of circulatory system: Secondary | ICD-10-CM | POA: Diagnosis not present

## 2021-02-19 NOTE — Progress Notes (Signed)
[facility]) Hospital Liaison Note  Hospice Home is able to offer a bed to patient today with request for transport at 2 pm. Family agreeable to transfer today. Bronson Ing, RN [facility] Manager aware.   RN please call report to [place] at [phone] prior to patient leaving the unit.  Please send signed and completed DNR with patient at discharge.   Please do not hesitate to call with any hospice related questions.    Thank you for the opportunity to participate in this patient's care.   Bobbie "Loren Racer, RN, BSN [facility] Liaison [phone]

## 2021-02-19 NOTE — Discharge Summary (Signed)
Physician Discharge Summary   [name]  female DOB: Oct 08, 1925  CHE:[id number]  PCP: Hoy Register, MD  Admit date: 02/12/2021 Discharge date: 02/19/2021  Admitted From: home Disposition:  hospice facility CODE STATUS: DNR  Discharge Instructions     Discharge wound care:   Complete by: As directed    Dry dressing change to the right leg wound. [facility] Course:  For full details, please see H&P, progress notes, consult notes and ancillary notes.  Briefly,  [name] is a 87 y.o. female with medical history significant of COPD, depression, urinary incontinence, small bowel obstruction, memory loss, bradycardia, who presented with right foot and left lower leg pain.  Per ED physician, pt was seen in ED due to right hip pain. She had x-ray of right hip which showed bilateral hip replacement, but was negative for acute issues. After went home, she developed severe pain in right foot and  right lower extremity.  Her sensation in right foot is decreased.  Patient was found to have cool, mottled and dusky right foot and right lower leg. No DP or PT pulse is palpated or dopplered in ED per EDP.   Comfort care status --received IV morphine gtt while inpatient.  After discharge, pain and anxiety management per hospice provider. --oral intake if pt wishes --Foley and IV left in after discharge, for comfort, and per hospice facility request.   Ischemia of bilateral lower extremity Right lower leg compartment syndrome patient presented with critical ischemic limb in right lower extremity.  --s/p LE angiography and intervention with Dr. Lucky Cowboy on 10/21 --10/22-- patient underwent urgent right lower extremity anterior compartment fasciotomy with Dr. Feliberto Gottron -- Patient does not have left lower extremity pulses. Vascular deemed patient too high risk for any of the procedure given comorbidities and age. This was discussed at length with patient's sister  [name] and her husband over the phone they do understand patient's overall condition. --Dr Baruch Gouty also discussed overall poor prognosis with patient given bilateral loss of pulses in both lower extremity -- Discussed at length with patient's sister Ms. [name] and few other family members and their goal is to keep patient comfort care and focus on her pain management. They are in agreement to start IV morphine drip.  --TOC for hospice referral --fasciotomy closure attempt on 10/27, then aborted due to Afib w RVR.  Will not re-attempt fasciotomy closure --hospice facility will not take wound vac, but will provide dry dressing over the fasciotomy wound.   Afib w RVR --developed while getting prepped for fasciotomy closure on 10/27 --cardiology consulted by anesthesiologist, and ordered amiodarone and lopressor for treatment, per family's wish. --rate controlled after IV dilt 10 x1 and oral  amiodarone and lopressor  --hospice facility will not give amiodarone and lopressor, so both d/c'ed prior to discharge.   COPD (chronic obstructive pulmonary  --supplemental O2 for comfort   GERD --PPI d/c'ed since comfort care status   AKI on CKD (chronic kidney disease), stage IIIa: Stable.   Creatinine 0.92, BUN 24 on presentation.   --patient with dark urine suspicion for myoglobin urea given compartment syndrome. --creat up to 1.83 --Patient to continue oral intake of fluids as tolerated. --not following Cr since comfort care status   HLD (hyperlipidemia):    Acute metabolic encephalopathy/?cognitive decline at basline  --CT of head unremarkable    Discharge Diagnoses:  Principal Problem:   Ischemia of right lower extremity Active Problems:  COPD (chronic obstructive pulmonary disease) (HCC)   GERD   Leukocytosis   CKD (chronic kidney disease), stage IIIa   HLD (hyperlipidemia)   Acute metabolic encephalopathy   Pressure injury of skin   Compartment syndrome of foot, right,  initial encounter ([place])   30 Day Unplanned Readmission Risk Score    Flowsheet Row ED to Hosp-Admission (Current) from 02/12/2021 in [place] (1A)  30 Day Unplanned Readmission Risk Score (%) 16.69 Filed at 02/19/2021 0801       This score is the patient's risk of an unplanned readmission within 30 days of being discharged (0 -100%). The score is based on dignosis, age, lab data, medications, orders, and past utilization.   Low:  0-14.9   Medium: 15-21.9   High: 22-29.9   Extreme: 30 and above         Discharge Instructions:  Allergies as of 02/19/2021       Reactions   Codeine Itching, Nausea Only   Small amounts okay   Pregabalin Other (See Comments)   Confusion and hallucinations   Promethazine Hcl Other (See Comments)   Muscle cramps   Sulfonamide Derivatives Nausea Only        Medication List     STOP taking these medications    beta carotene w/minerals tablet   docusate sodium 100 MG capsule Commonly known as: COLACE   famotidine 20 MG tablet Commonly known as: PEPCID   lidocaine 5 % Commonly known as: Lidoderm   morphine 15 MG tablet Commonly known as: MSIR   omeprazole 20 MG capsule Commonly known as: PRILOSEC   Premarin vaginal cream Generic drug: conjugated estrogens   timolol 0.5 % ophthalmic solution Commonly known as: TIMOPTIC               Discharge Care Instructions  (From admission, onward)           Start     Ordered   02/19/21 0000  Discharge wound care:       Comments: Dry dressing change to the right leg wound. - -   02/19/21 1104              Allergies  Allergen Reactions   Codeine Itching and Nausea Only    Small amounts okay   Pregabalin Other (See Comments)    Confusion and hallucinations   Promethazine Hcl Other (See Comments)    Muscle cramps   Sulfonamide Derivatives Nausea Only     The results of significant diagnostics from this hospitalization  (including imaging, microbiology, ancillary and laboratory) are listed below for reference.   Consultations:   Procedures/Studies: CT HEAD WO CONTRAST (5MM)  Result Date: 02/13/2021 CLINICAL DATA:  Mental status changes EXAM: CT HEAD WITHOUT CONTRAST TECHNIQUE: Contiguous axial images were obtained from the base of the skull through the vertex without intravenous contrast. COMPARISON:  Recent CT head 01/20/2021 FINDINGS: Brain: No evidence of acute infarction, hemorrhage, hydrocephalus, extra-axial collection or mass lesion/mass effect. Stable cortical and central atrophy with ex vacuo ventriculomegaly. Vascular: No hyperdense vessel or unexpected calcification. Skull: Normal. Negative for fracture or focal lesion. Sinuses/Orbits: No acute finding. Other: None. IMPRESSION: No acute intracranial abnormality. Electronically Signed   By: Jacqulynn Cadet M.D.   On: 02/13/2021 07:33   PERIPHERAL VASCULAR CATHETERIZATION  Result Date: 02/12/2021 See surgical note for result.  PERIPHERAL VASCULAR CATHETERIZATION  Result Date: 02/12/2021 See surgical note for result.  DG Hip Unilat W or Wo Pelvis 2-3 Views Right  Result Date: 02/11/2021 CLINICAL DATA:  No acute bony abnormality. EXAM: DG HIP (WITH OR WITHOUT PELVIS) 2-3V RIGHT COMPARISON:  None. FINDINGS: 11/01/2018 IMPRESSION: Bilateral hip replacements. No hardware complicating feature. No acute bony abnormality. Specifically, no fracture, subluxation, or dislocation. Electronically Signed   By: Rolm Baptise M.D.   On: 02/11/2021 17:17      Labs: BNP (last 3 results) No results for input(s): BNP in the last 8760 hours. Basic Metabolic Panel: Recent Labs  Lab 02/12/21 1847 02/13/21 0451 02/14/21 0526  NA 141 140 140  K 3.3* 5.1 3.8  CL 118* 111 112*  CO2 16* 19* 17*  GLUCOSE 121* 120* 241*  BUN 16 24* 33*  CREATININE 0.67 1.03* 1.83*  CALCIUM 6.4* 8.8* 7.1*   Liver Function Tests: Recent Labs  Lab 02/12/21 2229  02/14/21 0526  AST  --  447*  ALT  --  61*  ALKPHOS  --  45  BILITOT  --  0.6  PROT  --  5.2*  ALBUMIN 3.0* 2.4*   No results for input(s): LIPASE, AMYLASE in the last 168 hours. No results for input(s): AMMONIA in the last 168 hours. CBC: Recent Labs  Lab 02/12/21 1847 02/12/21 2340 02/13/21 0451 02/14/21 0526  WBC 20.0* 28.6* 25.9* 17.0*  HGB 8.8* 11.7* 11.4* 8.2*  HCT 27.2* 35.1* 36.0 24.8*  MCV 95.8 93.6 94.2 95.4  PLT 172 227 239 159   Cardiac Enzymes: No results for input(s): CKTOTAL, CKMB, CKMBINDEX, TROPONINI in the last 168 hours. BNP: Invalid input(s): POCBNP CBG: Recent Labs  Lab 02/13/21 2121 02/14/21 0007 02/14/21 0046 02/14/21 0457 02/16/21 0755  GLUCAP 71 43* 100* 51* 81   D-Dimer No results for input(s): DDIMER in the last 72 hours. Hgb A1c No results for input(s): HGBA1C in the last 72 hours. Lipid Profile No results for input(s): CHOL, HDL, LDLCALC, TRIG, CHOLHDL, LDLDIRECT in the last 72 hours. Thyroid function studies No results for input(s): TSH, T4TOTAL, T3FREE, THYROIDAB in the last 72 hours.  Invalid input(s): FREET3 Anemia work up No results for input(s): VITAMINB12, FOLATE, FERRITIN, TIBC, IRON, RETICCTPCT in the last 72 hours. Urinalysis    Component Value Date/Time   COLORURINE AMBER (A) 02/12/2021 1352   APPEARANCEUR CLOUDY (A) 02/12/2021 1352   LABSPEC >1.046 (H) 02/12/2021 1352   PHURINE 5.0 02/12/2021 1352   GLUCOSEU NEGATIVE 02/12/2021 1352   GLUCOSEU NEGATIVE 10/18/2019 1349   HGBUR LARGE (A) 02/12/2021 1352   HGBUR negative 10/09/2008 1339   BILIRUBINUR NEGATIVE 02/12/2021 1352   BILIRUBINUR neg 11/04/2019 1519   KETONESUR 5 (A) 02/12/2021 1352   PROTEINUR 100 (A) 02/12/2021 1352   UROBILINOGEN 1.0 11/04/2019 1519   UROBILINOGEN 0.2 10/18/2019 1349   NITRITE NEGATIVE 02/12/2021 1352   LEUKOCYTESUR NEGATIVE 02/12/2021 1352   Sepsis Labs Invalid input(s): PROCALCITONIN,  WBC,  LACTICIDVEN Microbiology Recent  Results (from the past 240 hour(s))  Resp Panel by RT-PCR (Flu A&B, Covid) Nasopharyngeal Swab     Status: None   Collection Time: 02/12/21  7:46 AM   Specimen: Nasopharyngeal Swab; Nasopharyngeal(NP) swabs in vial transport medium  Result Value Ref Range Status   SARS Coronavirus 2 by RT PCR NEGATIVE NEGATIVE Final    Comment: (NOTE) SARS-CoV-2 target nucleic acids are NOT DETECTED.  The SARS-CoV-2 RNA is generally detectable in upper respiratory specimens during the acute phase of infection. The lowest concentration of SARS-CoV-2 viral copies this assay can detect is 138 copies/mL. A negative result does not preclude SARS-Cov-2 infection and should  not be used as the sole basis for treatment or other patient management decisions. A negative result may occur with  improper specimen collection/handling, submission of specimen other than nasopharyngeal swab, presence of viral mutation(s) within the areas targeted by this assay, and inadequate number of viral copies(<138 copies/mL). A negative result must be combined with clinical observations, patient history, and epidemiological information. The expected result is Negative.  Fact Sheet for Patients:  EntrepreneurPulse.com.au  Fact Sheet for Healthcare Providers:  IncredibleEmployment.be  This test is no t yet approved or cleared by the [place] FDA and  has been authorized for detection and/or diagnosis of SARS-CoV-2 by FDA under an Emergency Use Authorization (EUA). This EUA will remain  in effect (meaning this test can be used) for the duration of the COVID-19 declaration under Section 564(b)(1) of the Act, 21 U.S.C.section 360bbb-3(b)(1), unless the authorization is terminated  or revoked sooner.       Influenza A by PCR NEGATIVE NEGATIVE Final   Influenza B by PCR NEGATIVE NEGATIVE Final    Comment: (NOTE) The Xpert Xpress SARS-CoV-2/FLU/RSV plus assay is intended as an aid in the  diagnosis of influenza from Nasopharyngeal swab specimens and should not be used as a sole basis for treatment. Nasal washings and aspirates are unacceptable for Xpert Xpress SARS-CoV-2/FLU/RSV testing.  Fact Sheet for Patients: EntrepreneurPulse.com.au  Fact Sheet for Healthcare Providers: IncredibleEmployment.be  This test is not yet approved or cleared by the [place] FDA and has been authorized for detection and/or diagnosis of SARS-CoV-2 by FDA under an Emergency Use Authorization (EUA). This EUA will remain in effect (meaning this test can be used) for the duration of the COVID-19 declaration under Section 564(b)(1) of the Act, 21 U.S.C. section 360bbb-3(b)(1), unless the authorization is terminated or revoked.  Performed at [facility], [address] [place], [place] [address]   MRSA Next Gen by PCR, Nasal     Status: None   Collection Time: 02/12/21  6:27 PM   Specimen: Nasal Mucosa; Nasal Swab  Result Value Ref Range Status   MRSA by PCR Next Gen NOT DETECTED NOT DETECTED Final    Comment: (NOTE) The GeneXpert MRSA Assay (FDA approved for NASAL specimens only), is one component of a comprehensive MRSA colonization surveillance program. It is not intended to diagnose MRSA infection nor to guide or monitor treatment for MRSA infections. Test performance is not FDA approved in patients less than 85 years old. Performed at [facility], [address] [place], [place] [address]      Total time spend on discharging this patient, including the last patient exam, discussing the hospital stay, instructions for ongoing care as it relates to all pertinent caregivers, as well as preparing the medical discharge records, prescriptions, and/or referrals as applicable, is 30 minutes.    Enzo Bi, MD  [facility] 02/19/2021, 11:04 AM

## 2021-03-03 ENCOUNTER — Encounter: Payer: Medicare Other | Admitting: Internal Medicine
